# Patient Record
Sex: Male | Born: 1945
Health system: Southern US, Community
[De-identification: ages and names within clinical notes are randomized; demographics above are authoritative.]

## PROBLEM LIST (undated history)

## (undated) DIAGNOSIS — C7B8 Other secondary neuroendocrine tumors: Secondary | ICD-10-CM

## (undated) DIAGNOSIS — K0889 Other specified disorders of teeth and supporting structures: Secondary | ICD-10-CM

## (undated) DIAGNOSIS — I2510 Atherosclerotic heart disease of native coronary artery without angina pectoris: Secondary | ICD-10-CM

## (undated) DIAGNOSIS — R110 Nausea: Secondary | ICD-10-CM

## (undated) DIAGNOSIS — I509 Heart failure, unspecified: Secondary | ICD-10-CM

## (undated) DIAGNOSIS — E3400 Carcinoid syndrome, unspecified: Secondary | ICD-10-CM

## (undated) DIAGNOSIS — I10 Essential (primary) hypertension: Secondary | ICD-10-CM

## (undated) DIAGNOSIS — M109 Gout, unspecified: Secondary | ICD-10-CM

## (undated) DIAGNOSIS — I639 Cerebral infarction, unspecified: Secondary | ICD-10-CM

## (undated) DIAGNOSIS — J40 Bronchitis, not specified as acute or chronic: Secondary | ICD-10-CM

## (undated) DIAGNOSIS — C787 Secondary malignant neoplasm of liver and intrahepatic bile duct: Secondary | ICD-10-CM

## (undated) DIAGNOSIS — R109 Unspecified abdominal pain: Secondary | ICD-10-CM

## (undated) HISTORY — PX: INGUINAL HERNIA REPAIR: SUR1180

## (undated) HISTORY — DX: Other specified disorders of teeth and supporting structures: K08.89

## (undated) HISTORY — DX: Unspecified abdominal pain: R10.9

## (undated) HISTORY — DX: Nausea: R11.0

## (undated) HISTORY — DX: Essential (primary) hypertension: I10

## (undated) HISTORY — DX: Carcinoid syndrome, unspecified: E34.00

## (undated) HISTORY — PX: SINUS SURGERY WITH INSTATRAK: SHX5215

## (undated) HISTORY — DX: Bronchitis, not specified as acute or chronic: J40

## (undated) HISTORY — DX: Carcinoid syndrome: E34.0

## (undated) HISTORY — PX: CARDIAC DEFIBRILLATOR PLACEMENT: SHX171

## (undated) HISTORY — DX: Secondary malignant neoplasm of liver and intrahepatic bile duct: C78.7

## (undated) HISTORY — DX: Other secondary neuroendocrine tumors: C7B.8

## (undated) HISTORY — DX: Atherosclerotic heart disease of native coronary artery without angina pectoris: I25.10

---

## 1978-03-18 HISTORY — PX: COLONOSCOPY: SHX174

## 1998-03-18 DIAGNOSIS — I2510 Atherosclerotic heart disease of native coronary artery without angina pectoris: Secondary | ICD-10-CM

## 1998-03-18 HISTORY — DX: Atherosclerotic heart disease of native coronary artery without angina pectoris: I25.10

## 1999-09-19 ENCOUNTER — Inpatient Hospital Stay (HOSPITAL_COMMUNITY): Admission: EM | Admit: 1999-09-19 | Discharge: 1999-09-21 | Payer: Self-pay | Admitting: *Deleted

## 2009-03-18 DIAGNOSIS — I639 Cerebral infarction, unspecified: Secondary | ICD-10-CM

## 2009-03-18 HISTORY — DX: Cerebral infarction, unspecified: I63.9

## 2012-04-12 ENCOUNTER — Encounter (HOSPITAL_COMMUNITY): Payer: Self-pay | Admitting: Emergency Medicine

## 2012-04-12 ENCOUNTER — Ambulatory Visit: Payer: Self-pay | Admitting: Family Medicine

## 2012-04-12 ENCOUNTER — Inpatient Hospital Stay (HOSPITAL_COMMUNITY): Payer: Medicaid Other

## 2012-04-12 ENCOUNTER — Emergency Department (HOSPITAL_COMMUNITY): Payer: Medicaid Other

## 2012-04-12 ENCOUNTER — Inpatient Hospital Stay (HOSPITAL_COMMUNITY)
Admission: EM | Admit: 2012-04-12 | Discharge: 2012-04-16 | DRG: 194 | Disposition: A | Discharge status: Home / Self Care | Payer: Medicaid Other | Attending: Internal Medicine | Admitting: Internal Medicine

## 2012-04-12 VITALS — BP 78/62 | HR 74 | Temp 99.1°F | Resp 30 | Ht 69.25 in | Wt 188.0 lb

## 2012-04-12 DIAGNOSIS — K5900 Constipation, unspecified: Secondary | ICD-10-CM | POA: Diagnosis not present

## 2012-04-12 DIAGNOSIS — K7689 Other specified diseases of liver: Secondary | ICD-10-CM | POA: Diagnosis present

## 2012-04-12 DIAGNOSIS — I959 Hypotension, unspecified: Secondary | ICD-10-CM

## 2012-04-12 DIAGNOSIS — N179 Acute kidney failure, unspecified: Secondary | ICD-10-CM

## 2012-04-12 DIAGNOSIS — I255 Ischemic cardiomyopathy: Secondary | ICD-10-CM

## 2012-04-12 DIAGNOSIS — J189 Pneumonia, unspecified organism: Principal | ICD-10-CM

## 2012-04-12 DIAGNOSIS — R791 Abnormal coagulation profile: Secondary | ICD-10-CM

## 2012-04-12 DIAGNOSIS — R160 Hepatomegaly, not elsewhere classified: Secondary | ICD-10-CM

## 2012-04-12 DIAGNOSIS — R7989 Other specified abnormal findings of blood chemistry: Secondary | ICD-10-CM

## 2012-04-12 DIAGNOSIS — E869 Volume depletion, unspecified: Secondary | ICD-10-CM

## 2012-04-12 DIAGNOSIS — D72829 Elevated white blood cell count, unspecified: Secondary | ICD-10-CM

## 2012-04-12 DIAGNOSIS — C7B8 Other secondary neuroendocrine tumors: Secondary | ICD-10-CM

## 2012-04-12 DIAGNOSIS — Z7901 Long term (current) use of anticoagulants: Secondary | ICD-10-CM

## 2012-04-12 DIAGNOSIS — I2589 Other forms of chronic ischemic heart disease: Secondary | ICD-10-CM | POA: Diagnosis present

## 2012-04-12 DIAGNOSIS — R1031 Right lower quadrant pain: Secondary | ICD-10-CM

## 2012-04-12 DIAGNOSIS — R9431 Abnormal electrocardiogram [ECG] [EKG]: Secondary | ICD-10-CM

## 2012-04-12 DIAGNOSIS — Z95810 Presence of automatic (implantable) cardiac defibrillator: Secondary | ICD-10-CM

## 2012-04-12 DIAGNOSIS — Z8673 Personal history of transient ischemic attack (TIA), and cerebral infarction without residual deficits: Secondary | ICD-10-CM

## 2012-04-12 DIAGNOSIS — I509 Heart failure, unspecified: Secondary | ICD-10-CM | POA: Diagnosis present

## 2012-04-12 DIAGNOSIS — Z79899 Other long term (current) drug therapy: Secondary | ICD-10-CM

## 2012-04-12 DIAGNOSIS — R197 Diarrhea, unspecified: Secondary | ICD-10-CM

## 2012-04-12 DIAGNOSIS — R0602 Shortness of breath: Secondary | ICD-10-CM | POA: Diagnosis present

## 2012-04-12 HISTORY — DX: Heart failure, unspecified: I50.9

## 2012-04-12 HISTORY — DX: Cerebral infarction, unspecified: I63.9

## 2012-04-12 LAB — CBC WITH DIFFERENTIAL/PLATELET
Basophils Absolute: 0 10*3/uL (ref 0.0–0.1)
Basophils Relative: 0 % (ref 0–1)
Eosinophils Absolute: 0 10*3/uL (ref 0.0–0.7)
Eosinophils Relative: 0 % (ref 0–5)
HCT: 39 % (ref 39.0–52.0)
Hemoglobin: 14.2 g/dL (ref 13.0–17.0)
Lymphocytes Relative: 6 % — ABNORMAL LOW (ref 12–46)
Lymphs Abs: 1.1 10*3/uL (ref 0.7–4.0)
MCH: 32.6 pg (ref 26.0–34.0)
MCHC: 36.4 g/dL — ABNORMAL HIGH (ref 30.0–36.0)
MCV: 89.7 fL (ref 78.0–100.0)
Monocytes Absolute: 3.5 10*3/uL — ABNORMAL HIGH (ref 0.1–1.0)
Monocytes Relative: 19 % — ABNORMAL HIGH (ref 3–12)
Neutro Abs: 14 10*3/uL — ABNORMAL HIGH (ref 1.7–7.7)
Neutrophils Relative %: 75 % (ref 43–77)
Platelets: 165 10*3/uL (ref 150–400)
RBC: 4.35 MIL/uL (ref 4.22–5.81)
RDW: 14.7 % (ref 11.5–15.5)
WBC: 18.6 10*3/uL — ABNORMAL HIGH (ref 4.0–10.5)

## 2012-04-12 LAB — URINALYSIS, ROUTINE W REFLEX MICROSCOPIC
Bilirubin Urine: NEGATIVE
Ketones, ur: NEGATIVE mg/dL
Nitrite: NEGATIVE
Urobilinogen, UA: 1 mg/dL (ref 0.0–1.0)

## 2012-04-12 LAB — COMPREHENSIVE METABOLIC PANEL
ALT: 48 U/L (ref 0–53)
AST: 75 U/L — ABNORMAL HIGH (ref 0–37)
CO2: 22 mEq/L (ref 19–32)
Chloride: 98 mEq/L (ref 96–112)
Creatinine, Ser: 2.54 mg/dL — ABNORMAL HIGH (ref 0.50–1.35)
GFR calc Af Amer: 29 mL/min — ABNORMAL LOW (ref >90)
GFR calc non Af Amer: 25 mL/min — ABNORMAL LOW (ref >90)
Glucose, Bld: 134 mg/dL — ABNORMAL HIGH (ref 70–99)
Total Bilirubin: 1.4 mg/dL — ABNORMAL HIGH (ref 0.3–1.2)

## 2012-04-12 LAB — URINE MICROSCOPIC-ADD ON

## 2012-04-12 LAB — LIPASE, BLOOD: Lipase: 17 U/L (ref 11–59)

## 2012-04-12 LAB — RAPID URINE DRUG SCREEN, HOSP PERFORMED
Amphetamines: NOT DETECTED
Barbiturates: NOT DETECTED
Benzodiazepines: NOT DETECTED
Cocaine: NOT DETECTED
Opiates: NOT DETECTED
Tetrahydrocannabinol: NOT DETECTED

## 2012-04-12 LAB — POCT I-STAT TROPONIN I: Troponin i, poc: 0.02 ng/mL (ref 0.00–0.08)

## 2012-04-12 LAB — PHOSPHORUS: Phosphorus: 3 mg/dL (ref 2.3–4.6)

## 2012-04-12 LAB — PRO B NATRIURETIC PEPTIDE: Pro B Natriuretic peptide (BNP): 771.7 pg/mL — ABNORMAL HIGH (ref 0–125)

## 2012-04-12 LAB — SODIUM, URINE, RANDOM: Sodium, Ur: 34 mEq/L

## 2012-04-12 LAB — PROTIME-INR: Prothrombin Time: 34.4 seconds — ABNORMAL HIGH (ref 11.6–15.2)

## 2012-04-12 MED ORDER — LEVOFLOXACIN IN D5W 750 MG/150ML IV SOLN: 750.0000 mg | INTRAVENOUS | Status: DC

## 2012-04-12 MED ORDER — ACETAMINOPHEN 325 MG PO TABS
650.0000 mg | ORAL_TABLET | Freq: Four times a day (QID) | ORAL | Status: DC | PRN
Start: 2012-04-12 — End: 2012-04-16
  Administered 2012-04-13: 650 mg via ORAL
  Filled 2012-04-12: qty 2

## 2012-04-12 MED ORDER — OSELTAMIVIR PHOSPHATE 75 MG PO CAPS
75.0000 mg | ORAL_CAPSULE | Freq: Every day | ORAL | Status: DC
  Administered 2012-04-12 – 2012-04-13 (×2): 75 mg via ORAL
  Filled 2012-04-12 (×3): qty 1

## 2012-04-12 MED ORDER — SODIUM CHLORIDE 0.9 % IV SOLN
INTRAVENOUS | Status: DC
  Administered 2012-04-12 (×2): via INTRAVENOUS

## 2012-04-12 MED ORDER — SODIUM CHLORIDE 0.9 % IV BOLUS (SEPSIS)
1000.0000 mL | Freq: Once | INTRAVENOUS | Status: AC
  Administered 2012-04-12: 1000 mL via INTRAVENOUS

## 2012-04-12 MED ORDER — SODIUM CHLORIDE 0.9 % IJ SOLN
3.0000 mL | Freq: Two times a day (BID) | INTRAMUSCULAR | Status: DC
  Administered 2012-04-13 – 2012-04-16 (×5): 3 mL via INTRAVENOUS

## 2012-04-12 MED ORDER — LEVOFLOXACIN IN D5W 750 MG/150ML IV SOLN
750.0000 mg | INTRAVENOUS | Status: DC
  Administered 2012-04-14: 750 mg via INTRAVENOUS
  Filled 2012-04-12: qty 150

## 2012-04-12 MED ORDER — ACETAMINOPHEN 650 MG RE SUPP: 650.0000 mg | Freq: Four times a day (QID) | RECTAL | Status: DC | PRN

## 2012-04-12 MED ORDER — BIOTENE DRY MOUTH MT LIQD
15.0000 mL | Freq: Two times a day (BID) | OROMUCOSAL | Status: DC
  Administered 2012-04-12 – 2012-04-16 (×7): 15 mL via OROMUCOSAL

## 2012-04-12 MED ORDER — ASPIRIN EC 81 MG PO TBEC
81.0000 mg | DELAYED_RELEASE_TABLET | Freq: Every day | ORAL | Status: DC
  Administered 2012-04-12 – 2012-04-16 (×5): 81 mg via ORAL
  Filled 2012-04-12 (×5): qty 1

## 2012-04-12 MED ORDER — ONDANSETRON HCL 4 MG PO TABS: 4.0000 mg | ORAL_TABLET | Freq: Four times a day (QID) | ORAL | Status: DC | PRN

## 2012-04-12 MED ORDER — LEVOFLOXACIN IN D5W 750 MG/150ML IV SOLN
750.0000 mg | Freq: Once | INTRAVENOUS | Status: AC
  Administered 2012-04-12: 750 mg via INTRAVENOUS
  Filled 2012-04-12: qty 150

## 2012-04-12 MED ORDER — ONDANSETRON HCL 4 MG/2ML IJ SOLN
4.0000 mg | Freq: Four times a day (QID) | INTRAMUSCULAR | Status: DC | PRN
  Administered 2012-04-14: 4 mg via INTRAVENOUS
  Filled 2012-04-12: qty 2

## 2012-04-12 NOTE — H&P (Signed)
Hospital Admission Note Date: 04/12/2012  Patient name: Jonathan Reed Medical record number: [id number] Date of birth: Apr 25, 1945 Age: 67 y.o. Gender: male PCP: Karle Plumber, MD  Medical Service: Internal Medicine Teaching Service  Attending physician:  Dr. Kem Kays   1st Contact:  Dr. Garald Braver  Pager:[phone] 2nd Contact:  Dr. Dorise Hiss  Pager:[phone] After 5 pm or weekends: 1st Contact:      Pager: [phone] 2nd Contact:      Pager: [phone]  Chief Complaint: Jonathan Reed, decreased appetite  History of Present Illness: Jonathan Reed is a 67 year-old man with PMH of CHF ischemic CM, s/p ICD and pacemaker, and CVA who comes in to the [facility] ED for evaluation of 5 days of malaise with decreased appetite. He states that he was in his usual state of health until Wednesday when he started feeling weak. He has progressively worsened with subjective fever, nausea, dizziness, and diarrhea. In addition he reports having a "funny feeling" in the epigastric area and dyspnea on exertion for the past 3-5 days. The diarrhea lasted for a few days with 1-2 episodes per day but subsided yesterday but he continued to have poor intake per mouth. He states that he has not been walking around for days now as he feels so tired. He reports being in contact with someone that had the flu. He did not receive the flu vaccine during this flu season. He was seen earlier today at the [facility] and was found to have BP of 78/62 and was sent to the [facility] ED via EMS after fluid resuscitation was started. His BP on his arrival to the ED was 104/79. He received additional IVF.  Of note, he is on spironolactone, metoprolol ER, and lisinopril-HCTZ at home and states that he has been taking all these medications the past days.   He denies cough, chest pain, dysuria, urinary retention, hematochezia, or melena.   Meds: Current Outpatient Rx  Name  Route  Sig  Dispense  Refill  . LISINOPRIL-HYDROCHLOROTHIAZIDE 20-25 MG PO TABS   Oral  Take 1 tablet by mouth daily.         Marland Kitchen METOPROLOL SUCCINATE ER 25 MG PO TB24   Oral   Take 25 mg by mouth daily.         Marland Kitchen SPIRONOLACTONE 25 MG PO TABS   Oral   Take 25 mg by mouth daily.         . WARFARIN SODIUM 5 MG PO TABS   Oral   Take 5 mg by mouth daily.           Allergies: Allergies as of 04/12/2012  . (No Known Allergies)   Past Medical History  Diagnosis Date  . CHF (congestive heart failure)   . CVA (cerebral infarction) 2011    No residual Sx  . Stroke 2011   History reviewed. No pertinent past surgical history. History reviewed. No pertinent family history. History   Social History  . Marital Status: Married    Spouse Name: N/A    Number of Children: N/A  . Years of Education: N/A   Occupational History  . Not on file.   Social History Main Topics  . Smoking status: Not on file  . Smokeless tobacco: Not on file  . Alcohol Use:   . Drug Use:   . Sexually Active:    Other Topics Concern  . Not on file   Social History Narrative  . No narrative on file    Review  of Systems: Pertinent items are noted in HPI.  Physical Exam: Blood pressure 102/63, pulse 84, temperature 99.3 F (37.4 C), temperature source Oral, resp. rate 24, SpO2 99.00%. Vitals reviewed. General: resting in bed, in NAD, his wife at his bedside HEENT: Wearing bifocals, PERRL, no scleral icterus Cardiac: irregular rhythm with occasionally dropped beats, no rubs, murmurs or gallops Chest: ICD implant noted on left chest Pulm: clear to auscultation bilaterally, no wheezes, rales, or rhonchi Abd: obese, soft, nontender, mildly distended, no guarding or rebound tenderness, normal BS present Ext: warm and well perfused, no pedal edema Neuro: alert and oriented X3, cranial nerves II-XII grossly intact, strength and sensation to light touch equal in bilateral upper and lower extremities  Lab results: Basic Metabolic Panel:  Basename 04/12/12 1254  NA 134*  K 5.0    CL 98  CO2 22  GLUCOSE 134*  BUN 49*  CREATININE 2.54*  CALCIUM 9.4  MG --  PHOS --   Liver Function Tests:  Basename 04/12/12 1254  AST 75*  ALT 48  ALKPHOS 179*  BILITOT 1.4*  PROT 7.8  ALBUMIN 3.0*    Basename 04/12/12 1254  LIPASE 17  AMYLASE --   CBC:  Basename 04/12/12 1254  WBC 18.6*  NEUTROABS 14.0*  HGB 14.2  HCT 39.0  MCV 89.7  PLT 165   CImaging results:  Dg Chest 2 View  04/12/2012  *RADIOLOGY REPORT*  Clinical Data: Weakness, fever  CHEST - 2 VIEW  Comparison: 04/12/2012  Findings: Cardiomediastinal silhouette is unremarkable.  Elevation of the right hemidiaphragm.  Three leads cardiac pacemaker in place.  No pulmonary edema.  There is right basilar atelectasis or infiltrate.  IMPRESSION: Elevation of the right hemidiaphragm.  Right basilar atelectasis or infiltrate. Follow-up examination to assure resolution is recommended.   Original Report Authenticated By: Natasha Mead, M.D.     Other results: EKG: Tachycardia with 100b/min, paced rhythm with PVCs noted   Assessment & Plan by Problem:  Shortness of breath. Likely multifactorial. His CXR is remarkable for right basilar atelectasis or infiltrate but no edema, however, concerning for PNA. In addition, he has leukocytosis and subjective fever further supporting the diagnosis of PNA. Influenza infection also a possibility given his lack of vaccination, recent exposure to someone with the flu, and his current symptoms. Clinically he does not appear volume overloaded, making CHF exacerbation less likely, but he does have a distended abdomen. His CXR is also remarkable for and elevated right hemidiaphragm and his LFTs are abnormal, liver failure with possible abdominal ascites is a possible etiology for his shortness of breath. ACS is also of concern given his hx of CAD and pacemaker/ICD implant but he denies chest pain, his EKG does not show ST changes and his ISTAT troponin is 0.02. Finally, PE is also considered  but less likely given that he is on chronic warfarin therapy.  -Admit to telemetry -pro-BNP ordered -PNA treatment per below -Influenza nasal PCR -Tamiflu (renally dosed) -Droplet precautions -Zofran PRN -UDS  Pneumonia, community acquired pneumonia. He has had subjective fever for days with decreased intake per mouth, his CXR shows right basilar atelectasis or infiltrate. Although his physical exam is benign, his symptoms at his presentation are concerning for CAP and he will be treated empirically. He has received IV Levaquin while in the ED.  -Continue IV levaquin while patient has nausea (dosed per pharmacy) -f/u blood cultures x2 -f/u sputum culture  -urine Strep pneumoniae and Legionella antigen  Leukocytosis. WBC of 18.6 on  admission. Likely 2/2 to CAP per above. He is afebrile but with temp of 99.11F in the ED.  -UA -Urine culture -CBC daily  Elevated LFTs. Alk phos of 179, AST 75, and t bili at 1.4 on admission. He denies drinking alcohol or using illicit drugs. He has not history of traveling outside the [place]. He has had poor intake per mouth, SOB, distended abdomen, anorexia, diarrhea which are symptoms concerning for acute hepatitis.  -Hepatitis panel -CMP in AM -PT/INR -Abdominal US, complete -HIV testing  Acute Renal Failure. Baseline creatinine unknown but creatinine on admission at 2.54 with BUN 49, bicarb 22, and K of 5.0. Most likely prerenal azotemia in the setting of creased intake per mouth for 5 days. He denies urinary retention of dysuria.  -Strict I&O -Abdomen US complete to include renal study -CBM in AM -Mg/phos levels ordered -IVF hydration with NS@100ml /hr -Urine Na and creatinine for FeNa calculation -Hgb A1C  Hypotension. BP of 78/62 in Urgent Care, up to 106/83 with 1L NS bolus. He is on metoprolol ER, lisinopril-HCTZ, and spironolactone at home and assures compliance with these medications even while sick.  -Hold all antihypertensives at this  time -IVF with   CHF/ischemic cardiomyopathy/ICD pacemaker. He does not appear volume overloaded on exam with no edema on CXR. No 2D echo on file, he has not seen a Cardiologist in years. He has PVCs on EKG but no active chest pain. He reports that his ICD pacemaker has not fired in years.  -Telemetry -Attempt to obtain records from PCP -Will consider 2D echo  Anticoagulation therapy. He is on coumadin which is managed by his PCP. It is unclear why he is on this therapy, he thinks it is related to his stroke years ago. His INR is 3.68.  -Continue warfarin per pharmacy--hold in the setting of supratherapeutic INR -PT/INR in AM -Will contact his PCP for anti-coagulation therapy clarification  DVT prophylaxis. Currently on coumadin with INR of 3.68  FEN: NS 176ml/hr          Replete as needed          Regular diet  Dispo: Disposition is deferred at this time, awaiting improvement of current medical problems. Anticipated discharge in approximately 2-3 day(s).   The patient does have a current PCP Karle Plumber, MD), therefore will not be requiring OPC follow-up after discharge.   The patient does not have transportation limitations that hinder transportation to clinic appointments.  Signed: Ky Barban 04/12/2012, 3:04 PM

## 2012-04-12 NOTE — ED Notes (Signed)
BIB [facility]. Patient presented at Urgent care [place] this am "did not feel well". CO n/v/d x2 days and abdominal pain. No complaints of pain at this time. No CP, SOB Transport: VSS, pacer rhythm w/trigemeny.

## 2012-04-12 NOTE — Progress Notes (Signed)
Pt arrived to unit from ED. Alert and oriented. No complaints of pain, just weakness and general malaise. Pt oriented to unit. VSS. Bed in low position. Call bell within reach. Will continue to monitor.

## 2012-04-12 NOTE — Progress Notes (Signed)
  Subjective:    Patient ID: Jonathan Reed, male    DOB: 1946-02-19, 67 y.o.   MRN: [id number]  HPI Jonathan Reed is a 67 y.o. male brought back emergently due to hypotension Unable to obtain blood pressure at check in. Manual BP at 1107 78/62 R arm.  Here with wife. Has not felt well past 3-4 days - dyspnea, weakness, decreased appetite and intake of food and fluids. Has had some lower R abdominal pain and diarrhea yesterday once after taking epsom salt as laxative few days ago.  Dry heaving since yesterday.  Less fluids.  No chest pains.  Unknown if fever - but sweating past 3 days. Dizzy.  Has taken his blood pressure medicines.   Hx of CHF and ischemic cardiomyopathy. Has defibrillator - but no discharges in past few days.   Review of Systems As above.     Objective:   Physical Exam  Vitals reviewed. Constitutional: He is oriented to person, place, and time. No distress.       Appears fatigued, but nontoxic or distress.   HENT:  Head: Normocephalic and atraumatic.  Eyes:       ?slight yellowing of sclera bilaterally.   Cardiovascular: Normal rate and normal pulses.   Occasional extrasystoles are present.       Rate 60-70, with intermittent ectopy. Distal pulses intact.   Pulmonary/Chest: Effort normal and breath sounds normal.       No appreciable rales, but slight tachypnea. bs heard in all lung fields.   Abdominal: Soft. He exhibits no distension, no abdominal bruit and no pulsatile midline mass (no palpable mass appreciated. ). There is tenderness in the right lower quadrant. There is no rigidity, no rebound and no guarding.  Neurological: He is alert and oriented to person, place, and time.  Skin: Skin is warm and dry.  Psychiatric: He has a normal mood and affect.  intial BP 78/62, recheck after IV in place - 100/78.  EKG: sr with frequent ectopy - every 1/3rd beat, old anterior infarct.      Assessment & Plan:  Jonathan Reed is a 67 y.o. male 1. Hypotension  EKG  12-Lead  2. Volume depletion    3. Abnormal EKG    4. Diarrhea    5. RLQ abdominal pain     Possible hypotension with volume depletion, dry heaving, shortness of breath by hx with hx of ischemic cardiomyopathy, and defibrillator. Abd pain, but no palpable pulsatile mass.   18 gauge IV placed with NS wide open, recheck BP 100/78, O2 [place] at 2 liters placed. Monitor placed, EMS called for transport, transition of care to EMS at 1118. Advised charge nurse at [facility] of patient en route.

## 2012-04-12 NOTE — Progress Notes (Signed)
ANTIBIOTIC CONSULT NOTE - INITIAL  Pharmacy Consult for levofloxacin Indication: rule out pneumonia  No Known Allergies  Patient Measurements: Height: 5' 9.29" (176 cm) Weight: 188 lb 0.8 oz (85.3 kg) IBW/kg (Calculated) : 71.37  Adjusted Body Weight:   Vital Signs: Temp: 98.2 F (36.8 C) (01/26 1546) Temp src: Oral (01/26 1546) BP: 108/64 mmHg (01/26 1546) Pulse Rate: 83  (01/26 1515) Intake/Output from previous day:   Intake/Output from this shift:    Labs:  Basename 04/12/12 1254  WBC 18.6*  HGB 14.2  PLT 165  LABCREA --  CREATININE 2.54*   Estimated Creatinine Clearance: 29.7 ml/min (by C-G formula based on Cr of 2.54). No results found for this basename: VANCOTROUGH:2,VANCOPEAK:2,VANCORANDOM:2,GENTTROUGH:2,GENTPEAK:2,GENTRANDOM:2,TOBRATROUGH:2,TOBRAPEAK:2,TOBRARND:2,AMIKACINPEAK:2,AMIKACINTROU:2,AMIKACIN:2, in the last 72 hours   Microbiology: No results found for this or any previous visit (from the past 720 hour(s)).  Medical History: Past Medical History  Diagnosis Date  . CHF (congestive heart failure)   . CVA (cerebral infarction) 2011    No residual Sx  . Stroke 2011    Medications:  Scheduled:    . aspirin EC  81 mg Oral Daily  . [COMPLETED] levofloxacin (LEVAQUIN) IV  750 mg Intravenous Once  . levofloxacin (LEVAQUIN) IV  750 mg Intravenous Q48H  . oseltamivir  75 mg Oral Daily  . [COMPLETED] sodium chloride  1,000 mL Intravenous Once  . sodium chloride  3 mL Intravenous Q12H  . [DISCONTINUED] levofloxacin (LEVAQUIN) IV  750 mg Intravenous Q24H   Assessment: 67 yr old male being treated empirically for CAP. He got levofloxacin 750 mg IV in the ED. Pharmacy to dose further for renal function. CrCl is 29 ml   Plan:  Levaquin 750 mg IV q48 hrs. Follow renal function and adjust as needed.   Eugene Garnet 04/12/2012,6:00 PM

## 2012-04-12 NOTE — ED Provider Notes (Signed)
Medical screening examination/treatment/procedure(s) were conducted as a shared visit with non-physician practitioner(s) and myself.  I personally evaluated the patient during the encounter.  Pt examined.  He appears comfortable at rest but has significant dyspnea with minimal activity.  Not diminished RA O2 sat also.  Pt will be admitted for further evaluation.  Tobin Chad, MD 04/12/12 1539

## 2012-04-12 NOTE — ED Provider Notes (Signed)
History     CSN: [id number]  Arrival date & time 04/12/12  1205   First MD Initiated Contact with Patient 04/12/12 1208      Chief Complaint  Patient presents with  . Fatigue    (Consider location/radiation/quality/duration/timing/severity/associated sxs/prior treatment) HPI Comments: 67 year old male presents to the emergency department complaining of "not feeling well" for the past 2 days. He went to [facility] urgent care earlier this morning and was advised to come to the emergency department for evaluation. Admits to bodyaches and pains, abdominal pain, nausea, vomiting and diarrhea. States he feels as if he had fever or chills. Admits to shortness of breath on exertion, however denies chest pain. States people around him of the flu. He did not have his flu vaccine this year. He has not tried any alleviating factors for his symptoms.  The history is provided by the patient.    Past Medical History  Diagnosis Date  . CHF (congestive heart failure)   . CVA (cerebral infarction) 2011    No residual Sx  . Stroke 2011    History reviewed. No pertinent past surgical history.  History reviewed. No pertinent family history.  History  Substance Use Topics  . Smoking status: Not on file  . Smokeless tobacco: Not on file  . Alcohol Use:       Review of Systems  Constitutional: Positive for fever, chills, appetite change and fatigue.  HENT: Negative for sore throat, neck pain and neck stiffness.   Respiratory: Positive for cough and shortness of breath.   Cardiovascular: Negative for chest pain.  Gastrointestinal: Positive for nausea, vomiting and diarrhea. Negative for abdominal pain.  Musculoskeletal: Positive for myalgias and arthralgias.  Skin: Negative for rash.  Neurological: Positive for weakness. Negative for dizziness, syncope and light-headedness.  All other systems reviewed and are negative.    Allergies  Review of patient's allergies indicates no known  allergies.  Home Medications   Current Outpatient Rx  Name  Route  Sig  Dispense  Refill  . LISINOPRIL-HYDROCHLOROTHIAZIDE 20-25 MG PO TABS   Oral   Take 1 tablet by mouth daily.         Marland Kitchen METOPROLOL SUCCINATE ER 25 MG PO TB24   Oral   Take 25 mg by mouth daily.         Marland Kitchen SPIRONOLACTONE 25 MG PO TABS   Oral   Take 25 mg by mouth daily.         . WARFARIN SODIUM 5 MG PO TABS   Oral   Take 5 mg by mouth daily.           BP 107/68  Temp 99.8 F (37.7 C) (Oral)  Resp 14  SpO2 98%  Physical Exam  Nursing note and vitals reviewed. Constitutional: He is oriented to person, place, and time. He appears well-developed and well-nourished. No distress. Nasal cannula in place.       Appears fatigued.  HENT:  Head: Normocephalic and atraumatic.  Mouth/Throat: Mucous membranes are normal. Posterior oropharyngeal erythema present. No oropharyngeal exudate or posterior oropharyngeal edema.  Eyes: Conjunctivae normal and EOM are normal. Pupils are equal, round, and reactive to light.  Neck: Normal range of motion. Neck supple.  Cardiovascular: Normal rate, regular rhythm, normal heart sounds and intact distal pulses.   Pulmonary/Chest: Effort normal. No respiratory distress. He has no decreased breath sounds. He has no wheezes. He has rhonchi in the right middle field and the right lower field. He has no rales.  Abdominal: Soft. Normal appearance and bowel sounds are normal. There is tenderness in the epigastric area. There is guarding. There is no rigidity and no rebound.  Musculoskeletal: Normal range of motion. He exhibits no edema.  Lymphadenopathy:    He has no cervical adenopathy.  Neurological: He is alert and oriented to person, place, and time.  Skin: Skin is warm and dry.  Psychiatric: He has a normal mood and affect. His behavior is normal.    ED Course  Procedures (including critical care time)  Labs Reviewed  CBC WITH DIFFERENTIAL - Abnormal; Notable for the  following:    WBC 18.6 (*)     MCHC 36.4 (*)     Neutro Abs 14.0 (*)     Lymphocytes Relative 6 (*)     Monocytes Relative 19 (*)     Monocytes Absolute 3.5 (*)     All other components within normal limits  COMPREHENSIVE METABOLIC PANEL - Abnormal; Notable for the following:    Sodium 134 (*)     Glucose, Bld 134 (*)     BUN 49 (*)     Creatinine, Ser 2.54 (*)     Albumin 3.0 (*)     AST 75 (*)     Alkaline Phosphatase 179 (*)     Total Bilirubin 1.4 (*)     GFR calc non Af Amer 25 (*)     GFR calc Af Amer 29 (*)     All other components within normal limits  LIPASE, BLOOD   Dg Chest 2 View  04/12/2012  *RADIOLOGY REPORT*  Clinical Data: Weakness, fever  CHEST - 2 VIEW  Comparison: 04/12/2012  Findings: Cardiomediastinal silhouette is unremarkable.  Elevation of the right hemidiaphragm.  Three leads cardiac pacemaker in place.  No pulmonary edema.  There is right basilar atelectasis or infiltrate.  IMPRESSION: Elevation of the right hemidiaphragm.  Right basilar atelectasis or infiltrate. Follow-up examination to assure resolution is recommended.   Original Report Authenticated By: Natasha Mead, M.D.      1. CAP (community acquired pneumonia)       MDM  67 y/o male with evidence of infiltrate in R lung consistent with physical exam where ronchi were present. WBC 18.6. IV levaquin started. O2 sat 90% on RA while walking around ED. PORT score 84 points. Initially tachypnea with resp of 30, decreasing to 17 with nasal cannula. He will be admitted to internal medicine teaching service. Case discussed with Dr. Lorenso Courier who agrees with plan of care.       Trevor Mace, PA-C 04/12/12 1415

## 2012-04-12 NOTE — Progress Notes (Signed)
ANTICOAGULATION CONSULT NOTE - Initial Consult  Pharmacy Consult for coumadin Indication: stroke  No Known Allergies  Patient Measurements: Height: 5' 9.29" (176 cm) Weight: 188 lb 0.8 oz (85.3 kg) IBW/kg (Calculated) : 71.37  Heparin Dosing Weight:   Vital Signs: Temp: 98.2 F (36.8 C) (01/26 1546) Temp src: Oral (01/26 1546) BP: 108/64 mmHg (01/26 1546) Pulse Rate: 83  (01/26 1515)  Labs:  Basename 04/12/12 1505 04/12/12 1254  HGB -- 14.2  HCT -- 39.0  PLT -- 165  APTT -- --  LABPROT 34.4* --  INR 3.68* --  HEPARINUNFRC -- --  CREATININE -- 2.54*  CKTOTAL -- --  CKMB -- --  TROPONINI -- --    Estimated Creatinine Clearance: 29.7 ml/min (by C-G formula based on Cr of 2.54).   Medical History: Past Medical History  Diagnosis Date  . CHF (congestive heart failure)   . CVA (cerebral infarction) 2011    No residual Sx  . Stroke 2011    Medications:  Scheduled:    . aspirin EC  81 mg Oral Daily  . [COMPLETED] levofloxacin (LEVAQUIN) IV  750 mg Intravenous Once  . levofloxacin (LEVAQUIN) IV  750 mg Intravenous Q48H  . oseltamivir  75 mg Oral Daily  . [COMPLETED] sodium chloride  1,000 mL Intravenous Once  . sodium chloride  3 mL Intravenous Q12H  . [DISCONTINUED] levofloxacin (LEVAQUIN) IV  750 mg Intravenous Q24H    Assessment:  67 yr old male sent to the ED by EMS from Urgent Care where he was seen for hypotension and weakness. He takes coumadin chronically for stroke. His INR is 3.68 today. He takes 5 mg tablets. Goal of Therapy:  INR 2-3    Plan:  No coumadin today (INR 3.68) Daily PT/INR  Eugene Garnet 04/12/2012,5:54 PM

## 2012-04-13 ENCOUNTER — Inpatient Hospital Stay (HOSPITAL_COMMUNITY): Payer: Medicaid Other

## 2012-04-13 DIAGNOSIS — R1900 Intra-abdominal and pelvic swelling, mass and lump, unspecified site: Secondary | ICD-10-CM

## 2012-04-13 LAB — COMPREHENSIVE METABOLIC PANEL
ALT: 42 U/L (ref 0–53)
AST: 57 U/L — ABNORMAL HIGH (ref 0–37)
CO2: 21 mEq/L (ref 19–32)
Chloride: 101 mEq/L (ref 96–112)
Creatinine, Ser: 1.66 mg/dL — ABNORMAL HIGH (ref 0.50–1.35)
GFR calc Af Amer: 49 mL/min — ABNORMAL LOW (ref >90)
GFR calc non Af Amer: 42 mL/min — ABNORMAL LOW (ref >90)
Glucose, Bld: 97 mg/dL (ref 70–99)
Total Bilirubin: 1 mg/dL (ref 0.3–1.2)

## 2012-04-13 LAB — CBC
MCH: 32.4 pg (ref 26.0–34.0)
MCHC: 36.2 g/dL — ABNORMAL HIGH (ref 30.0–36.0)
Platelets: 170 10*3/uL (ref 150–400)
RBC: 3.92 MIL/uL — ABNORMAL LOW (ref 4.22–5.81)

## 2012-04-13 LAB — LEGIONELLA ANTIGEN, URINE

## 2012-04-13 LAB — HEMOGLOBIN A1C: Mean Plasma Glucose: 140 mg/dL — ABNORMAL HIGH (ref <117)

## 2012-04-13 LAB — PROTIME-INR
INR: 5.85 (ref 0.00–1.49)
Prothrombin Time: 48.3 seconds — ABNORMAL HIGH (ref 11.6–15.2)

## 2012-04-13 LAB — HIV ANTIBODY (ROUTINE TESTING W REFLEX): HIV: NONREACTIVE

## 2012-04-13 MED ORDER — VITAMIN K1 10 MG/ML IJ SOLN
10.0000 mg | Freq: Once | INTRAVENOUS | Status: DC
  Filled 2012-04-13: qty 1

## 2012-04-13 MED ORDER — VITAMIN K1 1 MG/0.5ML IJ SOLN
0.5000 mg | Freq: Once | INTRAMUSCULAR | Status: DC
  Filled 2012-04-13: qty 0.5

## 2012-04-13 MED ORDER — METRONIDAZOLE IN NACL 5-0.79 MG/ML-% IV SOLN
500.0000 mg | Freq: Three times a day (TID) | INTRAVENOUS | Status: DC
  Administered 2012-04-13 – 2012-04-16 (×10): 500 mg via INTRAVENOUS
  Filled 2012-04-13 (×11): qty 100

## 2012-04-13 MED ORDER — IOHEXOL 300 MG/ML  SOLN: 100.0000 mL | Freq: Once | INTRAMUSCULAR | Status: AC | PRN

## 2012-04-13 MED ORDER — VITAMIN K1 10 MG/ML IJ SOLN
5.0000 mg | Freq: Once | INTRAVENOUS | Status: AC
  Administered 2012-04-13: 5 mg via INTRAVENOUS
  Filled 2012-04-13: qty 0.5

## 2012-04-13 MED ORDER — IOHEXOL 300 MG/ML  SOLN
25.0000 mL | INTRAMUSCULAR | Status: AC
  Administered 2012-04-13 (×2): 25 mL via ORAL

## 2012-04-13 NOTE — Progress Notes (Signed)
CRITICAL VALUE ALERT  Critical value received:  INR=5.8  Date of notification:  04/13/2012  Time of notification:  0601  Critical value read back:yes  Nurse who received alert:  Laurena Slimmer, RN  MD notified (1st page):  Dr. Garald Braver  Time of first page:  0601  MD notified (2nd page):  Time of second page:  Responding MD:  Dr. Earlene Plater  Time MD responded:  [phone]

## 2012-04-13 NOTE — Progress Notes (Addendum)
Subjective: He states that he still feels weak with decreased appetite and nausea. He denies shortness of breath, V/D.   Objective: Vital signs in last 24 hours: Filed Vitals:   04/12/12 1805 04/12/12 2034 04/13/12 0501 04/13/12 0940  BP: 110/68 118/87 99/57 100/71  Pulse: 91 68 90 100  Temp:  98 F (36.7 C) 99.4 F (37.4 C) 99.3 F (37.4 C)  TempSrc:  Oral Oral Oral  Resp:  18 20   Height:      Weight:  193 lb 2 oz (87.6 kg)    SpO2: 99% 95% 98%    Weight change:   Intake/Output Summary (Last 24 hours) at 04/13/12 1156 Last data filed at 04/13/12 1055  Gross per 24 hour  Intake   1700 ml  Output   1525 ml  Net    175 ml   Vitals reviewed.  General: resting in bed, in NAD, his wife at his bedside  HEENT: Wearing bifocals, PERRL, no scleral icterus  Cardiac: irregular rhythm with occasionally dropped beats, no rubs, murmurs or gallops  Chest: ICD implant noted on left chest  Pulm: clear to auscultation bilaterally, no wheezes, rales, or rhonchi  Abd: obese, soft, nontender, mildly distended, no guarding or rebound tenderness, normal BS present  Ext: warm and well perfused, no pedal edema  Neuro: alert and oriented X3, cranial nerves II-XII grossly intact, strength and sensation to light touch equal in bilateral upper and lower extremities   Lab Results: Basic Metabolic Panel:  Lab 04/13/12 1610 04/12/12 1717 04/12/12 1254  NA 133* -- 134*  K 4.5 -- 5.0  CL 101 -- 98  CO2 21 -- 22  GLUCOSE 97 -- 134*  BUN 43* -- 49*  CREATININE 1.66* -- 2.54*  CALCIUM 9.0 -- 9.4  MG -- 2.6* --  PHOS -- 3.0 --   Liver Function Tests:  Lab 04/13/12 0415 04/12/12 1254  AST 57* 75*  ALT 42 48  ALKPHOS 166* 179*  BILITOT 1.0 1.4*  PROT 7.2 7.8  ALBUMIN 2.7* 3.0*    Lab 04/12/12 1254  LIPASE 17  AMYLASE --   CBC:  Lab 04/13/12 0415 04/12/12 1254  WBC 15.6* 18.6*  NEUTROABS -- 14.0*  HGB 12.7* 14.2  HCT 35.1* 39.0  MCV 89.5 89.7  PLT 170 165   BNP:  Lab 04/12/12  1505  PROBNP 771.7*   Hemoglobin A1C:  Lab 04/12/12 1717  HGBA1C 6.5*   Coagulation:  Lab 04/13/12 0415 04/12/12 1505  LABPROT 48.3* 34.4*  INR 5.85* 3.68*   Urine Drug Screen: Drugs of Abuse     Component Value Date/Time   LABOPIA NONE DETECTED 04/12/2012 2049   COCAINSCRNUR NONE DETECTED 04/12/2012 2049   LABBENZ NONE DETECTED 04/12/2012 2049   AMPHETMU NONE DETECTED 04/12/2012 2049   THCU NONE DETECTED 04/12/2012 2049   LABBARB NONE DETECTED 04/12/2012 2049    Alcohol Level:  Lab 04/12/12 1505  ETH <11   Urinalysis:  Lab 04/12/12 2049  COLORURINE AMBER*  LABSPEC 1.025  PHURINE 5.0  GLUCOSEU NEGATIVE  HGBUR SMALL*  BILIRUBINUR NEGATIVE  KETONESUR NEGATIVE  PROTEINUR 30*  UROBILINOGEN 1.0  NITRITE NEGATIVE  LEUKOCYTESUR TRACE*     Micro Results: No results found for this or any previous visit (from the past 240 hour(s)). Studies/Results: Dg Chest 2 View  04/12/2012  *RADIOLOGY REPORT*  Clinical Data: Weakness, fever  CHEST - 2 VIEW  Comparison: 04/12/2012  Findings: Cardiomediastinal silhouette is unremarkable.  Elevation of the right hemidiaphragm.  Three  leads cardiac pacemaker in place.  No pulmonary edema.  There is right basilar atelectasis or infiltrate.  IMPRESSION: Elevation of the right hemidiaphragm.  Right basilar atelectasis or infiltrate. Follow-up examination to assure resolution is recommended.   Original Report Authenticated By: Natasha Mead, M.D.    US Abdomen Complete  04/12/2012  *RADIOLOGY REPORT*  Clinical Data:  67 year old male with abdominal pain and distention, elevated LFTs and acute renal failure.  ABDOMINAL ULTRASOUND COMPLETE  Comparison:  None  Findings:  Gallbladder: Gallbladder sludge is noted.  There is no evidence of gallstones, gallbladder wall thickening, or pericholecystic fluid.  Common Bile Duct:  There is no evidence of intrahepatic or extrahepatic biliary dilation. The CBD measures 2.1 mm in greatest diameter.  Liver: A 15.4 x 9.8  x 14.1 cm heterogeneous solid mass is identified within the abdomen with mass effect or origin within the porta hepatis/right liver.  There are several hyperechoic masses within the liver also identified which are slightly irregular.  IVC:  Appears normal.  Pancreas: The pancreas is not well visualized.  Spleen:  Within normal limits in size and echotexture.  Right kidney:  The right kidney is normal in size measuring 11.2 cm.  Upper limits of normal renal echogenicity noted.  Mild cortical atrophy is present.  There is no evidence of solid mass, hydronephrosis or definite renal calculi.  Left kidney:  The left kidney is normal in size measuring 12 cm. Upper limits of normal renal echogenicity noted.  There is no evidence of solid mass, hydronephrosis or definite renal calculi. Mild cortical atrophy is noted.  Abdominal Aorta:  No abdominal aortic aneurysm identified the abdominal aorta is difficult to fully visualize.  There is no evidence of ascites.  IMPRESSION: 15.4 x 9.8 x 14.1 cm mass situated in the region of the porta hepatis which involves originates from the liver. Hyperechoic hepatic lesions are suspicious for metastatic disease. Abdominal/pelvic CT with contrast is recommended for further evaluation.  Upper limits of normal renal echogenicity which may be related to medical renal disease.  No evidence of hydronephrosis.  Gallbladder sludge without evidence of cholelithiasis or cholecystitis.   Original Report Authenticated By: Harmon Pier, M.D.    Medications: I have reviewed the patient's current medications. Scheduled Meds:   . antiseptic oral rinse  15 mL Mouth Rinse BID  . aspirin EC  81 mg Oral Daily  . levofloxacin (LEVAQUIN) IV  750 mg Intravenous Q48H  . metroNIDAZOLE  500 mg Intravenous Q8H  . oseltamivir  75 mg Oral Daily  . sodium chloride  3 mL Intravenous Q12H   Continuous Infusions:  PRN Meds:.acetaminophen, acetaminophen, ondansetron (ZOFRAN) IV,  ondansetron Assessment/Plan:  Shortness of breath. Likely multifactorial. His CXR is remarkable for right basilar atelectasis or infiltrate but no edema, however, concerning for PNA. His CXR is also remarkable for and elevated right hemidiaphragm and his LFTs are abnormal, liver failure with possible abdominal ascites is a possible etiology for his shortness of breath. US abdomen reveals a large solid abdominal mass concerning for liver abscess v malignancy. His pro-BNP is only mildly elevate to ~700 and he does not appear to have CHF exacerbation clinically and per CXR. Influenza PCR NS negative.  -Continue telemetry  -PNA treatment per below  -Influenza nasal PCR  -Continued Tamiflu (renally dosed)  -Zofran PRN  -UDS pending  Pneumonia, community acquired pneumonia. He has had subjective fever for days with decreased intake per mouth, his CXR shows right basilar atelectasis or infiltrate. Although his physical  exam is benign, his symptoms at his presentation are concerning for CAP and he will be treated empirically. He has received IV Levaquin while in the ED. Urine Strep pneumoniae and Legionella antigen negative. -Continue IV levaquin while patient has nausea (dosed per pharmacy)  -f/u blood cultures x2  -f/u sputum culture   Intraabdominal mass.  His abdominal US shows a 15.4 x 9.8 x 14.1 cm heterogeneous solid mass within the abdomen with mass effect or origin within the porta hepatis/right liver. There are several hyperechoic masses within the liver also identified which are slightly irregular.  This is concerning for intraabdominal/liver abscess v malignancy.  -CT Abdomen ordered (w/out contrast 2/2 AKI) -Started flagyl -Possible IR drainage/bopsy tomorrow   Leukocytosis. WBC of 18.6 on admission. Likely 2/2 to CAP or liver abscess per above. He is afebrile but with temp of 99.10F in the ED. WBC of 15.6 today, he remains afebrile. UA with trace leucocytes, many bacteria, rare squamous  cells, concerning for UTI, althought he denies urinary symptoms.  -Continue Levaquin per above -f/u Urine culture  -CBC daily   Elevated LFTs. Alk phos of 179, AST 75, and t bili at 1.4 on admission. He denies drinking alcohol or using illicit drugs. He has not history of traveling outside the [place]. He has had poor intake per mouth, SOB, distended abdomen, anorexia, diarrhea. His hepatitis panel is negative, HIV also negative. He has intraabdominal mass concerning for liver abscess v malignancy per above.  Her has increased PT/INR today despite holding coumadin but his LFTs have improved slightly.  -CT abdomen and possible IR biopsy per above.   Acute Renal Failure. Baseline creatinine unknown but creatinine on admission at 2.54 with BUN 49, bicarb 22, and K of 5.0. Most likely prerenal azotemia in the setting of creased intake per mouth for 5 days. He denies urinary retention of dysuria. FeNa <1%.  Cr trended down to 1.66 today. His Mg is elevated at 2.6 (he reports recent use of Epson salts for constipation), phos normal at 3.0.  -Strict I&O  -CBM in AM  -IVF hydration with NS@100ml /hr   Hypotension. Stable. BP of 100/71 today. 78/62 in Urgent Care, up to 106/83 with 1L NS bolus. He is on metoprolol ER, lisinopril-HCTZ, and spironolactone at home and assures compliance with these medications even while sick.  -Hold all antihypertensives at this time  -IVF per above  DM2. Hgb A1C of 6.5%. He denies ever being diagnosed with DM2.  -CBG checks  -SSI sensitive   CHF/ischemic cardiomyopathy/ICD pacemaker. He does not appear volume overloaded on exam with no edema on CXR. No 2D echo on file, he has not seen a Cardiologist in years. He has PVCs on EKG but no active chest pain. He reports that his ICD pacemaker has not fired in years.  -Telemetry  -Attempt to obtain records from PCP  -Will consider 2D echo  -Will reassess volume status in AM  Anticoagulation therapy. He is on coumadin which is  managed by his PCP. It is unclear why he is on this therapy, he thinks it is related to his stroke years ago. His INR is 3.68 on admission but up to 5.85 despite holding warfarin.  -hold in the setting of supratherapeutic INR  -PT/INR in AM  -Will contact his PCP for anti-coagulation therapy clarification  -Vitamin K 10mg  IV once, pt will need INR<2 for IR procedure.   DVT prophylaxis. Currently on coumadin with INR of 5.85 today.   FEN: NS 15ml/hr  Replete  as needed  Regular diet   Dispo: Disposition is deferred at this time, awaiting improvement of current medical problems. Anticipated discharge in approximately 2-3 day(s).   The patient does have a current PCP Karle Plumber, MD), therefore will not be requiring OPC follow-up after discharge.  The patient does not have transportation limitations that hinder transportation to clinic appointments. .Services Needed at time of discharge: Y = Yes, Blank = No PT:   OT:   RN:   Equipment:   Other:     LOS: 1 day   Sara Chu D 04/13/2012, 11:56 AM

## 2012-04-13 NOTE — H&P (Signed)
INTERNAL MEDICINE TEACHING SERVICE Attending Admission Note  Date: 04/13/2012  Patient name: Jonathan Reed  Medical record number: [id number]  Date of birth: [birth year]    I have seen and evaluated Haze Justin and discussed their care with the Residency Team.  67 year old male with a past medical history significant for chronic systolic heart failure, ischemic cardiomyopathy, status post biventricular pacemaker/ICD, presented do to decreased appetite and malaise. He states last week he has felt ill and has had nausea, diarrhea, fever. He also states he's had some shortness of breath on exertion over the previous few days with associated diarrhea. He has not been able to keep up with oral intake. He states he may have had a sick contact with flu, has not had his vaccine. He was recently seen in urgent care and was found to have a blood pressure 78/62 and sent to the ED. His blood pressure improved with IV fluid resuscitation. Currently he admits to some nausea and some minimal epigastric tenderness. Chest x-ray findings concerning for underlying pneumonia. He had a leukocytosis of 18,000, a creatinine of 2.54, which has decreased to 1.66 this morning. He has a slightly elevated AST, and an elevated alkaline phosphatase. Ultrasound of the abdomen shows a 15.4 x 9.8 x 14.1 cm mass in the liver. He had additional lesions that are suspicious for metastatic disease. Has some evidence of renal echogenicity likely representative of underlying chronic kidney disease.  Physical Exam: Blood pressure 100/71, pulse 100, temperature 99.3 F (37.4 C), temperature source Oral, resp. rate 20, height 5' 9.29" (1.76 m), weight 193 lb 2 oz (87.6 kg), SpO2 98.00%.  General: Vital signs reviewed and noted. Well-developed, well-nourished, in no acute distress; alert, appropriate and cooperative throughout examination.  Head: Normocephalic, atraumatic.  Eyes: PERRL, EOMI, No signs of anemia or jaundince.  Nose:  Mucous membranes moist, not inflammed, nonerythematous.  Throat: Oropharynx nonerythematous, no exudate appreciated.   Neck: No deformities, masses, or tenderness noted.Supple, No carotid Bruits, no JVD.  Lungs:  Normal respiratory effort. Clear to auscultation BL without crackles or wheezes.  Heart:  paced rhythm, no JVD, no gallops, no rubs   Abdomen:   soft, minimal right upper quadrant tenderness, diffusely enlarged liver with no distinct palpable mass.   Extremities: No pretibial edema.  Neurologic: A&O X3, CN II - XII are grossly intact. Motor strength is 5/5 in the all 4 extremities, Sensations intact to light touch, Cerebellar signs negative.  Skin: No visible rashes, scars.    Lab results: Results for orders placed during the hospital encounter of 04/12/12 (from the past 24 hour(s))  PROTIME-INR     Status: Abnormal   Collection Time   04/12/12  3:05 PM      Component Value Range   Prothrombin Time 34.4 (*) 11.6 - 15.2 seconds   INR 3.68 (*) 0.00 - 1.49  PRO B NATRIURETIC PEPTIDE     Status: Abnormal   Collection Time   04/12/12  3:05 PM      Component Value Range   Pro B Natriuretic peptide (BNP) 771.7 (*) 0 - 125 pg/mL  ETHANOL     Status: Normal   Collection Time   04/12/12  3:05 PM      Component Value Range   Alcohol, Ethyl (B) <11  0 - 11 mg/dL  POCT I-STAT TROPONIN I     Status: Normal   Collection Time   04/12/12  3:23 PM      Component Value Range  Troponin i, poc 0.02  0.00 - 0.08 ng/mL   Comment 3           MAGNESIUM     Status: Abnormal   Collection Time   04/12/12  5:17 PM      Component Value Range   Magnesium 2.6 (*) 1.5 - 2.5 mg/dL  PHOSPHORUS     Status: Normal   Collection Time   04/12/12  5:17 PM      Component Value Range   Phosphorus 3.0  2.3 - 4.6 mg/dL  HEPATITIS PANEL, ACUTE     Status: Normal   Collection Time   04/12/12  5:17 PM      Component Value Range   Hepatitis B Surface Ag NEGATIVE  NEGATIVE   HCV Ab NEGATIVE  NEGATIVE   Hep A IgM  NEGATIVE  NEGATIVE   Hep B C IgM NEGATIVE  NEGATIVE  HEMOGLOBIN A1C     Status: Abnormal   Collection Time   04/12/12  5:17 PM      Component Value Range   Hemoglobin A1C 6.5 (*) <5.7 %   Mean Plasma Glucose 140 (*) <117 mg/dL  HIV ANTIBODY (ROUTINE TESTING)     Status: Normal   Collection Time   04/12/12  5:17 PM      Component Value Range   HIV NON REACTIVE  NON REACTIVE  INFLUENZA PANEL BY PCR     Status: Normal   Collection Time   04/12/12  5:39 PM      Component Value Range   Influenza A By PCR NEGATIVE  NEGATIVE   Influenza B By PCR NEGATIVE  NEGATIVE   H1N1 flu by pcr NOT DETECTED  NOT DETECTED  URINE RAPID DRUG SCREEN (HOSP PERFORMED)     Status: Normal   Collection Time   04/12/12  8:49 PM      Component Value Range   Opiates NONE DETECTED  NONE DETECTED   Cocaine NONE DETECTED  NONE DETECTED   Benzodiazepines NONE DETECTED  NONE DETECTED   Amphetamines NONE DETECTED  NONE DETECTED   Tetrahydrocannabinol NONE DETECTED  NONE DETECTED   Barbiturates NONE DETECTED  NONE DETECTED  URINALYSIS, ROUTINE W REFLEX MICROSCOPIC     Status: Abnormal   Collection Time   04/12/12  8:49 PM      Component Value Range   Color, Urine AMBER (*) YELLOW   APPearance CLOUDY (*) CLEAR   Specific Gravity, Urine 1.025  1.005 - 1.030   pH 5.0  5.0 - 8.0   Glucose, UA NEGATIVE  NEGATIVE mg/dL   Hgb urine dipstick SMALL (*) NEGATIVE   Bilirubin Urine NEGATIVE  NEGATIVE   Ketones, ur NEGATIVE  NEGATIVE mg/dL   Protein, ur 30 (*) NEGATIVE mg/dL   Urobilinogen, UA 1.0  0.0 - 1.0 mg/dL   Nitrite NEGATIVE  NEGATIVE   Leukocytes, UA TRACE (*) NEGATIVE  SODIUM, URINE, RANDOM     Status: Normal   Collection Time   04/12/12  8:49 PM      Component Value Range   Sodium, Ur 34    CREATININE, URINE, RANDOM     Status: Normal   Collection Time   04/12/12  8:49 PM      Component Value Range   Creatinine, Urine 184.28    LEGIONELLA ANTIGEN, URINE     Status: Normal   Collection Time   04/12/12  8:49  PM      Component Value Range   Specimen Description URINE, RANDOM  Special Requests NONE     Legionella Antigen, Urine Negative for Legionella pneumophilia serogroup 1     Report Status 04/13/2012 FINAL    STREP PNEUMONIAE URINARY ANTIGEN     Status: Normal   Collection Time   04/12/12  8:49 PM      Component Value Range   Strep Pneumo Urinary Antigen NEGATIVE  NEGATIVE  URINE MICROSCOPIC-ADD ON     Status: Abnormal   Collection Time   04/12/12  8:49 PM      Component Value Range   Squamous Epithelial / LPF RARE  RARE   WBC, UA 0-2  <3 WBC/hpf   RBC / HPF 0-2  <3 RBC/hpf   Bacteria, UA MANY (*) RARE   Casts GRANULAR CAST (*) NEGATIVE  COMPREHENSIVE METABOLIC PANEL     Status: Abnormal   Collection Time   04/13/12  4:15 AM      Component Value Range   Sodium 133 (*) 135 - 145 mEq/L   Potassium 4.5  3.5 - 5.1 mEq/L   Chloride 101  96 - 112 mEq/L   CO2 21  19 - 32 mEq/L   Glucose, Bld 97  70 - 99 mg/dL   BUN 43 (*) 6 - 23 mg/dL   Creatinine, Ser 1.61 (*) 0.50 - 1.35 mg/dL   Calcium 9.0  8.4 - 09.6 mg/dL   Total Protein 7.2  6.0 - 8.3 g/dL   Albumin 2.7 (*) 3.5 - 5.2 g/dL   AST 57 (*) 0 - 37 U/L   ALT 42  0 - 53 U/L   Alkaline Phosphatase 166 (*) 39 - 117 U/L   Total Bilirubin 1.0  0.3 - 1.2 mg/dL   GFR calc non Af Amer 42 (*) >90 mL/min   GFR calc Af Amer 49 (*) >90 mL/min  CBC     Status: Abnormal   Collection Time   04/13/12  4:15 AM      Component Value Range   WBC 15.6 (*) 4.0 - 10.5 K/uL   RBC 3.92 (*) 4.22 - 5.81 MIL/uL   Hemoglobin 12.7 (*) 13.0 - 17.0 g/dL   HCT 04.5 (*) 40.9 - 81.1 %   MCV 89.5  78.0 - 100.0 fL   MCH 32.4  26.0 - 34.0 pg   MCHC 36.2 (*) 30.0 - 36.0 g/dL   RDW 91.4  78.2 - 95.6 %   Platelets 170  150 - 400 K/uL  PROTIME-INR     Status: Abnormal   Collection Time   04/13/12  4:15 AM      Component Value Range   Prothrombin Time 48.3 (*) 11.6 - 15.2 seconds   INR 5.85 (*) 0.00 - 1.49    Imaging results:  Dg Chest 2 View  04/12/2012   *RADIOLOGY REPORT*  Clinical Data: Weakness, fever  CHEST - 2 VIEW  Comparison: 04/12/2012  Findings: Cardiomediastinal silhouette is unremarkable.  Elevation of the right hemidiaphragm.  Three leads cardiac pacemaker in place.  No pulmonary edema.  There is right basilar atelectasis or infiltrate.  IMPRESSION: Elevation of the right hemidiaphragm.  Right basilar atelectasis or infiltrate. Follow-up examination to assure resolution is recommended.   Original Report Authenticated By: Natasha Mead, M.D.    US Abdomen Complete  04/12/2012  *RADIOLOGY REPORT*  Clinical Data:  67 year old male with abdominal pain and distention, elevated LFTs and acute renal failure.  ABDOMINAL ULTRASOUND COMPLETE  Comparison:  None  Findings:  Gallbladder: Gallbladder sludge is noted.  There is no  evidence of gallstones, gallbladder wall thickening, or pericholecystic fluid.  Common Bile Duct:  There is no evidence of intrahepatic or extrahepatic biliary dilation. The CBD measures 2.1 mm in greatest diameter.  Liver: A 15.4 x 9.8 x 14.1 cm heterogeneous solid mass is identified within the abdomen with mass effect or origin within the porta hepatis/right liver.  There are several hyperechoic masses within the liver also identified which are slightly irregular.  IVC:  Appears normal.  Pancreas: The pancreas is not well visualized.  Spleen:  Within normal limits in size and echotexture.  Right kidney:  The right kidney is normal in size measuring 11.2 cm.  Upper limits of normal renal echogenicity noted.  Mild cortical atrophy is present.  There is no evidence of solid mass, hydronephrosis or definite renal calculi.  Left kidney:  The left kidney is normal in size measuring 12 cm. Upper limits of normal renal echogenicity noted.  There is no evidence of solid mass, hydronephrosis or definite renal calculi. Mild cortical atrophy is noted.  Abdominal Aorta:  No abdominal aortic aneurysm identified the abdominal aorta is difficult to fully  visualize.  There is no evidence of ascites.  IMPRESSION: 15.4 x 9.8 x 14.1 cm mass situated in the region of the porta hepatis which involves originates from the liver. Hyperechoic hepatic lesions are suspicious for metastatic disease. Abdominal/pelvic CT with contrast is recommended for further evaluation.  Upper limits of normal renal echogenicity which may be related to medical renal disease.  No evidence of hydronephrosis.  Gallbladder sludge without evidence of cholelithiasis or cholecystitis.   Original Report Authenticated By: Harmon Pier, M.D.      Assessment and Plan: I agree with the formulated Assessment and Plan with the following changes: 67 year old male with a past medical history significant for chronic systolic heart failure, ischemic cardiomyopathy, status post biventricular pacemaker/ICD, presented do to decreased appetite and malaise, with possible community-acquired pneumonia and finding of liver mass. #1 dyspnea:  Agree with treatment with Levaquin for now. Not certain that this represents pneumonia but his right lower lobe is difficult to see on x-ray the due to hepatic mass. Monitor on telemetry. Troponins so far negative. I would obtain one more troponin this patient. #2 liver mass: Obtain a CT of the abdomen without contrast. This could represent tumor versus abscess. I would add Flagyl to his regimen. #3 acute renal failure: His baseline creatinine is unknown but he likely has underlying chronic kidney disease. He likely is prerenal as shown by his improvement with IV fluids. Out of your careful with fluid resuscitation in this patient due to cardiac history. Watch volume status closely. Monitor urine output. I would hold his diuretics at this time. Depending on findings of CT scan, we may have to biopsy the liver mass versus abscess.  [name], DO 1/27/201412:57 PM

## 2012-04-14 DIAGNOSIS — C7B8 Other secondary neuroendocrine tumors: Secondary | ICD-10-CM

## 2012-04-14 DIAGNOSIS — J449 Chronic obstructive pulmonary disease, unspecified: Secondary | ICD-10-CM

## 2012-04-14 DIAGNOSIS — R791 Abnormal coagulation profile: Secondary | ICD-10-CM

## 2012-04-14 LAB — PROTIME-INR
INR: >10 (ref 0.00–1.49)
INR: 1.71 — ABNORMAL HIGH (ref 0.00–1.49)
Prothrombin Time: >90 seconds — ABNORMAL HIGH (ref 11.6–15.2)

## 2012-04-14 LAB — COMPREHENSIVE METABOLIC PANEL
AST: 49 U/L — ABNORMAL HIGH (ref 0–37)
Albumin: 2.7 g/dL — ABNORMAL LOW (ref 3.5–5.2)
Alkaline Phosphatase: 190 U/L — ABNORMAL HIGH (ref 39–117)
Chloride: 100 mEq/L (ref 96–112)
Potassium: 3.9 mEq/L (ref 3.5–5.1)
Sodium: 135 mEq/L (ref 135–145)
Total Bilirubin: 2.1 mg/dL — ABNORMAL HIGH (ref 0.3–1.2)
Total Protein: 7.1 g/dL (ref 6.0–8.3)

## 2012-04-14 LAB — URINE CULTURE: Culture: NO GROWTH

## 2012-04-14 LAB — CBC
Platelets: 196 10*3/uL (ref 150–400)
RDW: 15 % (ref 11.5–15.5)
WBC: 14 10*3/uL — ABNORMAL HIGH (ref 4.0–10.5)

## 2012-04-14 MED ORDER — LEVOFLOXACIN IN D5W 750 MG/150ML IV SOLN
750.0000 mg | INTRAVENOUS | Status: DC
  Filled 2012-04-14: qty 150

## 2012-04-14 MED ORDER — VITAMIN K1 10 MG/ML IJ SOLN
10.0000 mg | Freq: Once | INTRAVENOUS | Status: AC
  Administered 2012-04-14: 10 mg via INTRAVENOUS
  Filled 2012-04-14: qty 1

## 2012-04-14 MED ORDER — OXYCODONE-ACETAMINOPHEN 5-325 MG PO TABS
1.0000 | ORAL_TABLET | ORAL | Status: DC | PRN
  Administered 2012-04-14 – 2012-04-15 (×4): 2 via ORAL
  Filled 2012-04-14 (×4): qty 2

## 2012-04-14 NOTE — Progress Notes (Signed)
Received a call from the lab. With critical value: PT>90,and INR>10.MD. Was notified.keep assessing pt. Closely.

## 2012-04-14 NOTE — Progress Notes (Signed)
Patient ID: Jonathan Reed, male   DOB: [birth year], 67 y.o.   MRN: [id number]                                       Request received for liver lesion biopsy on pt with recent imaging revealing multiple liver lesions and no known primary. Imaging studies have been reviewed by Dr. Archer Asa. Additional PMH as below. Exam: pt awake/alert; chest- CTA bilat; heart- RRR with occ ectopy (pt has pacemaker); abd- soft, mild- mod dist., +BS, tender epigastric region; ext-FROM.    Filed Vitals:   04/13/12 1859 04/13/12 1900 04/14/12 0520 04/14/12 0955  BP:  100/70 127/68 133/80  Pulse:  78 95 74  Temp: 98.2 F (36.8 C) 98.2 F (36.8 C) 99.7 F (37.6 C) 98.4 F (36.9 C)  TempSrc:  Oral Oral Oral  Resp:  20 20 20   Height:      Weight:  195 lb 12.3 oz (88.8 kg)    SpO2:  96% 100% 94%   Past Medical History  Diagnosis Date  . CHF (congestive heart failure)   . CVA (cerebral infarction) 2011    No residual Sx  . Stroke 2011   Past Surgical History  Procedure Date  . Permanent pacemaker insertion    Ct Abdomen Pelvis Wo Contrast  04/13/2012  *RADIOLOGY REPORT*  Clinical Data: As on recent ultrasound exam.  CT ABDOMEN AND PELVIS WITHOUT CONTRAST  Technique:  Multidetector CT imaging of the abdomen and pelvis was performed following the standard protocol without intravenous contrast.  Comparison: Ultrasound exam from yesterday.  Findings: Intravenous contrast was withheld, presumably secondary to the patient's renal insufficiency.  Images which include the lower chest show asymmetric elevation of the right hemidiaphragm with some atelectasis at the right base.  As seen at ultrasound, the patient has a dominant, heterogeneous lesion in the central liver.  This is somewhat difficult to assess given the lack of intravenous contrast material, but it appears to arise from the caudate lobe and measures 14 x 10 x 12 cm. Other smaller lesions are scattered about the liver, including a 4.0 cm lesion in the inferior  liver just anterior to the gallbladder and a lateral 1.9 cm lesion at the dome of the right liver.  Calcified granulomatous disease is seen in the spleen.  The stomach is decompressed.  The duodenum, pancreas, gallbladder, and adrenal glands are unremarkable.  Cortical scarring is noted in both kidneys without renal stone or hydronephrosis.  No definite renal mass is evident.  No abdominal aortic aneurysm.  No bulky retroperitoneal or intraperitoneal lymphadenopathy.  No evidence for bowel obstruction.  Imaging through the pelvis shows no free intraperitoneal fluid. There is no pelvic sidewall lymphadenopathy.  The prostate gland is enlarged.  No evidence for colonic mass.  The terminal ileum and the appendix are normal.  Bone windows reveal no worrisome lytic or sclerotic osseous lesions.  IMPRESSION: Dominant heterogeneous mass lesion in the central liver, apparently arising from the caudate lobe, measures up to 14 cm in diameter and generating mass effect in the porta hepatis and central abdomen. Other smaller hypoattenuating lesions are seen elsewhere in the liver parenchyma.  These changes may be related to primary liver neoplasm although no underlying features are evident on today's CT scan to suggest cirrhosis.  Metastatic disease would also be a consideration although no primary lesion is evident in the abdomen or  pelvis.  The lack of intravenous contrast on today's study hinders assessment.   Original Report Authenticated By: Kennith Center, M.D.    Dg Chest 2 View  04/12/2012  *RADIOLOGY REPORT*  Clinical Data: Weakness, fever  CHEST - 2 VIEW  Comparison: 04/12/2012  Findings: Cardiomediastinal silhouette is unremarkable.  Elevation of the right hemidiaphragm.  Three leads cardiac pacemaker in place.  No pulmonary edema.  There is right basilar atelectasis or infiltrate.  IMPRESSION: Elevation of the right hemidiaphragm.  Right basilar atelectasis or infiltrate. Follow-up examination to assure resolution  is recommended.   Original Report Authenticated By: Natasha Mead, M.D.    US Abdomen Complete  04/12/2012  *RADIOLOGY REPORT*  Clinical Data:  67 year old male with abdominal pain and distention, elevated LFTs and acute renal failure.  ABDOMINAL ULTRASOUND COMPLETE  Comparison:  None  Findings:  Gallbladder: Gallbladder sludge is noted.  There is no evidence of gallstones, gallbladder wall thickening, or pericholecystic fluid.  Common Bile Duct:  There is no evidence of intrahepatic or extrahepatic biliary dilation. The CBD measures 2.1 mm in greatest diameter.  Liver: A 15.4 x 9.8 x 14.1 cm heterogeneous solid mass is identified within the abdomen with mass effect or origin within the porta hepatis/right liver.  There are several hyperechoic masses within the liver also identified which are slightly irregular.  IVC:  Appears normal.  Pancreas: The pancreas is not well visualized.  Spleen:  Within normal limits in size and echotexture.  Right kidney:  The right kidney is normal in size measuring 11.2 cm.  Upper limits of normal renal echogenicity noted.  Mild cortical atrophy is present.  There is no evidence of solid mass, hydronephrosis or definite renal calculi.  Left kidney:  The left kidney is normal in size measuring 12 cm. Upper limits of normal renal echogenicity noted.  There is no evidence of solid mass, hydronephrosis or definite renal calculi. Mild cortical atrophy is noted.  Abdominal Aorta:  No abdominal aortic aneurysm identified the abdominal aorta is difficult to fully visualize.  There is no evidence of ascites.  IMPRESSION: 15.4 x 9.8 x 14.1 cm mass situated in the region of the porta hepatis which involves originates from the liver. Hyperechoic hepatic lesions are suspicious for metastatic disease. Abdominal/pelvic CT with contrast is recommended for further evaluation.  Upper limits of normal renal echogenicity which may be related to medical renal disease.  No evidence of hydronephrosis.   Gallbladder sludge without evidence of cholelithiasis or cholecystitis.   Original Report Authenticated By: Harmon Pier, M.D.   Results for orders placed during the hospital encounter of 04/12/12  CBC WITH DIFFERENTIAL      Component Value Range   WBC 18.6 (*) 4.0 - 10.5 K/uL   RBC 4.35  4.22 - 5.81 MIL/uL   Hemoglobin 14.2  13.0 - 17.0 g/dL   HCT 96.0  45.4 - 09.8 %   MCV 89.7  78.0 - 100.0 fL   MCH 32.6  26.0 - 34.0 pg   MCHC 36.4 (*) 30.0 - 36.0 g/dL   RDW 11.9  14.7 - 82.9 %   Platelets 165  150 - 400 K/uL   Neutrophils Relative 75  43 - 77 %   Neutro Abs 14.0 (*) 1.7 - 7.7 K/uL   Lymphocytes Relative 6 (*) 12 - 46 %   Lymphs Abs 1.1  0.7 - 4.0 K/uL   Monocytes Relative 19 (*) 3 - 12 %   Monocytes Absolute 3.5 (*) 0.1 - 1.0 K/uL  Eosinophils Relative 0  0 - 5 %   Eosinophils Absolute 0.0  0.0 - 0.7 K/uL   Basophils Relative 0  0 - 1 %   Basophils Absolute 0.0  0.0 - 0.1 K/uL  COMPREHENSIVE METABOLIC PANEL      Component Value Range   Sodium 134 (*) 135 - 145 mEq/L   Potassium 5.0  3.5 - 5.1 mEq/L   Chloride 98  96 - 112 mEq/L   CO2 22  19 - 32 mEq/L   Glucose, Bld 134 (*) 70 - 99 mg/dL   BUN 49 (*) 6 - 23 mg/dL   Creatinine, Ser 1.61 (*) 0.50 - 1.35 mg/dL   Calcium 9.4  8.4 - 09.6 mg/dL   Total Protein 7.8  6.0 - 8.3 g/dL   Albumin 3.0 (*) 3.5 - 5.2 g/dL   AST 75 (*) 0 - 37 U/L   ALT 48  0 - 53 U/L   Alkaline Phosphatase 179 (*) 39 - 117 U/L   Total Bilirubin 1.4 (*) 0.3 - 1.2 mg/dL   GFR calc non Af Amer 25 (*) >90 mL/min   GFR calc Af Amer 29 (*) >90 mL/min  LIPASE, BLOOD      Component Value Range   Lipase 17  11 - 59 U/L  PROTIME-INR      Component Value Range   Prothrombin Time 34.4 (*) 11.6 - 15.2 seconds   INR 3.68 (*) 0.00 - 1.49  PRO B NATRIURETIC PEPTIDE      Component Value Range   Pro B Natriuretic peptide (BNP) 771.7 (*) 0 - 125 pg/mL  URINE RAPID DRUG SCREEN (HOSP PERFORMED)      Component Value Range   Opiates NONE DETECTED  NONE DETECTED    Cocaine NONE DETECTED  NONE DETECTED   Benzodiazepines NONE DETECTED  NONE DETECTED   Amphetamines NONE DETECTED  NONE DETECTED   Tetrahydrocannabinol NONE DETECTED  NONE DETECTED   Barbiturates NONE DETECTED  NONE DETECTED  URINALYSIS, ROUTINE W REFLEX MICROSCOPIC      Component Value Range   Color, Urine AMBER (*) YELLOW   APPearance CLOUDY (*) CLEAR   Specific Gravity, Urine 1.025  1.005 - 1.030   pH 5.0  5.0 - 8.0   Glucose, UA NEGATIVE  NEGATIVE mg/dL   Hgb urine dipstick SMALL (*) NEGATIVE   Bilirubin Urine NEGATIVE  NEGATIVE   Ketones, ur NEGATIVE  NEGATIVE mg/dL   Protein, ur 30 (*) NEGATIVE mg/dL   Urobilinogen, UA 1.0  0.0 - 1.0 mg/dL   Nitrite NEGATIVE  NEGATIVE   Leukocytes, UA TRACE (*) NEGATIVE  ETHANOL      Component Value Range   Alcohol, Ethyl (B) <11  0 - 11 mg/dL  POCT I-STAT TROPONIN I      Component Value Range   Troponin i, poc 0.02  0.00 - 0.08 ng/mL   Comment 3           MAGNESIUM      Component Value Range   Magnesium 2.6 (*) 1.5 - 2.5 mg/dL  PHOSPHORUS      Component Value Range   Phosphorus 3.0  2.3 - 4.6 mg/dL  COMPREHENSIVE METABOLIC PANEL      Component Value Range   Sodium 133 (*) 135 - 145 mEq/L   Potassium 4.5  3.5 - 5.1 mEq/L   Chloride 101  96 - 112 mEq/L   CO2 21  19 - 32 mEq/L   Glucose, Bld 97  70 - 99  mg/dL   BUN 43 (*) 6 - 23 mg/dL   Creatinine, Ser 6.21 (*) 0.50 - 1.35 mg/dL   Calcium 9.0  8.4 - 30.8 mg/dL   Total Protein 7.2  6.0 - 8.3 g/dL   Albumin 2.7 (*) 3.5 - 5.2 g/dL   AST 57 (*) 0 - 37 U/L   ALT 42  0 - 53 U/L   Alkaline Phosphatase 166 (*) 39 - 117 U/L   Total Bilirubin 1.0  0.3 - 1.2 mg/dL   GFR calc non Af Amer 42 (*) >90 mL/min   GFR calc Af Amer 49 (*) >90 mL/min  CBC      Component Value Range   WBC 15.6 (*) 4.0 - 10.5 K/uL   RBC 3.92 (*) 4.22 - 5.81 MIL/uL   Hemoglobin 12.7 (*) 13.0 - 17.0 g/dL   HCT 65.7 (*) 84.6 - 96.2 %   MCV 89.5  78.0 - 100.0 fL   MCH 32.4  26.0 - 34.0 pg   MCHC 36.2 (*) 30.0 - 36.0  g/dL   RDW 95.2  84.1 - 32.4 %   Platelets 170  150 - 400 K/uL  PROTIME-INR      Component Value Range   Prothrombin Time 48.3 (*) 11.6 - 15.2 seconds   INR 5.85 (*) 0.00 - 1.49  HEPATITIS PANEL, ACUTE      Component Value Range   Hepatitis B Surface Ag NEGATIVE  NEGATIVE   HCV Ab NEGATIVE  NEGATIVE   Hep A IgM NEGATIVE  NEGATIVE   Hep B C IgM NEGATIVE  NEGATIVE  HEMOGLOBIN A1C      Component Value Range   Hemoglobin A1C 6.5 (*) <5.7 %   Mean Plasma Glucose 140 (*) <117 mg/dL  SODIUM, URINE, RANDOM      Component Value Range   Sodium, Ur 34    CREATININE, URINE, RANDOM      Component Value Range   Creatinine, Urine 184.28    CULTURE, BLOOD (ROUTINE X 2)      Component Value Range   Specimen Description BLOOD LEFT HAND     Special Requests BOTTLES DRAWN AEROBIC AND ANAEROBIC 10CC     Culture  Setup Time 04/13/2012 01:04     Culture       Value:        BLOOD CULTURE RECEIVED NO GROWTH TO DATE CULTURE WILL BE HELD FOR 5 DAYS BEFORE ISSUING A FINAL NEGATIVE REPORT   Report Status PENDING    CULTURE, BLOOD (ROUTINE X 2)      Component Value Range   Specimen Description BLOOD LEFT HAND     Special Requests BOTTLES DRAWN AEROBIC AND ANAEROBIC 10CC     Culture  Setup Time 04/13/2012 01:04     Culture       Value:        BLOOD CULTURE RECEIVED NO GROWTH TO DATE CULTURE WILL BE HELD FOR 5 DAYS BEFORE ISSUING A FINAL NEGATIVE REPORT   Report Status PENDING    HIV ANTIBODY (ROUTINE TESTING)      Component Value Range   HIV NON REACTIVE  NON REACTIVE  LEGIONELLA ANTIGEN, URINE      Component Value Range   Specimen Description URINE, RANDOM     Special Requests NONE     Legionella Antigen, Urine Negative for Legionella pneumophilia serogroup 1     Report Status 04/13/2012 FINAL    STREP PNEUMONIAE URINARY ANTIGEN      Component Value Range  Strep Pneumo Urinary Antigen NEGATIVE  NEGATIVE  INFLUENZA PANEL BY PCR      Component Value Range   Influenza A By PCR NEGATIVE  NEGATIVE    Influenza B By PCR NEGATIVE  NEGATIVE   H1N1 flu by pcr NOT DETECTED  NOT DETECTED  URINE MICROSCOPIC-ADD ON      Component Value Range   Squamous Epithelial / LPF RARE  RARE   WBC, UA 0-2  <3 WBC/hpf   RBC / HPF 0-2  <3 RBC/hpf   Bacteria, UA MANY (*) RARE   Casts GRANULAR CAST (*) NEGATIVE  PROTIME-INR      Component Value Range   Prothrombin Time >90.0 (*) 11.6 - 15.2 seconds   INR >10.00 (*) 0.00 - 1.49  CBC      Component Value Range   WBC 14.0 (*) 4.0 - 10.5 K/uL   RBC 3.89 (*) 4.22 - 5.81 MIL/uL   Hemoglobin 12.3 (*) 13.0 - 17.0 g/dL   HCT 16.1 (*) 09.6 - 04.5 %   MCV 91.0  78.0 - 100.0 fL   MCH 31.6  26.0 - 34.0 pg   MCHC 34.7  30.0 - 36.0 g/dL   RDW 40.9  81.1 - 91.4 %   Platelets 196  150 - 400 K/uL  COMPREHENSIVE METABOLIC PANEL      Component Value Range   Sodium 135  135 - 145 mEq/L   Potassium 3.9  3.5 - 5.1 mEq/L   Chloride 100  96 - 112 mEq/L   CO2 23  19 - 32 mEq/L   Glucose, Bld 96  70 - 99 mg/dL   BUN 29 (*) 6 - 23 mg/dL   Creatinine, Ser 7.82  0.50 - 1.35 mg/dL   Calcium 9.4  8.4 - 95.6 mg/dL   Total Protein 7.1  6.0 - 8.3 g/dL   Albumin 2.7 (*) 3.5 - 5.2 g/dL   AST 49 (*) 0 - 37 U/L   ALT 45  0 - 53 U/L   Alkaline Phosphatase 190 (*) 39 - 117 U/L   Total Bilirubin 2.1 (*) 0.3 - 1.2 mg/dL   GFR calc non Af Amer 56 (*) >90 mL/min   GFR calc Af Amer 65 (*) >90 mL/min  PROTIME-INR      Component Value Range   Prothrombin Time 19.5 (*) 11.6 - 15.2 seconds   INR 1.71 (*) 0.00 - 1.49   A/P: Pt with abdominal pain and multiple liver lesions, unknown primary. Plan is for US guided liver lesion biopsy on 1/29 if PT/INR within normal limits. Details/risks of procedure d/w pt/family with their understanding and consent.

## 2012-04-14 NOTE — Progress Notes (Signed)
Subjective: He continues to have abdominal discomfort and nausea but no vomiting, he tolerated breakfast and lunch. He denies hematuria, melena, hematochezia.    Objective: Vital signs in last 24 hours: Filed Vitals:   04/14/12 0520 04/14/12 0955 04/14/12 1350 04/14/12 1701  BP: 127/68 133/80 128/76 110/72  Pulse: 95 74 76 85  Temp: 99.7 F (37.6 C) 98.4 F (36.9 C) 98 F (36.7 C) 99.9 F (37.7 C)  TempSrc: Oral Oral Oral Oral  Resp: 20 20 20 20   Height:      Weight:      SpO2: 100% 94% 98% 98%   Weight change: 7 lb 11.5 oz (3.5 kg)  Intake/Output Summary (Last 24 hours) at 04/14/12 1838 Last data filed at 04/14/12 1700  Gross per 24 hour  Intake    483 ml  Output   1050 ml  Net   -567 ml   Vitals reviewed.  General: resting in bed, in NAD, his wife at his bedside  HEENT: Wearing bifocals, PERRL, no scleral icterus  Cardiac: irregular rhythm, no rubs, murmurs or gallops  Chest: ICD implant noted on left chest  Pulm: clear to auscultation bilaterally, no wheezes, rales, or rhonchi  Abd: obese, soft, nontender, mildly distended, no guarding or rebound tenderness, normal BS present  Ext: warm and well perfused, no pedal edema  Neuro: alert and oriented X3, cranial nerves II-XII grossly intact, strength and sensation to light touch equal in bilateral upper and lower extremities  Lab Results: Basic Metabolic Panel:  Lab 04/14/12 4540 04/13/12 0415 04/12/12 1717  NA 135 133* --  K 3.9 4.5 --  CL 100 101 --  CO2 23 21 --  GLUCOSE 96 97 --  BUN 29* 43* --  CREATININE 1.31 1.66* --  CALCIUM 9.4 9.0 --  MG -- -- 2.6*  PHOS -- -- 3.0   Liver Function Tests:  Lab 04/14/12 0823 04/13/12 0415  AST 49* 57*  ALT 45 42  ALKPHOS 190* 166*  BILITOT 2.1* 1.0  PROT 7.1 7.2  ALBUMIN 2.7* 2.7*    Lab 04/12/12 1254  LIPASE 17  AMYLASE --   CBC:  Lab 04/14/12 0823 04/13/12 0415 04/12/12 1254  WBC 14.0* 15.6* --  NEUTROABS -- -- 14.0*  HGB 12.3* 12.7* --  HCT 35.4*  35.1* --  MCV 91.0 89.5 --  PLT 196 170 --   BNP:  Lab 04/12/12 1505  PROBNP 771.7*   Hemoglobin A1C:  Lab 04/12/12 1717  HGBA1C 6.5*   Coagulation:  Lab 04/14/12 0828 04/14/12 0635 04/13/12 0415 04/12/12 1505  LABPROT 19.5* >90.0* 48.3* 34.4*  INR 1.71* >10.00* 5.85* 3.68*   Urine Drug Screen: Drugs of Abuse     Component Value Date/Time   LABOPIA NONE DETECTED 04/12/2012 2049   COCAINSCRNUR NONE DETECTED 04/12/2012 2049   LABBENZ NONE DETECTED 04/12/2012 2049   AMPHETMU NONE DETECTED 04/12/2012 2049   THCU NONE DETECTED 04/12/2012 2049   LABBARB NONE DETECTED 04/12/2012 2049    Alcohol Level:  Lab 04/12/12 1505  ETH <11   Urinalysis:  Lab 04/12/12 2049  COLORURINE AMBER*  LABSPEC 1.025  PHURINE 5.0  GLUCOSEU NEGATIVE  HGBUR SMALL*  BILIRUBINUR NEGATIVE  KETONESUR NEGATIVE  PROTEINUR 30*  UROBILINOGEN 1.0  NITRITE NEGATIVE  LEUKOCYTESUR TRACE*    Micro Results: Recent Results (from the past 240 hour(s))  CULTURE, BLOOD (ROUTINE X 2)     Status: Normal (Preliminary result)   Collection Time   04/12/12  5:25 PM  Component Value Range Status Comment   Specimen Description BLOOD LEFT HAND   Final    Special Requests BOTTLES DRAWN AEROBIC AND ANAEROBIC 10CC   Final    Culture  Setup Time 04/13/2012 01:04   Final    Culture     Final    Value:        BLOOD CULTURE RECEIVED NO GROWTH TO DATE CULTURE WILL BE HELD FOR 5 DAYS BEFORE ISSUING A FINAL NEGATIVE REPORT   Report Status PENDING   Incomplete   CULTURE, BLOOD (ROUTINE X 2)     Status: Normal (Preliminary result)   Collection Time   04/12/12  5:35 PM      Component Value Range Status Comment   Specimen Description BLOOD LEFT HAND   Final    Special Requests BOTTLES DRAWN AEROBIC AND ANAEROBIC 10CC   Final    Culture  Setup Time 04/13/2012 01:04   Final    Culture     Final    Value:        BLOOD CULTURE RECEIVED NO GROWTH TO DATE CULTURE WILL BE HELD FOR 5 DAYS BEFORE ISSUING A FINAL NEGATIVE REPORT     Report Status PENDING   Incomplete   URINE CULTURE     Status: Normal   Collection Time   04/13/12 12:30 PM      Component Value Range Status Comment   Specimen Description URINE, RANDOM   Final    Special Requests NONE   Final    Culture  Setup Time 04/13/2012 12:59   Final    Colony Count NO GROWTH   Final    Culture NO GROWTH   Final    Report Status 04/14/2012 FINAL   Final    Studies/Results: Ct Abdomen Pelvis Wo Contrast  04/13/2012  *RADIOLOGY REPORT*  Clinical Data: As on recent ultrasound exam.  CT ABDOMEN AND PELVIS WITHOUT CONTRAST  Technique:  Multidetector CT imaging of the abdomen and pelvis was performed following the standard protocol without intravenous contrast.  Comparison: Ultrasound exam from yesterday.  Findings: Intravenous contrast was withheld, presumably secondary to the patient's renal insufficiency.  Images which include the lower chest show asymmetric elevation of the right hemidiaphragm with some atelectasis at the right base.  As seen at ultrasound, the patient has a dominant, heterogeneous lesion in the central liver.  This is somewhat difficult to assess given the lack of intravenous contrast material, but it appears to arise from the caudate lobe and measures 14 x 10 x 12 cm. Other smaller lesions are scattered about the liver, including a 4.0 cm lesion in the inferior liver just anterior to the gallbladder and a lateral 1.9 cm lesion at the dome of the right liver.  Calcified granulomatous disease is seen in the spleen.  The stomach is decompressed.  The duodenum, pancreas, gallbladder, and adrenal glands are unremarkable.  Cortical scarring is noted in both kidneys without renal stone or hydronephrosis.  No definite renal mass is evident.  No abdominal aortic aneurysm.  No bulky retroperitoneal or intraperitoneal lymphadenopathy.  No evidence for bowel obstruction.  Imaging through the pelvis shows no free intraperitoneal fluid. There is no pelvic sidewall  lymphadenopathy.  The prostate gland is enlarged.  No evidence for colonic mass.  The terminal ileum and the appendix are normal.  Bone windows reveal no worrisome lytic or sclerotic osseous lesions.  IMPRESSION: Dominant heterogeneous mass lesion in the central liver, apparently arising from the caudate lobe, measures up to 14  cm in diameter and generating mass effect in the porta hepatis and central abdomen. Other smaller hypoattenuating lesions are seen elsewhere in the liver parenchyma.  These changes may be related to primary liver neoplasm although no underlying features are evident on today's CT scan to suggest cirrhosis.  Metastatic disease would also be a consideration although no primary lesion is evident in the abdomen or pelvis.  The lack of intravenous contrast on today's study hinders assessment.   Original Report Authenticated By: Kennith Center, M.D.    US Abdomen Complete  04/12/2012  *RADIOLOGY REPORT*  Clinical Data:  67 year old male with abdominal pain and distention, elevated LFTs and acute renal failure.  ABDOMINAL ULTRASOUND COMPLETE  Comparison:  None  Findings:  Gallbladder: Gallbladder sludge is noted.  There is no evidence of gallstones, gallbladder wall thickening, or pericholecystic fluid.  Common Bile Duct:  There is no evidence of intrahepatic or extrahepatic biliary dilation. The CBD measures 2.1 mm in greatest diameter.  Liver: A 15.4 x 9.8 x 14.1 cm heterogeneous solid mass is identified within the abdomen with mass effect or origin within the porta hepatis/right liver.  There are several hyperechoic masses within the liver also identified which are slightly irregular.  IVC:  Appears normal.  Pancreas: The pancreas is not well visualized.  Spleen:  Within normal limits in size and echotexture.  Right kidney:  The right kidney is normal in size measuring 11.2 cm.  Upper limits of normal renal echogenicity noted.  Mild cortical atrophy is present.  There is no evidence of solid  mass, hydronephrosis or definite renal calculi.  Left kidney:  The left kidney is normal in size measuring 12 cm. Upper limits of normal renal echogenicity noted.  There is no evidence of solid mass, hydronephrosis or definite renal calculi. Mild cortical atrophy is noted.  Abdominal Aorta:  No abdominal aortic aneurysm identified the abdominal aorta is difficult to fully visualize.  There is no evidence of ascites.  IMPRESSION: 15.4 x 9.8 x 14.1 cm mass situated in the region of the porta hepatis which involves originates from the liver. Hyperechoic hepatic lesions are suspicious for metastatic disease. Abdominal/pelvic CT with contrast is recommended for further evaluation.  Upper limits of normal renal echogenicity which may be related to medical renal disease.  No evidence of hydronephrosis.  Gallbladder sludge without evidence of cholelithiasis or cholecystitis.   Original Report Authenticated By: Harmon Pier, M.D.    Medications: I have reviewed the patient's current medications. Scheduled Meds:   . antiseptic oral rinse  15 mL Mouth Rinse BID  . aspirin EC  81 mg Oral Daily  . levofloxacin (LEVAQUIN) IV  750 mg Intravenous Q24H  . metroNIDAZOLE  500 mg Intravenous Q8H  . sodium chloride  3 mL Intravenous Q12H   Continuous Infusions:  PRN Meds:.acetaminophen, acetaminophen, ondansetron (ZOFRAN) IV, ondansetron, oxyCODONE-acetaminophen Assessment/Plan: Shortness of breath. Likely multifactorial due to PNA and mass effect from liver mass with elevated right hemidiaphragm.  His pro-BNP is only mildly elevate to ~700 with no sign of volume overload on physical exam or on CXR, making CHF exacerbation less likely. Influenza PCR NS negative. UDS negative.  -Continue telemetry  -PNA treatment per below  -Continued Tamiflu (renally dosed)  -Zofran PRN    Pneumonia, community acquired pneumonia. He had subjective fever for days prior to his presentation with decreased intake per mouth, his CXR shows  right basilar atelectasis or infiltrate. Although his physical exam is benign, his symptoms at his presentation are concerning  for CAP and he will be treated empirically. Urine Strep pneumoniae and Legionella antigen negative.  -Continue IV levaquin while patient has nausea (dosed per pharmacy)  -blood cultures x2 NGTD -f/u sputum culture   Intraabdominal mass. His abdominal CT shows heterogeneous mass in the central liver, arising from the caudate lobe, 14 cm in diameter and generating mass effect in the porta hepatis and central abdomen. Other smaller hypoattenuating lesions are seen elsewhere in the liver parenchyma. This is certainly concerning for liver malignancy (primary or secondary) but biopsy for pathology analysis indicated. IR consulted to US guided biopsy which is scheduled for tomorrow.  -Continued flagyl  -Possible IR drainage/biopsy tomorrow   Leukocytosis. WBC of 18.6 on admission. Likely 2/2 to CAP or liver abscess per above. He was febrile yesterday with Tmax of 100.36F but has afebrile since then. WBC down to 14.0 today. UA with trace leucocytes, many bacteria, rare squamous cells, concerning for UTI, althought he denies urinary symptoms. Urine culture with NGTD.  -Continue Levaquin per above  -f/u Urine culture  -CBC daily   Elevated LFTs. Alk phos of 179, AST 75, and t bili at 1.4 on admission. He denies drinking alcohol or using illicit drugs. He has not history of traveling outside the [place]. He has had poor intake per mouth, SOB, distended abdomen, anorexia, diarrhea. His hepatitis panel is negative, HIV also negative. He has intraabdominal mass concerning for liver abscess v malignancy per above. Her has increased PT/INR today despite holding coumadin but his LFTs have improved slightly.  -IR liver  biopsy per above.   Acute Renal Failure. Baseline creatinine unknown but creatinine on admission at 2.54 with BUN 49, bicarb 22, and K of 5.0. Most likely prerenal azotemia in the  setting of creased intake per mouth for 5 days. He denies urinary retention of dysuria. FeNa <1%. Cr trended further down to 1.3 today. His Mg is elevated at 2.6 (he reports recent use of Epson salts for constipation), phos normal at 3.0.  -Strict I&O  -CBM in AM  -D/cd IVF hydration with NS@100ml /hr   Hypotension. Stable. BP of 110/72 this afternoon. On presentation, 78/62 in Urgent Care, up to 106/83 with 1L NS bolus. He is on metoprolol ER, lisinopril-HCTZ, and spironolactone at home and assures compliance with these medications even while sick.  -Hold all antihypertensives at this time   DM2. Hgb A1C of 6.5%. He denies ever being diagnosed with DM2.  -CBG checks  -SSI sensitive   CHF/ischemic cardiomyopathy/ICD pacemaker. He does not appear volume overloaded on exam with no edema on CXR. No 2D echo on file, he has not seen a Cardiologist in years. He has PVCs on EKG but no active chest pain. He reports that his ICD pacemaker has not fired in years.  -Telemetry  -Attempt to obtain records from PCP  -Will consider 2D echo  -Will reassess volume status in AM   Anticoagulation therapy. He is on coumadin which is managed by his PCP. It is unclear why he is on this therapy, he thinks it is related to his stroke years ago. His INR is 3.68 on admission but up to 5.85 despite holding warfarin. INR of >10 this morning,despite vitamin K 5mg  IV yesterday. Pt denied bleeding. Vitamin K 10mg  IV given, repeat INR of 1.7.  -hold in the setting of supratherapeutic INR  -PT/INR in AM  -Will contact his PCP for anti-coagulation therapy clarification  - need INR<2 for IR procedure.   DVT prophylaxis. On coumadin  FEN:  NSL Replete as needed  Regular diet, NPO after midnight  Dispo: Disposition is deferred at this time, awaiting improvement of current medical problems. Anticipated discharge in approximately 2-3 day(s).   The patient does have a current PCP Karle Plumber, MD), therefore will not be  requiring OPC follow-up after discharge.  The patient does not have transportation limitations that hinder transportation to clinic appointments.  .Services Needed at time of discharge: Y = Yes, Blank = No  PT:    OT:    RN:    Equipment:    Other:        LOS: 2 days   Jonathan Reed D 04/14/2012, 6:38 PM

## 2012-04-14 NOTE — Progress Notes (Signed)
ANTIBIOTIC CONSULT NOTE - FOLLOW UP  Pharmacy Consult for Levaquin Indication: pneumonia coverage  No Known Allergies  Patient Measurements: Height: 5' 9.29" (176 cm) Weight: 195 lb 12.3 oz (88.8 kg) IBW/kg (Calculated) : 71.37   Vital Signs: Temp: 98 F (36.7 C) (01/28 1350) Temp src: Oral (01/28 1350) BP: 128/76 mmHg (01/28 1350) Pulse Rate: 76  (01/28 1350) Intake/Output from previous day: 01/27 0701 - 01/28 0700 In: -  Out: 1425 [Urine:1425] Intake/Output from this shift: Total I/O In: 483 [P.O.:480; I.V.:3] Out: -   Labs:  Basename 04/14/12 0823 04/13/12 0415 04/12/12 2049 04/12/12 1254  WBC 14.0* 15.6* -- 18.6*  HGB 12.3* 12.7* -- 14.2  PLT 196 170 -- 165  LABCREA -- -- 184.28 --  CREATININE 1.31 1.66* -- 2.54*   Estimated Creatinine Clearance: 63.2 ml/min (by C-G formula based on Cr of 1.31).  Assessment:  Day # 3 Levaquin 750 mg IV q48hrs  Renal function has improved.  Influenza PCR negative, Tamiflu dc'd.   Coumadin now on hold for liver lesion biopsy. Supratherapeutic INR on admit 1/26/  Vitamin K 5 mg IV given 1/27 pm and 10 mg IV this morning.  Goal of Therapy:   appropriate dosing for renal function.  Plan:   Will adjust Levaquin to 750 mg IV q24hrs.  Continue to follow renal function.   Will follow-up for resuming Coumadin when no further invasive procedures are needed.   Dennie Fetters, [place] Pager: [phone] 04/14/2012,4:30 PM

## 2012-04-14 NOTE — Progress Notes (Signed)
INTERNAL MEDICINE TEACHING SERVICE Attending Note  Date: 04/14/2012  Patient name: Jonathan Reed  Medical record number: [id number]  Date of birth: 1945/07/18    This patient has been seen and discussed with the house staff. Please see their note for complete details. I concur with their findings with the following additions/corrections: This morning admits to some nausea. Denies any fever. Denies any shortness of breath. At this time is being treated for community acquired pneumonia. Furthermore his were blood cell count is improving. Follow his blood cultures. His renal function is improving likely a component of prerenal azotemia. Repeat INR shows prior value was likely an error. At this time the plan is to biopsy large liver mass. Patient agrees with risks and benefits for this procedure for diagnosis.   [name], DO  04/14/2012, 4:34 PM

## 2012-04-15 ENCOUNTER — Encounter (HOSPITAL_COMMUNITY): Payer: Self-pay | Admitting: *Deleted

## 2012-04-15 ENCOUNTER — Inpatient Hospital Stay (HOSPITAL_COMMUNITY): Payer: Medicaid Other

## 2012-04-15 LAB — BASIC METABOLIC PANEL
BUN: 26 mg/dL — ABNORMAL HIGH (ref 6–23)
Chloride: 100 mEq/L (ref 96–112)
GFR calc Af Amer: 68 mL/min — ABNORMAL LOW (ref >90)
GFR calc non Af Amer: 59 mL/min — ABNORMAL LOW (ref >90)
Potassium: 4.3 mEq/L (ref 3.5–5.1)
Sodium: 137 mEq/L (ref 135–145)

## 2012-04-15 LAB — CBC
HCT: 33.5 % — ABNORMAL LOW (ref 39.0–52.0)
MCV: 90.5 fL (ref 78.0–100.0)
Platelets: 209 10*3/uL (ref 150–400)
RBC: 3.7 MIL/uL — ABNORMAL LOW (ref 4.22–5.81)
WBC: 13.4 10*3/uL — ABNORMAL HIGH (ref 4.0–10.5)

## 2012-04-15 LAB — PROTIME-INR: INR: 1.51 — ABNORMAL HIGH (ref 0.00–1.49)

## 2012-04-15 MED ORDER — WARFARIN SODIUM 5 MG PO TABS
5.0000 mg | ORAL_TABLET | Freq: Once | ORAL | Status: AC
  Administered 2012-04-15: 5 mg via ORAL
  Filled 2012-04-15: qty 1

## 2012-04-15 MED ORDER — WARFARIN - PHARMACIST DOSING INPATIENT: Freq: Every day | Status: DC

## 2012-04-15 MED ORDER — MIDAZOLAM HCL 2 MG/2ML IJ SOLN
INTRAMUSCULAR | Status: AC
  Filled 2012-04-15: qty 4

## 2012-04-15 MED ORDER — FENTANYL CITRATE 0.05 MG/ML IJ SOLN
INTRAMUSCULAR | Status: AC
  Filled 2012-04-15: qty 4

## 2012-04-15 MED ORDER — MIDAZOLAM HCL 2 MG/2ML IJ SOLN
INTRAMUSCULAR | Status: AC | PRN
  Administered 2012-04-15: 1 mg via INTRAVENOUS

## 2012-04-15 MED ORDER — BISACODYL 10 MG RE SUPP
10.0000 mg | Freq: Every day | RECTAL | Status: DC | PRN
  Administered 2012-04-16: 10 mg via RECTAL
  Filled 2012-04-15: qty 1

## 2012-04-15 MED ORDER — POLYETHYLENE GLYCOL 3350 17 G PO PACK
17.0000 g | PACK | Freq: Every day | ORAL | Status: DC
  Administered 2012-04-15 – 2012-04-16 (×2): 17 g via ORAL
  Filled 2012-04-15 (×2): qty 1

## 2012-04-15 MED ORDER — LEVOFLOXACIN 750 MG PO TABS
750.0000 mg | ORAL_TABLET | Freq: Every day | ORAL | Status: DC
  Administered 2012-04-15 – 2012-04-16 (×2): 750 mg via ORAL
  Filled 2012-04-15 (×2): qty 1

## 2012-04-15 MED ORDER — HYDROCODONE-ACETAMINOPHEN 5-325 MG PO TABS
1.0000 | ORAL_TABLET | ORAL | Status: DC | PRN
  Administered 2012-04-15: 1 via ORAL
  Administered 2012-04-16 (×2): 2 via ORAL
  Filled 2012-04-15: qty 2
  Filled 2012-04-15: qty 1
  Filled 2012-04-15: qty 2

## 2012-04-15 MED ORDER — DOCUSATE SODIUM 100 MG PO CAPS
100.0000 mg | ORAL_CAPSULE | Freq: Every day | ORAL | Status: DC
  Administered 2012-04-15 – 2012-04-16 (×2): 100 mg via ORAL
  Filled 2012-04-15 (×2): qty 1

## 2012-04-15 MED ORDER — FENTANYL CITRATE 0.05 MG/ML IJ SOLN
INTRAMUSCULAR | Status: AC | PRN
  Administered 2012-04-15: 50 ug via INTRAVENOUS

## 2012-04-15 NOTE — Evaluation (Signed)
Occupational Therapy Evaluation Patient Details Name: Jonathan Reed MRN: [id number] DOB: 1945-11-24 Today's Date: 04/15/2012 Time: 1191-4782 OT Time Calculation (min): 14 min  OT Assessment / Plan / Recommendation Clinical Impression  Pt admitted with abdominal pain, liver biopsy today.  Pt demonstrating balance deficits during eval. Will benefit from acute OT services to address below problem list in prep for return home.    OT Assessment  Patient needs continued OT Services    Follow Up Recommendations  Supervision/Assistance - 24 hour;No OT follow up    Barriers to Discharge None    Equipment Recommendations   (tbd)    Recommendations for Other Services    Frequency  Min 2X/week    Precautions / Restrictions Precautions Precautions: Fall Precaution Comments: pt unsteady with gait Restrictions Weight Bearing Restrictions: No   Pertinent Vitals/Pain See vitals    ADL  Grooming: Performed;Wash/dry hands;Min guard Where Assessed - Grooming: Unsupported standing Toilet Transfer: Performed;Min guard Statistician Method: Sit to Barista: Regular height toilet;Grab bars Toileting - Clothing Manipulation and Hygiene: Performed;Modified independent Where Assessed - Toileting Clothing Manipulation and Hygiene: Standing Equipment Used: Gait belt Transfers/Ambulation Related to ADLs: Pt ambulating with PT on OT arrival.  While returning to room with PT/OT, pt with LOB to left side and required min-mod assist to correct. Pt did not seem to be attempting to correct LOB. ADL Comments: balance deficits    OT Diagnosis: Generalized weakness  OT Problem List: Impaired balance (sitting and/or standing);Decreased strength;Pain OT Treatment Interventions: Self-care/ADL training;Therapeutic activities;Balance training;Patient/family education   OT Goals Acute Rehab OT Goals OT Goal Formulation: With patient Time For Goal Achievement: 04/29/12 Potential to  Achieve Goals: Good ADL Goals Pt Will Perform Grooming: Standing at sink;Independently ADL Goal: Grooming - Progress: Goal set today Pt Will Transfer to Toilet: with modified independence;Regular height toilet;Ambulation ADL Goal: Toilet Transfer - Progress: Goal set today Pt Will Perform Tub/Shower Transfer: Tub transfer;with modified independence;Ambulation ADL Goal: Tub/Shower Transfer - Progress: Goal set today Miscellaneous OT Goals Miscellaneous OT Goal #1: Pt will retrieve bathing/dressing items at mod I level with no LOB. 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 for tasks performed Memory Deficits: pt has a difficult time recalling when his strokes were and recalling recent history accurately, daughter corrects him and reports that he is not usually like this Safety/Judgement: Decreased safety judgement for tasks assessed Safety/Judgement - Other Comments: pt with balance issues with ambulation and seems to not  notice or ignore them Awareness of Errors: Assistance required to identify errors made Awareness of Errors - Other Comments: pt lost balance to left side into door and  needed vc's to attend to this Cognition - Other Comments: question if some of this is baseline?    Extremity/Trunk Assessment Right Upper Extremity Assessment RUE ROM/Strength/Tone: WFL for tasks assessed Left Upper Extremity Assessment LUE ROM/Strength/Tone: WFL for tasks assessed Right Lower Extremity Assessment RLE ROM/Strength/Tone: WFL for tasks assessed RLE Sensation: WFL - Light Touch;WFL - Proprioception RLE Coordination: WFL - gross motor Left Lower Extremity Assessment LLE ROM/Strength/Tone: WFL for tasks assessed LLE Sensation: WFL - Light Touch;WFL - Proprioception LLE Coordination: WFL - gross motor Trunk Assessment Trunk Assessment: Normal     Mobility Bed Mobility Bed Mobility: Not assessed Transfers Transfers: Sit to Stand;Stand to Sit Sit to Stand: 4: Min guard;From bed;With upper extremity assist Stand to Sit: 4: Min guard;To bed;To toilet;With upper extremity assist Details for Transfer Assistance: pt reaches for table to steady himself with initial standing, has sheet wrapped around his legs with initial standing and does not realize it, therapist cued him to wait to remove it     Shoulder Instructions     Exercise     Balance Balance Balance Assessed: Yes Dynamic Standing Balance Dynamic Standing - Balance Support: No upper extremity supported;During functional activity Dynamic Standing - Level of Assistance: 4: Min assist   End of Session OT - End of Session Activity Tolerance: Patient tolerated treatment well Patient left: in bed;with call bell/phone within reach;with family/visitor present Nurse Communication: Patient requests pain meds  GO    04/15/2012 Cipriano Mile OTR/L Pager [phone] Office [phone]  Cipriano Mile 04/15/2012, 5:01 PM

## 2012-04-15 NOTE — Progress Notes (Signed)
ANTICOAGULATION & ANTIBIOTIC CONSULT NOTE - Follow Up Consult  Pharmacy Consult for Coumadin/Levaquin Indication: hx stroke and CAP coverage  No Known Allergies  Patient Measurements: Height: 5' 9.29" (176 cm) Weight: 195 lb 12.3 oz (88.8 kg) IBW/kg (Calculated) : 71.37   Vital Signs: Temp: 98.2 F (36.8 C) (01/29 0901) Temp src: Oral (01/29 0901) BP: 112/83 mmHg (01/29 1031) Pulse Rate: 50  (01/29 1031)  Labs:  Basename 04/15/12 0700 04/14/12 0828 04/14/12 0823 04/14/12 0635 04/13/12 0415  HGB 11.4* -- 12.3* -- --  HCT 33.5* -- 35.4* -- 35.1*  PLT 209 -- 196 -- 170  APTT 42* -- -- -- --  LABPROT 17.8* 19.5* -- >90.0* --  INR 1.51* 1.71* -- >10.00* --  HEPARINUNFRC -- -- -- -- --  CREATININE 1.26 -- 1.31 -- 1.66*  CKTOTAL -- -- -- -- --  CKMB -- -- -- -- --  TROPONINI -- -- -- -- --    Estimated Creatinine Clearance: 65.7 ml/min (by C-G formula based on Cr of 1.26).  Assessment:   Admitted with supratherapeutic INR on home Coumadin regimen of 5 mg daily; INR continued to rise, and Vitamin K 5 mg IV was given 1/28.  Vitamin 10 mg IV also given 1/29 am to allow for biopsy of liver lesion. Now s/p liver biopsy and Coumadin to resume tonight.  May be more sensitive to Coumadin while on Flagyl.    Day # 4 of 7 Levaquin.  To change from IV to PO today. Renal function improved since admission, and increased from q48hrs to q24hrs 1/28.  Goal of Therapy:  INR 2-3 Monitor platelets by anticoagulation protocol: Yes Appropriate Levaquin dose for renal function   Plan:   Coumadin 5 mg tonight.  Continue daily PT/INR.  Watch for increased sensitivity while on Flagyl.  Monitor for any sx/sx of bleeding.    Change Levaquin to 750 mg PO q24hrs for total of 7 days of therapy.  Last dose scheduled for 2/1.  Will follow up renal function for any need to further adjust.  Dennie Fetters, RPh Page: [phone] 04/15/2012,12:47 PM

## 2012-04-15 NOTE — Progress Notes (Signed)
Agree with PA note.   INR down to 1.5, will proceed with a plan for gel foam embolization of the biopsy tract.  Signed,  Sterling Big, MD Vascular & Interventional Radiologist [facility] Radiology

## 2012-04-15 NOTE — Progress Notes (Signed)
Physical Therapy Evaluation Patient Details Name: Jonathan Reed MRN: [id number] DOB: May 12, 1945 Today's Date: 04/15/2012 Time: 4098-1191 PT Time Calculation (min): 26 min  PT Assessment / Plan / Recommendation Clinical Impression  Pt is 67 yo male with abdominal pain, liver biopsy today. Pt was independent PTA and ambulated 200' but showed imbalance second half of ambulation with one LOB needing min A to attend to and correct. Pt also with mild cognitive deficits that family reports is new for him. Recommend acute PT to address balance and recommend HHPT at d/c.    PT Assessment  Patient needs continued PT services    Follow Up Recommendations  Home health PT;Supervision for mobility/OOB    Does the patient have the potential to tolerate intense rehabilitation      Barriers to Discharge None      Equipment Recommendations  None recommended by PT    Recommendations for Other Services     Frequency Min 3X/week    Precautions / Restrictions Precautions Precautions: Fall Precaution Comments: pt unsteady with gait Restrictions Weight Bearing Restrictions: No   Pertinent Vitals/Pain 8/10 abdominal pain, RN notified      Mobility  Bed Mobility Bed Mobility: Supine to Sit;Sitting - Scoot to Edge of Bed Supine to Sit: 6: Modified independent (Device/Increase time) Sitting - Scoot to Edge of Bed: 6: Modified independent (Device/Increase time) Details for Bed Mobility Assistance: pt able to get to EOB without physical assist Transfers Transfers: Sit to Stand;Stand to Sit Sit to Stand: 4: Min guard;From bed;With upper extremity assist Stand to Sit: 4: Min guard;To bed;To toilet;With upper extremity assist Details for Transfer Assistance: pt reaches for table to steady himself with initial standing, has sheet wrapped around his legs with initial standing and does not realize it, therapist cued him to wait to remove it Ambulation/Gait Ambulation/Gait Assistance: 4: Min  assist Ambulation Distance (Feet): 200 Feet Assistive device: None Ambulation/Gait Assistance Details: pt ambulated first 100' with no LOB but reached for left rail whenever it was available. Second half of ambulation, pt began to fatigue, became less steady with walking and then lost balance to left with min A to correct as he re-entered his room Gait Pattern: Step-through pattern;Narrow base of support Gait velocity: decreased Stairs: No Wheelchair Mobility Wheelchair Mobility: No    Shoulder Instructions     Exercises     PT Diagnosis: Abnormality of gait;Acute pain  PT Problem List: Decreased balance;Decreased mobility;Decreased activity tolerance;Pain;Decreased safety awareness PT Treatment Interventions: Gait training;Stair training;Functional mobility training;Therapeutic activities;Therapeutic exercise;Balance training;Neuromuscular re-education;Cognitive remediation;Patient/family education   PT Goals Acute Rehab PT Goals PT Goal Formulation: With patient Time For Goal Achievement: 04/29/12 Potential to Achieve Goals: Good Pt will go Sit to Stand: with modified independence PT Goal: Sit to Stand - Progress: Goal set today Pt will go Stand to Sit: with modified independence PT Goal: Stand to Sit - Progress: Goal set today Pt will Ambulate: >150 feet;with modified independence PT Goal: Ambulate - Progress: Goal set today Pt will Go Up / Down Stairs: 3-5 stairs;with supervision PT Goal: Up/Down Stairs - Progress: Goal set today  Visit Information  Last PT Received On: 04/15/12 Assistance Needed: +1    Subjective Data  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 even get up out of bed Patient Stated Goal: return home   Prior Functioning  Home Living Lives With: Spouse Available Help at Discharge: Family;Available 24 hours/day Type of Home: House Home Access: Stairs to enter Entrance  Stairs-Number of Steps: 4 Entrance Stairs-Rails:  None Home Layout: One level Bathroom Shower/Tub: Network engineer: None Prior Function Level of Independence: Independent Able to Take Stairs?: Yes Driving: Yes Vocation: Part time 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 Communication Communication: Other (comment) (mild speech issues from past CVA's)    Cognition  Overall Cognitive Status: Impaired Area of Impairment: Memory;Safety/judgement;Awareness of errors Arousal/Alertness: Awake/alert Orientation Level: Appears intact for tasks assessed Behavior During Session: [facility] for tasks performed Memory Deficits: pt has a difficult time recalling when his strokes were and recalling recent history accurately, daughter corrects him and reports that he is not usually like this Safety/Judgement: Decreased safety judgement for tasks assessed Safety/Judgement - Other Comments: pt with balance issues with ambulation and seems to not notice or ignore them Awareness of Errors: Assistance required to identify errors made Awareness of Errors - Other Comments: pt lost balance to left side into door and needed vc's to attend to this Cognition - Other Comments: question if some of this is baseline?    Extremity/Trunk Assessment Right Upper Extremity Assessment RUE ROM/Strength/Tone: [facility] for tasks assessed Left Upper Extremity Assessment LUE ROM/Strength/Tone: WFL for tasks assessed Right Lower Extremity Assessment RLE ROM/Strength/Tone: WFL for tasks assessed RLE Sensation: WFL - Light Touch;WFL - Proprioception RLE Coordination: WFL - gross motor Left Lower Extremity Assessment LLE ROM/Strength/Tone: WFL for tasks assessed LLE Sensation: WFL - Light Touch;WFL - Proprioception LLE Coordination: WFL - gross motor Trunk Assessment Trunk Assessment: Normal   Balance Balance Balance Assessed: Yes Dynamic Standing Balance Dynamic Standing - Balance Support: No upper extremity  supported;During functional activity Dynamic Standing - Level of Assistance: 4: Min assist  End of Session PT - End of Session Equipment Utilized During Treatment: Gait belt Activity Tolerance: Patient tolerated treatment well Patient left: in bed;with call bell/phone within reach;with family/visitor present Nurse Communication: Patient requests pain meds  GP   Lyanne Co, PT  Acute Rehab Services  [phone]   Lyanne Co 04/15/2012, 4:55 PM

## 2012-04-15 NOTE — Progress Notes (Addendum)
Subjective: He tolerated his US guided liver biopsy by IR well. His abdominal discomfort is still present. He is hungry with minimum nausea.   He denies SOB, chest pain, back pain, dysuria, or diarrhea, hematuria, hematochezia, or melena.    Objective: Vital signs in last 24 hours: Filed Vitals:   04/15/12 0820 04/15/12 0825 04/15/12 0830 04/15/12 0901  BP: 125/83 117/97 125/101 130/77  Pulse: 92 90 99 77  Temp:    98.2 F (36.8 C)  TempSrc:    Oral  Resp: 22 21 14 16   Height:      Weight:      SpO2: 99% 99% 98% 94%   Weight change:   Intake/Output Summary (Last 24 hours) at 04/15/12 0958 Last data filed at 04/15/12 0700  Gross per 24 hour  Intake   1080 ml  Output    450 ml  Net    630 ml   Vitals reviewed.  General: resting in bed, in NAD, his wife at his bedside  HEENT: Wearing bifocals, PERRL, no scleral icterus  Cardiac: irregular rhythm with dropped beats, no rubs, murmurs or gallops  Chest: ICD implant noted on left chest  Pulm: clear to auscultation bilaterally, no wheezes, rales, or rhonchi  Abd: obese, soft, TTP in the epigastric area, mildly distended, no guarding or rebound tenderness, normal BS present. Band-aid at the RUQ/flank at site of biopsy that is d/c/i with no bleeding noted.  Ext: warm and well perfused, no pedal edema  Neuro: alert and oriented X3, cranial nerves II-XII grossly intact, strength and sensation to light touch equal in bilateral upper and lower extremities  Lab Results: Basic Metabolic Panel:  Lab 04/15/12 1610 04/14/12 0823 04/12/12 1717  NA 137 135 --  K 4.3 3.9 --  CL 100 100 --  CO2 25 23 --  GLUCOSE 101* 96 --  BUN 26* 29* --  CREATININE 1.26 1.31 --  CALCIUM 9.3 9.4 --  MG -- -- 2.6*  PHOS -- -- 3.0   Liver Function Tests:  Lab 04/14/12 0823 04/13/12 0415  AST 49* 57*  ALT 45 42  ALKPHOS 190* 166*  BILITOT 2.1* 1.0  PROT 7.1 7.2  ALBUMIN 2.7* 2.7*    Lab 04/12/12 1254  LIPASE 17  AMYLASE --   CBC:  Lab  04/15/12 0700 04/14/12 0823 04/12/12 1254  WBC 13.4* 14.0* --  NEUTROABS -- -- 14.0*  HGB 11.4* 12.3* --  HCT 33.5* 35.4* --  MCV 90.5 91.0 --  PLT 209 196 --   BNP:  Lab 04/12/12 1505  PROBNP 771.7*   Hemoglobin A1C:  Lab 04/12/12 1717  HGBA1C 6.5*   Coagulation:  Lab 04/15/12 0700 04/14/12 0828 04/14/12 0635 04/13/12 0415  LABPROT 17.8* 19.5* >90.0* 48.3*  INR 1.51* 1.71* >10.00* 5.85*   Urine Drug Screen: Drugs of Abuse     Component Value Date/Time   LABOPIA NONE DETECTED 04/12/2012 2049   COCAINSCRNUR NONE DETECTED 04/12/2012 2049   LABBENZ NONE DETECTED 04/12/2012 2049   AMPHETMU NONE DETECTED 04/12/2012 2049   THCU NONE DETECTED 04/12/2012 2049   LABBARB NONE DETECTED 04/12/2012 2049    Alcohol Level:  Lab 04/12/12 1505  ETH <11   Urinalysis:  Lab 04/12/12 2049  COLORURINE AMBER*  LABSPEC 1.025  PHURINE 5.0  GLUCOSEU NEGATIVE  HGBUR SMALL*  BILIRUBINUR NEGATIVE  KETONESUR NEGATIVE  PROTEINUR 30*  UROBILINOGEN 1.0  NITRITE NEGATIVE  LEUKOCYTESUR TRACE*    Micro Results: Recent Results (from the past 240 hour(s))  CULTURE, BLOOD (ROUTINE X 2)     Status: Normal (Preliminary result)   Collection Time   04/12/12  5:25 PM      Component Value Range Status Comment   Specimen Description BLOOD LEFT HAND   Final    Special Requests BOTTLES DRAWN AEROBIC AND ANAEROBIC 10CC   Final    Culture  Setup Time 04/13/2012 01:04   Final    Culture     Final    Value:        BLOOD CULTURE RECEIVED NO GROWTH TO DATE CULTURE WILL BE HELD FOR 5 DAYS BEFORE ISSUING A FINAL NEGATIVE REPORT   Report Status PENDING   Incomplete   CULTURE, BLOOD (ROUTINE X 2)     Status: Normal (Preliminary result)   Collection Time   04/12/12  5:35 PM      Component Value Range Status Comment   Specimen Description BLOOD LEFT HAND   Final    Special Requests BOTTLES DRAWN AEROBIC AND ANAEROBIC 10CC   Final    Culture  Setup Time 04/13/2012 01:04   Final    Culture     Final    Value:         BLOOD CULTURE RECEIVED NO GROWTH TO DATE CULTURE WILL BE HELD FOR 5 DAYS BEFORE ISSUING A FINAL NEGATIVE REPORT   Report Status PENDING   Incomplete   URINE CULTURE     Status: Normal   Collection Time   04/13/12 12:30 PM      Component Value Range Status Comment   Specimen Description URINE, RANDOM   Final    Special Requests NONE   Final    Culture  Setup Time 04/13/2012 12:59   Final    Colony Count NO GROWTH   Final    Culture NO GROWTH   Final    Report Status 04/14/2012 FINAL   Final    Studies/Results: Ct Abdomen Pelvis Wo Contrast  04/13/2012  *RADIOLOGY REPORT*  Clinical Data: As on recent ultrasound exam.  CT ABDOMEN AND PELVIS WITHOUT CONTRAST  Technique:  Multidetector CT imaging of the abdomen and pelvis was performed following the standard protocol without intravenous contrast.  Comparison: Ultrasound exam from yesterday.  Findings: Intravenous contrast was withheld, presumably secondary to the patient's renal insufficiency.  Images which include the lower chest show asymmetric elevation of the right hemidiaphragm with some atelectasis at the right base.  As seen at ultrasound, the patient has a dominant, heterogeneous lesion in the central liver.  This is somewhat difficult to assess given the lack of intravenous contrast material, but it appears to arise from the caudate lobe and measures 14 x 10 x 12 cm. Other smaller lesions are scattered about the liver, including a 4.0 cm lesion in the inferior liver just anterior to the gallbladder and a lateral 1.9 cm lesion at the dome of the right liver.  Calcified granulomatous disease is seen in the spleen.  The stomach is decompressed.  The duodenum, pancreas, gallbladder, and adrenal glands are unremarkable.  Cortical scarring is noted in both kidneys without renal stone or hydronephrosis.  No definite renal mass is evident.  No abdominal aortic aneurysm.  No bulky retroperitoneal or intraperitoneal lymphadenopathy.  No evidence for  bowel obstruction.  Imaging through the pelvis shows no free intraperitoneal fluid. There is no pelvic sidewall lymphadenopathy.  The prostate gland is enlarged.  No evidence for colonic mass.  The terminal ileum and the appendix are normal.  Bone windows reveal  no worrisome lytic or sclerotic osseous lesions.  IMPRESSION: Dominant heterogeneous mass lesion in the central liver, apparently arising from the caudate lobe, measures up to 14 cm in diameter and generating mass effect in the porta hepatis and central abdomen. Other smaller hypoattenuating lesions are seen elsewhere in the liver parenchyma.  These changes may be related to primary liver neoplasm although no underlying features are evident on today's CT scan to suggest cirrhosis.  Metastatic disease would also be a consideration although no primary lesion is evident in the abdomen or pelvis.  The lack of intravenous contrast on today's study hinders assessment.   Original Report Authenticated By: Kennith Center, M.D.    Medications: I have reviewed the patient's current medications. Scheduled Meds:   . antiseptic oral rinse  15 mL Mouth Rinse BID  . aspirin EC  81 mg Oral Daily  . fentaNYL      . levofloxacin (LEVAQUIN) IV  750 mg Intravenous Q24H  . metroNIDAZOLE  500 mg Intravenous Q8H  . midazolam      . sodium chloride  3 mL Intravenous Q12H   Continuous Infusions:  PRN Meds:.acetaminophen, acetaminophen, HYDROcodone-acetaminophen, ondansetron (ZOFRAN) IV, ondansetron, oxyCODONE-acetaminophen Assessment/Plan: Shortness of breath. Likely multifactorial due to PNA and mass effect from liver mass with elevated right hemidiaphragm. His pro-BNP is only mildly elevate to ~700 with no sign of volume overload on physical exam or on CXR, making CHF exacerbation less likely. Influenza PCR NS negative. UDS negative.  -Continue telemetry  -PNA treatment per below  -Discontinued Tamiflu (renally dosed)  -Zofran PRN   Pneumonia, community acquired  pneumonia. He had subjective fever for days prior to his presentation with decreased intake per mouth, his CXR shows right basilar atelectasis or infiltrate. Although his physical exam is benign, his symptoms at his presentation are concerning for CAP and he will be treated empirically. Urine Strep pneumoniae and Legionella antigen negative.  -Continue IV levaquin while patient has nausea (dosed per pharmacy)  -blood cultures x2 NGTD  -f/u sputum culture   Intraabdominal mass. His abdominal CT shows heterogeneous mass in the central liver, arising from the caudate lobe, 14 cm in diameter and generating mass effect in the porta hepatis and central abdomen. Other smaller hypoattenuating lesions are seen elsewhere in the liver parenchyma. This is certainly concerning for liver malignancy (primary or secondary) but biopsy for pathology analysis indicated. He is s/p [place] core biopsy of liver mass by IR.   -Per GI PA, formal consult will be made after pathology report from biopsy performed today -Continued flagyl  -Pathology pending, ordered fungal smear and culture, gram stain and culture, pathology/cytology  Leukocytosis. WBC of 18.6 on admission. Likely 2/2 to CAP or liver abscess per above. He was febrile yesterday with Tmax of 100.48F but has afebrile since then. WBC down to 14.0 today. UA with trace leucocytes, many bacteria, rare squamous cells, concerning for UTI, althought he denies urinary symptoms. Urine culture with NGTD.  -Continue Levaquin per above  -f/u Urine culture  -CBC daily   Elevated LFTs. Alk phos of 179, AST 75, and t bili at 1.4 on admission. He denies drinking alcohol or using illicit drugs. He has not history of traveling outside the [place]. He has had poor intake per mouth, SOB, distended abdomen, anorexia, diarrhea. His hepatitis panel is negative, HIV also negative. He has intraabdominal mass concerning for liver abscess v malignancy per above. Her had increased PT/INR today despite  holding coumadin but his LFTs have improved slightly.  -  IR liver biopsy per above.   Acute Renal Failure. Baseline creatinine unknown but creatinine on admission at 2.54 with BUN 49, bicarb 22, and K of 5.0. Most likely prerenal azotemia in the setting of creased intake per mouth for 5 days. He denies urinary retention of dysuria. FeNa <1%. Cr trended further down to 1.26 today. His Mg is elevated at 2.6 (he reports recent use of Epson salts for constipation), phos normal at 3.0.  -Strict I&O  -CBM in AM    Hypotension. Stable. BP of 112/83 this afternoon. On presentation, 78/62 in Urgent Care, up to 106/83 with 1L NS bolus. He is on metoprolol ER, lisinopril-HCTZ, and spironolactone at home and assures compliance with these medications even while sick.  -Hold all antihypertensives at this time   DM2. Hgb A1C of 6.5%. He denies ever being diagnosed with DM2.  -CBG checks  -SSI sensitive   CHF/ischemic cardiomyopathy/ICD pacemaker. He does not appear volume overloaded on exam with no edema on CXR. No 2D echo on file, he has not seen a Cardiologist in years. He has PVCs on EKG but no active chest pain. He reports that his ICD pacemaker has not fired in years.  -Telemetry  -Attempt to obtain records from PCP  -Will consider 2D echo  -Will reassess volume status in AM   Anticoagulation therapy. He is on coumadin which is managed by his PCP. It is unclear why he is on this therapy, he thinks it is related to his stroke years ago. His INR is 3.68 on admission but up to 5.85 despite holding warfarin. INR of >10 this morning,despite vitamin K 5mg  IV yesterday. Pt denies bleeding. Vitamin K 10mg  IV given, repeat INR of 1.7 down to 1.51 prior to biopsy.   -PT/INR in AM  -Will contact his PCP for anti-coagulation therapy clarification  -warfarin per pharmacy   DVT prophylaxis. On coumadin   FEN: NSL  Replete as needed  Regular diet,  Dispo: Disposition is deferred at this time, awaiting  improvement of current medical problems. Anticipated discharge in approximately 2-3 day(s).  The patient does have a current PCP Karle Plumber, MD), therefore will not be requiring OPC follow-up after discharge.  The patient does not have transportation limitations that hinder transportation to clinic appointments.  .Services Needed at time of discharge: Y = Yes, Blank = No  PT:    OT:    RN:    Equipment:    Other:        LOS: 3 days   Sara Chu D 04/15/2012, 9:58 AM

## 2012-04-15 NOTE — Procedures (Signed)
Interventional Radiology Procedure Note  Procedure: [place] core biopsy of right liver lesion Complications: None Recommendations: - bedrest x 4 hrs - watch for signs of bleeding given INR 1.5 - pathology pending  Signed,  Sterling Big, MD Vascular & Interventional Radiologist [facility] Radiology

## 2012-04-15 NOTE — Progress Notes (Signed)
INTERNAL MEDICINE TEACHING SERVICE Attending Note  Date: 04/15/2012  Patient name: Jonathan Reed  Medical record number: [id number]  Date of birth: March 13, 1946    This patient has been seen and discussed with the house staff. Please see their note for complete details. I concur with their findings with the following additions/corrections: This morning he is status post biopsy of liver mass. He states he has no shortness of breath. He is currently being treated for community-acquired pneumonia. He may treated with Levaquin and converted to oral therapy if he tolerates the tablet. I would appreciate GI input into his liver mass. Pathology will be pending. He complains of some constipation, he'll benefit from stool softener and laxatives. His renal function is slowly improving, he can be given IV fluids and urine output monitored. He to his history of heart failure, IV fluids should be given with caution. His leukocytosis is improving. I would restart his Coumadin 24 hours after the biopsy. [name], DO  04/15/2012, 3:29 PM

## 2012-04-16 ENCOUNTER — Other Ambulatory Visit: Payer: Self-pay | Admitting: Internal Medicine

## 2012-04-16 DIAGNOSIS — I2589 Other forms of chronic ischemic heart disease: Secondary | ICD-10-CM

## 2012-04-16 LAB — COMPREHENSIVE METABOLIC PANEL
ALT: 35 U/L (ref 0–53)
Alkaline Phosphatase: 203 U/L — ABNORMAL HIGH (ref 39–117)
BUN: 21 mg/dL (ref 6–23)
CO2: 25 mEq/L (ref 19–32)
Calcium: 9.3 mg/dL (ref 8.4–10.5)
GFR calc Af Amer: 81 mL/min — ABNORMAL LOW (ref >90)
GFR calc non Af Amer: 70 mL/min — ABNORMAL LOW (ref >90)
Glucose, Bld: 112 mg/dL — ABNORMAL HIGH (ref 70–99)
Potassium: 4.1 mEq/L (ref 3.5–5.1)
Sodium: 136 mEq/L (ref 135–145)

## 2012-04-16 LAB — CBC
MCHC: 35.1 g/dL (ref 30.0–36.0)
RDW: 14.9 % (ref 11.5–15.5)
WBC: 14.9 10*3/uL — ABNORMAL HIGH (ref 4.0–10.5)

## 2012-04-16 MED ORDER — LEVOFLOXACIN 750 MG PO TABS: 750.0000 mg | ORAL_TABLET | Freq: Every day | ORAL | Status: DC

## 2012-04-16 MED ORDER — ONDANSETRON HCL 4 MG PO TABS: 4.0000 mg | ORAL_TABLET | Freq: Four times a day (QID) | ORAL | Status: DC | PRN

## 2012-04-16 MED ORDER — BISACODYL 10 MG RE SUPP: 10.0000 mg | RECTAL | Status: DC | PRN

## 2012-04-16 MED ORDER — HYDROCODONE-ACETAMINOPHEN 5-325 MG PO TABS: 1.0000 | ORAL_TABLET | ORAL | Status: DC | PRN

## 2012-04-16 MED ORDER — WARFARIN SODIUM 5 MG PO TABS: ORAL_TABLET | ORAL | Status: DC

## 2012-04-16 MED ORDER — POLYETHYLENE GLYCOL 3350 17 G PO PACK: 17.0000 g | PACK | Freq: Every day | ORAL | Status: DC

## 2012-04-16 MED ORDER — WARFARIN SODIUM 5 MG PO TABS
5.0000 mg | ORAL_TABLET | Freq: Once | ORAL | Status: DC
  Filled 2012-04-16: qty 1

## 2012-04-16 MED ORDER — DSS 100 MG PO CAPS: 100.0000 mg | ORAL_CAPSULE | Freq: Every day | ORAL | Status: DC

## 2012-04-16 NOTE — Progress Notes (Signed)
ANTICOAGULATION & ANTIBIOTIC CONSULT NOTE - Follow Up Consult  Pharmacy Consult for Coumadin/Levaquin Indication: hx stroke and CAP coverage  No Known Allergies  Patient Measurements: Height: 5' 9.29" (176 cm) Weight: 194 lb 8 oz (88.225 kg) IBW/kg (Calculated) : 71.37   Vital Signs: Temp: 98.8 F (37.1 C) (01/30 0903) Temp src: Oral (01/30 0903) BP: 128/67 mmHg (01/30 0903) Pulse Rate: 83  (01/30 0903)  Labs:  Basename 04/16/12 1016 04/16/12 0500 04/15/12 0700 04/14/12 0828 04/14/12 0823  HGB 12.2* -- 11.4* -- --  HCT 34.8* -- 33.5* -- 35.4*  PLT 261 -- 209 -- 196  APTT -- -- 42* -- --  LABPROT -- 19.5* 17.8* 19.5* --  INR -- 1.71* 1.51* 1.71* --  HEPARINUNFRC -- -- -- -- --  CREATININE 1.09 -- 1.26 -- 1.31  CKTOTAL -- -- -- -- --  CKMB -- -- -- -- --  TROPONINI -- -- -- -- --    Estimated Creatinine Clearance: 75.6 ml/min (by C-G formula based on Cr of 1.09).  Assessment:   Admitted with supratherapeutic INR on home Coumadin regimen of 5 mg daily; INR continued to rise, and Vitamin K 5 mg IV was given 1/28.  Vitamin 10 mg IV also given 1/29 am to allow for biopsy of liver lesion. Now s/p liver biopsy and Coumadin resumed 1/29 per orders. INR trending up some. Flagyl now dc'd.  Discussed Coumadin use with patient, including precautions, potential drug and food interactions and monitoring. Very familiar, on it since ~2008.  PCP at [facility] follows. He would like brand name Coumadin at discharge, most recently on generic. (Brand name used here.)    Day # 5 of 7 Levaquin.  Changed from IV to PO 1/29. Tolerating.  Scr down to 1.09.    Goal of Therapy:  INR 2-3 Monitor platelets by anticoagulation protocol: Yes Appropriate Levaquin dose for renal function   Plan:   Coumadin 5 mg again tonight.  Continue daily PT/INR.  Monitor for any sx/sx of bleeding.  Relayed information to patient from Dr. Garald Braver; he will need further Coumadin Rx from PCP to change  back to brand name.   No new Rx expected here at discharge.    Continue Levaquin to 750 mg PO q24hrs for total of 7 days of therapy.  Last dose scheduled for 2/1.  Confirmed with Dr. Garald Braver.  Dennie Fetters, [place] Page: [phone] 04/16/2012,2:08 PM

## 2012-04-16 NOTE — Progress Notes (Signed)
INTERNAL MEDICINE TEACHING SERVICE Attending Note  Date: 04/16/2012  Patient name: Jonathan Reed  Medical record number: [id number]  Date of birth: 1946/03/10    This patient has been seen and discussed with the house staff. Please see their note for complete details. I concur with their findings with the following additions/corrections:  He complains of constipation and currently is receiving a Dulcolax suppository. He has minimal abdominal discomfort. He is able to tolerate his diet. He is status post biopsy of intra-abdominal mass, with results pending. He would need to have outpatient followup due to the concern for malignancy. I would treat him for a total of 10 days with oral Levaquin for community-acquired pneumonia in this patient with multiple morbidities. Clinically is improving. His elevated LFTs are likely secondary to intrahepatic mass. His renal failure is likely prerenal azotemia, and is slowly trending down.  He needs adequate follow up as an outpatient for this liver mass. Otherwise, if he has a successful bowel movement and family is okay with plan he can no home. His PCP Dr. Kathrynn Speed will need to be notified.  [name], DO  04/16/2012, 1:29 PM

## 2012-04-16 NOTE — Discharge Summary (Signed)
Internal Medicine Teaching [facility] Discharge Note  Name: ARIO MCDIARMID MRN: [id number] DOB: 1945/10/25 67 y.o.  Date of Admission: 04/12/2012 12:05 PM Date of Discharge: 04/16/2012 Attending Physician: No att. providers found  Discharge Diagnosis: Principal Problem:  *Shortness of breath Active Problems:  Ischemic cardiomyopathy  Hypotension  Leukocytosis  Acute renal failure  Supratherapeutic INR  Liver masses   Discharge Medications:   Medication List     As of 04/16/2012 11:02 PM    STOP taking these medications         lisinopril-hydrochlorothiazide 20-25 MG per tablet   Commonly known as: PRINZIDE,ZESTORETIC      spironolactone 25 MG tablet   Commonly known as: ALDACTONE      TAKE these medications         bisacodyl 10 MG suppository   Commonly known as: DULCOLAX   Place 1 suppository (10 mg total) rectally as needed.      DSS 100 MG Caps   Take 100 mg by mouth daily.      HYDROcodone-acetaminophen 5-325 MG per tablet   Commonly known as: NORCO/VICODIN   Take 1-2 tablets by mouth every 4 (four) hours as needed.      levofloxacin 750 MG tablet   Commonly known as: LEVAQUIN   Take 1 tablet (750 mg total) by mouth daily at 12 noon.      metoprolol succinate 25 MG 24 hr tablet   Commonly known as: TOPROL-XL   Take 25 mg by mouth daily.      ondansetron 4 MG tablet   Commonly known as: ZOFRAN   Take 1 tablet (4 mg total) by mouth every 6 (six) hours as needed for nausea.      polyethylene glycol packet   Commonly known as: MIRALAX / GLYCOLAX   Take 17 g by mouth daily.      warfarin 5 MG tablet   Commonly known as: COUMADIN   Take 2.5 mg (half a tablet) daily until 2/3.   Take 5mg  daily starting 2/4.        Disposition and follow-up:   Mr.Andrell T Nudd was discharged from [facility] in stable condition.  At the hospital follow up visit please address: -Reassess his BP and need for antihypertensives  -Recheck his INR  and adjust his warfarin as needed -Follow up on his pathology report -Repeat CMP for Cr trending as well LFTs trending  Follow-up Appointments:     Follow-up Information    Follow up with [place], [place]. (Tuesday, 2/4 at [facility]. Please bring all your medications with you. You need INR check during this visit. )    Contact information:   [facility]   [address]   [place] [address]  [phone]        Discharge Orders    Future Orders Please Complete By Expires   Diet - low sodium heart healthy      Increase activity slowly         Consultations:  Malachy Moan, MD - IR  Procedures Performed:  Ct Abdomen Pelvis Wo Contrast  04/13/2012  *RADIOLOGY REPORT*  Clinical Data: As on recent ultrasound exam.  CT ABDOMEN AND PELVIS WITHOUT CONTRAST  Technique:  Multidetector CT imaging of the abdomen and pelvis was performed following the standard protocol without intravenous contrast.  Comparison: Ultrasound exam from yesterday.  Findings: Intravenous contrast was withheld, presumably secondary to the patient's renal insufficiency.  Images which include the lower  chest show asymmetric elevation of the right hemidiaphragm with some atelectasis at the right base.  As seen at ultrasound, the patient has a dominant, heterogeneous lesion in the central liver.  This is somewhat difficult to assess given the lack of intravenous contrast material, but it appears to arise from the caudate lobe and measures 14 x 10 x 12 cm. Other smaller lesions are scattered about the liver, including a 4.0 cm lesion in the inferior liver just anterior to the gallbladder and a lateral 1.9 cm lesion at the dome of the right liver.  Calcified granulomatous disease is seen in the spleen.  The stomach is decompressed.  The duodenum, pancreas, gallbladder, and adrenal glands are unremarkable.  Cortical scarring is noted in both kidneys without renal stone or hydronephrosis.  No definite renal mass is evident.   No abdominal aortic aneurysm.  No bulky retroperitoneal or intraperitoneal lymphadenopathy.  No evidence for bowel obstruction.  Imaging through the pelvis shows no free intraperitoneal fluid. There is no pelvic sidewall lymphadenopathy.  The prostate gland is enlarged.  No evidence for colonic mass.  The terminal ileum and the appendix are normal.  Bone windows reveal no worrisome lytic or sclerotic osseous lesions.  IMPRESSION: Dominant heterogeneous mass lesion in the central liver, apparently arising from the caudate lobe, measures up to 14 cm in diameter and generating mass effect in the porta hepatis and central abdomen. Other smaller hypoattenuating lesions are seen elsewhere in the liver parenchyma.  These changes may be related to primary liver neoplasm although no underlying features are evident on today's CT scan to suggest cirrhosis.  Metastatic disease would also be a consideration although no primary lesion is evident in the abdomen or pelvis.  The lack of intravenous contrast on today's study hinders assessment.   Original Report Authenticated By: Kennith Center, M.D.    Dg Chest 2 View  04/12/2012  *RADIOLOGY REPORT*  Clinical Data: Weakness, fever  CHEST - 2 VIEW  Comparison: 04/12/2012  Findings: Cardiomediastinal silhouette is unremarkable.  Elevation of the right hemidiaphragm.  Three leads cardiac pacemaker in place.  No pulmonary edema.  There is right basilar atelectasis or infiltrate.  IMPRESSION: Elevation of the right hemidiaphragm.  Right basilar atelectasis or infiltrate. Follow-up examination to assure resolution is recommended.   Original Report Authenticated By: Natasha Mead, M.D.    US Abdomen Complete  04/12/2012  *RADIOLOGY REPORT*  Clinical Data:  67 year old male with abdominal pain and distention, elevated LFTs and acute renal failure.  ABDOMINAL ULTRASOUND COMPLETE  Comparison:  None  Findings:  Gallbladder: Gallbladder sludge is noted.  There is no evidence of gallstones,  gallbladder wall thickening, or pericholecystic fluid.  Common Bile Duct:  There is no evidence of intrahepatic or extrahepatic biliary dilation. The CBD measures 2.1 mm in greatest diameter.  Liver: A 15.4 x 9.8 x 14.1 cm heterogeneous solid mass is identified within the abdomen with mass effect or origin within the porta hepatis/right liver.  There are several hyperechoic masses within the liver also identified which are slightly irregular.  IVC:  Appears normal.  Pancreas: The pancreas is not well visualized.  Spleen:  Within normal limits in size and echotexture.  Right kidney:  The right kidney is normal in size measuring 11.2 cm.  Upper limits of normal renal echogenicity noted.  Mild cortical atrophy is present.  There is no evidence of solid mass, hydronephrosis or definite renal calculi.  Left kidney:  The left kidney is normal in size measuring 12  cm. Upper limits of normal renal echogenicity noted.  There is no evidence of solid mass, hydronephrosis or definite renal calculi. Mild cortical atrophy is noted.  Abdominal Aorta:  No abdominal aortic aneurysm identified the abdominal aorta is difficult to fully visualize.  There is no evidence of ascites.  IMPRESSION: 15.4 x 9.8 x 14.1 cm mass situated in the region of the porta hepatis which involves originates from the liver. Hyperechoic hepatic lesions are suspicious for metastatic disease. Abdominal/pelvic CT with contrast is recommended for further evaluation.  Upper limits of normal renal echogenicity which may be related to medical renal disease.  No evidence of hydronephrosis.  Gallbladder sludge without evidence of cholelithiasis or cholecystitis.   Original Report Authenticated By: Harmon Pier, M.D.    US Biopsy  04/15/2012  *RADIOLOGY REPORT*  ULTRASOUND CORE BIOPSY  Date: 04/15/2012  Clinical History: 67 year old male with newly discovered hepatic mass and satellite hepatic nodules concerning for primary liver neoplasm versus intrahepatic  metastases from an unknown primary malignancy.  Ultrasound-guided core biopsy is requested to facilitate tissue diagnosis.  Procedures Performed: 1. Ultrasound-guided core biopsy of liver lesion  Interventional Radiologist:  Sterling Big, MD  Sedation: Moderate (conscious) sedation was used.  One mg Versed, 50 mcg Fentanyl were administered intravenously.  The patient's vital signs were monitored continuously by radiology nursing throughout the procedure.  Sedation Time: 10 minutes  Fluoroscopy time: None  Contrast volume: None  PROCEDURE/FINDINGS:   Informed consent was obtained from the patient following explanation of the procedure, risks, benefits and alternatives. The patient understands, agrees and consents for the procedure. All questions were addressed. A time out was performed.  Maximal barrier sterile technique utilized including caps, mask, sterile gowns, sterile gloves, large sterile drape, hand hygiene, and betadine skin prep.  The right upper quadrant was interrogated with ultrasound. Multiple lesions identified throughout the liver.  A suitable lesion in the right hepatic lobe was selected and an appropriate skin entry site marked.  Local anesthesia was achieved by infiltration of 1% lidocaine.  A small dermatotomy was made in the skin.  Under direct ultrasound guidance, a 17 gauge trocar needle was advanced through a short segment normal hepatic parenchyma and into the margin of one of the hepatic lesion.  Multiple 18 gauge core biopsies were then obtained using the Biopince automated biopsy device.  The biopsy tract was then embolized with a Gelfoam slurry as the 17 gauge trocar needle was removed.  Post biopsy ultrasound imaging demonstrated no immediate complication or active hemorrhage.  The patient tolerated the procedure very well.  IMPRESSION:  Technically successful ultrasound guided core biopsy of hepatic lesion  Signed,  Sterling Big, MD Vascular & Interventional Radiologist  [facility]   Original Report Authenticated By: Malachy Moan, M.D.   Admission HPI:  Mr. Kiper is a 67 year-old man with PMH of CHF ischemic CM, s/p ICD and pacemaker, and CVA who comes in to the [facility] ED for evaluation of 5 days of malaise with decreased appetite. He states that he was in his usual state of health until Wednesday when he started feeling weak. He has progressively worsened with subjective fever, nausea, dizziness, and diarrhea. In addition he reports having a "funny feeling" in the epigastric area and dyspnea on exertion for the past 3-5 days. The diarrhea lasted for a few days with 1-2 episodes per day but subsided yesterday but he continued to have poor intake per mouth. He states that he has not been walking around for  days now as he feels so tired. He reports being in contact with someone that had the flu. He did not receive the flu vaccine during this flu season. He was seen earlier today at the [facility] and was found to have BP of 78/62 and was sent to the [facility] ED via EMS after fluid resuscitation was started. His BP on his arrival to the ED was 104/79. He received additional IVF.  Of note, he is on spironolactone, metoprolol ER, and lisinopril-HCTZ at home and states that he has been taking all these medications the past days.  He denies cough, chest pain, dysuria, urinary retention, hematochezia, or melena.    Hospital Course by problem list: Shortness of breath. Improved. Likely multifactorial due to PNA and mass effect from liver mass with elevated right hemidiaphragm. His pro-BNP was only mildly elevate to ~700 with no sign of volume overload on physical exam or on CXR, making CHF exacerbation less likely. He was started on treatment for community acquired pneumonia. He was given one dose of Tamiflu but his Influenza PCR NS was negative. UDS negative. His shortness of breath improved during his hospitalization and he maintained O2 saturation in the 96-98% on RA.  He denies shortness of breath prior to his discharge.   Pneumonia, community acquired pneumonia. He had subjective fever for days prior to his presentation with decreased intake per mouth, his CXR showed right basilar atelectasis or infiltrate. Although his physical exam is benign, his symptoms at his presentation were concerning for CAP and he was treated empirically. Urine Strep pneumoniae and Legionella antigen negative. His blood cultures x2 were negative with no sputum sample given. He will be discharged with Levaquin 750mg  PO with two more days left of his 7 day course.   Intraabdominal mass. His abdominal CT shows heterogeneous mass in the central liver, arising from the caudate lobe, 14 cm in diameter and generating mass effect in the porta hepatis and central abdomen. Other smaller hypoattenuating lesions are seen elsewhere in the liver parenchyma. This is certainly concerning for liver malignancy (primary or secondary). He is s/p [place] core biopsy of liver mass by IR. He was initially treated with Flagyl IV but this was discontinued after his biopsy as this mass was noted to be very solid and less likely to be a liver abscess. Per GI PA, formal consult could be made after final pathology report from biopsy performed on 1/29. Pathology report was pending at that time of his discharge. He will follow up with his PCP, appointment made for early next week for discussion of pathology report and possible referral to Oncology.   Leukocytosis. WBC of 18.6 on admission. Likely 2/2 to CAP or liver mass per above. He was febrile on 1/27 with Tmax of 100.36F but has been afebrile since then. WBC to 14.9 today. UA with trace leucocytes, many bacteria, rare squamous cells, concerning for UTI, althought he denies urinary symptoms. Urine culture with NGTD. He was treated with Levaquin for PNA which has good coverage for most UTI pathogens. He continued to deny dysuria prior to his discharge.   Elevated LFTs. Stable.  Alk phos of 179, AST 75, and t bili at 1.4 on admission. He denies drinking alcohol or using illicit drugs. He has not history of traveling outside the [place]. He has had poor intake per mouth, SOB, distended abdomen, anorexia, diarrhea. His hepatitis panel is negative, HIV also negative. He has intraabdominal mass concerning for liver abscess v malignancy per above. He  had increased PT/INR despite coumadin being held but his LFTs have improved slightly.    Acute Renal Failure. Resolving. Baseline creatinine unknown but creatinine on admission at 2.54 with BUN 49, bicarb 22, and K of 5.0. Most likely prerenal azotemia in the setting of creased intake per mouth for 5 days. He denies urinary retention or dysuria. FeNa <1%. Cr trended down with IVF and further down to 1.09 today. His initial Mg was elevated at 2.6 (he reported recent use of Epson salts for constipation), phos normal at 3.0. He will need repeat labs for Cr trending at this hospital follow up visit.   Hypotension. Stable. BP of 107/80 prior to his discharge. On presentation, BP of 78/62 in Urgent Care, up to 106/83 with 1L NS bolus. He is on metoprolol ER, lisinopril-HCTZ, and spironolactone at home and assured compliance with these medications even while sick. We held all his antihypertensives during this hospitalization but will resume metoprolol upon his discharge.   DM2. Hgb A1C of 6.5%. He denies ever being diagnosed with DM2. Pending his biopsy report and prognosis he may not need treatment for this.    CHF/ischemic cardiomyopathy/ICD pacemaker. He does not appear to be volume overloaded on exam with no edema on CXR. No 2D echo on file, he has not seen a Cardiologist in years. He has PVCs on EKG but no active chest pain. He reports that his ICD pacemaker has not fired in years. He had no events on telemetry and had no chest pain during this hospitalization.    Anticoagulation therapy. He is on coumadin for CVA and A.fib with INR goal of 2-3,  his coumadin is managed by his PCP. His INR was 3.68 on admission but up to 5.85 despite holding warfarin. INR of >10 on 1/28,despite vitamin K 5mg  IV. He denied bleeding. Vitamin K 10mg  IV given once and repeat INR of 1.7 down to 1.51 prior to his liver biopsy. Warfarin restarted on 1/29,  post-biopsy. His INR was 1.71 prior to his discharge. He will be discharged with half dose of his home coumadin (2.5mg ) until 2/3 secondary to concomitant therapy with Levaquin. He will need INR check early next week, he has an appointment with his PCP for Tuesday.    Discharge Vitals:  BP 107/80  Pulse 101  Temp 98.6 F (37 C) (Oral)  Resp 18  Ht 5' 9.29" (1.76 m)  Wt 194 lb 8 oz (88.225 kg)  BMI 28.48 kg/m2  SpO2 96%  Discharge Labs:  Results for orders placed during the hospital encounter of 04/12/12 (from the past 24 hour(s))  PROTIME-INR     Status: Abnormal   Collection Time   04/16/12  5:00 AM      Component Value Range   Prothrombin Time 19.5 (*) 11.6 - 15.2 seconds   INR 1.71 (*) 0.00 - 1.49  COMPREHENSIVE METABOLIC PANEL     Status: Abnormal   Collection Time   04/16/12 10:16 AM      Component Value Range   Sodium 136  135 - 145 mEq/L   Potassium 4.1  3.5 - 5.1 mEq/L   Chloride 101  96 - 112 mEq/L   CO2 25  19 - 32 mEq/L   Glucose, Bld 112 (*) 70 - 99 mg/dL   BUN 21  6 - 23 mg/dL   Creatinine, Ser 9.60  0.50 - 1.35 mg/dL   Calcium 9.3  8.4 - 45.4 mg/dL   Total Protein 7.1  6.0 - 8.3 g/dL  Albumin 2.7 (*) 3.5 - 5.2 g/dL   AST 40 (*) 0 - 37 U/L   ALT 35  0 - 53 U/L   Alkaline Phosphatase 203 (*) 39 - 117 U/L   Total Bilirubin 0.8  0.3 - 1.2 mg/dL   GFR calc non Af Amer 70 (*) >90 mL/min   GFR calc Af Amer 81 (*) >90 mL/min  CBC     Status: Abnormal   Collection Time   04/16/12 10:16 AM      Component Value Range   WBC 14.9 (*) 4.0 - 10.5 K/uL   RBC 3.87 (*) 4.22 - 5.81 MIL/uL   Hemoglobin 12.2 (*) 13.0 - 17.0 g/dL   HCT 16.1 (*) 09.6 - 04.5 %   MCV 89.9  78.0 - 100.0 fL    MCH 31.5  26.0 - 34.0 pg   MCHC 35.1  30.0 - 36.0 g/dL   RDW 40.9  81.1 - 91.4 %   Platelets 261  150 - 400 K/uL    Signed: Sara Chu D 04/16/2012, 11:02 PM   Time Spent on Discharge: 40 minutes Services Ordered on Discharge: HH PT Equipment Ordered on Discharge: Agricultural consultant

## 2012-04-16 NOTE — Care Management Note (Signed)
   CARE MANAGEMENT NOTE 04/16/2012  Patient:  Jonathan Reed, Jonathan Reed   Account Number:  [id number]  Date Initiated:  04/16/2012  Documentation initiated by:  Shekita Boyden  Subjective/Objective Assessment:   Order for HHPT and recommendation for rolling walker.     Action/Plan:   CM will follow for d/c needs and ask [facility] to eval pt in the home for safety.   Anticipated DC Date:  04/19/2012   Anticipated DC Plan:  HOME W HOME HEALTH SERVICES         Choice offered to / List presented to:  C-1 Patient   DME arranged  Levan Hurst      DME agency  [facility]     [facility] arranged  HH-1 RN      [place] agency  [facility]   Status of service:  Completed, signed off Medicare Important Message given?   (If response is "NO", the following Medicare IM given date fields will be blank) Date Medicare IM given:   Date Additional Medicare IM given:    Discharge Disposition:  HOME W HOME HEALTH SERVICES  Per UR Regulation:    If discussed at Long Length of Stay Meetings, dates discussed:    Comments:  04/16/2012 Noted order for HHPT, pt however will qualify for [facility] visit for home safety. This CM has asked [facility] to provide this service as this pt is without insurance at this time. Rolling walker ordered per recommendation by PT evaluation. Call placed to finance re medicaid application if pt is to be inpatient for a few more days an appointment may be scheduled. Will speak with finance and inform pt. Johny Shock RN MPH Case Manager [phone]

## 2012-04-16 NOTE — Progress Notes (Signed)
Physical Therapy Treatment Patient Details Name: Jonathan Reed MRN: [id number] DOB: 1945-06-21 Today's Date: 04/16/2012 Time: 4098-1191 PT Time Calculation (min): 23 min  PT Assessment / Plan / Recommendation Comments on Treatment Session  Patient tolerated ambulation with min c/o fatigue and needing rest break before participting in exercises.  Still not steady with cane.  Educated him and daughter will need RW for safety initially for ambulation at home.    Follow Up Recommendations  Home health PT;Supervision for mobility/OOB           Equipment Recommendations  Rolling walker with 5" wheels       Frequency Min 3X/week   Plan Discharge plan remains appropriate    Precautions / Restrictions Precautions Precautions: Fall   Pertinent Vitals/Pain 7/10 in abdomen    Mobility  Bed Mobility Supine to Sit: 6: Modified independent (Device/Increase time) Sitting - Scoot to Edge of Bed: 6: Modified independent (Device/Increase time) Transfers Sit to Stand: 5: Supervision;From bed Stand to Sit: 6: Modified independent (Device/Increase time);To bed Ambulation/Gait Ambulation/Gait Assistance: 4: Min assist Ambulation Distance (Feet): 220 Feet Assistive device: Straight cane (and occasional use of wall rail) Ambulation/Gait Assistance Details: demonstrated loss of balance when coming out of room with cane, min assist to recover; cues for safety with cane due to kicking cane x 1 Gait Pattern: Step-through pattern    Exercises General Exercises - Lower Extremity Hip ABduction/ADduction: AROM;Both;10 reps;Standing (min UE support) Hip Flexion/Marching: AROM;Both;10 reps;Standing (min UE support) Heel Raises: AROM;Both;10 reps;Standing (min UE support) Other Exercises Other Exercises: sit<>stand x 5 reps with UE support    PT Goals Acute Rehab PT Goals Pt will go Sit to Stand: with modified independence PT Goal: Sit to Stand - Progress: Progressing toward goal Pt will go Stand  to Sit: with modified independence PT Goal: Stand to Sit - Progress: Progressing toward goal Pt will Ambulate: >150 feet;with modified independence PT Goal: Ambulate - Progress: Progressing toward goal  Visit Information  Last PT Received On: 04/16/12    Subjective Data  Subjective: Can't eat when my stomach hurts.   Cognition  Overall Cognitive Status: Appears within functional limits for tasks assessed/performed Arousal/Alertness: Awake/alert Orientation Level: Appears intact for tasks assessed       End of Session PT - End of Session Equipment Utilized During Treatment: Gait belt Activity Tolerance: Patient tolerated treatment well Patient left: in bed;with family/visitor present;with call bell/phone within reach   GP     [facility] 04/16/2012, 2:58 PM [place], [phone] 04/16/2012

## 2012-04-16 NOTE — Progress Notes (Signed)
Subjective: He continues to have abdominal discomfort but is able to tolerate foods well. He denies N/V or diarrhea. He had Miralax yesterday but still with no BM he would like to try Dulcolax suppository today.   Objective: Vital signs in last 24 hours: Filed Vitals:   04/15/12 1755 04/15/12 2205 04/16/12 0600 04/16/12 0903  BP: 111/75 125/81 131/87 128/67  Pulse: 57 87 83 83  Temp: 98.7 F (37.1 C) 99.1 F (37.3 C) 99.1 F (37.3 C) 98.8 F (37.1 C)  TempSrc: Oral Oral Oral Oral  Resp: 18 18 18 18   Height:      Weight:  194 lb 8 oz (88.225 kg)    SpO2: 96% 99% 97% 98%   Weight change:   Intake/Output Summary (Last 24 hours) at 04/16/12 1138 Last data filed at 04/16/12 0649  Gross per 24 hour  Intake    680 ml  Output    250 ml  Net    430 ml  Vitals reviewed.  General: resting in bed, in NAD, his wife at his bedside  HEENT: Wearing bifocals, PERRL, no scleral icterus  Cardiac: irregular rhythm with dropped beats, no rubs, murmurs or gallops  Chest: ICD implant noted on left chest  Pulm: clear to auscultation bilaterally, no wheezes, rales, or rhonchi  Abd: obese, soft, TTP in the epigastric area, mildly distended, no guarding or rebound tenderness, normal BS present. Band-aid at the RUQ/flank at site of biopsy that is d/c/i with no bleeding noted.  Ext: warm and well perfused, no pedal edema  Neuro: alert and oriented X3, cranial nerves II-XII grossly intact, strength and sensation to light touch equal in bilateral upper and lower extremities   Lab Results: Basic Metabolic Panel:  Lab 04/16/12 4098 04/15/12 0700 04/12/12 1717  NA 136 137 --  K 4.1 4.3 --  CL 101 100 --  CO2 25 25 --  GLUCOSE 112* 101* --  BUN 21 26* --  CREATININE 1.09 1.26 --  CALCIUM 9.3 9.3 --  MG -- -- 2.6*  PHOS -- -- 3.0   Liver Function Tests:  Lab 04/16/12 1016 04/14/12 0823  AST 40* 49*  ALT 35 45  ALKPHOS 203* 190*  BILITOT 0.8 2.1*  PROT 7.1 7.1  ALBUMIN 2.7* 2.7*    Lab  04/12/12 1254  LIPASE 17  AMYLASE --   CBC:  Lab 04/16/12 1016 04/15/12 0700 04/12/12 1254  WBC 14.9* 13.4* --  NEUTROABS -- -- 14.0*  HGB 12.2* 11.4* --  HCT 34.8* 33.5* --  MCV 89.9 90.5 --  PLT 261 209 --   BNP:  Lab 04/12/12 1505  PROBNP 771.7*   Hemoglobin A1C:  Lab 04/12/12 1717  HGBA1C 6.5*   Coagulation:  Lab 04/16/12 0500 04/15/12 0700 04/14/12 0828 04/14/12 0635  LABPROT 19.5* 17.8* 19.5* >90.0*  INR 1.71* 1.51* 1.71* >10.00*   Urine Drug Screen: Drugs of Abuse     Component Value Date/Time   LABOPIA NONE DETECTED 04/12/2012 2049   COCAINSCRNUR NONE DETECTED 04/12/2012 2049   LABBENZ NONE DETECTED 04/12/2012 2049   AMPHETMU NONE DETECTED 04/12/2012 2049   THCU NONE DETECTED 04/12/2012 2049   LABBARB NONE DETECTED 04/12/2012 2049    Alcohol Level:  Lab 04/12/12 1505  ETH <11   Urinalysis:  Lab 04/12/12 2049  COLORURINE AMBER*  LABSPEC 1.025  PHURINE 5.0  GLUCOSEU NEGATIVE  HGBUR SMALL*  BILIRUBINUR NEGATIVE  KETONESUR NEGATIVE  PROTEINUR 30*  UROBILINOGEN 1.0  NITRITE NEGATIVE  LEUKOCYTESUR TRACE*  Micro Results: Recent Results (from the past 240 hour(s))  CULTURE, BLOOD (ROUTINE X 2)     Status: Normal (Preliminary result)   Collection Time   04/12/12  5:25 PM      Component Value Range Status Comment   Specimen Description BLOOD LEFT HAND   Final    Special Requests BOTTLES DRAWN AEROBIC AND ANAEROBIC 10CC   Final    Culture  Setup Time 04/13/2012 01:04   Final    Culture     Final    Value:        BLOOD CULTURE RECEIVED NO GROWTH TO DATE CULTURE WILL BE HELD FOR 5 DAYS BEFORE ISSUING A FINAL NEGATIVE REPORT   Report Status PENDING   Incomplete   CULTURE, BLOOD (ROUTINE X 2)     Status: Normal (Preliminary result)   Collection Time   04/12/12  5:35 PM      Component Value Range Status Comment   Specimen Description BLOOD LEFT HAND   Final    Special Requests BOTTLES DRAWN AEROBIC AND ANAEROBIC 10CC   Final    Culture  Setup Time  04/13/2012 01:04   Final    Culture     Final    Value:        BLOOD CULTURE RECEIVED NO GROWTH TO DATE CULTURE WILL BE HELD FOR 5 DAYS BEFORE ISSUING A FINAL NEGATIVE REPORT   Report Status PENDING   Incomplete   URINE CULTURE     Status: Normal   Collection Time   04/13/12 12:30 PM      Component Value Range Status Comment   Specimen Description URINE, RANDOM   Final    Special Requests NONE   Final    Culture  Setup Time 04/13/2012 12:59   Final    Colony Count NO GROWTH   Final    Culture NO GROWTH   Final    Report Status 04/14/2012 FINAL   Final    Studies/Results: US Biopsy  04/15/2012  *RADIOLOGY REPORT*  ULTRASOUND CORE BIOPSY  Date: 04/15/2012  Clinical History: 67 year old male with newly discovered hepatic mass and satellite hepatic nodules concerning for primary liver neoplasm versus intrahepatic metastases from an unknown primary malignancy.  Ultrasound-guided core biopsy is requested to facilitate tissue diagnosis.  Procedures Performed: 1. Ultrasound-guided core biopsy of liver lesion  Interventional Radiologist:  Sterling Big, MD  Sedation: Moderate (conscious) sedation was used.  One mg Versed, 50 mcg Fentanyl were administered intravenously.  The patient's vital signs were monitored continuously by radiology nursing throughout the procedure.  Sedation Time: 10 minutes  Fluoroscopy time: None  Contrast volume: None  PROCEDURE/FINDINGS:   Informed consent was obtained from the patient following explanation of the procedure, risks, benefits and alternatives. The patient understands, agrees and consents for the procedure. All questions were addressed. A time out was performed.  Maximal barrier sterile technique utilized including caps, mask, sterile gowns, sterile gloves, large sterile drape, hand hygiene, and betadine skin prep.  The right upper quadrant was interrogated with ultrasound. Multiple lesions identified throughout the liver.  A suitable lesion in the right hepatic  lobe was selected and an appropriate skin entry site marked.  Local anesthesia was achieved by infiltration of 1% lidocaine.  A small dermatotomy was made in the skin.  Under direct ultrasound guidance, a 17 gauge trocar needle was advanced through a short segment normal hepatic parenchyma and into the margin of one of the hepatic lesion.  Multiple 18 gauge core biopsies  were then obtained using the Biopince automated biopsy device.  The biopsy tract was then embolized with a Gelfoam slurry as the 17 gauge trocar needle was removed.  Post biopsy ultrasound imaging demonstrated no immediate complication or active hemorrhage.  The patient tolerated the procedure very well.  IMPRESSION:  Technically successful ultrasound guided core biopsy of hepatic lesion  Signed,  Sterling Big, MD Vascular & Interventional Radiologist [facility]   Original Report Authenticated By: Malachy Moan, M.D.    Medications: I have reviewed the patient's current medications. Scheduled Meds:   . antiseptic oral rinse  15 mL Mouth Rinse BID  . aspirin EC  81 mg Oral Daily  . docusate sodium  100 mg Oral Daily  . levofloxacin  750 mg Oral Q1200  . metroNIDAZOLE  500 mg Intravenous Q8H  . polyethylene glycol  17 g Oral Daily  . sodium chloride  3 mL Intravenous Q12H  . Warfarin - Pharmacist Dosing Inpatient   Does not apply q1800   Continuous Infusions:  PRN Meds:.acetaminophen, acetaminophen, bisacodyl, HYDROcodone-acetaminophen, ondansetron (ZOFRAN) IV, ondansetron, oxyCODONE-acetaminophen Assessment/Plan: Shortness of breath. Improved. Likely multifactorial due to PNA and mass effect from liver mass with elevated right hemidiaphragm. His pro-BNP is only mildly elevate to ~700 with no sign of volume overload on physical exam or on CXR, making CHF exacerbation less likely. Influenza PCR NS negative. UDS negative.  -Continue telemetry  -PNA treatment per below  -Discontinued Tamiflu (renally dosed)   -Zofran PRN   Pneumonia, community acquired pneumonia. He had subjective fever for days prior to his presentation with decreased intake per mouth, his CXR shows right basilar atelectasis or infiltrate. Although his physical exam is benign, his symptoms at his presentation were concerning for CAP and he will continued being treated empirically. Urine Strep pneumoniae and Legionella antigen negative.  -Transitioned Levaquin to PO, renally dose (day 5/10) -blood cultures x2 NGTD  -f/u sputum culture   Intraabdominal mass. His abdominal CT shows heterogeneous mass in the central liver, arising from the caudate lobe, 14 cm in diameter and generating mass effect in the porta hepatis and central abdomen. Other smaller hypoattenuating lesions are seen elsewhere in the liver parenchyma. This is certainly concerning for liver malignancy (primary or secondary) but biopsy for pathology analysis indicated. He is s/p [place] core biopsy of liver mass by IR.  -Per GI PA, formal consult will be made after pathology report from biopsy performed on 1/29. Path reports pending.   -Discontinued flagyl--mass is solid and unlikely to be an abscess -Pathology pending, ordered fungal smear and culture, gram stain and culture, pathology/cytology   Leukocytosis. WBC of 18.6 on admission. Likely 2/2 to CAP or liver abscess per above. He was febrile on 1/27 with Tmax of 100.50F but has afebrile since then. WBC to 14.9 today. UA with trace leucocytes, many bacteria, rare squamous cells, concerning for UTI, althought he denies urinary symptoms. Urine culture with NGTD.  -Continue Levaquin per above  -f/u Urine culture  -CBC daily   Elevated LFTs. Stable. Alk phos of 179, AST 75, and t bili at 1.4 on admission. He denies drinking alcohol or using illicit drugs. He has not history of traveling outside the [place]. He has had poor intake per mouth, SOB, distended abdomen, anorexia, diarrhea. His hepatitis panel is negative, HIV also  negative. He has intraabdominal mass concerning for liver abscess v malignancy per above. Her had increased PT/INR today despite holding coumadin but his LFTs have improved slightly.  -Liver biopsy path  pending  Acute Renal Failure. Resolving. Baseline creatinine unknown but creatinine on admission at 2.54 with BUN 49, bicarb 22, and K of 5.0. Most likely prerenal azotemia in the setting of creased intake per mouth for 5 days. He denies urinary retention of dysuria. FeNa <1%. Cr trended further down to 1.09 today. His in initial Mg was elevated at 2.6 (he reported recent use of Epson salts for constipation), phos normal at 3.0.  -Strict I&O  -CBM in AM   Hypotension. Stable. BP of 128/67 today. On presentation, 78/62 in Urgent Care, up to 106/83 with 1L NS bolus. He is on metoprolol ER, lisinopril-HCTZ, and spironolactone at home and assures compliance with these medications even while sick.  -Hold all antihypertensives at this time   DM2. Hgb A1C of 6.5%. He denies ever being diagnosed with DM2.  -CBG checks  -SSI sensitive   CHF/ischemic cardiomyopathy/ICD pacemaker. He does not appear volume overloaded on exam with no edema on CXR. No 2D echo on file, he has not seen a Cardiologist in years. He has PVCs on EKG but no active chest pain. He reports that his ICD pacemaker has not fired in years.  -Telemetry  -Attempt to obtain records from PCP  -Will consider 2D echo    Anticoagulation therapy. He is on coumadin for CVA and A.fib with INR goal of 2-3, his coumadin is managed by his PCP. His INR is 3.68 on admission but up to 5.85 despite holding warfarin. INR of >10 on 1/28,despite vitamin K 5mg  IV yesterday. Pt denies bleeding. Vitamin K 10mg  IV given on repeat INR of 1.7 down to 1.51 prior to biopsy. Warfarin restarted on 1/29 post-biopsy.  -PT/INR in AM  -warfarin per pharmacy   DVT prophylaxis. On coumadin   FEN: NSL  Replete as needed  Regular diet  Dispo: Disposition is deferred  at this time, awaiting improvement of current medical problems. Anticipated discharge in approximately 2-3 day(s).  The patient does have a current PCP Karle Plumber, MD), therefore will not be requiring OPC follow-up after discharge.  The patient does not have transportation limitations that hinder transportation to clinic appointments.  .Services Needed at time of discharge: Y = Yes, Blank = No  PT:    OT:    RN:    Equipment:    Other:        LOS: 4 days   Sara Chu D 04/16/2012, 11:38 AM

## 2012-04-19 LAB — CULTURE, BLOOD (ROUTINE X 2): Culture: NO GROWTH

## 2012-04-20 ENCOUNTER — Telehealth: Payer: Self-pay | Admitting: Internal Medicine

## 2012-04-20 NOTE — Discharge Summary (Signed)
INTERNAL MEDICINE TEACHING SERVICE Attending Note  Date: 04/20/2012  Patient name: Jonathan Reed  Medical record number: [id number]  Date of birth: 28-Oct-1945    This patient has been seen and discussed with the house staff. Please see their note for complete details. I concur with their findings and plan. Follow up needed in clinic as well as consultation in outpatient setting with oncology due to findings on pathology from liver mass.   [name], DO  04/20/2012, 8:51 AM

## 2012-04-20 NOTE — Telephone Encounter (Signed)
Called [name] to inform him that we will need to see him in clinic this week to discuss findings on liver biopsy. He is agreeable. I called Dr. Flossie Buffy (his PCP) office to discuss the case with him but he is not here this week per the office operator.   He will need to be sent to oncology after discussing the findings with him in person (metastatic carcinoid tumor on liver biopsy).  Jonathan Reed

## 2012-04-21 ENCOUNTER — Encounter: Payer: Self-pay | Admitting: Internal Medicine

## 2012-04-21 ENCOUNTER — Encounter: Payer: Self-pay | Admitting: Licensed Clinical Social Worker

## 2012-04-21 ENCOUNTER — Telehealth: Payer: Self-pay | Admitting: Oncology

## 2012-04-21 ENCOUNTER — Ambulatory Visit (INDEPENDENT_AMBULATORY_CARE_PROVIDER_SITE_OTHER): Payer: Self-pay | Admitting: Internal Medicine

## 2012-04-21 VITALS — BP 104/83 | HR 84 | Temp 98.0°F | Ht 71.0 in | Wt 194.3 lb

## 2012-04-21 DIAGNOSIS — I10 Essential (primary) hypertension: Secondary | ICD-10-CM

## 2012-04-21 DIAGNOSIS — R0602 Shortness of breath: Secondary | ICD-10-CM

## 2012-04-21 DIAGNOSIS — C759 Malignant neoplasm of endocrine gland, unspecified: Secondary | ICD-10-CM

## 2012-04-21 DIAGNOSIS — C787 Secondary malignant neoplasm of liver and intrahepatic bile duct: Secondary | ICD-10-CM

## 2012-04-21 DIAGNOSIS — D3A00 Benign carcinoid tumor of unspecified site: Secondary | ICD-10-CM

## 2012-04-21 MED ORDER — OXYCODONE HCL 5 MG PO TABS: 5.0000 mg | ORAL_TABLET | Freq: Four times a day (QID) | ORAL | Status: DC | PRN

## 2012-04-21 NOTE — Telephone Encounter (Signed)
S/W pt in re NP appt on 02/05 @ 9 w/Dr. Gaylyn Rong.  Referring Dr. Garald Braver Dx- Carcinoid Tumor Welcome packet at registration.

## 2012-04-21 NOTE — Patient Instructions (Addendum)
-  They will call you from the [facility] with your appointment -Take your pain medication only as needed.  -Continue taking your coumadin, follow up with [place] Snow, the physician's assistant for Dr. Kathrynn Speed today at 2PM--they will check your INR and liver function  -Your blood pressure is well-controlled today, you do not need to take your other blood pressure medications but continue taking your metoprolol.

## 2012-04-21 NOTE — Progress Notes (Signed)
  Subjective:    Patient ID: Jonathan Reed, male    DOB: 04-Jun-1945, 67 y.o.   MRN: [id number]  HPI Jonathan Reed is a pleasant 67 year old man with PMH of Ischemic cardiomyopathy s/p ICD pacemaker implant, history of CVA on coumadin, with recently hospitalization for SOB and discharge on 04/16/12 who comes in for a hospital follow up visit. During his hospitalization he underwent US guided biopsy of a 14 x 10 x 12 cm liver lesion with pathology report revealing carcinoid tumor. The patient, his wife, and two daughters were present during this visit and were informed about the pathology report. The patient has an appointment at this PCP office's this afternoon with Dr. Flossie Buffy PA, [place] Snow who has been notified via telephone of the pathology report as well.  The patient and his family will continue following with Dr. Kathrynn Speed at this time but will await Oncology follow up to decide on possible PCP change to the [facility].  Jonathan Reed states that he has had minimum SOB but denies flushing, diarrhea, constipation, or wheezing. He continues to tolerate intake per mouth well. He restarted his regular dose of coumadin today; he denies overt bleeding.   Review of Systems  Constitutional: Positive for fatigue. Negative for fever, chills, diaphoresis, activity change, appetite change and unexpected weight change.  Respiratory: Positive for shortness of breath. Negative for cough, chest tightness and wheezing.   Cardiovascular: Negative for palpitations.  Gastrointestinal: Positive for abdominal pain and abdominal distention. Negative for nausea, vomiting, diarrhea, constipation and blood in stool.  Genitourinary: Negative for dysuria.  Musculoskeletal: Negative for back pain.  Neurological: Negative for dizziness and headaches.  Psychiatric/Behavioral: Negative for behavioral problems and agitation.       Objective:   Physical Exam  Nursing note and vitals reviewed. Constitutional: He is oriented to person, place,  and time. He appears well-developed and well-nourished. No distress.  HENT:  Head: Normocephalic.  Eyes: Conjunctivae normal are normal.  Neck: Neck supple.  Cardiovascular: Normal rate.   No murmur heard.      Irregular rhythm with occasional PVCs  Pulmonary/Chest: Breath sounds normal. No respiratory distress. He has no wheezes. He exhibits no tenderness.  Abdominal: Soft. Bowel sounds are normal. He exhibits no distension and no mass. There is tenderness. There is no rebound and no guarding.       RUQ tenderness  Musculoskeletal: He exhibits no edema and no tenderness.  Neurological: He is alert and oriented to person, place, and time.  Skin: Skin is warm and dry. He is not diaphoretic.  Psychiatric: He has a normal mood and affect. His behavior is normal.          Assessment & Plan:

## 2012-04-21 NOTE — Assessment & Plan Note (Signed)
He complains of occasional shortness of breath since leaving the hospital. He was treated for pneumonia with CXR showing right basilar atelectasis or infiltrate. This could be malignancy as well. Of note he denies wheezing, flushing, or diarrhea.  -Oncology referral

## 2012-04-21 NOTE — Assessment & Plan Note (Addendum)
The patient, his wife, and two daughters were informed today of the pathology findings. They have been seen by Dr. Kathrynn Speed in the past but will await Oncology appointment before deciding if they need to change PCP to the [facility] (Dr. Flossie Buffy office is in [facility] which is a little too distant for them). The family met with Lynnae January, CSW, for initial information in regards to applying for disability--the pt was working in [employer]scape [employer] prior to his admission and is uninsured at this time. He was informed that his type of cancer is rare but we do not know the primary source of his tumor as of yet and he may need further studies including endoscopy and colonoscopy for appropriate staging prior to a concise treatment plan. The patient and his family understood, they were given the [facility] number in case he has acute issues prior to his Oncology appointment.   -Oncology referral, the pt will be contacted with appointment--appointment with Dr. Gaylyn Rong on 2/5 at [facility] -Encourage the pt to follow up with with [place] Snow today for [facility] and repeat INR check -Referral to the [facility] CSW -Oxycodone 5mg  q8hr PRN as needed for pain #30--he is encouraged to stop taking Norco to avoid acetominophen intake and to follow up with his Oncologist for ongoing pain management

## 2012-04-21 NOTE — Telephone Encounter (Signed)
C/D 04/21/12 for appt. 04/22/12

## 2012-04-21 NOTE — Assessment & Plan Note (Signed)
His BP today is normal low at 104/83. He was admitted to the hospital for hypotension. His anti-hypertensive are discontinued. He will remain taking metoprolol.

## 2012-04-21 NOTE — Progress Notes (Signed)
Jonathan Reed presents to the [facility] as hospital f/u to obtain biopsy results.  Pt has PCP outside of [facility], but daughter's state pt may transfer to [facility].  Pt is attended by spouse and two daughters.  Pt informed of dx: metastatic carcinoid tumor on liver biopsy.  Pt will be referred to [facility].  Currently, Jonathan Reed is uninsured.  Pt/family aware of need to initiate [employer] orange card application.  CSW informed pt and daughter's to initiate the disability application, provided pt and family with instructions.  Daughter's inquire about FMLA paperwork as they have been assisting pt during hospitalization and upon d/c.  Jonathan Reed signed ROI for each daughter's employer.  CSW instructed daughters to have employers fax FMLA forms to CSW attention.  CSW informed daughters to have ROI signed at [facility] office as well.  CSW can complete FMLA forms from pt's stay at [facility].

## 2012-04-22 ENCOUNTER — Telehealth: Payer: Self-pay | Admitting: Oncology

## 2012-04-22 ENCOUNTER — Ambulatory Visit (HOSPITAL_BASED_OUTPATIENT_CLINIC_OR_DEPARTMENT_OTHER): Payer: Self-pay | Admitting: Oncology

## 2012-04-22 ENCOUNTER — Ambulatory Visit: Payer: Self-pay

## 2012-04-22 ENCOUNTER — Other Ambulatory Visit: Payer: Self-pay | Admitting: Lab

## 2012-04-22 ENCOUNTER — Encounter: Payer: Self-pay | Admitting: Oncology

## 2012-04-22 ENCOUNTER — Encounter: Payer: Self-pay | Admitting: *Deleted

## 2012-04-22 VITALS — BP 128/89 | HR 63 | Temp 97.2°F | Resp 20 | Ht 71.0 in | Wt 195.2 lb

## 2012-04-22 DIAGNOSIS — Z950 Presence of cardiac pacemaker: Secondary | ICD-10-CM

## 2012-04-22 DIAGNOSIS — I2510 Atherosclerotic heart disease of native coronary artery without angina pectoris: Secondary | ICD-10-CM | POA: Insufficient documentation

## 2012-04-22 DIAGNOSIS — I10 Essential (primary) hypertension: Secondary | ICD-10-CM | POA: Insufficient documentation

## 2012-04-22 DIAGNOSIS — Z8673 Personal history of transient ischemic attack (TIA), and cerebral infarction without residual deficits: Secondary | ICD-10-CM

## 2012-04-22 DIAGNOSIS — C7B8 Other secondary neuroendocrine tumors: Secondary | ICD-10-CM

## 2012-04-22 DIAGNOSIS — I509 Heart failure, unspecified: Secondary | ICD-10-CM | POA: Insufficient documentation

## 2012-04-22 DIAGNOSIS — I639 Cerebral infarction, unspecified: Secondary | ICD-10-CM | POA: Insufficient documentation

## 2012-04-22 DIAGNOSIS — C7A00 Malignant carcinoid tumor of unspecified site: Secondary | ICD-10-CM

## 2012-04-22 DIAGNOSIS — C787 Secondary malignant neoplasm of liver and intrahepatic bile duct: Secondary | ICD-10-CM

## 2012-04-22 MED ORDER — PROCHLORPERAZINE MALEATE 10 MG PO TABS: 10.0000 mg | ORAL_TABLET | Freq: Four times a day (QID) | ORAL | Status: DC | PRN

## 2012-04-22 MED ORDER — CAPECITABINE 500 MG PO TABS: 2000.0000 mg | ORAL_TABLET | Freq: Two times a day (BID) | ORAL | Status: DC

## 2012-04-22 MED ORDER — SULFAMETHOXAZOLE-TRIMETHOPRIM 800-160 MG PO TABS: 1.0000 | ORAL_TABLET | ORAL | Status: DC

## 2012-04-22 MED ORDER — TEMOZOLOMIDE 100 MG PO CAPS: 200.0000 mg/m2/d | ORAL_CAPSULE | Freq: Every day | ORAL | Status: DC

## 2012-04-22 NOTE — Progress Notes (Signed)
Rx for Xeloda and Temodar given to Jonathan Reed in [facility] Dept.  Instructed pt/family to wait in Scheduling to speak w/ Ebony about paperwork for financial assistance.

## 2012-04-22 NOTE — Patient Instructions (Addendum)
1.  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  2.  Primary source:  Normally GI track. 3.  Work up:  *  Octreotide-scan to see if any are light up to suggest primary source  *  GI referral for potential endoscopy and/or colonoscopy depending on octreotide scan.  *  CT chest to rule out lung mets.   4.  Treatment:  *  Local therapy (such as surgery or radiation/or cryoablation) is appropriate at this time, in my opinion due to large size of tumor.  I will present your case at tumor board for formal consensus.   *  Chemotherapy: combination chemo pills:  Xeloda + Temodar.  Chance of response about 40-50%.  Potential side effects include but not limited to hair loss, fatigue, mouth sore, low blood count, infection, bleeding, diarrhea, chest pain.   If there is a good response with chemotherapy and the cancer shrinks, then surgery maybe an option (if there is no widespread disease).

## 2012-04-22 NOTE — Progress Notes (Signed)
midepigastric pain x 1 week PTA.  Mild/mod 5/10; radaite to right (under ribs). Intermittent.  No relation with foods/BM.  Needs Percocet BID.  Nausea/no vomiting (now resolved).   Decreased appetite; now better with pain med. Weight stable.  Strength OK.    NO Flush, no SOB, no diarrhea.  Was jauncie in arm and dark urine.  Resolved.      [facility]  Telephone:(336) [phone] Fax:(336) [phone]   MEDICAL ONCOLOGY - INITIAL CONSULATION    Referral MD:  Dr. Idelia Salm. Arvind. M.D.  Reason for Referral: newly diagnosed well-differentiated neuroendocrine tumor.    HPI:  Mr. Jonathan Reed is a 67 year-old man with HTN, CAD, CHF, CVA with now euvolumic and no residual stroke symptoms.  He presented to local ED with midepigastric pain x 1 week PTA.  Mild/mod 5/10; radaite to right (under ribs). Intermittent.  No relation with foods/BM.  Needs OxyCodone BID.  Associated with nausea/no vomiting (now resolved).  He had decreased appetite; now better with pain med. Weight stable. His strength was OK.    He reported no Flush, no SOB, no diarrhea.  Was jauncie in arm and dark urine which has resolved. CT abdomen showed liver mass with biopsy on 04/15/2012 with pathology case #[id number] consistent with well-differentiated neuroendocrine tumor.  He was kindly referred to the [facility] for evaluation.  Mr. [name] presented to the clinic for the first time today with his wife and 2 daughters.  He has mild mid epigastric pain and early satiety.  His weight is still stable.  His pain is mild to moderate now and is well controlled with OxyCodone about twice daily.  He denies fever, anorexia, weight loss, fatigue, headache, visual changes, confusion, drenching night sweats, palpable lymph node swelling, mucositis, odynophagia, dysphagia, nausea vomiting, jaundice, chest pain, palpitation, shortness of breath, dyspnea on exertion, productive cough, gum bleeding, epistaxis, hematemesis,  hemoptysis, abdominal swelling, melena, hematochezia, hematuria, skin rash, spontaneous bleeding, joint swelling, joint pain, heat or cold intolerance, bowel bladder incontinence, back pain, focal motor weakness, paresthesia, depression.      Past Medical History  Diagnosis Date  . CHF (congestive heart failure)     with ICD (f/u with [facility])  . CVA (cerebral infarction) 2011    No residual Sx  . CAD (coronary artery disease) 2000  . HTN (hypertension)   :  Past Surgical History  Procedure Date  . Permanent pacemaker insertion   . Hernia repair   . Sinus surgery with instatrak   . Colonoscopy 1980    colon polyps, no f/u since.   :  Current Outpatient Prescriptions  Medication Sig Dispense Refill  . metoprolol succinate (TOPROL-XL) 25 MG 24 hr tablet Take 25 mg by mouth daily.      Marland Kitchen oxyCODONE (ROXICODONE) 5 MG immediate release tablet Take 1 tablet (5 mg total) by mouth every 6 (six) hours as needed for pain.  30 tablet  0  . warfarin (COUMADIN) 5 MG tablet Take 2.5 mg (half a tablet) daily until 2/3.  Take 5mg  daily starting 2/4.  30 tablet  0  . capecitabine (XELODA) 500 MG tablet Take 4 tablets (2,000 mg total) by mouth 2 (two) times daily after a meal.  112 tablet  0  . levofloxacin (LEVAQUIN) 750 MG tablet Take 1 tablet (750 mg total) by mouth daily at 12 noon.  2 tablet  0  . ondansetron (ZOFRAN) 4 MG tablet Take 1 tablet (4 mg total) by mouth every  6 (six) hours as needed for nausea.  20 tablet  0  . prochlorperazine (COMPAZINE) 10 MG tablet Take 1 tablet (10 mg total) by mouth every 6 (six) hours as needed.  30 tablet  3  . sulfamethoxazole-trimethoprim (BACTRIM DS,SEPTRA DS) 800-160 MG per tablet Take 1 tablet by mouth every Monday, Wednesday, and Friday.  30 tablet  3  . temozolomide (TEMODAR) 100 MG capsule Take 4 capsules (400 mg total) by mouth daily. May take on an empty stomach or at bedtime to decrease nausea & vomiting.  20 capsule  3     No Known  Allergies:  Family History  Problem Relation Age of Onset  . Stroke Mother   . Stroke Father   . Diabetes Father   . Diabetes Sister   . Cancer Sister     instestinal cancer?   . Cancer Brother     prostate  . Cancer Maternal Aunt     breast  :  History   Social History  . Marital Status: Married    Spouse Name: N/A    Number of Children: 2  . Years of Education: N/A   Occupational History  .      refurnishing furniture   Social History Main Topics  . Smoking status: Never Smoker   . Smokeless tobacco: Never Used  . Alcohol Use: No  . Drug Use: No  . Sexually Active: Not Currently   Other Topics Concern  . Not on file   Social History Narrative  . No narrative on file  :  Pertinent items are noted in HPI.  Exam: ECOG 0.   General:  well-nourished man, in no acute distress.  Eyes:  no scleral icterus.  ENT:  There were no oropharyngeal lesions.  Neck was without thyromegaly.  Lymphatics:  Negative cervical, supraclavicular or axillary adenopathy.  Respiratory: lungs were clear bilaterally without wheezing or crackles.  Cardiovascular:  Regular rate and rhythm, S1/S2, without murmur, rub or gallop.  There was no pedal edema.  GI:  abdomen was soft, flat, nondistended.  I could feel enlarged liver edge which was tender to palpation of mid epigastric area.  Muscoloskeletal:  no spinal tenderness of palpation of vertebral spine.  Skin exam was without echymosis, petichae.  Neuro exam was nonfocal.  Patient was able to get on and off exam table without assistance.  Gait was normal.  Patient was alerted and oriented.  Attention was good.   Language was appropriate.  Mood was normal without depression.  Speech was not pressured.  Thought content was not tangential.     Lab Results  Component Value Date   WBC 14.9* 04/16/2012   HGB 12.2* 04/16/2012   HCT 34.8* 04/16/2012   PLT 261 04/16/2012   GLUCOSE 112* 04/16/2012   ALT 35 04/16/2012   AST 40* 04/16/2012   NA 136  04/16/2012   K 4.1 04/16/2012   CL 101 04/16/2012   CREATININE 1.09 04/16/2012   BUN 21 04/16/2012   CO2 25 04/16/2012   IMAGING: I personally reviewed the following CT and showed patient and his relatives the images.   Ct Abdomen Pelvis Wo Contrast  04/13/2012  *RADIOLOGY REPORT*  Clinical Data: As on recent ultrasound exam.  CT ABDOMEN AND PELVIS WITHOUT CONTRAST  Technique:  Multidetector CT imaging of the abdomen and pelvis was performed following the standard protocol without intravenous contrast.  Comparison: Ultrasound exam from yesterday.  Findings: Intravenous contrast was withheld, presumably secondary to the patient's renal  insufficiency.  Images which include the lower chest show asymmetric elevation of the right hemidiaphragm with some atelectasis at the right base.  As seen at ultrasound, the patient has a dominant, heterogeneous lesion in the central liver.  This is somewhat difficult to assess given the lack of intravenous contrast material, but it appears to arise from the caudate lobe and measures 14 x 10 x 12 cm. Other smaller lesions are scattered about the liver, including a 4.0 cm lesion in the inferior liver just anterior to the gallbladder and a lateral 1.9 cm lesion at the dome of the right liver.  Calcified granulomatous disease is seen in the spleen.  The stomach is decompressed.  The duodenum, pancreas, gallbladder, and adrenal glands are unremarkable.  Cortical scarring is noted in both kidneys without renal stone or hydronephrosis.  No definite renal mass is evident.  No abdominal aortic aneurysm.  No bulky retroperitoneal or intraperitoneal lymphadenopathy.  No evidence for bowel obstruction.  Imaging through the pelvis shows no free intraperitoneal fluid. There is no pelvic sidewall lymphadenopathy.  The prostate gland is enlarged.  No evidence for colonic mass.  The terminal ileum and the appendix are normal.  Bone windows reveal no worrisome lytic or sclerotic osseous lesions.   IMPRESSION: Dominant heterogeneous mass lesion in the central liver, apparently arising from the caudate lobe, measures up to 14 cm in diameter and generating mass effect in the porta hepatis and central abdomen. Other smaller hypoattenuating lesions are seen elsewhere in the liver parenchyma.  These changes may be related to primary liver neoplasm although no underlying features are evident on today's CT scan to suggest cirrhosis.  Metastatic disease would also be a consideration although no primary lesion is evident in the abdomen or pelvis.  The lack of intravenous contrast on today's study hinders assessment.   Original Report Authenticated By: Kennith Center, M.D.     Assessment and Plan:   1.  Hypertension:  He is on metoprolol.   2.  CHF:  euvolumic on exam today.  He is on Metoprolol.    3.  History of Vtach:  He has defibrillator/pacemaker; and is on Coumadin.  4.  History of CVA:  He is on Coumadin and blood pressure control.  5.  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  -Primary source:  Normally GI track. - Work up:  *  Octreotide-scan to see if any are light up to suggest primary source  *  GI referral for potential endoscopy and/or colonoscopy depending on octreotide scan.  *  CT chest to rule out lung mets.   -  Treatment:  *  Local therapy (such as surgery or radiation/or cryoablation) is appropriate at this time, in my opinion due to large size of tumor.  I will present your case at tumor board for formal consensus.   *  Chemotherapy: combination chemo pills:  Xeloda (1000mg /m2 BID on d1-14 every 28 days)+ Temodar (200mg /m2 PO daily on d10-14 every 28 day).  Chance of response about 40-50%.  Potential side effects include but not limited to hair loss, fatigue, mouth sore, low blood count, infection, bleeding, diarrhea, chest pain.   If there is a good response with chemotherapy and the cancer shrinks, then surgery maybe an  option (if there is no widespread disease).  I recommended Bactrim to decrease risk of infection.   Mr. Bosserman and his family expressed informed understanding and wished to proceed as recommended.  I will see patient  again in about 7-10 days to go over last minute question before starting therapy.    The length of time of the face-to-face encounter was 60 minutes. More than 50% of time was spent counseling and coordination of care.     Thank you for this referral.

## 2012-04-22 NOTE — Telephone Encounter (Signed)
Pt has order to refer to GI Dr. Elnoria Howard , pt does not have insurance, GI does not take pt without insurance

## 2012-04-22 NOTE — Telephone Encounter (Signed)
Gave pt appt for MD visit on 05/01/12 pt will have CT tomorrow and Octreotide end of February , MD aware. Pt does not have insurance and will be billed, informed pt that if they decide not to do the Octreotide to cancel 24 hrs prior to scan

## 2012-04-22 NOTE — Progress Notes (Signed)
Checked in new pt.  He has applied for Medicaid but it's pending.  I explained the financial assistance program to him and requested to make me aware of the outcome with Medicaid.

## 2012-04-23 ENCOUNTER — Ambulatory Visit (HOSPITAL_COMMUNITY)
Admission: RE | Admit: 2012-04-23 | Discharge: 2012-04-23 | Disposition: A | Discharge status: Home / Self Care | Payer: Medicaid Other | Source: Ambulatory Visit | Attending: Oncology | Admitting: Oncology

## 2012-04-23 DIAGNOSIS — Z95810 Presence of automatic (implantable) cardiac defibrillator: Secondary | ICD-10-CM | POA: Insufficient documentation

## 2012-04-23 DIAGNOSIS — C787 Secondary malignant neoplasm of liver and intrahepatic bile duct: Secondary | ICD-10-CM | POA: Insufficient documentation

## 2012-04-23 DIAGNOSIS — C7B8 Other secondary neuroendocrine tumors: Secondary | ICD-10-CM

## 2012-04-23 DIAGNOSIS — C7A00 Malignant carcinoid tumor of unspecified site: Secondary | ICD-10-CM | POA: Insufficient documentation

## 2012-04-23 DIAGNOSIS — K7689 Other specified diseases of liver: Secondary | ICD-10-CM | POA: Insufficient documentation

## 2012-04-23 DIAGNOSIS — J9819 Other pulmonary collapse: Secondary | ICD-10-CM | POA: Insufficient documentation

## 2012-04-23 DIAGNOSIS — R911 Solitary pulmonary nodule: Secondary | ICD-10-CM | POA: Insufficient documentation

## 2012-04-23 DIAGNOSIS — J8410 Pulmonary fibrosis, unspecified: Secondary | ICD-10-CM | POA: Insufficient documentation

## 2012-04-24 ENCOUNTER — Telehealth: Payer: Self-pay | Admitting: *Deleted

## 2012-04-24 DIAGNOSIS — C7B8 Other secondary neuroendocrine tumors: Secondary | ICD-10-CM

## 2012-04-24 NOTE — Telephone Encounter (Signed)
Daughter says Dr. Elnoria Howard will not accept pt w/o insurance.  Per Dr. Gaylyn Rong, ok to change referral for GI to [place].  Daughter verbalized understanding. Referral order placed, POF sent.

## 2012-04-27 ENCOUNTER — Encounter: Payer: Self-pay | Admitting: Gastroenterology

## 2012-04-27 ENCOUNTER — Telehealth: Payer: Self-pay | Admitting: Oncology

## 2012-04-27 ENCOUNTER — Ambulatory Visit (INDEPENDENT_AMBULATORY_CARE_PROVIDER_SITE_OTHER): Payer: Self-pay | Admitting: Pharmacist

## 2012-04-27 DIAGNOSIS — Z7901 Long term (current) use of anticoagulants: Secondary | ICD-10-CM

## 2012-04-27 DIAGNOSIS — I6350 Cerebral infarction due to unspecified occlusion or stenosis of unspecified cerebral artery: Secondary | ICD-10-CM

## 2012-04-27 DIAGNOSIS — I639 Cerebral infarction, unspecified: Secondary | ICD-10-CM

## 2012-04-27 LAB — CHROMOGRANIN A: Chromogranin A: 16 ng/mL — ABNORMAL HIGH (ref 1.9–15.0)

## 2012-04-27 LAB — 5 HIAA, QUANTITATIVE, URINE, 24 HOUR: 5-HIAA, 24 Hr Urine: 4.3 mg/24 h (ref <=6.0)

## 2012-04-27 NOTE — Progress Notes (Signed)
Agree 

## 2012-04-27 NOTE — Patient Instructions (Addendum)
Patient instructed to take medications as defined in the Anti-coagulation Track section of this encounter.  Patient instructed to take today's dose.  Patient verbalized understanding of these instructions.    

## 2012-04-27 NOTE — Telephone Encounter (Signed)
Pt will see Dr. Anselm Jungling GI @ [place] on 05/13/12 0915 called pt and left message also left message regarding $184 upfront go GI appt

## 2012-04-27 NOTE — Progress Notes (Signed)
Anti-Coagulation Progress Note  Jonathan Reed is a 67 y.o. male who is currently on an anti-coagulation regimen.    RECENT RESULTS: Recent results are below, the most recent result is correlated with a dose of 35 mg. per week:  Patient has a Rx for SMX-TMP on file that was to have commenced April 22, 2012. Patient/daughter states that they have NOT started this antibiotic and will do so tomorrow or Wednesday of this week. With INR = 2.8 on 35mg /wk, I must pre-emptively DECREASE (*and empirically) his warfarin in anticipation of a marked hypoprothrombenemic response to warfarin + SMX-TMP. Patient is to see his oncologist on Friday 14-FEB-14 (this coming Friday) at which time, PLEASE PERFORM FINGERSTICK INR. You may page me [phone] with results to discuss subsequent dosing--not because your group can not do this--but so both campuses will know of any dosing adjustment that may be necessary/performed/and to be documented. Thank you! Lab Results  Component Value Date   INR 2.80 04/27/2012   INR 1.71* 04/16/2012   INR 1.51* 04/15/2012    ANTI-COAG DOSE: Anticoagulation Dose Instructions as of 04/27/2012     [name] Tue Wed Thu Fri Sat   New Dose 5 mg 5 mg 2.5 mg 5 mg 2.5 mg 5 mg 5 mg       ANTICOAG SUMMARY: Anticoagulation Episode Summary   Current INR goal 2.0-3.0  Next INR check 05/01/2012  INR from last check 2.80 (04/27/2012)  Weekly max dose   Target end date Indefinite  [facility]  Preferred lab   Send INR reminders to    Indications  CVA (cerebral infarction) [434.91] Long term (current) use of anticoagulants [V58.61]        Comments         ANTICOAG TODAY: Anticoagulation Summary as of 04/27/2012   INR goal 2.0-3.0  Selected INR 2.80 (04/27/2012)  Next INR check 05/01/2012  Target end date Indefinite   Indications  CVA (cerebral infarction) [434.91] Long term (current) use of anticoagulants [V58.61]      Anticoagulation Episode Summary   [facility]   Preferred lab    Send INR reminders to    Comments       PATIENT INSTRUCTIONS: Patient Instructions  Patient instructed to take medications as defined in the Anti-coagulation Track section of this encounter.  Patient instructed to take today's dose.  Patient verbalized understanding of these instructions.       FOLLOW-UP Return in 4 days (on 05/01/2012) for Follow up INR while seeing Dr. Gaylyn Rong at [facility].  [name], III Pharm.D., CACP

## 2012-04-30 ENCOUNTER — Ambulatory Visit: Payer: Self-pay | Admitting: Cardiovascular Disease

## 2012-04-30 ENCOUNTER — Ambulatory Visit: Payer: Self-pay

## 2012-05-01 ENCOUNTER — Telehealth: Payer: Self-pay | Admitting: Pharmacist

## 2012-05-01 ENCOUNTER — Ambulatory Visit (HOSPITAL_BASED_OUTPATIENT_CLINIC_OR_DEPARTMENT_OTHER): Payer: Self-pay | Admitting: Oncology

## 2012-05-01 ENCOUNTER — Telehealth: Payer: Self-pay | Admitting: Oncology

## 2012-05-01 ENCOUNTER — Ambulatory Visit (HOSPITAL_BASED_OUTPATIENT_CLINIC_OR_DEPARTMENT_OTHER): Payer: Self-pay | Admitting: Lab

## 2012-05-01 VITALS — BP 149/95 | HR 60 | Temp 98.4°F | Resp 20 | Ht 71.0 in | Wt 193.1 lb

## 2012-05-01 DIAGNOSIS — C7A00 Malignant carcinoid tumor of unspecified site: Secondary | ICD-10-CM

## 2012-05-01 DIAGNOSIS — C787 Secondary malignant neoplasm of liver and intrahepatic bile duct: Secondary | ICD-10-CM

## 2012-05-01 DIAGNOSIS — D3A00 Benign carcinoid tumor of unspecified site: Secondary | ICD-10-CM

## 2012-05-01 DIAGNOSIS — I509 Heart failure, unspecified: Secondary | ICD-10-CM

## 2012-05-01 DIAGNOSIS — C7B8 Other secondary neuroendocrine tumors: Secondary | ICD-10-CM

## 2012-05-01 DIAGNOSIS — I639 Cerebral infarction, unspecified: Secondary | ICD-10-CM

## 2012-05-01 DIAGNOSIS — Z7901 Long term (current) use of anticoagulants: Secondary | ICD-10-CM

## 2012-05-01 LAB — BASIC METABOLIC PANEL (CC13)
BUN: 15.8 mg/dL (ref 7.0–26.0)
CO2: 24 mEq/L (ref 22–29)
Calcium: 9.7 mg/dL (ref 8.4–10.4)
Creatinine: 1.2 mg/dL (ref 0.7–1.3)
Glucose: 87 mg/dl (ref 70–99)

## 2012-05-01 LAB — CBC WITH DIFFERENTIAL/PLATELET
BASO%: 0.4 % (ref 0.0–2.0)
EOS%: 1.3 % (ref 0.0–7.0)
HCT: 33.3 % — ABNORMAL LOW (ref 38.4–49.9)
LYMPH%: 27 % (ref 14.0–49.0)
MCH: 31.4 pg (ref 27.2–33.4)
MCHC: 34.2 g/dL (ref 32.0–36.0)
MCV: 92 fL (ref 79.3–98.0)
MONO#: 0.6 10*3/uL (ref 0.1–0.9)
MONO%: 10.3 % (ref 0.0–14.0)
NEUT%: 61 % (ref 39.0–75.0)
Platelets: 344 10*3/uL (ref 140–400)
RBC: 3.63 10*6/uL — ABNORMAL LOW (ref 4.20–5.82)
WBC: 6.1 10*3/uL (ref 4.0–10.3)

## 2012-05-01 LAB — PROTIME-INR: Protime: 27.6 Seconds — ABNORMAL HIGH (ref 10.6–13.4)

## 2012-05-01 MED ORDER — CAPECITABINE 500 MG PO TABS: 2000.0000 mg | ORAL_TABLET | Freq: Two times a day (BID) | ORAL | Status: DC

## 2012-05-01 MED ORDER — OXYCODONE HCL 5 MG PO TABS: 5.0000 mg | ORAL_TABLET | Freq: Four times a day (QID) | ORAL | Status: DC | PRN

## 2012-05-01 NOTE — Patient Instructions (Addendum)
1.  Diagnosis:  Metastatic neuroendocrine tumor. 2.  Treatment:  I recommend chemotherapy first since resection or local therapy (with cryoablation or radioactive beads) at this time are difficult due to the size of the tumor in the liver .  We will repeat scan after a few months of therapy. Once the tumor is significantly smaller, surgery may consider resection at that time. 3.  Chemo:  Xeloda and Temodar.   *  Xeloda 2,000 mg by mouth twice daily (each time, take four of the 500mg  tablet) from day 1 through day 14 of every 28-day cycle.  *  Temodar 400mg  by mouth once daily (each time, take four of the 100mg  capsule) from day 10 through day 14 of every 28-day cycle.   4.  Potential side effects of this chemo regimen:  Fatigue, hair loss, mouth sore, nausea/vomiting, chest pain, diarrhea, skin rash, infection, bleeding, requiring transfusion.   5.  To minimize infection from this chemo, please take Bactrim DS, one tablet once on Mondays, Wednesdays, and Fridays.   6.  Follow up:  In about 2 weeks to ensure chemo are going well.

## 2012-05-01 NOTE — Telephone Encounter (Signed)
Daughter called with results of INR collected today at [facility] during a visit there. INR = 2.3 We checked this under the guise that he may have commenced upon SMX-TMP since last visit on 10-FEB-14 along with his oral chemotherapy drugs. Only ONE of the two chemotherapy drugs has been commenced she states (the other is due to arrive any day--it is being mailed and has been delayed due to the weather). As such, he has NOT commenced upon SMX-TMP. His INR has drifted down from 2.8 on Monday to 2.3 today. I am slightly increasing his warfarin to 1/2 tablet x 5mg  (2.5mg ) on TWO days of week (Tues/Thurs) and 1x5mg  warfarin all other days. He has a scheduled appointment at [facility] on Friday 28-FEB-14. I advised daughter that we will PLAN for an INR that day/date---UNTIL the SMX-TMP arrives---THEN, she is to CALL ME upon commencing SMX-TMP and we will re-evaluate when he will come back for FS POC INR (i.e. SOONER, given the anticipated marked hypoprothrombinemic response [warfarin + SMX-TMP]). She understands and indicates she will call me to reschedule/reduce warfarin dose.

## 2012-05-01 NOTE — Progress Notes (Signed)
[facility]  Telephone:(336) [phone] Fax:(336) [phone]   OFFICE PROGRESS NOTE   Cc:  Karle Plumber, MD  DIAGNOSIS:  Metastatic well-differentiated neuroendocrine tumor with met to liver.   PAST THERAPY: biopsy only.   CURRENT THERAPY: due to start Xeloda 1000mg /m2 BID d1-14; Temodar 200mg /m2 daily d10-14; every 28 day-cycle.   INTERVAL HISTORY: Jonathan Reed 67 y.o. male returns for final discussion before starting chemo.  He reported right toe pain.  He has ran out of Allopurinol for his gout a few months ago.  His big toe hurts slightly when he walks.  He denied fever, redness, open wound, purulent discharge.  He denied abdominal pain.  He takes Oxycodone about 1-2 x/day.   Patient denies fever, anorexia, weight loss, fatigue, headache, visual changes, confusion, drenching night sweats, palpable lymph node swelling, mucositis, odynophagia, dysphagia, nausea vomiting, jaundice, chest pain, palpitation, shortness of breath, dyspnea on exertion, productive cough, gum bleeding, epistaxis, hematemesis, hemoptysis, early satiety, melena, hematochezia, hematuria, skin rash, spontaneous bleeding, joint swelling, joint pain, heat or cold intolerance, bowel bladder incontinence, back pain, focal motor weakness, paresthesia, depression.    Past Medical History  Diagnosis Date  . CHF (congestive heart failure)     with ICD (f/u with [facility])  . CVA (cerebral infarction) 2011    No residual Sx  . CAD (coronary artery disease) 2000  . HTN (hypertension)     Past Surgical History  Procedure Laterality Date  . Permanent pacemaker insertion    . Hernia repair    . Sinus surgery with instatrak    . Colonoscopy  1980    colon polyps, no f/u since.     Current Outpatient Prescriptions  Medication Sig Dispense Refill  . levofloxacin (LEVAQUIN) 750 MG tablet Take 1 tablet (750 mg total) by mouth daily at 12 noon.  2 tablet  0  . metoprolol succinate (TOPROL-XL) 25 MG 24 hr  tablet Take 25 mg by mouth daily.      . ondansetron (ZOFRAN) 4 MG tablet Take 1 tablet (4 mg total) by mouth every 6 (six) hours as needed for nausea.  20 tablet  0  . oxyCODONE (ROXICODONE) 5 MG immediate release tablet Take 1 tablet (5 mg total) by mouth every 6 (six) hours as needed for pain.  30 tablet  0  . prochlorperazine (COMPAZINE) 10 MG tablet Take 1 tablet (10 mg total) by mouth every 6 (six) hours as needed.  30 tablet  3  . sulfamethoxazole-trimethoprim (BACTRIM DS,SEPTRA DS) 800-160 MG per tablet Take 1 tablet by mouth every Monday, Wednesday, and Friday.  30 tablet  3  . warfarin (COUMADIN) 5 MG tablet Take 2.5 mg (half a tablet) daily until 2/3.  Take 5mg  daily starting 2/4.  30 tablet  0  . capecitabine (XELODA) 500 MG tablet Take 4 tablets (2,000 mg total) by mouth 2 (two) times daily after a meal.  112 tablet  0  . temozolomide (TEMODAR) 100 MG capsule Take 4 capsules (400 mg total) by mouth daily. May take on an empty stomach or at bedtime to decrease nausea & vomiting.  20 capsule  3   No current facility-administered medications for this visit.    ALLERGIES:  has No Known Allergies.  REVIEW OF SYSTEMS:  The rest of the 14-point review of system was negative.   Filed Vitals:   05/01/12 0856  BP: 149/95  Pulse: 60  Temp: 98.4 F (36.9 C)  Resp: 20   Wt Readings  from Last 3 Encounters:  05/01/12 193 lb 1.6 oz (87.59 kg)  04/22/12 195 lb 3.2 oz (88.542 kg)  04/21/12 194 lb 4.8 oz (88.134 kg)   ECOG Performance status: 1  PHYSICAL EXAMINATION:   General:  well-nourished man in no acute distress.  Eyes:  no scleral icterus.  ENT:  There were no oropharyngeal lesions.  Neck was without thyromegaly.  Lymphatics:  Negative cervical, supraclavicular or axillary adenopathy.  Respiratory: lungs were clear bilaterally without wheezing or crackles.  Cardiovascular:  Regular rate and rhythm, S1/S2, without murmur, rub or gallop.  There was no pedal edema.  GI:  abdomen was  soft, flat, nondistended, without organomegaly.  Positive for organomegaly with positive tenderness to the hepatic edge upon deep inspiration. Musculoskeletal:  no spinal tenderness of palpation of vertebral spine.  Skin exam was without echymosis, petichae.  Neuro exam was nonfocal.  Patient was able to get on and off exam table without assistance.  Gait was normal.  Patient was alerted and oriented.  Attention was good.   Language was appropriate.  Mood was normal without depression.  Speech was not pressured.  Thought content was not tangential.      LABORATORY/RADIOLOGY DATA:  Lab Results  Component Value Date   WBC 6.1 05/01/2012   HGB 11.4* 05/01/2012   HCT 33.3* 05/01/2012   PLT 344 05/01/2012   GLUCOSE 87 05/01/2012   ALKPHOS 203* 04/16/2012   ALT 35 04/16/2012   AST 40* 04/16/2012   NA 137 05/01/2012   K 4.0 05/01/2012   CL 104 05/01/2012   CREATININE 1.2 05/01/2012   BUN 15.8 05/01/2012   CO2 24 05/01/2012   INR 2.30 05/01/2012   HGBA1C 6.5* 04/12/2012     ASSESSMENT AND PLAN:   1. Hypertension: He is on metoprolol.  2. CHF: euvolumic on exam today. He is on Metoprolol.  3. History of Vtach: He has defibrillator/pacemaker; and is on Coumadin.  4. History of CVA: He is on Coumadin and blood pressure control.  I will cc his PCP his INR while he is on Xeloda due to potential increased INR.    5.  Diagnosis:  Metastatic neuroendocrine tumor. -  Treatment:  I recommend chemotherapy first since resection or local therapy (with cryoablation or radioactive beads) at this time are difficult due to the size of the tumor in the liver .  We will repeat scan after a few months of therapy. Once the tumor is significantly smaller, surgery may consider resection at that time. -  Chemo:  Xeloda and Temodar.   *  Xeloda 2,000 mg by mouth twice daily (each time, take four of the 500mg  tablet) from day 1 through day 14 of every 28-day cycle.  *  Temodar 400mg  by mouth once daily (each time, take four of  the 100mg  capsule) from day 10 through day 14 of every 28-day cycle.   -  Potential side effects of this chemo regimen:  Fatigue, hair loss, mouth sore, nausea/vomiting, chest pain, diarrhea, skin rash, infection, bleeding, requiring transfusion.   -To minimize infection from this chemo, please take Bactrim DS, one tablet once on Mondays, Wednesdays, and Fridays.   Mr. Gift and his family expressed informed understanding and wished to proceed with treatment as recommended.  He has octreotide scan and GI eval next week.   6.  Follow up:  In about 1 week to check INR and in 2 weeks to ensure chemo are going well.     The length  of time of the face-to-face encounter was 40 minutes. More than 50% of time was spent counseling and coordination of care.

## 2012-05-06 ENCOUNTER — Ambulatory Visit (INDEPENDENT_AMBULATORY_CARE_PROVIDER_SITE_OTHER): Payer: Self-pay | Admitting: Pharmacist

## 2012-05-06 DIAGNOSIS — Z7901 Long term (current) use of anticoagulants: Secondary | ICD-10-CM

## 2012-05-06 LAB — POCT INR: INR: 2.2

## 2012-05-06 NOTE — Patient Instructions (Signed)
Patient instructed to take medications as defined in the Anti-coagulation Track section of this encounter.  Patient instructed to take today's dose.  Patient verbalized understanding of these instructions.    

## 2012-05-06 NOTE — Progress Notes (Signed)
Anti-Coagulation Progress Note  KENSHIN SPLAWN is a 67 y.o. male who is currently on an anti-coagulation regimen.    RECENT RESULTS: Recent results are below, the most recent result is correlated with a dose of 30 mg. per week: Lab Results  Component Value Date   INR 2.20 05/06/2012   INR 2.30 05/01/2012   INR 2.80 04/27/2012   PROTIME 27.6* 05/01/2012    ANTI-COAG DOSE: Anticoagulation Dose Instructions as of 05/06/2012     [name] Tue Wed Thu Fri Sat   New Dose 5 mg 5 mg 2.5 mg 5 mg 2.5 mg 5 mg 5 mg       ANTICOAG SUMMARY: Anticoagulation Episode Summary   Current INR goal 2.0-3.0  Next INR check 05/11/2012  INR from last check 2.20 (05/06/2012)  Weekly max dose   Target end date Indefinite  [facility]  Preferred lab   Send INR reminders to    Indications  CVA (cerebral infarction) [434.91] Long term (current) use of anticoagulants [V58.61]        Comments         ANTICOAG TODAY: Anticoagulation Summary as of 05/06/2012   INR goal 2.0-3.0  Selected INR 2.20 (05/06/2012)  Next INR check 05/11/2012  Target end date Indefinite   Indications  CVA (cerebral infarction) [434.91] Long term (current) use of anticoagulants [V58.61]      Anticoagulation Episode Summary   [facility]   Preferred lab    Send INR reminders to    Comments       PATIENT INSTRUCTIONS: Patient Instructions  Patient instructed to take medications as defined in the Anti-coagulation Track section of this encounter.  Patient instructed to take today's dose.  Patient verbalized understanding of these instructions.       FOLLOW-UP Return in 5 days (on 05/11/2012) for Follow up INR at 2PM.  [name], III Pharm.D., CACP

## 2012-05-08 ENCOUNTER — Other Ambulatory Visit (HOSPITAL_BASED_OUTPATIENT_CLINIC_OR_DEPARTMENT_OTHER): Payer: Self-pay | Admitting: Lab

## 2012-05-08 DIAGNOSIS — C7A00 Malignant carcinoid tumor of unspecified site: Secondary | ICD-10-CM

## 2012-05-08 DIAGNOSIS — I509 Heart failure, unspecified: Secondary | ICD-10-CM

## 2012-05-08 DIAGNOSIS — C787 Secondary malignant neoplasm of liver and intrahepatic bile duct: Secondary | ICD-10-CM

## 2012-05-08 DIAGNOSIS — I639 Cerebral infarction, unspecified: Secondary | ICD-10-CM

## 2012-05-08 LAB — PROTIME-INR

## 2012-05-11 ENCOUNTER — Ambulatory Visit (INDEPENDENT_AMBULATORY_CARE_PROVIDER_SITE_OTHER): Payer: Self-pay | Admitting: Pharmacist

## 2012-05-11 ENCOUNTER — Encounter: Payer: Self-pay | Admitting: Oncology

## 2012-05-11 LAB — POCT INR: INR: 2.1

## 2012-05-11 NOTE — Patient Instructions (Signed)
Patient instructed to take medications as defined in the Anti-coagulation Track section of this encounter.  Patient instructed to take today's dose.  Patient verbalized understanding of these instructions.    

## 2012-05-11 NOTE — Progress Notes (Signed)
Pt is approved for 95% discount financial assistance effective 05/11/12 - 11/08/12.  I will mail approval letter and green card today.

## 2012-05-11 NOTE — Progress Notes (Signed)
Anti-Coagulation Progress Note  Jonathan Reed is a 67 y.o. male who is currently on an anti-coagulation regimen.    RECENT RESULTS: Recent results are below, the most recent result is correlated with a dose of 30 mg. per week: Lab Results  Component Value Date   INR 2.10 05/11/2012   INR 2.20 05/08/2012   INR 2.20 05/06/2012   PROTIME 26.4* 05/08/2012    ANTI-COAG DOSE: Anticoagulation Dose Instructions as of 05/11/2012     [name] Tue Wed Thu Fri Sat   New Dose 5 mg 5 mg 5 mg 2.5 mg 5 mg 5 mg 5 mg       ANTICOAG SUMMARY: Anticoagulation Episode Summary   Current INR goal 2.0-3.0  Next INR check 05/18/2012  INR from last check 2.10 (05/11/2012)  Weekly max dose   Target end date Indefinite  [facility]  Preferred lab   Send INR reminders to    Indications  CVA (cerebral infarction) [434.91] Long term (current) use of anticoagulants [V58.61]        Comments         ANTICOAG TODAY: Anticoagulation Summary as of 05/11/2012   INR goal 2.0-3.0  Selected INR 2.10 (05/11/2012)  Next INR check 05/18/2012  Target end date Indefinite   Indications  CVA (cerebral infarction) [434.91] Long term (current) use of anticoagulants [V58.61]      Anticoagulation Episode Summary   [facility]   Preferred lab    Send INR reminders to    Comments       PATIENT INSTRUCTIONS: Patient Instructions  Patient instructed to take medications as defined in the Anti-coagulation Track section of this encounter.  Patient instructed to take today's dose.  Patient verbalized understanding of these instructions.       FOLLOW-UP Return in 7 days (on 05/18/2012) for Follow up INR at 2PM.  [name], III Pharm.D., CACP

## 2012-05-12 ENCOUNTER — Encounter (HOSPITAL_COMMUNITY)
Admission: RE | Admit: 2012-05-12 | Discharge: 2012-05-12 | Disposition: A | Discharge status: Home / Self Care | Payer: Medicaid Other | Source: Ambulatory Visit | Attending: Oncology | Admitting: Oncology

## 2012-05-12 DIAGNOSIS — C7A00 Malignant carcinoid tumor of unspecified site: Secondary | ICD-10-CM | POA: Insufficient documentation

## 2012-05-12 DIAGNOSIS — C787 Secondary malignant neoplasm of liver and intrahepatic bile duct: Secondary | ICD-10-CM | POA: Insufficient documentation

## 2012-05-12 DIAGNOSIS — C7B8 Other secondary neuroendocrine tumors: Secondary | ICD-10-CM

## 2012-05-12 DIAGNOSIS — K639 Disease of intestine, unspecified: Secondary | ICD-10-CM | POA: Insufficient documentation

## 2012-05-13 ENCOUNTER — Encounter: Payer: Self-pay | Admitting: Gastroenterology

## 2012-05-13 ENCOUNTER — Encounter (HOSPITAL_COMMUNITY)
Admission: RE | Admit: 2012-05-13 | Discharge: 2012-05-13 | Disposition: A | Discharge status: Home / Self Care | Payer: Medicaid Other | Source: Ambulatory Visit | Attending: Oncology | Admitting: Oncology

## 2012-05-13 ENCOUNTER — Ambulatory Visit (INDEPENDENT_AMBULATORY_CARE_PROVIDER_SITE_OTHER): Payer: Self-pay | Admitting: Gastroenterology

## 2012-05-13 VITALS — BP 100/72 | HR 76 | Ht 69.25 in | Wt 186.2 lb

## 2012-05-13 DIAGNOSIS — Z7901 Long term (current) use of anticoagulants: Secondary | ICD-10-CM

## 2012-05-13 DIAGNOSIS — D3A00 Benign carcinoid tumor of unspecified site: Secondary | ICD-10-CM

## 2012-05-13 MED ORDER — INDIUM IN-111 PENTETREOTIDE IV KIT
6.4000 | PACK | Freq: Once | INTRAVENOUS | Status: AC | PRN
  Administered 2012-05-13: 6.4 via INTRAVENOUS

## 2012-05-13 NOTE — Progress Notes (Signed)
History of Present Illness: This is a 67 year old male with metastatic neuroendocrine tumor accompanied by 2 family members. He has a large liver lesion and potentially other smaller liver lesions. An octreotide scan is pending. He is sent for consideration of upper endoscopy and colonoscopy to potentially evaluate for primary source. He has been started on chemotherapy for metastatic neuroendocrine tumor. He has no gastrointestinal complaints. No prior colonoscopy. He suffered a CVA when anticoagulation was previously held. Recent abdominal/pelvic CT scan impression as below. Denies weight loss, abdominal pain, constipation, diarrhea, change in stool caliber, melena, hematochezia, nausea, vomiting, dysphagia, reflux symptoms, chest pain.  CT: Dominant heterogeneous mass lesion in the central liver, apparently  arising from the caudate lobe, measures up to 14 cm in diameter and  generating mass effect in the porta hepatis and central abdomen.  Other smaller hypoattenuating lesions are seen elsewhere in the  liver parenchyma. These changes may be related to primary liver  neoplasm although no underlying features are evident on today's CT  scan to suggest cirrhosis. Metastatic disease would also be a  consideration although no primary lesion is evident in the abdomen  or pelvis. The lack of intravenous contrast on today's study  hinders assessment.   Review of Systems: Pertinent positive and negative review of systems were noted in the above HPI section. All other review of systems were otherwise negative.  Current Medications, Allergies, Past Medical History, Past Surgical History, Family History and Social History were reviewed in [employer] record.  Physical Exam: General: Well developed , well nourished, no acute distress Head: Normocephalic and atraumatic Eyes:  sclerae anicteric, EOMI Ears: Normal auditory acuity Mouth: No deformity or lesions Neck: Supple, no  masses or thyromegaly Lungs: Clear throughout to auscultation Heart: Regular rate and rhythm; no murmurs, rubs or bruits Abdomen: Soft, non tender and non distended. No masses, hepatosplenomegaly or hernias noted. Normal Bowel sounds Musculoskeletal: Symmetrical with no gross deformities  Skin: No lesions on visible extremities Pulses:  Normal pulses noted Extremities: No clubbing, cyanosis, edema or deformities noted Neurological: Alert oriented x 4, grossly nonfocal Cervical Nodes:  No significant cervical adenopathy Inguinal Nodes: No significant inguinal adenopathy Psychological:  Alert and cooperative. Normal mood and affect  Assessment and Recommendations:  1. CHF   2. Vtach. Defibrillator/pacemaker and on Coumadin.   3. Prior CVA: He is maintained on Coumadin. His CVA occurred when Coumadin was held for a procedure.   4. Metastatic neuroendocrine tumor. Although the primary site is not clear it is generally not indicated to pursue colonoscopy and upper endoscopy to localize primary source unless management would be significantly different. In this patient's situation he is at much higher risk of complications from procedures due to his cardiac comorbidities and history of a CVA occurring when Coumadin was held. I do not recommend proceeding with colonoscopy or endoscopy or holding Coumadin given the low benefits and high-risk. The patient is fully in agreement with this plan. I attempted to answer all his questions. Return to Dr. Gaylyn Rong ongoing care.

## 2012-05-13 NOTE — Patient Instructions (Addendum)
Thank you for choosing me and [facility].  Malcolm T. Stark, Jr., MD., FACG  

## 2012-05-14 ENCOUNTER — Encounter (HOSPITAL_COMMUNITY): Admission: RE | Admit: 2012-05-14 | Payer: Medicaid Other | Source: Ambulatory Visit

## 2012-05-15 ENCOUNTER — Other Ambulatory Visit: Payer: Self-pay | Admitting: Internal Medicine

## 2012-05-15 ENCOUNTER — Telehealth: Payer: Self-pay | Admitting: Oncology

## 2012-05-15 ENCOUNTER — Ambulatory Visit (HOSPITAL_BASED_OUTPATIENT_CLINIC_OR_DEPARTMENT_OTHER): Payer: Self-pay | Admitting: Oncology

## 2012-05-15 ENCOUNTER — Other Ambulatory Visit (HOSPITAL_BASED_OUTPATIENT_CLINIC_OR_DEPARTMENT_OTHER): Payer: Self-pay | Admitting: Lab

## 2012-05-15 ENCOUNTER — Encounter: Payer: Self-pay | Admitting: Oncology

## 2012-05-15 VITALS — BP 128/84 | HR 60 | Temp 97.7°F | Resp 20 | Ht 69.25 in | Wt 189.9 lb

## 2012-05-15 DIAGNOSIS — C7B8 Other secondary neuroendocrine tumors: Secondary | ICD-10-CM

## 2012-05-15 DIAGNOSIS — Z7901 Long term (current) use of anticoagulants: Secondary | ICD-10-CM

## 2012-05-15 DIAGNOSIS — I509 Heart failure, unspecified: Secondary | ICD-10-CM

## 2012-05-15 DIAGNOSIS — Z8673 Personal history of transient ischemic attack (TIA), and cerebral infarction without residual deficits: Secondary | ICD-10-CM

## 2012-05-15 DIAGNOSIS — I639 Cerebral infarction, unspecified: Secondary | ICD-10-CM

## 2012-05-15 DIAGNOSIS — C7A00 Malignant carcinoid tumor of unspecified site: Secondary | ICD-10-CM

## 2012-05-15 DIAGNOSIS — I4891 Unspecified atrial fibrillation: Secondary | ICD-10-CM

## 2012-05-15 LAB — PROTIME-INR: Protime: 30 Seconds — ABNORMAL HIGH (ref 10.6–13.4)

## 2012-05-15 LAB — COMPREHENSIVE METABOLIC PANEL (CC13)
ALT: 19 U/L (ref 0–55)
Alkaline Phosphatase: 154 U/L — ABNORMAL HIGH (ref 40–150)
Sodium: 137 mEq/L (ref 136–145)
Total Bilirubin: 0.41 mg/dL (ref 0.20–1.20)
Total Protein: 8 g/dL (ref 6.4–8.3)

## 2012-05-15 LAB — CBC WITH DIFFERENTIAL/PLATELET
BASO%: 0.7 % (ref 0.0–2.0)
MCHC: 35.1 g/dL (ref 32.0–36.0)
MONO#: 0.4 10*3/uL (ref 0.1–0.9)
RBC: 3.93 10*6/uL — ABNORMAL LOW (ref 4.20–5.82)
RDW: 15 % — ABNORMAL HIGH (ref 11.0–14.6)
WBC: 6 10*3/uL (ref 4.0–10.3)
lymph#: 1.9 10*3/uL (ref 0.9–3.3)

## 2012-05-15 MED ORDER — ONDANSETRON HCL 8 MG PO TABS: 8.0000 mg | ORAL_TABLET | Freq: Two times a day (BID) | ORAL | Status: DC | PRN

## 2012-05-15 NOTE — Progress Notes (Signed)
[facility]  Telephone:(336) [phone] Fax:(336) [phone]   OFFICE PROGRESS NOTE   Cc:  Ky Barban, MD  DIAGNOSIS:  Metastatic well-differentiated neuroendocrine tumor with met to liver.   PAST THERAPY: biopsy only.   CURRENT THERAPY: Started Xeloda 1000mg /m2 BID d1-14; Temodar 200mg /m2 daily d10-14; every 28 day-cycle on 05/04/12.  INTERVAL HISTORY: Jonathan Reed 67 y.o. male returns for routine follow up with his wife and daughter . Started his chemotherapy almost 2 weeks ago.Had one day of nausea and vomiting when her first started, but none since. Using Zofran with relief. He denied abdominal pain. No diarrhea or rashes. No bleeding.     Patient denies fever, anorexia, weight loss, fatigue, headache, visual changes, confusion, drenching night sweats, palpable lymph node swelling, mucositis, odynophagia, dysphagia, nausea vomiting, jaundice, chest pain, palpitation, shortness of breath, dyspnea on exertion, productive cough, gum bleeding, epistaxis, hematemesis, hemoptysis, early satiety, melena, hematochezia, hematuria, skin rash, spontaneous bleeding, joint swelling, joint pain, heat or cold intolerance, bowel bladder incontinence, back pain, focal motor weakness, paresthesia, depression.    Past Medical History  Diagnosis Date  . CHF (congestive heart failure)     with ICD (f/u with [facility])  . CVA (cerebral infarction) 2011    No residual Sx  . CAD (coronary artery disease) 2000  . HTN (hypertension)   . Carcinoid syndrome   . Metastatic malignant neuroendocrine tumor to liver     Past Surgical History  Procedure Laterality Date  . Cardiac defibrillator placement    . Inguinal hernia repair Bilateral   . Sinus surgery with instatrak    . Colonoscopy  1980    colon polyps, no f/u since.     Current Outpatient Prescriptions  Medication Sig Dispense Refill  . capecitabine (XELODA) 500 MG tablet Take 4 tablets (2,000 mg total) by mouth 2 (two) times  daily after a meal.  112 tablet  0  . metoprolol succinate (TOPROL-XL) 25 MG 24 hr tablet Take 25 mg by mouth daily.      . ondansetron (ZOFRAN) 8 MG tablet Take 1 tablet (8 mg total) by mouth every 12 (twelve) hours as needed for nausea.  20 tablet  3  . oxyCODONE (ROXICODONE) 5 MG immediate release tablet Take 1 tablet (5 mg total) by mouth every 6 (six) hours as needed for pain.  30 tablet  0  . prochlorperazine (COMPAZINE) 10 MG tablet Take 1 tablet (10 mg total) by mouth every 6 (six) hours as needed.  30 tablet  3  . sulfamethoxazole-trimethoprim (BACTRIM DS,SEPTRA DS) 800-160 MG per tablet Take 1 tablet by mouth every Monday, Wednesday, and Friday.  30 tablet  3  . temozolomide (TEMODAR) 100 MG capsule Take 4 capsules (400 mg total) by mouth daily. May take on an empty stomach or at bedtime to decrease nausea & vomiting.  20 capsule  3  . warfarin (COUMADIN) 5 MG tablet Take as directed.  30 tablet  0   No current facility-administered medications for this visit.    ALLERGIES:  has No Known Allergies.  REVIEW OF SYSTEMS:  The rest of the 14-point review of system was negative.   Filed Vitals:   05/15/12 1334  BP: 128/84  Pulse: 60  Temp: 97.7 F (36.5 C)  Resp: 20   Wt Readings from Last 3 Encounters:  05/15/12 189 lb 14.4 oz (86.138 kg)  05/13/12 186 lb 4 oz (84.482 kg)  05/01/12 193 lb 1.6 oz (87.59 kg)   ECOG  Performance status: 1  PHYSICAL EXAMINATION:   General:  well-nourished man in no acute distress.  Eyes:  no scleral icterus.  ENT:  There were no oropharyngeal lesions.  Neck was without thyromegaly.  Lymphatics:  Negative cervical, supraclavicular or axillary adenopathy.  Respiratory: lungs were clear bilaterally without wheezing or crackles.  Cardiovascular:  Regular rate and rhythm, S1/S2, without murmur, rub or gallop.  There was no pedal edema.  GI:  abdomen was soft, flat, nondistended.  Positive for organomegaly with positive tenderness to the hepatic edge upon  deep inspiration. Musculoskeletal:  no spinal tenderness of palpation of vertebral spine.  Skin exam was without echymosis, petichae.  Neuro exam was nonfocal.  Patient was able to get on and off exam table without assistance.  Gait was normal.  Patient was alerted and oriented.  Attention was good.   Language was appropriate.  Mood was normal without depression.  Speech was not pressured.  Thought content was not tangential.      LABORATORY/RADIOLOGY DATA:  Lab Results  Component Value Date   WBC 6.0 05/15/2012   HGB 12.7* 05/15/2012   HCT 36.3* 05/15/2012   PLT 241 05/15/2012   GLUCOSE 103* 05/15/2012   ALKPHOS 154* 05/15/2012   ALT 19 05/15/2012   AST 18 05/15/2012   NA 137 05/15/2012   K 4.1 05/15/2012   CL 101 05/15/2012   CREATININE 1.4* 05/15/2012   BUN 18.6 05/15/2012   CO2 26 05/15/2012   INR 2.50 05/15/2012   HGBA1C 6.5* 04/12/2012   IMAGING:   *RADIOLOGY REPORT*  Clinical Data: Metastatic carcinoid tumor into the liver. Unknown  primary.  NUCLEAR MEDICINE OCTREOTIDE LOCALIZATION  Comparison: CT scan 04/13/2012.  Findings: There is diffuse heterogeneous uptake in the liver  consistent with known metastatic liver disease. Asymmetric focus  of uptake is noted in the right abdomen inferior to the right  kidney. This is best seen on the SPECT images. Correlating this  finding with the CT scan there is a small soft tissue mass  involving the distal ileum near the terminal ileum.  Activity in the left groin area is most likely urinary  contamination. I do not see any lymph nodes in this area on the  recent CT scan.  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  Original Report Authenticated By: Rudie Meyer, M.D.   ASSESSMENT AND PLAN:   1. Hypertension: He is on metoprolol.  2. CHF: euvolumic on exam today. He is on Metoprolol.  3. History of Vtach: He has defibrillator/pacemaker; and is on Coumadin.  4. History  of CVA: He is on Coumadin and blood pressure control.  I will cc his PCP his INR while he is on Xeloda due to potential increased INR.    5.  Diagnosis:  Metastatic neuroendocrine tumor. -  Treatment:  I recommend chemotherapy first since resection or local therapy (with cryoablation or radioactive beads) at this time are difficult due to the size of the tumor in the liver .  We will repeat scan after a few months of therapy. Once the tumor is significantly smaller, surgery may consider resection at that time. -  Chemo:  Xeloda and Temodar.   *  Xeloda 2,000 mg by mouth twice daily (each time, take four of the 500mg  tablet) from day 1 through day 14 of every 28-day cycle.  *  Temodar 400mg  by mouth once daily (each time, take four of the 100mg  capsule) from  day 10 through day 14 of every 28-day cycle.   -  Tolerating chemotherapy well at this time. Recommend that he continue chemo without dose modification.   -To minimize infection from this chemo, he will continue Bactrim DS, one tablet once on Mondays, Wednesdays, and Fridays.   6.  Follow up:  In about 2 weeks prior to beginning next cycle of chemo.     The length of time of the face-to-face encounter was 30 minutes. More than 50% of time was spent counseling and coordination of care.

## 2012-05-15 NOTE — Telephone Encounter (Signed)
Gave pt appt for lab and MD for March 2014 °

## 2012-05-18 ENCOUNTER — Ambulatory Visit: Payer: Self-pay

## 2012-05-19 ENCOUNTER — Ambulatory Visit (INDEPENDENT_AMBULATORY_CARE_PROVIDER_SITE_OTHER): Payer: Self-pay | Admitting: Pharmacist

## 2012-05-19 DIAGNOSIS — I639 Cerebral infarction, unspecified: Secondary | ICD-10-CM

## 2012-05-19 NOTE — Patient Instructions (Signed)
Patient instructed to take medications as defined in the Anti-coagulation Track section of this encounter.  Patient instructed to take today's dose.  Patient verbalized understanding of these instructions.    

## 2012-05-19 NOTE — Progress Notes (Signed)
Anti-Coagulation Progress Note  Jonathan Reed is a 67 y.o. male who is currently on an anti-coagulation regimen.    RECENT RESULTS: Recent results are below, the most recent result is correlated with a dose of 32.5 mg. per week: Lab Results  Component Value Date   INR 3.0 05/19/2012   INR 2.50 05/15/2012   INR 2.10 05/11/2012   PROTIME 30.0* 05/15/2012    ANTI-COAG DOSE: Anticoagulation Dose Instructions as of 05/19/2012     [name] Tue Wed Thu Fri Sat   New Dose 5 mg 5 mg 2.5 mg 5 mg 5 mg 2.5 mg 5 mg       ANTICOAG SUMMARY: Anticoagulation Episode Summary   Current INR goal 2.0-3.0  Next INR check 06/01/2012  INR from last check 3.0 (05/19/2012)  Weekly max dose   Target end date Indefinite  [facility]  Preferred lab   Send INR reminders to    Indications  CVA (cerebral infarction) [434.91] Long term (current) use of anticoagulants [V58.61]        Comments         ANTICOAG TODAY: Anticoagulation Summary as of 05/19/2012   INR goal 2.0-3.0  Selected INR 3.0 (05/19/2012)  Next INR check 06/01/2012  Target end date Indefinite   Indications  CVA (cerebral infarction) [434.91] Long term (current) use of anticoagulants [V58.61]      Anticoagulation Episode Summary   [facility]   Preferred lab    Send INR reminders to    Comments       PATIENT INSTRUCTIONS: Patient Instructions  Patient instructed to take medications as defined in the Anti-coagulation Track section of this encounter.  Patient instructed to take today's dose.  Patient verbalized understanding of these instructions.       FOLLOW-UP Return in 2 weeks (on 06/01/2012) for Follow up INR at 2:15PM.  [name], III Pharm.D., CACP

## 2012-05-26 ENCOUNTER — Ambulatory Visit: Payer: Self-pay | Admitting: Cardiovascular Disease

## 2012-05-27 ENCOUNTER — Telehealth: Payer: Self-pay | Admitting: Internal Medicine

## 2012-05-27 ENCOUNTER — Telehealth: Payer: Self-pay | Admitting: *Deleted

## 2012-05-27 NOTE — Telephone Encounter (Signed)
Notifed by daughter that pt. Did not begin oral chemo today.  Would like to know what she should do.  Assessment/plan: 67 yo with metastatic neuroendocrine tumor whose daughter calls for instructions regarding admin of chemo since her father did not begin cycle today. -Instructed pt. To start Xeloda tomorrow and continue as previously instructed for 14 days with Temodar on days 10-14 q 28 days

## 2012-05-27 NOTE — Telephone Encounter (Signed)
Patient pharmacy sending request for dose reduction in Zofran 8mg , patient thinks this is too strong. Attempted to call patient several times, Clenton Pare, NP ok for patient to have 4mg  tabs, however, patient just had filled on 05/15/12.  Called and left message for patient to take 1/2-1 tab as needed. As long as pill is scored, patient can use pill cutter to reduce dose to prevent another whole new prescription with new co-pay.

## 2012-06-01 ENCOUNTER — Ambulatory Visit (HOSPITAL_BASED_OUTPATIENT_CLINIC_OR_DEPARTMENT_OTHER): Payer: Medicaid Other | Admitting: Oncology

## 2012-06-01 ENCOUNTER — Encounter: Payer: Self-pay | Admitting: *Deleted

## 2012-06-01 ENCOUNTER — Telehealth: Payer: Self-pay | Admitting: Oncology

## 2012-06-01 ENCOUNTER — Ambulatory Visit (INDEPENDENT_AMBULATORY_CARE_PROVIDER_SITE_OTHER): Payer: Self-pay | Admitting: Pharmacist

## 2012-06-01 ENCOUNTER — Encounter: Payer: Self-pay | Admitting: Oncology

## 2012-06-01 ENCOUNTER — Other Ambulatory Visit (HOSPITAL_BASED_OUTPATIENT_CLINIC_OR_DEPARTMENT_OTHER): Payer: Medicaid Other | Admitting: Lab

## 2012-06-01 VITALS — BP 151/101 | HR 58 | Temp 97.9°F | Resp 20 | Ht 70.0 in | Wt 196.6 lb

## 2012-06-01 DIAGNOSIS — I509 Heart failure, unspecified: Secondary | ICD-10-CM

## 2012-06-01 DIAGNOSIS — I639 Cerebral infarction, unspecified: Secondary | ICD-10-CM

## 2012-06-01 DIAGNOSIS — C7B8 Other secondary neuroendocrine tumors: Secondary | ICD-10-CM

## 2012-06-01 DIAGNOSIS — I6350 Cerebral infarction due to unspecified occlusion or stenosis of unspecified cerebral artery: Secondary | ICD-10-CM

## 2012-06-01 DIAGNOSIS — C7A00 Malignant carcinoid tumor of unspecified site: Secondary | ICD-10-CM

## 2012-06-01 DIAGNOSIS — C787 Secondary malignant neoplasm of liver and intrahepatic bile duct: Secondary | ICD-10-CM

## 2012-06-01 DIAGNOSIS — Z7901 Long term (current) use of anticoagulants: Secondary | ICD-10-CM

## 2012-06-01 LAB — CBC WITH DIFFERENTIAL/PLATELET
Basophils Absolute: 0 10*3/uL (ref 0.0–0.1)
EOS%: 3.3 % (ref 0.0–7.0)
HGB: 13 g/dL (ref 13.0–17.1)
LYMPH%: 38.1 % (ref 14.0–49.0)
MCH: 32 pg (ref 27.2–33.4)
MCV: 93.9 fL (ref 79.3–98.0)
MONO%: 12.3 % (ref 0.0–14.0)
Platelets: 165 10*3/uL (ref 140–400)
RBC: 4.08 10*6/uL — ABNORMAL LOW (ref 4.20–5.82)
RDW: 17 % — ABNORMAL HIGH (ref 11.0–14.6)

## 2012-06-01 LAB — COMPREHENSIVE METABOLIC PANEL (CC13)
AST: 17 U/L (ref 5–34)
Albumin: 3.5 g/dL (ref 3.5–5.0)
Alkaline Phosphatase: 104 U/L (ref 40–150)
BUN: 13.2 mg/dL (ref 7.0–26.0)
Potassium: 3.7 mEq/L (ref 3.5–5.1)
Total Bilirubin: 0.45 mg/dL (ref 0.20–1.20)

## 2012-06-01 MED ORDER — ONDANSETRON HCL 4 MG PO TABS: 4.0000 mg | ORAL_TABLET | Freq: Two times a day (BID) | ORAL | Status: DC | PRN

## 2012-06-01 NOTE — Progress Notes (Signed)
Put daughter's fmla form in registration desk. °

## 2012-06-01 NOTE — Progress Notes (Signed)
[facility]  Telephone:(336) [phone] Fax:(336) [phone]   OFFICE PROGRESS NOTE   Cc:  Ky Barban, MD  DIAGNOSIS:  Metastatic well-differentiated neuroendocrine tumor with met to liver.   PAST THERAPY: biopsy only.   CURRENT THERAPY: started on Xeloda 1000mg /m2 BID d1-14; Temodar 200mg /m2 daily d10-14; every 28 day-cycle on 05/04/2012.   INTERVAL HISTORY: Jonathan Reed 67 y.o. male returns for final discussion before starting 2nd cycle of chemo.  He tolerated the first cycle relatively well.  He had some constipation.  He denied persistent abdominal pain.  He only needs pain meds sparingly throughout the week.  He had dry skin on his feet without skin rash or desquamation.  He denies fever, anorexia, weight loss, fatigue, headache, visual changes, confusion, drenching night sweats, palpable lymph node swelling, mucositis, odynophagia, dysphagia, nausea vomiting, jaundice, chest pain, palpitation, shortness of breath, dyspnea on exertion, productive cough, gum bleeding, epistaxis, hematemesis, hemoptysis, abdominal swelling, early satiety, melena, hematochezia, hematuria, skin rash, spontaneous bleeding, joint swelling, joint pain, heat or cold intolerance, bowel bladder incontinence, back pain, focal motor weakness, paresthesia, depression.     Past Medical History  Diagnosis Date  . CHF (congestive heart failure)     with ICD (f/u with [facility])  . CVA (cerebral infarction) 2011    No residual Sx  . CAD (coronary artery disease) 2000  . HTN (hypertension)   . Carcinoid syndrome   . Metastatic malignant neuroendocrine tumor to liver     Past Surgical History  Procedure Laterality Date  . Cardiac defibrillator placement    . Inguinal hernia repair Bilateral   . Sinus surgery with instatrak    . Colonoscopy  1980    colon polyps, no f/u since.     Current Outpatient Prescriptions  Medication Sig Dispense Refill  . capecitabine (XELODA) 500 MG tablet  Take 4 tablets (2,000 mg total) by mouth 2 (two) times daily after a meal.  112 tablet  0  . metoprolol succinate (TOPROL-XL) 25 MG 24 hr tablet Take 25 mg by mouth daily.      Marland Kitchen oxyCODONE (ROXICODONE) 5 MG immediate release tablet Take 1 tablet (5 mg total) by mouth every 6 (six) hours as needed for pain.  30 tablet  0  . prochlorperazine (COMPAZINE) 10 MG tablet Take 1 tablet (10 mg total) by mouth every 6 (six) hours as needed.  30 tablet  3  . sulfamethoxazole-trimethoprim (BACTRIM DS,SEPTRA DS) 800-160 MG per tablet Take 1 tablet by mouth every Monday, Wednesday, and Friday.  30 tablet  3  . temozolomide (TEMODAR) 100 MG capsule Take 4 capsules (400 mg total) by mouth daily. May take on an empty stomach or at bedtime to decrease nausea & vomiting.  20 capsule  3  . warfarin (COUMADIN) 5 MG tablet Take as directed.  30 tablet  0  . ondansetron (ZOFRAN) 4 MG tablet Take 1 tablet (4 mg total) by mouth every 12 (twelve) hours as needed for nausea.  30 tablet  3   No current facility-administered medications for this visit.    ALLERGIES:  has No Known Allergies.  REVIEW OF SYSTEMS:  The rest of the 14-point review of system was negative.   Filed Vitals:   06/01/12 0909  BP: 151/101  Pulse: 58  Temp: 97.9 F (36.6 C)  Resp: 20   Wt Readings from Last 3 Encounters:  06/01/12 196 lb 9.6 oz (89.177 kg)  05/15/12 189 lb 14.4 oz (86.138 kg)  05/13/12 186 lb 4 oz (84.482 kg)   ECOG Performance status: 0-1  PHYSICAL EXAMINATION:   General:  well-nourished man in no acute distress.  Eyes:  no scleral icterus.  ENT:  There were no oropharyngeal lesions.  Neck was without thyromegaly.  Lymphatics:  Negative cervical, supraclavicular or axillary adenopathy.  Respiratory: lungs were clear bilaterally without wheezing or crackles.  Cardiovascular:  Regular rate and rhythm, S1/S2, without murmur, rub or gallop.  There was no pedal edema.  GI:  abdomen was soft, flat, nondistended, without clear  organomegaly.  I could not appreciate the liver edge easily today compared to last month.  There was no tender to abdominal palpation.  Musculoskeletal:  no spinal tenderness of palpation of vertebral spine.  Skin exam was without echymosis, petichae.  Neuro exam was nonfocal.  Patient was able to get on and off exam table without assistance.  Gait was normal.  Patient was alerted and oriented.  Attention was good.   Language was appropriate.  Mood was normal without depression.  Speech was not pressured.  Thought content was not tangential.      LABORATORY/RADIOLOGY DATA:  Lab Results  Component Value Date   WBC 4.4 06/01/2012   HGB 13.0 06/01/2012   HCT 38.3* 06/01/2012   PLT 165 06/01/2012   GLUCOSE 103* 06/01/2012   ALKPHOS 104 06/01/2012   ALT 18 06/01/2012   AST 17 06/01/2012   NA 141 06/01/2012   K 3.7 06/01/2012   CL 108* 06/01/2012   CREATININE 1.2 06/01/2012   BUN 13.2 06/01/2012   CO2 23 06/01/2012   INR 3.0 05/19/2012   HGBA1C 6.5* 04/12/2012     ASSESSMENT AND PLAN:   1. Hypertension: He was off of metoprolol for unknown reason.  I advised him to resume this again.  2. CHF: euvolumic on exam today. I advised him to resume Metoprolol.  3. History of Vtach: He has defibrillator/pacemaker; and is on Coumadin.  4. History of CVA: He is on Coumadin and blood pressure control.  [facility].  His INR needs to be checked frequently at least every 2 weeks while on chemo Xeloda since INR can increase with Xeloda chemo.    5.  Metastatic neuroendocrine tumor. -  Treatment:  S/p one cycle of chemo Xeloda/Temodar with no major side effects.  He seems to have clinical sign of response.  -   I recommended him to proceed with the 2nd cycle which he already started yesterday.     *  XELODA:  Take 4 of the 500mg  tablet (for total of 2,000mg ) by mouth TWICE a day.  From 05/30/12 through 06/13/12.    *  TEMODAR:  Take 4 of the 100mg  capsule (for total of 400mg ) by mouth  ONCE a day from 06/10/12 through 06/13/12.  -To decrease the risk of infection from this chemo, I advised him to continue Bactrim DS, one tablet once on Mondays, Wednesdays, and Fridays.   6.  Follow up around 06/25/12 before starting the 3rd cycle on 06/28/12.   Plan is to restage with CT scan after the 3rd cycle of chemo to ensure response.     The length of time of the face-to-face encounter was 25 minutes. More than 50% of time was spent counseling and coordination of care.

## 2012-06-01 NOTE — Progress Notes (Signed)
Anti-Coagulation Progress Note  IBAN UTZ is a 67 y.o. male who is currently on an anti-coagulation regimen.    RECENT RESULTS: Recent results are below, the most recent result is correlated with a dose of 30 mg. per week: He continues upon SMX-TMP 1DS tablet on M/W/F ONLY for prophylaxis against infectious disease(s) while on oral chemo. The hypoprothrombinemic effect of SMX-TMP seems to have declared itself just now on this 3 out of 7 days per week dosing. Will adjust dose of warfarin DOWN accordingly, OMIT todays dose and repeat INR in one week.  Lab Results  Component Value Date   INR 5.00 06/01/2012   INR 3.0 05/19/2012   INR 2.50 05/15/2012   PROTIME 30.0* 05/15/2012    ANTI-COAG DOSE: Anticoagulation Dose Instructions as of 06/01/2012     [name] Tue Wed Thu Fri Sat   New Dose 5 mg 5 mg 0 mg 2.5 mg 2.5 mg 2.5 mg 5 mg       ANTICOAG SUMMARY: Anticoagulation Episode Summary   Current INR goal 2.0-3.0  Next INR check 06/08/2012  INR from last check 5.00! (06/01/2012)  Weekly max dose   Target end date Indefinite  [facility]  Preferred lab   Send INR reminders to    Indications  CVA (cerebral infarction) [434.91] Long term (current) use of anticoagulants [V58.61]        Comments         ANTICOAG TODAY: Anticoagulation Summary as of 06/01/2012   INR goal 2.0-3.0  Selected INR 5.00! (06/01/2012)  Next INR check 06/08/2012  Target end date Indefinite   Indications  CVA (cerebral infarction) [434.91] Long term (current) use of anticoagulants [V58.61]      Anticoagulation Episode Summary   [facility]   Preferred lab    Send INR reminders to    Comments       PATIENT INSTRUCTIONS: Patient Instructions  Patient instructed to take medications as defined in the Anti-coagulation Track section of this encounter.  Patient instructed to OMIT/HOLD today's dose.  Patient verbalized understanding of these instructions.        FOLLOW-UP Return in 7 days (on 06/08/2012) for Follow up INR at 0900h.  [name], III Pharm.D., CACP

## 2012-06-01 NOTE — Patient Instructions (Addendum)
1.  Diagnosis:  Metastatic neuroendocrine tumor. 2.  Treatment:  Xeloda 1000mg /m2 BID d1-14; Temodar 200mg /m2 daily d10-14; every 28 day-cycle.   3.  2nd cycle starts today.  *  XELODA:  Take 4 of the 500mg  tablet (for total of 2,000mg ) by mouth TWICE a day.  From 05/30/12 through 06/13/12.    *  TEMODAR:  Take 4 of the 100mg  capsule (for total of 400mg ) by mouth ONCE a day from 06/10/12 through 06/13/12.   4.  Follow up around 06/25/12 before starting the 3rd cycle on 06/28/12.

## 2012-06-01 NOTE — Progress Notes (Signed)
FMLA paperwork for pt's daughter, Bonnita Nasuti,  Given to Axel Filler in [facility] Dept.

## 2012-06-01 NOTE — Patient Instructions (Signed)
Patient instructed to take medications as defined in the Anti-coagulation Track section of this encounter.  Patient instructed to OMIT/HOLD today's dose.  Patient verbalized understanding of these instructions.    

## 2012-06-01 NOTE — Telephone Encounter (Signed)
Gave pt appt for lab and ML on April 2014 °

## 2012-06-08 ENCOUNTER — Ambulatory Visit (INDEPENDENT_AMBULATORY_CARE_PROVIDER_SITE_OTHER): Payer: Self-pay | Admitting: Pharmacist

## 2012-06-08 ENCOUNTER — Ambulatory Visit (INDEPENDENT_AMBULATORY_CARE_PROVIDER_SITE_OTHER): Payer: Self-pay | Admitting: Internal Medicine

## 2012-06-08 ENCOUNTER — Encounter: Payer: Self-pay | Admitting: Internal Medicine

## 2012-06-08 VITALS — BP 125/83 | HR 60 | Temp 97.0°F | Wt 196.0 lb

## 2012-06-08 DIAGNOSIS — D72829 Elevated white blood cell count, unspecified: Secondary | ICD-10-CM

## 2012-06-08 DIAGNOSIS — Z7901 Long term (current) use of anticoagulants: Secondary | ICD-10-CM

## 2012-06-08 DIAGNOSIS — C787 Secondary malignant neoplasm of liver and intrahepatic bile duct: Secondary | ICD-10-CM

## 2012-06-08 DIAGNOSIS — I6350 Cerebral infarction due to unspecified occlusion or stenosis of unspecified cerebral artery: Secondary | ICD-10-CM

## 2012-06-08 DIAGNOSIS — I2589 Other forms of chronic ischemic heart disease: Secondary | ICD-10-CM

## 2012-06-08 DIAGNOSIS — I509 Heart failure, unspecified: Secondary | ICD-10-CM

## 2012-06-08 DIAGNOSIS — I4891 Unspecified atrial fibrillation: Secondary | ICD-10-CM

## 2012-06-08 DIAGNOSIS — C7B8 Other secondary neuroendocrine tumors: Secondary | ICD-10-CM

## 2012-06-08 DIAGNOSIS — I639 Cerebral infarction, unspecified: Secondary | ICD-10-CM

## 2012-06-08 DIAGNOSIS — R0602 Shortness of breath: Secondary | ICD-10-CM

## 2012-06-08 DIAGNOSIS — I2510 Atherosclerotic heart disease of native coronary artery without angina pectoris: Secondary | ICD-10-CM

## 2012-06-08 DIAGNOSIS — I255 Ischemic cardiomyopathy: Secondary | ICD-10-CM

## 2012-06-08 DIAGNOSIS — I10 Essential (primary) hypertension: Secondary | ICD-10-CM

## 2012-06-08 LAB — LIPID PANEL
Cholesterol: 219 mg/dL — ABNORMAL HIGH (ref 0–200)
HDL: 34 mg/dL — ABNORMAL LOW (ref >39)
LDL Cholesterol: 131 mg/dL — ABNORMAL HIGH (ref 0–99)
Total CHOL/HDL Ratio: 6.4 Ratio
Triglycerides: 272 mg/dL — ABNORMAL HIGH (ref <150)
VLDL: 54 mg/dL — ABNORMAL HIGH (ref 0–40)

## 2012-06-08 MED ORDER — METOPROLOL SUCCINATE ER 25 MG PO TB24: 25.0000 mg | ORAL_TABLET | Freq: Every day | ORAL | Status: DC

## 2012-06-08 MED ORDER — LISINOPRIL-HYDROCHLOROTHIAZIDE 20-25 MG PO TABS: 0.5000 | ORAL_TABLET | Freq: Every day | ORAL | Status: DC

## 2012-06-08 MED ORDER — WARFARIN SODIUM 5 MG PO TABS: ORAL_TABLET | ORAL | Status: DC

## 2012-06-08 NOTE — Assessment & Plan Note (Signed)
Followed at [facility]. Checking lipid panel today.

## 2012-06-08 NOTE — Assessment & Plan Note (Signed)
BP well controlled today with Lisinopril-HCTZ, and Toprol XL. Med list updated, refill for toprol sent to [facility].

## 2012-06-08 NOTE — Assessment & Plan Note (Addendum)
Followed at [facility].  -Will request records, no echo on file here.  -Per pt, ICD revision due in April.  -Continue Lisinopril-HCTZ half tablet of 20-25mg  -Continue BB, Toprol 25mg  XL

## 2012-06-08 NOTE — Assessment & Plan Note (Addendum)
He is followed by Dr. Gaylyn Rong with second cycle of chemotherapy started on 3/16 with Xeloda x14 days and Temodar on days 10-14 q 28 days.  Per last Oncology note: chemotherapy first since resection or local therapy (with cryoablation or radioactive beads) at this time are difficult due to the size of the tumor in the liver . We will repeat scan after a few months of therapy. Once the tumor is significantly smaller, surgery may consider resection at that time. * Xeloda 2,000 mg by mouth twice daily (each time, take four of the 500mg  tablet) from day 1 through day 14 of every 28-day cycle. * Temodar 400mg  by mouth once daily (each time, take four of the 100mg  capsule) from day 10 through day 14 of every 28-day cycle.  -To minimize infection from this chemo, he will continue Bactrim DS, one tablet once on Mondays, Wednesdays, and Fridays.  6. Follow up: In about 2 weeks prior to beginning next cycle of chemo.

## 2012-06-08 NOTE — Assessment & Plan Note (Signed)
Resolved with WBC of 4.4 of 06/01/12.

## 2012-06-08 NOTE — Assessment & Plan Note (Signed)
He is followed by Dr. Alexandria Lodge who will be seeing him today. He needs q2week INR check while on Xeloda.

## 2012-06-08 NOTE — Patient Instructions (Addendum)
General Instructions: -You look great today! -Follow up with your Cardiologist at [facility] as needed.  -Continue taking your medications as prescribed.  -Follow up with [place] in two months or sooner if you become sick. -Please bring all your medications with you during your next visit.  -It was great seeing you today!    Treatment Goals:  BP <140/90  Progress Toward Treatment Goals:  Treatment Goal 06/08/2012  Blood pressure at goal    Self Care Goals & Plans:  Self Care Goal 06/08/2012  Manage my medications take my medicines as prescribed; refill my medications on time  Monitor my health keep track of my blood pressure  Eat healthy foods eat foods that are low in salt; eat baked foods instead of fried foods  Be physically active find an activity I enjoy       Care Management & Community Referrals:  Referral 06/08/2012  Referrals made for care management support none needed  Referrals made to community resources -

## 2012-06-08 NOTE — Progress Notes (Signed)
  Subjective:    Patient ID: Jonathan Reed, male    DOB: Oct 05, 1945, 67 y.o.   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 He is currently undergoing his second cycle of treatment with Xeloda and Temodar. He continues to take Bactrim as well per his recall--he did not bring his medications with him today. He was seen recently by his Cardiologist at [facility] and was told that his ICD may need revision soon, in April. He was told during that visit to resume taking Lisinopril-HCTZ but half a tablet which he has been doing.  He needs refill for his warfarin and Toprol.    He had no complaints today.     Review of Systems  Constitutional: Negative for fever, chills, diaphoresis, activity change, appetite change, fatigue and unexpected weight change.  Respiratory: Negative for cough, chest tightness, shortness of breath and wheezing.   Cardiovascular: Negative for chest pain, palpitations and leg swelling.  Gastrointestinal: Negative for nausea, vomiting, abdominal pain, diarrhea, constipation and blood in stool.  Musculoskeletal: Negative for back pain.  Neurological: Negative for dizziness, syncope, weakness and headaches.  Psychiatric/Behavioral: Negative for behavioral problems and agitation.       Objective:   Physical Exam  Nursing note and vitals reviewed. Constitutional: He is oriented to person, place, and time. He appears well-developed and well-nourished. No distress.  Good mood today, smiling  Eyes: Conjunctivae are normal. Right eye exhibits no discharge. Left eye exhibits no discharge. No scleral icterus.  Cardiovascular: Normal rate and regular rhythm.   Pulmonary/Chest: Effort normal and breath sounds normal. No respiratory distress. He has no wheezes. He has no rales. He exhibits no tenderness.  Abdominal:  Soft. Bowel sounds are normal. He exhibits no distension. There is no tenderness. There is no guarding.  Musculoskeletal: He exhibits no edema and no tenderness.  Neurological: He is alert and oriented to person, place, and time.  Skin: Skin is warm and dry. No rash noted. He is not diaphoretic. No erythema. No pallor.  Psychiatric: He has a normal mood and affect. His behavior is normal. Thought content normal.          Assessment & Plan:

## 2012-06-08 NOTE — Assessment & Plan Note (Signed)
Resolved. He denies shortness of breath. 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

## 2012-06-08 NOTE — Assessment & Plan Note (Signed)
He is euvolemic on Physical exam with no SOB

## 2012-06-08 NOTE — Patient Instructions (Signed)
Patient instructed to take medications as defined in the Anti-coagulation Track section of this encounter.  Patient instructed to take today's dose.  Patient verbalized understanding of these instructions.    

## 2012-06-08 NOTE — Progress Notes (Signed)
Anti-Coagulation Progress Note  Jonathan Reed is a 67 y.o. male who is currently on an anti-coagulation regimen.    RECENT RESULTS: Recent results are below, the most recent result is correlated with a dose of 25 mg. per week: Lab Results  Component Value Date   INR 1.90 06/08/2012   INR 5.00 06/01/2012   INR 3.0 05/19/2012   PROTIME 30.0* 05/15/2012    ANTI-COAG DOSE: Anticoagulation Dose Instructions as of 06/08/2012     [name] Tue Wed Thu Fri Sat   New Dose 2.5 mg 5 mg 5 mg 5 mg 2.5 mg 5 mg 5 mg       ANTICOAG SUMMARY: Anticoagulation Episode Summary   Current INR goal 2.0-3.0  Next INR check 06/22/2012  INR from last check 1.90! (06/08/2012)  Weekly max dose   Target end date Indefinite  [facility]  Preferred lab   Send INR reminders to    Indications  CVA (cerebral infarction) [434.91] Long term (current) use of anticoagulants [V58.61]        Comments         ANTICOAG TODAY: Anticoagulation Summary as of 06/08/2012   INR goal 2.0-3.0  Selected INR 1.90! (06/08/2012)  Next INR check 06/22/2012  Target end date Indefinite   Indications  CVA (cerebral infarction) [434.91] Long term (current) use of anticoagulants [V58.61]      Anticoagulation Episode Summary   [facility]   Preferred lab    Send INR reminders to    Comments       PATIENT INSTRUCTIONS: Patient Instructions  Patient instructed to take medications as defined in the Anti-coagulation Track section of this encounter.  Patient instructed to take today's dose.  Patient verbalized understanding of these instructions.       FOLLOW-UP Return in 2 weeks (on 06/22/2012) for Follow up INR at 9AM.  [name], III Pharm.D., CACP

## 2012-06-22 ENCOUNTER — Ambulatory Visit (INDEPENDENT_AMBULATORY_CARE_PROVIDER_SITE_OTHER): Payer: Medicaid Other | Admitting: Pharmacist

## 2012-06-22 DIAGNOSIS — I6350 Cerebral infarction due to unspecified occlusion or stenosis of unspecified cerebral artery: Secondary | ICD-10-CM

## 2012-06-22 DIAGNOSIS — I639 Cerebral infarction, unspecified: Secondary | ICD-10-CM

## 2012-06-22 DIAGNOSIS — Z7901 Long term (current) use of anticoagulants: Secondary | ICD-10-CM

## 2012-06-22 NOTE — Progress Notes (Signed)
Anti-Coagulation Progress Note  Jonathan Reed is a 67 y.o. male who is currently on an anti-coagulation regimen.    RECENT RESULTS: Recent results are below, the most recent result is correlated with a dose of 30 mg. per week: Lab Results  Component Value Date   INR 3.6 06/22/2012   INR 1.90 06/08/2012   INR 5.00 06/01/2012   PROTIME 30.0* 05/15/2012    ANTI-COAG DOSE: Anticoagulation Dose Instructions as of 06/22/2012     [name] Tue Wed Thu Fri Sat   New Dose 5 mg 2.5 mg 5 mg 2.5 mg 5 mg 2.5 mg 5 mg       ANTICOAG SUMMARY: Anticoagulation Episode Summary   Current INR goal 2.0-3.0  Next INR check 07/06/2012  INR from last check 3.6! (06/22/2012)  Weekly max dose   Target end date Indefinite  [facility]  Preferred lab   Send INR reminders to    Indications  CVA (cerebral infarction) [434.91] Long term (current) use of anticoagulants [V58.61]        Comments         ANTICOAG TODAY: Anticoagulation Summary as of 06/22/2012   INR goal 2.0-3.0  Selected INR 3.6! (06/22/2012)  Next INR check 07/06/2012  Target end date Indefinite   Indications  CVA (cerebral infarction) [434.91] Long term (current) use of anticoagulants [V58.61]      Anticoagulation Episode Summary   [facility]   Preferred lab    Send INR reminders to    Comments       PATIENT INSTRUCTIONS: Patient Instructions  Patient instructed to take medications as defined in the Anti-coagulation Track section of this encounter.  Patient instructed to take today's dose.  Patient verbalized understanding of these instructions.       FOLLOW-UP Return in 2 weeks (on 07/06/2012) for Follow up INR at 2:15PM.  [name], III Pharm.D., CACP

## 2012-06-22 NOTE — Patient Instructions (Signed)
Patient instructed to take medications as defined in the Anti-coagulation Track section of this encounter.  Patient instructed to take today's dose.  Patient verbalized understanding of these instructions.    

## 2012-06-25 ENCOUNTER — Other Ambulatory Visit (HOSPITAL_BASED_OUTPATIENT_CLINIC_OR_DEPARTMENT_OTHER): Payer: Medicaid Other | Admitting: Lab

## 2012-06-25 ENCOUNTER — Telehealth: Payer: Self-pay | Admitting: Oncology

## 2012-06-25 ENCOUNTER — Encounter: Payer: Self-pay | Admitting: Oncology

## 2012-06-25 ENCOUNTER — Ambulatory Visit (HOSPITAL_BASED_OUTPATIENT_CLINIC_OR_DEPARTMENT_OTHER): Payer: Medicaid Other | Admitting: Oncology

## 2012-06-25 VITALS — BP 136/87 | HR 58 | Temp 96.8°F | Resp 18 | Ht 70.0 in | Wt 195.5 lb

## 2012-06-25 DIAGNOSIS — C7B8 Other secondary neuroendocrine tumors: Secondary | ICD-10-CM

## 2012-06-25 DIAGNOSIS — D3A00 Benign carcinoid tumor of unspecified site: Secondary | ICD-10-CM

## 2012-06-25 DIAGNOSIS — C787 Secondary malignant neoplasm of liver and intrahepatic bile duct: Secondary | ICD-10-CM

## 2012-06-25 DIAGNOSIS — C7A00 Malignant carcinoid tumor of unspecified site: Secondary | ICD-10-CM

## 2012-06-25 LAB — CBC WITH DIFFERENTIAL/PLATELET
Basophils Absolute: 0 10*3/uL (ref 0.0–0.1)
Eosinophils Absolute: 0.1 10*3/uL (ref 0.0–0.5)
HCT: 39.7 % (ref 38.4–49.9)
LYMPH%: 37.4 % (ref 14.0–49.0)
MCV: 95.2 fL (ref 79.3–98.0)
MONO%: 14.1 % — ABNORMAL HIGH (ref 0.0–14.0)
NEUT#: 2 10*3/uL (ref 1.5–6.5)
NEUT%: 45.5 % (ref 39.0–75.0)
Platelets: 187 10*3/uL (ref 140–400)
RBC: 4.17 10*6/uL — ABNORMAL LOW (ref 4.20–5.82)

## 2012-06-25 LAB — COMPREHENSIVE METABOLIC PANEL (CC13)
Alkaline Phosphatase: 73 U/L (ref 40–150)
BUN: 20.9 mg/dL (ref 7.0–26.0)
CO2: 25 mEq/L (ref 22–29)
Creatinine: 1.3 mg/dL (ref 0.7–1.3)
Glucose: 108 mg/dl — ABNORMAL HIGH (ref 70–99)
Sodium: 138 mEq/L (ref 136–145)
Total Bilirubin: 0.55 mg/dL (ref 0.20–1.20)

## 2012-06-25 MED ORDER — OXYCODONE HCL 5 MG PO TABS: 5.0000 mg | ORAL_TABLET | Freq: Four times a day (QID) | ORAL | Status: DC | PRN

## 2012-06-25 NOTE — Progress Notes (Signed)
[facility]  [phone] [phone]   OFFICE PROGRESS NOTE   Cc:  Ky Barban, MD  DIAGNOSIS:  Metastatic well-differentiated neuroendocrine tumor with met to liver.   PAST THERAPY: biopsy only.   CURRENT THERAPY: started on Xeloda 1000mg /m2 BID d1-14; Temodar 200mg /m2 daily d10-14; every 28 day-cycle on 05/04/2012.   INTERVAL HISTORY: Jonathan Reed 67 y.o. male returns for final discussion before starting 3rd cycle of chemo.  He continues to tolerate his chemo well.  He denied persistent abdominal pain.  He only needs pain meds sparingly throughout the week.  He had dry skin on his feet without skin rash or desquamation.  He denies fever, anorexia, weight loss, fatigue, headache, visual changes, confusion, drenching night sweats, palpable lymph node swelling, mucositis, odynophagia, dysphagia, nausea vomiting, jaundice, chest pain, palpitation, shortness of breath, dyspnea on exertion, productive cough, gum bleeding, epistaxis, hematemesis, hemoptysis, abdominal swelling, early satiety, melena, hematochezia, hematuria, skin rash, spontaneous bleeding, joint swelling, joint pain, heat or cold intolerance, bowel bladder incontinence, back pain, focal motor weakness, paresthesia, depression.     Past Medical History  Diagnosis Date  . CHF (congestive heart failure)     with ICD (f/u with [facility])  . CVA (cerebral infarction) 2011    No residual Sx  . CAD (coronary artery disease) 2000  . HTN (hypertension)   . Carcinoid syndrome   . Metastatic malignant neuroendocrine tumor to liver     Past Surgical History  Procedure Laterality Date  . Cardiac defibrillator placement    . Inguinal hernia repair Bilateral   . Sinus surgery with instatrak    . Colonoscopy  1980    colon polyps, no f/u since.     Current Outpatient Prescriptions  Medication Sig Dispense Refill  . capecitabine (XELODA) 500 MG tablet Take 4 tablets (2,000 mg total)  by mouth 2 (two) times daily after a meal.  112 tablet  0  . lisinopril-hydrochlorothiazide (PRINZIDE,ZESTORETIC) 20-25 MG per tablet Take 0.5 tablets by mouth daily.  90 tablet  2  . metoprolol succinate (TOPROL-XL) 25 MG 24 hr tablet Take 1 tablet (25 mg total) by mouth daily.  90 tablet  3  . ondansetron (ZOFRAN) 4 MG tablet Take 1 tablet (4 mg total) by mouth every 12 (twelve) hours as needed for nausea.  30 tablet  3  . oxyCODONE (ROXICODONE) 5 MG immediate release tablet Take 1 tablet (5 mg total) by mouth every 6 (six) hours as needed for pain.  30 tablet  0  . prochlorperazine (COMPAZINE) 10 MG tablet Take 1 tablet (10 mg total) by mouth every 6 (six) hours as needed.  30 tablet  3  . sulfamethoxazole-trimethoprim (BACTRIM DS,SEPTRA DS) 800-160 MG per tablet Take 1 tablet by mouth every Monday, Wednesday, and Friday.  30 tablet  3  . temozolomide (TEMODAR) 100 MG capsule Take 4 capsules (400 mg total) by mouth daily. May take on an empty stomach or at bedtime to decrease nausea & vomiting.  20 capsule  3  . warfarin (COUMADIN) 5 MG tablet Take as directed.  30 tablet  0   No current facility-administered medications for this visit.    ALLERGIES:  has No Known Allergies.  REVIEW OF SYSTEMS:  The rest of the 14-point review of system was negative.   Filed Vitals:   06/25/12 0906  BP: 136/87  Pulse: 58  Temp: 96.8 F (36 C)  Resp: 18   Wt Readings from Last  3 Encounters:  06/25/12 195 lb 8 oz (88.678 kg)  06/08/12 196 lb (88.905 kg)  06/01/12 196 lb 9.6 oz (89.177 kg)   ECOG Performance status: 0-1  PHYSICAL EXAMINATION:   General:  well-nourished man in no acute distress.  Eyes:  no scleral icterus.  ENT:  There were no oropharyngeal lesions.  Neck was without thyromegaly.  Lymphatics:  Negative cervical, supraclavicular or axillary adenopathy.  Respiratory: lungs were clear bilaterally without wheezing or crackles.  Cardiovascular:  Regular rate and rhythm, S1/S2, without  murmur, rub or gallop.  There was no pedal edema.  GI:  abdomen was soft, flat, nondistended, without clear organomegaly.  I could not appreciate the liver edge easily today compared to last month.  There was no tender to abdominal palpation.  Musculoskeletal:  no spinal tenderness of palpation of vertebral spine.  Skin exam was without echymosis, petichae.  Neuro exam was nonfocal.  Patient was able to get on and off exam table without assistance.  Gait was normal.  Patient was alerted and oriented.  Attention was good.   Language was appropriate.  Mood was normal without depression.  Speech was not pressured.  Thought content was not tangential.      LABORATORY/RADIOLOGY DATA:  Lab Results  Component Value Date   WBC 4.3 06/25/2012   HGB 13.2 06/25/2012   HCT 39.7 06/25/2012   PLT 187 06/25/2012   GLUCOSE 108* 06/25/2012   CHOL 219* 06/08/2012   TRIG 272* 06/08/2012   HDL 34* 06/08/2012   LDLCALC 131* 06/08/2012   ALKPHOS 73 06/25/2012   ALT 17 06/25/2012   AST 18 06/25/2012   NA 138 06/25/2012   K 4.0 06/25/2012   CL 105 06/25/2012   CREATININE 1.3 06/25/2012   BUN 20.9 06/25/2012   CO2 25 06/25/2012   INR 3.6 06/22/2012   HGBA1C 6.5* 04/12/2012     ASSESSMENT AND PLAN:   1. Hypertension: He has resumed his Metoprolol and BP is better.  2. CHF: euvolumic on exam today. 3. History of Vtach: He has defibrillator/pacemaker; and is on Coumadin.  4. History of CVA: He is on Coumadin and blood pressure control.  [facility].  His INR needs to be checked frequently at least every 2 weeks while on chemo Xeloda since INR can increase with Xeloda chemo.    5.  Metastatic neuroendocrine tumor. -  Treatment:  S/p two cycles of chemo Xeloda/Temodar with no major side effects.  He seems to have clinical sign of response.  -   I recommended him to proceed with the 3rd cycle on 06/28/12.     *  XELODA:  Take 4 of the 500mg  tablet (for total of 2,000mg ) by mouth TWICE a day.   From 05/30/12 through 06/13/12.    *  TEMODAR:  Take 4 of the 100mg  capsule (for total of 400mg ) by mouth ONCE a day from 06/10/12 through 06/13/12.  -To decrease the risk of infection from this chemo, I advised him to continue Bactrim DS, one tablet once on Mondays, Wednesdays, and Fridays.   6.  Follow up around first week of May before starting the 4th cycle on 07/26/12.  He will have a restaging CT scan prior to cycle 4.    The length of time of the face-to-face encounter was 15 minutes. More than 50% of time was spent counseling and coordination of care.

## 2012-06-25 NOTE — Telephone Encounter (Signed)
gv and printed appt schedule for pt for may....gv pt barium...pt aware cs will contact with d/t of ct

## 2012-07-02 DIAGNOSIS — I639 Cerebral infarction, unspecified: Secondary | ICD-10-CM | POA: Insufficient documentation

## 2012-07-02 DIAGNOSIS — C229 Malignant neoplasm of liver, not specified as primary or secondary: Secondary | ICD-10-CM | POA: Insufficient documentation

## 2012-07-06 ENCOUNTER — Ambulatory Visit (INDEPENDENT_AMBULATORY_CARE_PROVIDER_SITE_OTHER): Payer: Medicaid Other | Admitting: Pharmacist

## 2012-07-06 DIAGNOSIS — I639 Cerebral infarction, unspecified: Secondary | ICD-10-CM

## 2012-07-06 DIAGNOSIS — I6350 Cerebral infarction due to unspecified occlusion or stenosis of unspecified cerebral artery: Secondary | ICD-10-CM

## 2012-07-06 DIAGNOSIS — Z7901 Long term (current) use of anticoagulants: Secondary | ICD-10-CM

## 2012-07-06 NOTE — Progress Notes (Signed)
Anti-Coagulation Progress Note  Jonathan Reed is a 67 y.o. male who is currently on an anti-coagulation regimen.    RECENT RESULTS: Recent results are below, the most recent result is correlated with a dose of 27.5 mg. per week: Lab Results  Component Value Date   INR 3.00 07/06/2012   INR 3.6 06/22/2012   INR 1.90 06/08/2012   PROTIME 30.0* 05/15/2012    ANTI-COAG DOSE: Anticoagulation Dose Instructions as of 07/06/2012     [name] Tue Wed Thu Fri Sat   New Dose 2.5 mg 5 mg 2.5 mg 5 mg 2.5 mg 5 mg 2.5 mg       ANTICOAG SUMMARY: Anticoagulation Episode Summary   Current INR goal 2.0-3.0  Next INR check 07/27/2012  INR from last check 3.00 (07/06/2012)  Weekly max dose   Target end date Indefinite  [facility]  Preferred lab   Send INR reminders to    Indications  CVA (cerebral infarction) [434.91] Long term (current) use of anticoagulants [V58.61]        Comments         ANTICOAG TODAY: Anticoagulation Summary as of 07/06/2012   INR goal 2.0-3.0  Selected INR 3.00 (07/06/2012)  Next INR check 07/27/2012  Target end date Indefinite   Indications  CVA (cerebral infarction) [434.91] Long term (current) use of anticoagulants [V58.61]      Anticoagulation Episode Summary   [facility]   Preferred lab    Send INR reminders to    Comments       PATIENT INSTRUCTIONS: Patient Instructions  Patient instructed to take medications as defined in the Anti-coagulation Track section of this encounter.  Patient instructed to take today's dose.  Patient verbalized understanding of these instructions.       FOLLOW-UP Return in 3 weeks (on 07/27/2012) for Follow up INR at 2:15PM.  [name], III Pharm.D., CACP

## 2012-07-06 NOTE — Patient Instructions (Signed)
Patient instructed to take medications as defined in the Anti-coagulation Track section of this encounter.  Patient instructed to take today's dose.  Patient verbalized understanding of these instructions.    

## 2012-07-22 ENCOUNTER — Other Ambulatory Visit (HOSPITAL_BASED_OUTPATIENT_CLINIC_OR_DEPARTMENT_OTHER): Payer: Medicaid Other | Admitting: Lab

## 2012-07-22 ENCOUNTER — Ambulatory Visit (HOSPITAL_COMMUNITY)
Admission: RE | Admit: 2012-07-22 | Discharge: 2012-07-22 | Disposition: A | Discharge status: Home / Self Care | Payer: Medicaid Other | Source: Ambulatory Visit | Attending: Oncology | Admitting: Oncology

## 2012-07-22 DIAGNOSIS — R112 Nausea with vomiting, unspecified: Secondary | ICD-10-CM | POA: Insufficient documentation

## 2012-07-22 DIAGNOSIS — I77819 Aortic ectasia, unspecified site: Secondary | ICD-10-CM | POA: Insufficient documentation

## 2012-07-22 DIAGNOSIS — C787 Secondary malignant neoplasm of liver and intrahepatic bile duct: Secondary | ICD-10-CM | POA: Insufficient documentation

## 2012-07-22 DIAGNOSIS — Z79899 Other long term (current) drug therapy: Secondary | ICD-10-CM | POA: Insufficient documentation

## 2012-07-22 DIAGNOSIS — Z950 Presence of cardiac pacemaker: Secondary | ICD-10-CM | POA: Insufficient documentation

## 2012-07-22 DIAGNOSIS — C7B8 Other secondary neuroendocrine tumors: Secondary | ICD-10-CM

## 2012-07-22 DIAGNOSIS — D7389 Other diseases of spleen: Secondary | ICD-10-CM | POA: Insufficient documentation

## 2012-07-22 DIAGNOSIS — D1779 Benign lipomatous neoplasm of other sites: Secondary | ICD-10-CM | POA: Insufficient documentation

## 2012-07-22 DIAGNOSIS — I889 Nonspecific lymphadenitis, unspecified: Secondary | ICD-10-CM | POA: Insufficient documentation

## 2012-07-22 DIAGNOSIS — C7A00 Malignant carcinoid tumor of unspecified site: Secondary | ICD-10-CM | POA: Insufficient documentation

## 2012-07-22 DIAGNOSIS — I2510 Atherosclerotic heart disease of native coronary artery without angina pectoris: Secondary | ICD-10-CM | POA: Insufficient documentation

## 2012-07-22 DIAGNOSIS — K449 Diaphragmatic hernia without obstruction or gangrene: Secondary | ICD-10-CM | POA: Insufficient documentation

## 2012-07-22 DIAGNOSIS — I700 Atherosclerosis of aorta: Secondary | ICD-10-CM | POA: Insufficient documentation

## 2012-07-22 LAB — CBC WITH DIFFERENTIAL/PLATELET
EOS%: 2 % (ref 0.0–7.0)
Eosinophils Absolute: 0.1 10*3/uL (ref 0.0–0.5)
LYMPH%: 34 % (ref 14.0–49.0)
MCH: 33.6 pg — ABNORMAL HIGH (ref 27.2–33.4)
MCV: 96.9 fL (ref 79.3–98.0)
MONO%: 15.1 % — ABNORMAL HIGH (ref 0.0–14.0)
NEUT#: 2 10*3/uL (ref 1.5–6.5)
Platelets: 172 10*3/uL (ref 140–400)
RBC: 4.07 10*6/uL — ABNORMAL LOW (ref 4.20–5.82)

## 2012-07-22 LAB — COMPREHENSIVE METABOLIC PANEL (CC13)
AST: 19 U/L (ref 5–34)
Alkaline Phosphatase: 71 U/L (ref 40–150)
BUN: 19.6 mg/dL (ref 7.0–26.0)
Glucose: 91 mg/dl (ref 70–99)
Sodium: 139 mEq/L (ref 136–145)
Total Bilirubin: 0.56 mg/dL (ref 0.20–1.20)
Total Protein: 7.4 g/dL (ref 6.4–8.3)

## 2012-07-22 MED ORDER — IOHEXOL 300 MG/ML  SOLN
100.0000 mL | Freq: Once | INTRAMUSCULAR | Status: AC | PRN
  Administered 2012-07-22: 100 mL via INTRAVENOUS

## 2012-07-23 ENCOUNTER — Ambulatory Visit (HOSPITAL_BASED_OUTPATIENT_CLINIC_OR_DEPARTMENT_OTHER): Payer: Medicaid Other | Admitting: Oncology

## 2012-07-23 ENCOUNTER — Telehealth: Payer: Self-pay | Admitting: Oncology

## 2012-07-23 ENCOUNTER — Encounter: Payer: Self-pay | Admitting: Oncology

## 2012-07-23 VITALS — BP 127/85 | HR 59 | Temp 97.9°F | Resp 18 | Ht 70.0 in | Wt 195.1 lb

## 2012-07-23 DIAGNOSIS — C7B8 Other secondary neuroendocrine tumors: Secondary | ICD-10-CM

## 2012-07-23 DIAGNOSIS — C7A00 Malignant carcinoid tumor of unspecified site: Secondary | ICD-10-CM

## 2012-07-23 DIAGNOSIS — Z8673 Personal history of transient ischemic attack (TIA), and cerebral infarction without residual deficits: Secondary | ICD-10-CM

## 2012-07-23 DIAGNOSIS — I509 Heart failure, unspecified: Secondary | ICD-10-CM

## 2012-07-23 DIAGNOSIS — I639 Cerebral infarction, unspecified: Secondary | ICD-10-CM

## 2012-07-23 DIAGNOSIS — C787 Secondary malignant neoplasm of liver and intrahepatic bile duct: Secondary | ICD-10-CM

## 2012-07-23 DIAGNOSIS — Z7901 Long term (current) use of anticoagulants: Secondary | ICD-10-CM

## 2012-07-23 MED ORDER — TEMOZOLOMIDE 100 MG PO CAPS: 200.0000 mg/m2/d | ORAL_CAPSULE | Freq: Every day | ORAL | Status: DC

## 2012-07-23 MED ORDER — CAPECITABINE 500 MG PO TABS: 2000.0000 mg | ORAL_TABLET | Freq: Two times a day (BID) | ORAL | Status: DC

## 2012-07-23 NOTE — Telephone Encounter (Signed)
Gave pt appt for labs, ML and MD for June and July 2014 took referral to medical records, consult for Dr . Loyce Dys @ [facility]

## 2012-07-23 NOTE — Progress Notes (Signed)
Faxed xeloda and temodar prescription to [facility].

## 2012-07-23 NOTE — Patient Instructions (Addendum)
1. Diagnosis: Metastatic neuroendocrine tumor 2. Treatment:Xeloda 1000mg /m2 BID d1-14; Temodar 200mg /m2 daily d10-14 3. Status: Very good response according to restaging CT scan after 3 cycles 4. Recommendation: Proceed with the fourth cycle. Referral to [facility] surgical oncology program with Dr. Loyce Dys for evaluation to see if surgery is possible.

## 2012-07-23 NOTE — Progress Notes (Signed)
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   PAST THERAPY: biopsy only.   CURRENT THERAPY: started on Xeloda 1000mg /m2 BID d1-14; Temodar 200mg /m2 daily d10-14; every 28 day-cycle on 05/04/2012.   INTERVAL HISTORY: Jonathan Reed 67 y.o. male returns for regular follow up with his wife and 2 daughters.  He reports doing very well.  He has skin darkening in the hands and feet but no redness, pain, desquamation. He feels better on chemo now compared to prior to chemo 6 months ago. His abdominal pain is much better.  He only takes pain med sparingly.  He denied fever, anorexia, weight loss, fatigue, headache, visual changes, confusion, drenching night sweats, palpable lymph node swelling, mucositis, odynophagia, dysphagia, nausea vomiting, jaundice, chest pain, palpitation, shortness of breath, dyspnea on exertion, productive cough, gum bleeding, epistaxis, hematemesis, hemoptysis, abdominal swelling, early satiety, melena, hematochezia, hematuria, spontaneous bleeding, joint swelling, joint pain, heat or cold intolerance, bowel bladder incontinence, back pain, focal motor weakness, paresthesia, depression.     Past Medical History  Diagnosis Date  . CHF (congestive heart failure)     with ICD (f/u with [facility])  . CVA (cerebral infarction) 2011    No residual Sx  . CAD (coronary artery disease) 2000  . HTN (hypertension)   . Carcinoid syndrome   . Metastatic malignant neuroendocrine tumor to liver     Past Surgical History  Procedure Laterality Date  . Cardiac defibrillator placement    . Inguinal hernia repair Bilateral   . Sinus surgery with instatrak    . Colonoscopy  1980    colon polyps, no f/u since.     Current Outpatient Prescriptions  Medication Sig Dispense Refill  . capecitabine (XELODA) 500 MG  tablet Take 4 tablets (2,000 mg total) by mouth 2 (two) times daily after a meal.  112 tablet  0  . lisinopril-hydrochlorothiazide (PRINZIDE,ZESTORETIC) 20-25 MG per tablet Take 0.5 tablets by mouth daily.  90 tablet  2  . metoprolol succinate (TOPROL-XL) 25 MG 24 hr tablet Take 1 tablet (25 mg total) by mouth daily.  90 tablet  3  . ondansetron (ZOFRAN) 4 MG tablet Take 1 tablet (4 mg total) by mouth every 12 (twelve) hours as needed for nausea.  30 tablet  3  . oxyCODONE (ROXICODONE) 5 MG immediate release tablet Take 1 tablet (5 mg total) by mouth every 6 (six) hours as needed for pain.  30 tablet  0  . prochlorperazine (COMPAZINE) 10 MG tablet Take 1 tablet (10 mg total) by mouth every 6 (six) hours as needed.  30 tablet  3  . sulfamethoxazole-trimethoprim (BACTRIM DS,SEPTRA DS) 800-160 MG per tablet Take 1 tablet by mouth every Monday, Wednesday, and Friday.  30 tablet  3  . temozolomide (TEMODAR) 100 MG capsule Take 4 capsules (400 mg total) by mouth daily. May take on an empty stomach or at bedtime to decrease nausea & vomiting.  20 capsule  3  . warfarin (COUMADIN) 5 MG tablet Take as directed.  30 tablet  0   No current facility-administered medications for this visit.    ALLERGIES:  has No Known Allergies.  REVIEW OF SYSTEMS:  The rest of the 14-point review of system was negative.   Filed Vitals:   07/23/12 0838  BP: 127/85  Pulse: 59  Temp: 97.9 F (36.6  C)  Resp: 18   Wt Readings from Last 3 Encounters:  07/23/12 195 lb 1.6 oz (88.497 kg)  06/25/12 195 lb 8 oz (88.678 kg)  06/08/12 196 lb (88.905 kg)   ECOG Performance status: 0-1  PHYSICAL EXAMINATION:   General:  well-nourished man in no acute distress.  Eyes:  no scleral icterus.  ENT:  There were no oropharyngeal lesions.  Neck was without thyromegaly.  Lymphatics:  Negative cervical, supraclavicular or axillary adenopathy.  Respiratory: lungs were clear bilaterally without wheezing or crackles.  Cardiovascular:   Regular rate and rhythm, S1/S2, without murmur, rub or gallop.  There was no pedal edema.  GI:  abdomen was soft, flat, nondistended, without clear organomegaly.  I could not appreciate the liver edge easily today compared to last month.  There was no tender to abdominal palpation.  Musculoskeletal:  no spinal tenderness of palpation of vertebral spine.  Skin exam was without echymosis, petichae. There was darkening of the hands but no pain, desquamation.  Neuro exam was nonfocal.  Patient was able to get on and off exam table without assistance.  Gait was normal.  Patient was alert and oriented.  Attention was good.   Language was appropriate.  Mood was normal without depression.  Speech was not pressured.  Thought content was not tangential.     LABORATORY/RADIOLOGY DATA:  Lab Results  Component Value Date   WBC 4.1 07/22/2012   HGB 13.7 07/22/2012   HCT 39.5 07/22/2012   PLT 172 07/22/2012   GLUCOSE 91 07/22/2012   CHOL 219* 06/08/2012   TRIG 272* 06/08/2012   HDL 34* 06/08/2012   LDLCALC 131* 06/08/2012   ALKPHOS 71 07/22/2012   ALT 17 07/22/2012   AST 19 07/22/2012   NA 139 07/22/2012   K 3.9 07/22/2012   CL 104 07/22/2012   CREATININE 1.3 07/22/2012   BUN 19.6 07/22/2012   CO2 26 07/22/2012   INR 3.00 07/06/2012   HGBA1C 6.5* 04/12/2012    RADIOLOGY:  I personally reviewed the following CT and showed the images to the patient and his relatives.   CT CHEST  Findings: The chest wall is unremarkable and stable. A permanent  left-sided pacemaker is noted. No supraclavicular or axillary mass  or adenopathy. A simple appearing lipoma is noted along the  anterior margin of the latissimus dorsi muscle. The bony thorax is  intact. No destructive bone lesions or spinal canal compromise.  The heart is normal in size. No pericardial effusion. The pacer  wires are stable. Coronary artery calcifications are noted. There  is a small hiatal hernia. The esophagus is otherwise normal. The  aorta demonstrates stable mild  tortuosity, ectasia and vascular  calcifications. There are calcified mediastinal and hilar lymph  nodes. No adenopathy.  Examination of the lung parenchyma demonstrates no metastatic  pulmonary disease. No acute pulmonary findings. No pleural  effusion.  IMPRESSION:  Unremarkable chest CT. No findings for metastatic disease.  CT ABDOMEN AND PELVIS  Findings: The large dominant central hepatic mass occupying the  caudate lobe has decreased in size since the prior CT scan. It  measured 14.6 x 9.0 cm and now measures 7.3 x 6.5 cm. There are  several other smaller hepatic lesions which are better seen on this  contrast-enhanced study than on the prior noncontrast enhanced  study. The right hepatic lobe lesion at the dome measures a  maximum of 3 cm and is unchanged. The lesion in the right hepatic  lobe inferiorly near  the gallbladder previously measured a maximum  of 3.8 cm and now measures approximately 3 cm.  The spleen demonstrates stable calcified granulomas. No focal  lesion. The pancreas is normal. The adrenal glands and kidneys  are stable. There are scarring changes and probable fetal  lobulations involving both kidneys. No hydronephrosis or renal  mass.  The stomach, duodenum, small bowel and colon are unremarkable. No  inflammatory changes or mass lesions. The aorta demonstrates  stable atherosclerotic changes. There are small scattered  mesenteric and retroperitoneal lymph nodes but no mesenteric mass  or calcifications.  The bladder, prostate gland and seminal vesicles are unremarkable.  No pelvic mass or adenopathy. Stable atherosclerotic changes  involving the iliac vessels. The appendix is normal. No inguinal  mass or hernia. No adenopathy.  The bony structures are unremarkable.  IMPRESSION:  1. Significant interval decrease in size of the large central  hepatic mass.  2. Multiple smaller hepatic lesions appear stable.  3. No mesenteric or retroperitoneal mass or  adenopathy.  ASSESSMENT AND PLAN:   1. Hypertension: He is on Metoprolol.  2. CHF: euvolumic on exam today. 3. History of Vtach: He has defibrillator/pacemaker; and is on Coumadin.  4. History of CVA: He is on Coumadin and blood pressure control.  Continue close monitoring of INR while on Coumadin and Xeloda due to potential interaction to result in supratherapeutic INR.   5.  Metastatic neuroendocrine tumor. - Treatment:Xeloda 1000mg /m2 BID d1-14; Temodar 200mg /m2 daily d10-14 - Status: Very good response according to restaging CT scan after 3 cycles -  Recommendation: Proceed with the fourth cycle. Referral to [facility] surgical oncology program with Dr. Loyce Dys for evaluation to see if surgery is possible to render him cancer free and safely.  I will continue with chemo until [facility] is ready to proceed with resection if he is a candidate for such procedure.   6.  Follow up:  In about 1 month.    I informed the patient and his relatives that I am leaving the practice mid July 2014.  [facility] will [facility] for him to follow up with a new provider upon my departure.    The length of time of the face-to-face encounter was 25 minutes. More than 50% of time was spent counseling and coordination of care.

## 2012-07-23 NOTE — Telephone Encounter (Signed)
Tiffany aware of referral to [facility] for Dr. Fabienne Bruns

## 2012-07-27 ENCOUNTER — Ambulatory Visit: Payer: Medicaid Other

## 2012-07-28 ENCOUNTER — Telehealth: Payer: Self-pay | Admitting: Oncology

## 2012-07-28 NOTE — Telephone Encounter (Signed)
Pt has a appt. With Dr. Loyce Dys @ [facility] on 08/04/12@12 :30. Faxed medical records.  Slides and scans will be fedex'ed. Dr.  Susann Givens office will call pt with appt.

## 2012-08-07 ENCOUNTER — Telehealth: Payer: Self-pay | Admitting: *Deleted

## 2012-08-07 ENCOUNTER — Ambulatory Visit (INDEPENDENT_AMBULATORY_CARE_PROVIDER_SITE_OTHER): Payer: Medicaid Other | Admitting: Pharmacist

## 2012-08-07 DIAGNOSIS — I6350 Cerebral infarction due to unspecified occlusion or stenosis of unspecified cerebral artery: Secondary | ICD-10-CM

## 2012-08-07 DIAGNOSIS — I639 Cerebral infarction, unspecified: Secondary | ICD-10-CM

## 2012-08-07 DIAGNOSIS — Z7901 Long term (current) use of anticoagulants: Secondary | ICD-10-CM

## 2012-08-07 LAB — POCT INR: INR: 1.5

## 2012-08-07 NOTE — Progress Notes (Signed)
Anti-Coagulation Progress Note  GARDNER SERVANTES is a 67 y.o. male who is currently on an anti-coagulation regimen.    RECENT RESULTS: Recent results are below, the most recent result is correlated with a dose of 25 mg. per week--but with interruption/hold for proceduring. Only back on warfarin x 6 days. Lab Results  Component Value Date   INR 1.5 08/07/2012   INR 3.00 07/06/2012   INR 3.6 06/22/2012   PROTIME 30.0* 05/15/2012    ANTI-COAG DOSE: Anticoagulation Dose Instructions as of 08/07/2012     [name] Tue Wed Thu Fri Sat   New Dose 2.5 mg 5 mg 2.5 mg 5 mg 2.5 mg 5 mg 5 mg       ANTICOAG SUMMARY: Anticoagulation Episode Summary   Current INR goal 2.0-3.0  Next INR check 08/17/2012  INR from last check 1.5! (08/07/2012)  Weekly max dose   Target end date Indefinite  [facility]  Preferred lab   Send INR reminders to    Indications  CVA (cerebral infarction) [434.91] Long term (current) use of anticoagulants [V58.61]        Comments         ANTICOAG TODAY: Anticoagulation Summary as of 08/07/2012   INR goal 2.0-3.0  Selected INR 1.5! (08/07/2012)  Next INR check 08/17/2012  Target end date Indefinite   Indications  CVA (cerebral infarction) [434.91] Long term (current) use of anticoagulants [V58.61]      Anticoagulation Episode Summary   [facility]   Preferred lab    Send INR reminders to    Comments       PATIENT INSTRUCTIONS: Patient Instructions  Patient instructed to take medications as defined in the Anti-coagulation Track section of this encounter.  Patient instructed to take today's dose.  Patient verbalized understanding of these instructions.       FOLLOW-UP Return in 10 days (on 08/17/2012) for Follow up INR at 1115h.  [name], III Pharm.D., CACP

## 2012-08-07 NOTE — Patient Instructions (Signed)
Patient instructed to take medications as defined in the Anti-coagulation Track section of this encounter.  Patient instructed to take today's dose.  Patient verbalized understanding of these instructions.    

## 2012-08-07 NOTE — Telephone Encounter (Signed)
VM from Dr. Susann Givens office at [facility]. Pt did not show for scheduled appt on 08/04/12.  They will be happy to r/s if needed.

## 2012-08-07 NOTE — Telephone Encounter (Signed)
Called pt's home and pt self answered.  He states he did not make it to appt at [facility] because he ended up in hospital at [facility] to have his battery in his pacemaker replaced.  I gave him the number to Dr. Susann Givens office and he said he will call to r/s the appt himself.  He verbalized understanding of importance of making this appt.

## 2012-08-07 NOTE — Telephone Encounter (Signed)
Please call pt and talk with him and his wife.  It is very important to keep this appointments.  They can call Dr. Susann Givens office themselves to make appointment.  That way, they will show up.  Thanks.

## 2012-08-17 ENCOUNTER — Ambulatory Visit (INDEPENDENT_AMBULATORY_CARE_PROVIDER_SITE_OTHER): Payer: Medicaid Other | Admitting: Pharmacist

## 2012-08-17 DIAGNOSIS — Z7901 Long term (current) use of anticoagulants: Secondary | ICD-10-CM

## 2012-08-17 DIAGNOSIS — I6350 Cerebral infarction due to unspecified occlusion or stenosis of unspecified cerebral artery: Secondary | ICD-10-CM

## 2012-08-17 DIAGNOSIS — I639 Cerebral infarction, unspecified: Secondary | ICD-10-CM

## 2012-08-17 LAB — POCT INR: INR: 3.1

## 2012-08-17 NOTE — Patient Instructions (Signed)
Patient instructed to take medications as defined in the Anti-coagulation Track section of this encounter.  Patient instructed to take today's dose.  Patient verbalized understanding of these instructions.    

## 2012-08-17 NOTE — Progress Notes (Signed)
Anti-Coagulation Progress Note  Jonathan Reed is a 67 y.o. male who is currently on an anti-coagulation regimen.    RECENT RESULTS: Recent results are below, the most recent result is correlated with a dose of 27.5 mg. per week: Lab Results  Component Value Date   INR 3.10 08/17/2012   INR 1.5 08/07/2012   INR 3.00 07/06/2012   PROTIME 30.0* 05/15/2012    ANTI-COAG DOSE: Anticoagulation Dose Instructions as of 08/17/2012     [name] Tue Wed Thu Fri Sat   New Dose 2.5 mg 2.5 mg 2.5 mg 5 mg 2.5 mg 5 mg 2.5 mg       ANTICOAG SUMMARY: Anticoagulation Episode Summary   Current INR goal 2.0-3.0  Next INR check 09/07/2012  INR from last check 3.10! (08/17/2012)  Weekly max dose   Target end date Indefinite  [facility]  Preferred lab   Send INR reminders to    Indications  CVA (cerebral infarction) [434.91] Long term (current) use of anticoagulants [V58.61]        Comments         ANTICOAG TODAY: Anticoagulation Summary as of 08/17/2012   INR goal 2.0-3.0  Selected INR 3.10! (08/17/2012)  Next INR check 09/07/2012  Target end date Indefinite   Indications  CVA (cerebral infarction) [434.91] Long term (current) use of anticoagulants [V58.61]      Anticoagulation Episode Summary   [facility]   Preferred lab    Send INR reminders to    Comments       PATIENT INSTRUCTIONS: Patient Instructions  Patient instructed to take medications as defined in the Anti-coagulation Track section of this encounter.  Patient instructed to take today's dose.  Patient verbalized understanding of these instructions.       FOLLOW-UP Return in 3 weeks (on 09/07/2012) for Follow up INR at 2:15PM.  [name], III Pharm.D., CACP

## 2012-08-18 ENCOUNTER — Telehealth: Payer: Self-pay | Admitting: *Deleted

## 2012-08-18 ENCOUNTER — Other Ambulatory Visit: Payer: Self-pay | Admitting: Oncology

## 2012-08-18 NOTE — Telephone Encounter (Signed)
He should go ahead with the next cycle on 6/9

## 2012-08-18 NOTE — Telephone Encounter (Signed)
Daughter called to report pt has appt at [facility] on 6/10 and is also due to start next cycle of Xeloda on 6/09.  Should he restart next cycle on 6/09 or wait until after his appt at [facility]?

## 2012-08-18 NOTE — Telephone Encounter (Signed)
Instructed wife that pt should start next cycle of Xeloda when due on 6/09 per Dr. Gaylyn Rong.  Also confirmed his appt here on 6/09.  She verbalized understanding.

## 2012-08-19 ENCOUNTER — Other Ambulatory Visit: Payer: Self-pay | Admitting: Internal Medicine

## 2012-08-19 DIAGNOSIS — I639 Cerebral infarction, unspecified: Secondary | ICD-10-CM

## 2012-08-24 ENCOUNTER — Ambulatory Visit (HOSPITAL_BASED_OUTPATIENT_CLINIC_OR_DEPARTMENT_OTHER): Payer: Medicaid Other | Admitting: Oncology

## 2012-08-24 ENCOUNTER — Encounter: Payer: Self-pay | Admitting: Oncology

## 2012-08-24 ENCOUNTER — Other Ambulatory Visit (HOSPITAL_BASED_OUTPATIENT_CLINIC_OR_DEPARTMENT_OTHER): Payer: Medicaid Other | Admitting: Lab

## 2012-08-24 VITALS — BP 134/87 | HR 54 | Temp 98.0°F | Resp 18 | Ht 70.0 in | Wt 194.9 lb

## 2012-08-24 DIAGNOSIS — C787 Secondary malignant neoplasm of liver and intrahepatic bile duct: Secondary | ICD-10-CM

## 2012-08-24 DIAGNOSIS — C7B8 Other secondary neuroendocrine tumors: Secondary | ICD-10-CM

## 2012-08-24 DIAGNOSIS — C7A00 Malignant carcinoid tumor of unspecified site: Secondary | ICD-10-CM

## 2012-08-24 LAB — CBC WITH DIFFERENTIAL/PLATELET
BASO%: 0 % (ref 0.0–2.0)
Basophils Absolute: 0 10*3/uL (ref 0.0–0.1)
EOS%: 1.8 % (ref 0.0–7.0)
HGB: 13.8 g/dL (ref 13.0–17.1)
MCH: 34 pg — ABNORMAL HIGH (ref 27.2–33.4)
RBC: 4.06 10*6/uL — ABNORMAL LOW (ref 4.20–5.82)
RDW: 17.8 % — ABNORMAL HIGH (ref 11.0–14.6)
lymph#: 1.6 10*3/uL (ref 0.9–3.3)
nRBC: 0 % (ref 0–0)

## 2012-08-24 LAB — COMPREHENSIVE METABOLIC PANEL (CC13)
ALT: 16 U/L (ref 0–55)
AST: 17 U/L (ref 5–34)
Albumin: 3.6 g/dL (ref 3.5–5.0)
Calcium: 9.7 mg/dL (ref 8.4–10.4)
Chloride: 105 mEq/L (ref 98–107)
Potassium: 3.8 mEq/L (ref 3.5–5.1)
Total Protein: 7.3 g/dL (ref 6.4–8.3)

## 2012-08-24 NOTE — Progress Notes (Signed)
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   PAST THERAPY: biopsy only.   CURRENT THERAPY: started on Xeloda 1000mg /m2 BID d1-14; Temodar 200mg /m2 daily d10-14; every 28 day-cycle on 05/04/2012.   INTERVAL HISTORY: Jonathan Reed 67 y.o. male returns for regular follow up with his wife and daughter.  He reports doing very well.  He has skin darkening in the hands and feet but no redness, pain, desquamation. He feels better on chemo now compared to prior to chemo 6 months ago. His abdominal pain is much better.  He only takes pain med sparingly.  He denied fever, anorexia, weight loss, fatigue, headache, visual changes, confusion, drenching night sweats, palpable lymph node swelling, mucositis, odynophagia, dysphagia, nausea vomiting, jaundice, chest pain, palpitation, shortness of breath, dyspnea on exertion, productive cough, gum bleeding, epistaxis, hematemesis, hemoptysis, abdominal swelling, early satiety, melena, hematochezia, hematuria, spontaneous bleeding, joint swelling, joint pain, heat or cold intolerance, bowel bladder incontinence, back pain, focal motor weakness, paresthesia, depression.     Past Medical History  Diagnosis Date  . CHF (congestive heart failure)     with ICD (f/u with [facility])  . CVA (cerebral infarction) 2011    No residual Sx  . CAD (coronary artery disease) 2000  . HTN (hypertension)   . Carcinoid syndrome   . Metastatic malignant neuroendocrine tumor to liver     Past Surgical History  Procedure Laterality Date  . Cardiac defibrillator placement    . Inguinal hernia repair Bilateral   . Sinus surgery with instatrak    . Colonoscopy  1980    colon polyps, no f/u since.     Current Outpatient Prescriptions  Medication Sig Dispense Refill  . lisinopril-hydrochlorothiazide  (PRINZIDE,ZESTORETIC) 20-25 MG per tablet Take 0.5 tablets by mouth daily.  90 tablet  2  . metoprolol succinate (TOPROL-XL) 25 MG 24 hr tablet Take 1 tablet (25 mg total) by mouth daily.  90 tablet  3  . ondansetron (ZOFRAN) 4 MG tablet Take 1 tablet (4 mg total) by mouth every 12 (twelve) hours as needed for nausea.  30 tablet  3  . oxyCODONE (ROXICODONE) 5 MG immediate release tablet Take 1 tablet (5 mg total) by mouth every 6 (six) hours as needed for pain.  30 tablet  0  . prochlorperazine (COMPAZINE) 10 MG tablet Take 1 tablet (10 mg total) by mouth every 6 (six) hours as needed.  30 tablet  3  . sulfamethoxazole-trimethoprim (BACTRIM DS,SEPTRA DS) 800-160 MG per tablet Take 1 tablet by mouth every Monday, Wednesday, and Friday.  30 tablet  3  . temozolomide (TEMODAR) 100 MG capsule Take 4 capsules (400 mg total) by mouth daily. May take on an empty stomach or at bedtime to decrease nausea & vomiting.  20 capsule  3  . warfarin (COUMADIN) 5 MG tablet TAKE 1 TABLET BY MOUTH AS DIRECTED  30 tablet  12  . XELODA 500 MG tablet TAKE 4 TABLETS BY MOUTH TWO TIMES DAILY AFTER A MEAL (TAKE ON DAYS 1-14 OF EACH 28 DAY CHEMO CYCLE)  112 tablet  0   No current facility-administered medications for this visit.    ALLERGIES:  has No Known Allergies.  REVIEW OF SYSTEMS:  The rest of the 14-point review of system was negative.   Filed Vitals:   08/24/12 0911  BP: 134/87  Pulse: 54  Temp: 98 F (36.7 C)  Resp: 18   Wt Readings from Last 3 Encounters:  08/24/12 194 lb 14.4 oz (88.406 kg)  07/23/12 195 lb 1.6 oz (88.497 kg)  06/25/12 195 lb 8 oz (88.678 kg)   ECOG Performance status: 0-1  PHYSICAL EXAMINATION:   General:  well-nourished man in no acute distress.  Eyes:  no scleral icterus.  ENT:  There were no oropharyngeal lesions.  Neck was without thyromegaly.  Lymphatics:  Negative cervical, supraclavicular or axillary adenopathy.  Respiratory: lungs were clear bilaterally without wheezing  or crackles.  Cardiovascular:  Regular rate and rhythm, S1/S2, without murmur, rub or gallop.  There was no pedal edema.  GI:  abdomen was soft, flat, nondistended, without clear organomegaly.  I could not appreciate the liver edge easily today compared to last month.  There was no tender to abdominal palpation.  Musculoskeletal:  no spinal tenderness of palpation of vertebral spine.  Skin exam was without echymosis, petichae. There was darkening of the hands but no pain, desquamation.  Neuro exam was nonfocal.  Patient was able to get on and off exam table without assistance.  Gait was normal.  Patient was alert and oriented.  Attention was good.   Language was appropriate.  Mood was normal without depression.  Speech was not pressured.  Thought content was not tangential.     LABORATORY/RADIOLOGY DATA:  Lab Results  Component Value Date   WBC 3.9* 08/24/2012   HGB 13.8 08/24/2012   HCT 39.9 08/24/2012   PLT 132 confirmed* 08/24/2012   GLUCOSE 100* 08/24/2012   CHOL 219* 06/08/2012   TRIG 272* 06/08/2012   HDL 34* 06/08/2012   LDLCALC 131* 06/08/2012   ALKPHOS 69 08/24/2012   ALT 16 08/24/2012   AST 17 08/24/2012   NA 139 08/24/2012   K 3.8 08/24/2012   CL 105 08/24/2012   CREATININE 1.4* 08/24/2012   BUN 17.7 08/24/2012   CO2 24 08/24/2012   INR 3.10 08/17/2012   HGBA1C 6.5* 04/12/2012    ASSESSMENT AND PLAN:   1. Hypertension: He is on Metoprolol.  2. CHF: euvolumic on exam today. 3. History of Vtach: He has defibrillator/pacemaker; and is on Coumadin.  4. History of CVA: He is on Coumadin and blood pressure control.  Continue close monitoring of INR while on Coumadin and Xeloda due to potential interaction to result in supratherapeutic INR.   5.  Metastatic neuroendocrine tumor. - Treatment:Xeloda 1000mg /m2 BID d1-14; Temodar 200mg /m2 daily d10-14 - Status: Very good response according to restaging CT scan after 3 cycles -  Recommendation: Proceed with the fifth cycle. He has a referral to [facility] surgical  oncology program with Dr. Loyce Dys for evaluation to see if surgery is possible to render him cancer free and safely.  He will see them on 08/25/12. I will continue with chemo until [facility] is ready to proceed with resection if he is a candidate for such procedure.   6.  Follow up:  In about 1 month.    The length of time of the face-to-face encounter was 15 minutes. More than 50% of time was spent counseling and coordination of care.

## 2012-08-27 DIAGNOSIS — D3A8 Other benign neuroendocrine tumors: Secondary | ICD-10-CM | POA: Insufficient documentation

## 2012-08-31 ENCOUNTER — Other Ambulatory Visit: Payer: Self-pay | Admitting: Oncology

## 2012-09-04 ENCOUNTER — Telehealth: Payer: Self-pay

## 2012-09-04 NOTE — Telephone Encounter (Signed)
Message copied by Kallie Locks on Fri Sep 04, 2012 12:33 PM ------      Message from: Jethro Bolus T      Created: Fri Sep 04, 2012 10:15 AM      Regarding: RE: "Generic brand"      Contact: [phone]       OK to start Temodar late.              ----- Message -----         From: Marcell Barlow, RN         Sent: 09/04/2012  10:04 AM           To: Exie Parody, MD      Subject: "Generic brand"                                          Patient received generic brand of Temodar and is experiencing increase abdominal pains.  Patient called on-call physician last night  to report and was told not to take last night dosage.  Took med back to [facility] pharmacy this morning to exchange un -opened meds and was told that med would not be available until Monday.  Cycle was to end on this Sunday, is it okay for patient to restart cycle on Monday after skipping 3 days?       ------

## 2012-09-07 ENCOUNTER — Ambulatory Visit (INDEPENDENT_AMBULATORY_CARE_PROVIDER_SITE_OTHER): Payer: Medicaid Other | Admitting: Pharmacist

## 2012-09-07 DIAGNOSIS — I639 Cerebral infarction, unspecified: Secondary | ICD-10-CM

## 2012-09-07 DIAGNOSIS — I6350 Cerebral infarction due to unspecified occlusion or stenosis of unspecified cerebral artery: Secondary | ICD-10-CM

## 2012-09-07 DIAGNOSIS — Z7901 Long term (current) use of anticoagulants: Secondary | ICD-10-CM

## 2012-09-07 NOTE — Patient Instructions (Signed)
Patient instructed to take medications as defined in the Anti-coagulation Track section of this encounter.  Patient instructed to take today's dose.  Patient verbalized understanding of these instructions.    

## 2012-09-07 NOTE — Progress Notes (Signed)
Anti-Coagulation Progress Note  Jonathan Reed is a 67 y.o. male who is currently on an anti-coagulation regimen.    RECENT RESULTS: Recent results are below, the most recent result is correlated with a dose of 22.5 mg. per week: Lab Results  Component Value Date   INR 2.80 09/07/2012   INR 3.10 08/17/2012   INR 1.5 08/07/2012   PROTIME 30.0* 05/15/2012    ANTI-COAG DOSE: Anticoagulation Dose Instructions as of 09/07/2012     [name] Tue Wed Thu Fri Sat   New Dose 2.5 mg 2.5 mg 2.5 mg 5 mg 2.5 mg 5 mg 2.5 mg       ANTICOAG SUMMARY: Anticoagulation Episode Summary   Current INR goal 2.0-3.0  Next INR check 09/28/2012  INR from last check 2.80 (09/07/2012)  Weekly max dose   Target end date Indefinite  [facility]  Preferred lab   Send INR reminders to    Indications  CVA (cerebral infarction) [434.91] Long term (current) use of anticoagulants [V58.61]        Comments         ANTICOAG TODAY: Anticoagulation Summary as of 09/07/2012   INR goal 2.0-3.0  Selected INR 2.80 (09/07/2012)  Next INR check 09/28/2012  Target end date Indefinite   Indications  CVA (cerebral infarction) [434.91] Long term (current) use of anticoagulants [V58.61]      Anticoagulation Episode Summary   [facility]   Preferred lab    Send INR reminders to    Comments       PATIENT INSTRUCTIONS: Patient Instructions  Patient instructed to take medications as defined in the Anti-coagulation Track section of this encounter.  Patient instructed to take today's dose.  Patient verbalized understanding of these instructions.       FOLLOW-UP Return in 3 weeks (on 09/28/2012) for Follow up INR at 2:15PM.  [name], III Pharm.D., CACP

## 2012-09-08 NOTE — Progress Notes (Signed)
I have reviewed Dr. Groce's note and agree with his plan.  

## 2012-09-17 LAB — PROTIME-INR

## 2012-09-23 ENCOUNTER — Other Ambulatory Visit (HOSPITAL_BASED_OUTPATIENT_CLINIC_OR_DEPARTMENT_OTHER): Payer: Medicaid Other | Admitting: Lab

## 2012-09-23 ENCOUNTER — Telehealth: Payer: Self-pay | Admitting: Oncology

## 2012-09-23 ENCOUNTER — Ambulatory Visit (HOSPITAL_BASED_OUTPATIENT_CLINIC_OR_DEPARTMENT_OTHER): Payer: Medicaid Other | Admitting: Oncology

## 2012-09-23 VITALS — BP 130/88 | HR 56 | Temp 97.5°F | Resp 18 | Ht 70.0 in | Wt 201.1 lb

## 2012-09-23 DIAGNOSIS — C7B8 Other secondary neuroendocrine tumors: Secondary | ICD-10-CM

## 2012-09-23 DIAGNOSIS — C7A00 Malignant carcinoid tumor of unspecified site: Secondary | ICD-10-CM

## 2012-09-23 DIAGNOSIS — C787 Secondary malignant neoplasm of liver and intrahepatic bile duct: Secondary | ICD-10-CM

## 2012-09-23 LAB — CBC WITH DIFFERENTIAL/PLATELET
Basophils Absolute: 0 10*3/uL (ref 0.0–0.1)
Eosinophils Absolute: 0.1 10*3/uL (ref 0.0–0.5)
HGB: 13.9 g/dL (ref 13.0–17.1)
LYMPH%: 34 % (ref 14.0–49.0)
MCV: 101.5 fL — ABNORMAL HIGH (ref 79.3–98.0)
MONO%: 14.2 % — ABNORMAL HIGH (ref 0.0–14.0)
NEUT#: 2.1 10*3/uL (ref 1.5–6.5)
Platelets: 138 10*3/uL — ABNORMAL LOW (ref 140–400)
RDW: 18.4 % — ABNORMAL HIGH (ref 11.0–14.6)

## 2012-09-23 LAB — COMPREHENSIVE METABOLIC PANEL (CC13)
Albumin: 3.6 g/dL (ref 3.5–5.0)
Alkaline Phosphatase: 70 U/L (ref 40–150)
BUN: 17.4 mg/dL (ref 7.0–26.0)
Glucose: 104 mg/dl (ref 70–140)
Potassium: 3.7 mEq/L (ref 3.5–5.1)

## 2012-09-23 MED ORDER — CAPECITABINE 500 MG PO TABS: ORAL_TABLET | ORAL | Status: DC

## 2012-09-23 MED ORDER — TEMOZOLOMIDE 100 MG PO CAPS: 200.0000 mg/m2/d | ORAL_CAPSULE | Freq: Every day | ORAL | Status: DC

## 2012-09-23 NOTE — Progress Notes (Signed)
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   PAST THERAPY: biopsy only.   CURRENT THERAPY: started on Xeloda 1000mg /m2 BID d1-14; Temodar 200mg /m2 daily d10-14; every 28 day-cycle on 05/04/2012.   INTERVAL HISTORY: Jonathan Reed 67 y.o. male returns for regular follow up with his wife and 2 daughters.  He was seen at [facility] by Dr. Loyce Dys and was deemed not to be a candidate for resection of hepatic met.  Dr. Reginia Naas from [facility] agreed with continuing chemo as long as patient is able to tolerate.  He has been on chemo with darkening of his hands and genitalia.  He had generic Temodar last month and had nausea/vomiting.  He also developed sore inside the foreskin. The sore improved rapidly with lubrication and has not had problem again today. He denied wearing tight pants or underwear.  He denied penile discharge, bleeding, or testicular pain.  He otherwise tolerates chemo very well.  He is still working full time at a [employer] ([employer]). His weight is increasing.  He denied any abdominal pain which he had when he was first diagnosed.    Patient denies fever, anorexia, weight loss, fatigue, headache, visual changes, confusion, drenching night sweats, palpable lymph node swelling, mucositis, odynophagia, dysphagia, nausea vomiting, jaundice, chest pain, palpitation, shortness of breath, dyspnea on exertion, productive cough, gum bleeding, epistaxis, hematemesis, hemoptysis, abdominal pain, abdominal swelling, early satiety, melena, hematochezia, spontaneous bleeding, joint swelling, joint pain, heat or cold intolerance, bowel bladder incontinence, back pain, focal motor weakness, paresthesia, depression.     Past Medical History  Diagnosis Date  . CHF (congestive heart failure)     with ICD (f/u with  [facility])  . CVA (cerebral infarction) 2011    No residual Sx  . CAD (coronary artery disease) 2000  . HTN (hypertension)   . Carcinoid syndrome   . Metastatic malignant neuroendocrine tumor to liver     Past Surgical History  Procedure Laterality Date  . Cardiac defibrillator placement    . Inguinal hernia repair Bilateral   . Sinus surgery with instatrak    . Colonoscopy  1980    colon polyps, no f/u since.     Current Outpatient Prescriptions  Medication Sig Dispense Refill  . capecitabine (XELODA) 500 MG tablet TAKE 4 TABLETS BY MOUTH TWO TIMES DAILY AFTER A MEAL (TAKE ON DAYS 1-14 OF EACH 28 DAY CHEMO CYCLE)  112 tablet  0  . lisinopril-hydrochlorothiazide (PRINZIDE,ZESTORETIC) 20-25 MG per tablet Take 0.5 tablets by mouth daily.  90 tablet  2  . metoprolol succinate (TOPROL-XL) 25 MG 24 hr tablet Take 1 tablet (25 mg total) by mouth daily.  90 tablet  3  . ondansetron (ZOFRAN) 4 MG tablet Take 1 tablet (4 mg total) by mouth every 12 (twelve) hours as needed for nausea.  30 tablet  3  . oxyCODONE (ROXICODONE) 5 MG immediate release tablet Take 1 tablet (5 mg total) by mouth every 6 (six) hours as needed for pain.  30 tablet  0  . prochlorperazine (COMPAZINE) 10 MG tablet Take 1 tablet (10 mg total) by mouth every 6 (six) hours as needed.  30 tablet  3  . spironolactone (ALDACTONE) 25 MG tablet Take 25 mg by mouth daily.      Marland Kitchen sulfamethoxazole-trimethoprim (BACTRIM DS,SEPTRA DS) 800-160 MG per  tablet Take 1 tablet by mouth every Monday, Wednesday, and Friday.  30 tablet  3  . temozolomide (TEMODAR) 100 MG capsule Take 4 capsules (400 mg total) by mouth daily. May take on an empty stomach or at bedtime to decrease nausea & vomiting.  20 capsule  3  . warfarin (COUMADIN) 5 MG tablet TAKE 1 TABLET BY MOUTH AS DIRECTED  30 tablet  12   No current facility-administered medications for this visit.    ALLERGIES:  has No Known Allergies.  REVIEW OF SYSTEMS:  The rest of the 14-point  review of system was negative.   Filed Vitals:   09/23/12 0851  BP: 130/88  Pulse: 56  Temp: 97.5 F (36.4 C)  Resp: 18   Wt Readings from Last 3 Encounters:  09/23/12 201 lb 1.6 oz (91.218 kg)  08/24/12 194 lb 14.4 oz (88.406 kg)  07/23/12 195 lb 1.6 oz (88.497 kg)   ECOG Performance status: 0  PHYSICAL EXAMINATION:   General:  well-nourished man in no acute distress.  Eyes:  no scleral icterus.  ENT:  There were no oropharyngeal lesions.  Neck was without thyromegaly.  Lymphatics:  Negative cervical, supraclavicular or axillary adenopathy.  Respiratory: lungs were clear bilaterally without wheezing or crackles.  Cardiovascular:  Regular rate and rhythm, S1/S2, without murmur, rub or gallop.  There was no pedal edema.  GI:  abdomen was soft, flat, nondistended.  There was no organomegaly. There was no tender to abdominal palpation. GU exam:  No penile discharge, bleeding, ulceration even after retraction of foreskin.  There was no testicular pain.  Musculoskeletal:  no spinal tenderness of palpation of vertebral spine.  Skin exam was without echymosis, petichae. There was darkening of the hands but no pain, desquamation.  Neuro exam was nonfocal.  Patient was able to get on and off exam table without assistance.  Gait was normal.  Patient was alert and oriented.  Attention was good.   Language was appropriate.  Mood was normal without depression.  Speech was not pressured.  Thought content was not tangential.     LABORATORY/RADIOLOGY DATA:  Lab Results  Component Value Date   WBC 4.2 09/23/2012   HGB 13.9 09/23/2012   HCT 39.6 09/23/2012   PLT 138* 09/23/2012   GLUCOSE 100* 08/24/2012   CHOL 219* 06/08/2012   TRIG 272* 06/08/2012   HDL 34* 06/08/2012   LDLCALC 131* 06/08/2012   ALKPHOS 69 08/24/2012   ALT 16 08/24/2012   AST 17 08/24/2012   NA 139 08/24/2012   K 3.8 08/24/2012   CL 105 08/24/2012   CREATININE 1.4* 08/24/2012   BUN 17.7 08/24/2012   CO2 24 08/24/2012   INR 2.80 09/07/2012   HGBA1C 6.5*  04/12/2012    ASSESSMENT AND PLAN:   1. Hypertension: He is on Metoprolol.  2. CHF: euvolumic on exam today. 3. History of Vtach: He has defibrillator/pacemaker; and is on Coumadin. He just had it exchanged recently at [facility].  4. History of CVA: He is on Coumadin and blood pressure control.   5.  Metastatic neuroendocrine tumor.  - Treatment:Xeloda 1000mg /m2 BID d1-14; Temodar 200mg /m2 daily d10-14 - He has had very good partial resonse after 3 cycles.  He was seen by Surg Onc (Dr. Loyce Dys) and Med Onc (Dr. Reginia Naas) at [facility] who deemed that he was not a resectable candidate.  Mr. Lattie Corns agreed with continuing the current regimen for a few more months as long as patient is having response and  no dose limiting toxicity.  When he stops chemo, option will be Sandostatin and/or Y-90 treatment.  - My recommendation:  Proceed with chemo this month without dose modification.  I advised him to continue with lubrication with his penile gland if there is irritation.  However, on my exam today, there was no sign of infection or ulceration to dose reduce.  Plan for restaging in the next few months to ascertain the response in the liver mass and treatment options at the time.  He does not want to be on chemo too long even though he has achieved very good partial response and he has no dose limiting toxicity.   6.  Follow up:  In about 1 month.    I informed the patient and his relatives that I am leaving the practice next week.  [facility] for [facility] to follow up with a new provider.        The length of time of the face-to-face encounter was 25 minutes. More than 50% of time was spent counseling and coordination of care.

## 2012-09-23 NOTE — Telephone Encounter (Signed)
gv and printed appt sched and avs for pt  °

## 2012-09-28 ENCOUNTER — Telehealth: Payer: Self-pay | Admitting: Pharmacist

## 2012-09-28 ENCOUNTER — Ambulatory Visit (INDEPENDENT_AMBULATORY_CARE_PROVIDER_SITE_OTHER): Payer: Medicaid Other | Admitting: Pharmacist

## 2012-09-28 DIAGNOSIS — I639 Cerebral infarction, unspecified: Secondary | ICD-10-CM

## 2012-09-28 DIAGNOSIS — I6350 Cerebral infarction due to unspecified occlusion or stenosis of unspecified cerebral artery: Secondary | ICD-10-CM

## 2012-09-28 DIAGNOSIS — Z7901 Long term (current) use of anticoagulants: Secondary | ICD-10-CM

## 2012-09-28 LAB — POCT INR: INR: 3

## 2012-09-28 MED ORDER — WARFARIN SODIUM 5 MG PO TABS: 5.0000 mg | ORAL_TABLET | Freq: Every day | ORAL | Status: DC

## 2012-09-28 NOTE — Progress Notes (Addendum)
Anti-Coagulation Progress Note  Jonathan Reed is a 67 y.o. male who is currently on an anti-coagulation regimen.    RECENT RESULTS: Recent results are below, the most recent result is correlated with a dose of 22.5 mg. per week:  His pharmacy was called and the ePrescription sent by me for authorization was amended to reflect #30 tablets 5mg  to be dispensed with only ONE refill.  Lab Results  Component Value Date   INR 3.00 09/28/2012   INR 2.80 09/07/2012   INR 3.10 08/17/2012   PROTIME 30.0* 05/15/2012    ANTI-COAG DOSE: Anticoagulation Dose Instructions as of 09/28/2012     [name] Tue Wed Thu Fri Sat   New Dose 2.5 mg 2.5 mg 2.5 mg 5 mg 2.5 mg 2.5 mg 2.5 mg       ANTICOAG SUMMARY: Anticoagulation Episode Summary   Current INR goal 2.0-3.0  Next INR check 10/19/2012  INR from last check 3.00 (09/28/2012)  Weekly max dose   Target end date Indefinite  [facility]  Preferred lab   Send INR reminders to    Indications  CVA (cerebral infarction) [434.91] Long term (current) use of anticoagulants [V58.61]        Comments         ANTICOAG TODAY: Anticoagulation Summary as of 09/28/2012   INR goal 2.0-3.0  Selected INR 3.00 (09/28/2012)  Next INR check 10/19/2012  Target end date Indefinite   Indications  CVA (cerebral infarction) [434.91] Long term (current) use of anticoagulants [V58.61]      Anticoagulation Episode Summary   [facility]   Preferred lab    Send INR reminders to    Comments       PATIENT INSTRUCTIONS: Patient Instructions  Patient instructed to take medications as defined in the Anti-coagulation Track section of this encounter.  Patient instructed to take today's dose.  Patient verbalized understanding of these instructions.       FOLLOW-UP Return in 3 weeks (on 10/19/2012) for Follow up INR at 2:30PM.  [name], III Pharm.D., CACP

## 2012-09-28 NOTE — Addendum Note (Signed)
Addended by: [name] B on: 09/28/2012 04:34 PM   Modules accepted: Orders

## 2012-09-28 NOTE — Patient Instructions (Signed)
Patient instructed to take medications as defined in the Anti-coagulation Track section of this encounter.  Patient instructed to take today's dose.  Patient verbalized understanding of these instructions.    

## 2012-09-28 NOTE — Telephone Encounter (Signed)
I have asked Dr. Alexandria Lodge to call pharmacy and change the number of refills from 12 refills to 1 refill.

## 2012-09-29 ENCOUNTER — Other Ambulatory Visit: Payer: Self-pay | Admitting: Internal Medicine

## 2012-09-29 ENCOUNTER — Telehealth: Payer: Self-pay | Admitting: Internal Medicine

## 2012-09-29 NOTE — Telephone Encounter (Signed)
I called pharmacy and corrected the warfarin sig from one 5 mg tablet daily to "take as directed"; patient's current weekly dose is 20 mg as per the anticoagulation clinic note, so pharmacy will dispense #16 (a one month supply) with one refill available.  This will need to be revised if the dose changes.

## 2012-09-29 NOTE — Telephone Encounter (Signed)
I discussed with Dr. Alexandria Lodge, who is comfortable with a 3 month supply.

## 2012-10-19 ENCOUNTER — Ambulatory Visit (INDEPENDENT_AMBULATORY_CARE_PROVIDER_SITE_OTHER): Payer: Medicaid Other | Admitting: Pharmacist

## 2012-10-19 DIAGNOSIS — I639 Cerebral infarction, unspecified: Secondary | ICD-10-CM

## 2012-10-19 DIAGNOSIS — Z7901 Long term (current) use of anticoagulants: Secondary | ICD-10-CM

## 2012-10-19 DIAGNOSIS — I6350 Cerebral infarction due to unspecified occlusion or stenosis of unspecified cerebral artery: Secondary | ICD-10-CM

## 2012-10-19 LAB — POCT INR: INR: 2.2

## 2012-10-19 NOTE — Progress Notes (Signed)
Anti-Coagulation Progress Note  Jonathan Reed is a 67 y.o. male who is currently on an anti-coagulation regimen.    RECENT RESULTS: Recent results are below, the most recent result is correlated with a dose of 20 mg. per week: Lab Results  Component Value Date   INR 2.20 10/19/2012   INR 3.00 09/28/2012   INR 2.80 09/07/2012   PROTIME 30.0* 05/15/2012    ANTI-COAG DOSE: Anticoagulation Dose Instructions as of 10/19/2012     [name] Tue Wed Thu Fri Sat   New Dose 2.5 mg 2.5 mg 5 mg 2.5 mg 2.5 mg 5 mg 2.5 mg       ANTICOAG SUMMARY: Anticoagulation Episode Summary   Current INR goal 2.0-3.0  Next INR check 11/09/2012  INR from last check 2.20 (10/19/2012)  Weekly max dose   Target end date Indefinite  [facility]  Preferred lab   Send INR reminders to    Indications  CVA (cerebral infarction) [434.91] Long term (current) use of anticoagulants [V58.61]        Comments         ANTICOAG TODAY: Anticoagulation Summary as of 10/19/2012   INR goal 2.0-3.0  Selected INR 2.20 (10/19/2012)  Next INR check 11/09/2012  Target end date Indefinite   Indications  CVA (cerebral infarction) [434.91] Long term (current) use of anticoagulants [V58.61]      Anticoagulation Episode Summary   [facility]   Preferred lab    Send INR reminders to    Comments       PATIENT INSTRUCTIONS: Patient Instructions  Patient instructed to take medications as defined in the Anti-coagulation Track section of this encounter.  Patient instructed to take today's dose.  Patient verbalized understanding of these instructions.       FOLLOW-UP Return in 3 weeks (on 11/09/2012) for Follow up INR at 2:15PM.  [name], III Pharm.D., CACP

## 2012-10-19 NOTE — Patient Instructions (Signed)
Patient instructed to take medications as defined in the Anti-coagulation Track section of this encounter.  Patient instructed to take today's dose.  Patient verbalized understanding of these instructions.    

## 2012-10-21 ENCOUNTER — Other Ambulatory Visit (HOSPITAL_BASED_OUTPATIENT_CLINIC_OR_DEPARTMENT_OTHER): Payer: Medicaid Other | Admitting: Lab

## 2012-10-21 ENCOUNTER — Ambulatory Visit (HOSPITAL_BASED_OUTPATIENT_CLINIC_OR_DEPARTMENT_OTHER): Payer: Medicaid Other | Admitting: Oncology

## 2012-10-21 ENCOUNTER — Encounter: Payer: Self-pay | Admitting: Oncology

## 2012-10-21 VITALS — BP 127/78 | HR 57 | Temp 98.2°F | Resp 20 | Ht 70.0 in | Wt 200.3 lb

## 2012-10-21 DIAGNOSIS — C7A00 Malignant carcinoid tumor of unspecified site: Secondary | ICD-10-CM

## 2012-10-21 DIAGNOSIS — C7B8 Other secondary neuroendocrine tumors: Secondary | ICD-10-CM

## 2012-10-21 DIAGNOSIS — C787 Secondary malignant neoplasm of liver and intrahepatic bile duct: Secondary | ICD-10-CM

## 2012-10-21 DIAGNOSIS — D3A00 Benign carcinoid tumor of unspecified site: Secondary | ICD-10-CM

## 2012-10-21 LAB — COMPREHENSIVE METABOLIC PANEL (CC13)
Albumin: 3.9 g/dL (ref 3.5–5.0)
Alkaline Phosphatase: 63 U/L (ref 40–150)
Chloride: 106 mEq/L (ref 98–109)
Glucose: 116 mg/dl (ref 70–140)
Potassium: 3.8 mEq/L (ref 3.5–5.1)
Sodium: 140 mEq/L (ref 136–145)
Total Protein: 7.6 g/dL (ref 6.4–8.3)

## 2012-10-21 LAB — CBC WITH DIFFERENTIAL/PLATELET
Eosinophils Absolute: 0.1 10*3/uL (ref 0.0–0.5)
MONO#: 0.5 10*3/uL (ref 0.1–0.9)
MONO%: 12.2 % (ref 0.0–14.0)
NEUT#: 2 10*3/uL (ref 1.5–6.5)
RBC: 3.96 10*6/uL — ABNORMAL LOW (ref 4.20–5.82)
RDW: 17.1 % — ABNORMAL HIGH (ref 11.0–14.6)
WBC: 4 10*3/uL (ref 4.0–10.3)
lymph#: 1.4 10*3/uL (ref 0.9–3.3)

## 2012-10-21 MED ORDER — TEMOZOLOMIDE 100 MG PO CAPS: 200.0000 mg/m2/d | ORAL_CAPSULE | Freq: Every day | ORAL | Status: DC

## 2012-10-21 MED ORDER — ONDANSETRON HCL 4 MG PO TABS: 4.0000 mg | ORAL_TABLET | Freq: Two times a day (BID) | ORAL | Status: DC | PRN

## 2012-10-21 MED ORDER — OXYCODONE HCL 5 MG PO TABS: 5.0000 mg | ORAL_TABLET | Freq: Four times a day (QID) | ORAL | Status: DC | PRN

## 2012-10-21 MED ORDER — CAPECITABINE 500 MG PO TABS: ORAL_TABLET | ORAL | Status: DC

## 2012-10-21 NOTE — Progress Notes (Signed)
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   PAST THERAPY: biopsy only.   CURRENT THERAPY: started on Xeloda 1000mg /m2 BID d1-14; Temodar 200mg /m2 daily d10-14; every 28 day-cycle on 05/04/2012.   INTERVAL HISTORY: Jonathan Reed 67 y.o. male returns for regular follow up with his 2 daughters. He has been on chemo with darkening of his hands and feet.  He otherwise tolerates chemo very well.  He is still working full time at a [employer] (life [employer]). His weight is increasing.  He denied any abdominal pain which he had when he was first diagnosed.    Patient denies fever, anorexia, weight loss, fatigue, headache, visual changes, confusion, drenching night sweats, palpable lymph node swelling, mucositis, odynophagia, dysphagia, nausea vomiting, jaundice, chest pain, palpitation, shortness of breath, dyspnea on exertion, productive cough, gum bleeding, epistaxis, hematemesis, hemoptysis, abdominal pain, abdominal swelling, early satiety, melena, hematochezia, spontaneous bleeding, joint swelling, joint pain, heat or cold intolerance, bowel bladder incontinence, back pain, focal motor weakness, paresthesia, depression.     Past Medical History  Diagnosis Date  . CHF (congestive heart failure)     with ICD (f/u with [facility])  . CVA (cerebral infarction) 2011    No residual Sx  . CAD (coronary artery disease) 2000  . HTN (hypertension)   . Carcinoid syndrome   . Metastatic malignant neuroendocrine tumor to liver     Past Surgical History  Procedure Laterality Date  . Cardiac defibrillator placement    . Inguinal hernia repair Bilateral   . Sinus surgery with instatrak    . Colonoscopy  1980    colon polyps, no f/u since.     Current Outpatient Prescriptions  Medication Sig Dispense Refill  . capecitabine  (XELODA) 500 MG tablet TAKE 4 TABLETS BY MOUTH TWO TIMES DAILY AFTER A MEAL (TAKE ON DAYS 1-14 OF EACH 28 DAY CHEMO CYCLE)  112 tablet  0  . lisinopril-hydrochlorothiazide (PRINZIDE,ZESTORETIC) 20-25 MG per tablet Take 0.5 tablets by mouth daily.  90 tablet  2  . metoprolol succinate (TOPROL-XL) 25 MG 24 hr tablet Take 1 tablet (25 mg total) by mouth daily.  90 tablet  3  . ondansetron (ZOFRAN) 4 MG tablet Take 1 tablet (4 mg total) by mouth every 12 (twelve) hours as needed for nausea.  30 tablet  3  . oxyCODONE (ROXICODONE) 5 MG immediate release tablet Take 1 tablet (5 mg total) by mouth every 6 (six) hours as needed for pain.  30 tablet  0  . prochlorperazine (COMPAZINE) 10 MG tablet Take 1 tablet (10 mg total) by mouth every 6 (six) hours as needed.  30 tablet  3  . spironolactone (ALDACTONE) 25 MG tablet Take 25 mg by mouth daily.      Marland Kitchen sulfamethoxazole-trimethoprim (BACTRIM DS,SEPTRA DS) 800-160 MG per tablet Take 1 tablet by mouth every Monday, Wednesday, and Friday.  30 tablet  3  . temozolomide (TEMODAR) 100 MG capsule Take 4 capsules (400 mg total) by mouth daily. May take on an empty stomach or at bedtime to decrease nausea & vomiting.  20 capsule  3  . warfarin (COUMADIN) 5 MG tablet TAKE AS DIRECTED  51 tablet  0   No current facility-administered medications for this visit.    ALLERGIES:  has No Known Allergies.  REVIEW OF SYSTEMS:  The rest of the  14-point review of system was negative.   Filed Vitals:   10/21/12 0911  BP: 127/78  Pulse: 57  Temp: 98.2 F (36.8 C)  Resp: 20   Wt Readings from Last 3 Encounters:  10/21/12 200 lb 4.8 oz (90.855 kg)  09/23/12 201 lb 1.6 oz (91.218 kg)  08/24/12 194 lb 14.4 oz (88.406 kg)   ECOG Performance status: 0  PHYSICAL EXAMINATION:   General:  well-nourished man in no acute distress.  Eyes:  no scleral icterus.  ENT:  There were no oropharyngeal lesions.  Neck was without thyromegaly.  Lymphatics:  Negative cervical,  supraclavicular or axillary adenopathy.  Respiratory: lungs were clear bilaterally without wheezing or crackles.  Cardiovascular:  Regular rate and rhythm, S1/S2, without murmur, rub or gallop.  There was no pedal edema.  GI:  abdomen was soft, flat, nondistended.  There was no organomegaly. There was no tender to abdominal palpation. Musculoskeletal:  no spinal tenderness of palpation of vertebral spine.  Skin exam was without echymosis, petichae. There was darkening of the hands but no pain, desquamation.  Neuro exam was nonfocal.  Patient was able to get on and off exam table without assistance.  Gait was normal.  Patient was alert and oriented.  Attention was good.   Language was appropriate.  Mood was normal without depression.  Speech was not pressured.  Thought content was not tangential.     LABORATORY/RADIOLOGY DATA:  Lab Results  Component Value Date   WBC 4.0 10/21/2012   HGB 14.3 10/21/2012   HCT 41.3 10/21/2012   PLT 147 10/21/2012   GLUCOSE 116 10/21/2012   CHOL 219* 06/08/2012   TRIG 272* 06/08/2012   HDL 34* 06/08/2012   LDLCALC 131* 06/08/2012   ALKPHOS 63 10/21/2012   ALT 19 10/21/2012   AST 19 10/21/2012   NA 140 10/21/2012   K 3.8 10/21/2012   CL 105 08/24/2012   CREATININE 1.5* 10/21/2012   BUN 19.4 10/21/2012   CO2 23 10/21/2012   INR 2.20 10/19/2012   HGBA1C 6.5* 04/12/2012    ASSESSMENT AND PLAN:   1. Hypertension: He is on Metoprolol.  2. CHF: euvolumic on exam today. 3. History of Vtach: He has defibrillator/pacemaker; and is on Coumadin. He just had it exchanged recently at [facility].  4. History of CVA: He is on Coumadin and blood pressure control.   5.  Metastatic neuroendocrine tumor.  - Treatment:Xeloda 1000mg /m2 BID d1-14; Temodar 200mg /m2 daily d10-14 - He has had very good partial resonse after 3 cycles.  He was seen by Surg Onc (Dr. Loyce Dys) and Med Onc (Dr. Reginia Naas) at [facility] who deemed that he was not a resectable candidate.  Dr. Lattie Corns agreed with continuing the current  regimen for a few more months as long as patient is having response and no dose limiting toxicity.  When he stops chemo, options will be Sandostatin and/or Y-90 treatment.  - My recommendation:  Proceed with chemo this month without dose modification.  Plan for restaging in the next few months to ascertain the response in the liver mass and treatment options at the time.  He does not want to be on chemo too long even though he has achieved very good partial response and he has no dose limiting toxicity.   6.  Follow up:  In about 1 month.       The length of time of the face-to-face encounter was 25 minutes. More than 50% of time was spent counseling and coordination  of care.

## 2012-10-26 ENCOUNTER — Other Ambulatory Visit: Payer: Self-pay | Admitting: *Deleted

## 2012-10-26 MED ORDER — TEMOZOLOMIDE 100 MG PO CAPS: 200.0000 mg/m2/d | ORAL_CAPSULE | Freq: Every day | ORAL | Status: DC

## 2012-10-26 NOTE — Telephone Encounter (Signed)
Script for [employer], signed by [employer], stating brand name medially necessary, faxed to [email or web address] [phone].

## 2012-10-31 ENCOUNTER — Other Ambulatory Visit: Payer: Self-pay | Admitting: Internal Medicine

## 2012-11-09 ENCOUNTER — Ambulatory Visit (INDEPENDENT_AMBULATORY_CARE_PROVIDER_SITE_OTHER): Payer: Medicaid Other | Admitting: Pharmacist

## 2012-11-09 DIAGNOSIS — Z7901 Long term (current) use of anticoagulants: Secondary | ICD-10-CM

## 2012-11-09 DIAGNOSIS — I6350 Cerebral infarction due to unspecified occlusion or stenosis of unspecified cerebral artery: Secondary | ICD-10-CM

## 2012-11-09 DIAGNOSIS — I639 Cerebral infarction, unspecified: Secondary | ICD-10-CM

## 2012-11-09 NOTE — Patient Instructions (Signed)
Patient instructed to take medications as defined in the Anti-coagulation Track section of this encounter.  Patient instructed to take today's dose.  Patient verbalized understanding of these instructions.    

## 2012-11-09 NOTE — Progress Notes (Signed)
Anti-Coagulation Progress Note  Jonathan Reed is a 67 y.o. male who is currently on an anti-coagulation regimen.    RECENT RESULTS: Recent results are below, the most recent result is correlated with a dose of 22.5 mg. per week: Lab Results  Component Value Date   INR 2.0 11/09/2012   INR 2.20 10/19/2012   INR 3.00 09/28/2012   PROTIME 30.0* 05/15/2012    ANTI-COAG DOSE: Anticoagulation Dose Instructions as of 11/09/2012     [name] Tue Wed Thu Fri Sat   New Dose 2.5 mg 5 mg 2.5 mg 5 mg 2.5 mg 5 mg 2.5 mg       ANTICOAG SUMMARY: Anticoagulation Episode Summary   Current INR goal 2.0-3.0  Next INR check 11/23/2012  INR from last check 2.0 (11/09/2012)  Weekly max dose   Target end date Indefinite  [facility]  Preferred lab   Send INR reminders to    Indications  CVA (cerebral infarction) [434.91] Long term (current) use of anticoagulants [V58.61]        Comments         ANTICOAG TODAY: Anticoagulation Summary as of 11/09/2012   INR goal 2.0-3.0  Selected INR 2.0 (11/09/2012)  Next INR check 11/23/2012  Target end date Indefinite   Indications  CVA (cerebral infarction) [434.91] Long term (current) use of anticoagulants [V58.61]      Anticoagulation Episode Summary   [facility]   Preferred lab    Send INR reminders to    Comments       PATIENT INSTRUCTIONS: Patient Instructions  Patient instructed to take medications as defined in the Anti-coagulation Track section of this encounter.  Patient instructed to take today's dose.  Patient verbalized understanding of these instructions.       FOLLOW-UP Return in 2 weeks (on 11/23/2012) for Follow up INR at 3:15PM.  [name], III Pharm.D., CACP

## 2012-11-18 ENCOUNTER — Telehealth: Payer: Self-pay | Admitting: Hematology and Oncology

## 2012-11-18 ENCOUNTER — Ambulatory Visit (HOSPITAL_BASED_OUTPATIENT_CLINIC_OR_DEPARTMENT_OTHER): Payer: Medicaid Other | Admitting: Hematology and Oncology

## 2012-11-18 ENCOUNTER — Other Ambulatory Visit (HOSPITAL_BASED_OUTPATIENT_CLINIC_OR_DEPARTMENT_OTHER): Payer: Medicaid Other | Admitting: Lab

## 2012-11-18 VITALS — BP 128/86 | HR 54 | Temp 97.7°F | Resp 18 | Ht 70.0 in | Wt 205.1 lb

## 2012-11-18 DIAGNOSIS — D3A00 Benign carcinoid tumor of unspecified site: Secondary | ICD-10-CM

## 2012-11-18 DIAGNOSIS — C7B8 Other secondary neuroendocrine tumors: Secondary | ICD-10-CM

## 2012-11-18 DIAGNOSIS — C7A00 Malignant carcinoid tumor of unspecified site: Secondary | ICD-10-CM

## 2012-11-18 DIAGNOSIS — C787 Secondary malignant neoplasm of liver and intrahepatic bile duct: Secondary | ICD-10-CM

## 2012-11-18 LAB — COMPREHENSIVE METABOLIC PANEL (CC13)
Albumin: 3.7 g/dL (ref 3.5–5.0)
Alkaline Phosphatase: 62 U/L (ref 40–150)
BUN: 18.7 mg/dL (ref 7.0–26.0)
CO2: 17 mEq/L — ABNORMAL LOW (ref 22–29)
Glucose: 133 mg/dl (ref 70–140)
Potassium: 3.7 mEq/L (ref 3.5–5.1)

## 2012-11-18 LAB — CBC WITH DIFFERENTIAL/PLATELET
Basophils Absolute: 0 10*3/uL (ref 0.0–0.1)
Eosinophils Absolute: 0.1 10*3/uL (ref 0.0–0.5)
HGB: 13.6 g/dL (ref 13.0–17.1)
LYMPH%: 30.1 % (ref 14.0–49.0)
MCV: 104.8 fL — ABNORMAL HIGH (ref 79.3–98.0)
MONO%: 12.2 % (ref 0.0–14.0)
NEUT#: 2.3 10*3/uL (ref 1.5–6.5)
Platelets: 189 10*3/uL (ref 140–400)
RDW: 15.9 % — ABNORMAL HIGH (ref 11.0–14.6)

## 2012-11-18 MED ORDER — OXYCODONE HCL 5 MG PO TABS: 5.0000 mg | ORAL_TABLET | Freq: Four times a day (QID) | ORAL | Status: DC | PRN

## 2012-11-18 NOTE — Telephone Encounter (Signed)
gv andprinted appt sched and avs...per [facility] all appt until after ct....gv pt barium

## 2012-11-19 NOTE — Progress Notes (Signed)
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   PAST THERAPY: biopsy only.   CURRENT THERAPY: Xeloda 1000mg /m2 BID d1-14; Temodar 200mg /m2 daily d10-14; every 28 day-cycle from 05/04/2012.   INTERVAL HISTORY:  Jonathan Reed 67 y.o. male who returns for regular follow up visit with his wife and daughter. He has been on chemo with darkening of his hands and feet.His appetite is good and he gains weight. Sometimes patient has pain on right side of his chest. He continue to have superficial ulceration on inner side of foreskin of pennis for which he uses lubricant. Occasionally he constipated.  He otherwise tolerates chemo very well. The patient denied fever, chills, night sweats. He denied headaches, double vision, blurry vision, nasal congestion, nasal discharge, hearing problems, odynophagia or dysphagia. No  palpitations, dyspnea, cough, abdominal pain, nausea, vomiting, diarrhea, hematochezia. The patient denied dysuria, nocturia, polyuria, hematuria, myalgia, numbness, tingling, psychiatric problems.  Review of Systems  Constitutional: Negative for fever, chills, weight loss, malaise/fatigue and diaphoresis.  HENT: Negative for hearing loss, ear pain, nosebleeds, congestion, sore throat, neck pain and tinnitus.   Eyes: Negative for blurred vision, double vision, photophobia and pain.  Respiratory: Negative for cough, hemoptysis, sputum production, shortness of breath and wheezing.   Cardiovascular: Positive for chest pain. Negative for palpitations, orthopnea, claudication, leg swelling and PND.  Gastrointestinal: Positive for constipation. Negative for nausea, vomiting, abdominal pain, diarrhea, blood in stool and melena.  Genitourinary: Negative for dysuria,  urgency, frequency, hematuria and flank pain.  Musculoskeletal: Negative for myalgias, back pain and joint pain.  Skin: Negative for itching and rash.  Neurological: Negative for dizziness, tingling, tremors, sensory change, speech change, focal weakness, seizures, loss of consciousness, weakness and headaches.  Endo/Heme/Allergies: Does not bruise/bleed easily.  Psychiatric/Behavioral: Negative.    PAST MEDICAL HISTORY: Past Medical History  Diagnosis Date  . CHF (congestive heart failure)     with ICD (f/u with [facility])  . CVA (cerebral infarction) 2011    No residual Sx  . CAD (coronary artery disease) 2000  . HTN (hypertension)   . Carcinoid syndrome   . Metastatic malignant neuroendocrine tumor to liver     PAST SURGICAL HISTORY: Past Surgical History  Procedure Laterality Date  . Cardiac defibrillator placement    . Inguinal hernia repair Bilateral   . Sinus surgery with instatrak    . Colonoscopy  1980    colon polyps, no f/u since.     FAMILY HISTORY Family History  Problem Relation Age of Onset  . Stroke Mother   . Diabetes Father     diabetic coma  . Diabetes Sister   . Cancer Sister     intestinal ca?  . Prostate cancer Brother     prostate  . Breast cancer Maternal Aunt   . Clotting disorder Brother   . Heart disease Brother   . Kidney disease Brother   . Stroke Sister   . Hypertension Daughter    HEALTH MAINTENANCE: History  Substance Use Topics  . Smoking status: Never Smoker   . Smokeless tobacco: Never Used  . Alcohol Use: No   No Known Allergies  Current Outpatient Prescriptions  Medication Sig Dispense Refill  . capecitabine (XELODA) 500 MG tablet TAKE 4 TABLETS BY MOUTH TWO TIMES DAILY AFTER A MEAL (TAKE  ON DAYS 1-14 OF EACH 28 DAY CHEMO CYCLE)  112 tablet  0  . DOCQLACE 100 MG capsule TAKE ONE CAPSULE BY MOUTH EVERY DAY  30 capsule  0  . lisinopril-hydrochlorothiazide (PRINZIDE,ZESTORETIC) 20-25 MG per tablet Take 0.5 tablets by mouth  daily.  90 tablet  2  . metoprolol succinate (TOPROL-XL) 25 MG 24 hr tablet Take 1 tablet (25 mg total) by mouth daily.  90 tablet  3  . ondansetron (ZOFRAN) 4 MG tablet Take 1 tablet (4 mg total) by mouth every 12 (twelve) hours as needed for nausea.  30 tablet  3  . oxyCODONE (ROXICODONE) 5 MG immediate release tablet Take 1 tablet (5 mg total) by mouth every 6 (six) hours as needed for pain.  30 tablet  0  . prochlorperazine (COMPAZINE) 10 MG tablet Take 1 tablet (10 mg total) by mouth every 6 (six) hours as needed.  30 tablet  3  . spironolactone (ALDACTONE) 25 MG tablet Take 25 mg by mouth daily.      Marland Kitchen temozolomide (TEMODAR) 100 MG capsule Take 4 capsules (400 mg total) by mouth daily. May take on an empty stomach or at bedtime to decrease nausea & vomiting.  20 capsule  3  . warfarin (COUMADIN) 5 MG tablet TAKE AS DIRECTED  51 tablet  0   No current facility-administered medications for this visit.    OBJECTIVE: Filed Vitals:   11/18/12 0851  BP: 128/86  Pulse: 54  Temp: 97.7 F (36.5 C)  Resp: 18     Body mass index is 29.43 kg/(m^2).    ECOG FS: 0  PHYSICAL EXAMINATION:  HEENT: Sclerae anicteric.  Conjunctivae were pink. Pupils round and reactive bilaterally. Oral mucosa is moist without ulceration or thrush. No occipital, submandibular, cervical, supraclavicular or axillar adenopathy. Lungs: clear to auscultation without wheezes. No rales or rhonchi. Heart: regular rate and rhythm. No murmur, gallop or rubs. Abdomen: soft, non tender. No guarding or rebound tenderness. Bowel sounds are present. No palpable hepatosplenomegaly. MSK: no focal spinal tenderness. Extremities: No clubbing or cyanosis.No calf tenderness to palpitation, no peripheral edema. The patient had grossly intact strength in upper and lower extremities. Skin exam was without ecchymosis, petechiae.Superficial ulceration on inner side of foreskin of pennis  Neuro: non-focal, alert and oriented to time, person  and place, appropriate affect  LAB RESULTS:  CMP     Component Value Date/Time   NA 138 11/18/2012 0826   NA 136 04/16/2012 1016   K 3.7 11/18/2012 0826   K 4.1 04/16/2012 1016   CL 105 08/24/2012 0849   CL 101 04/16/2012 1016   CO2 17* 11/18/2012 0826   CO2 25 04/16/2012 1016   GLUCOSE 133 11/18/2012 0826   GLUCOSE 100* 08/24/2012 0849   GLUCOSE 112* 04/16/2012 1016   BUN 18.7 11/18/2012 0826   BUN 21 04/16/2012 1016   CREATININE 1.3 11/18/2012 0826   CREATININE 1.09 04/16/2012 1016   CALCIUM 9.5 11/18/2012 0826   CALCIUM 9.3 04/16/2012 1016   PROT 7.7 11/18/2012 0826   PROT 7.1 04/16/2012 1016   ALBUMIN 3.7 11/18/2012 0826   ALBUMIN 2.7* 04/16/2012 1016   AST 19 11/18/2012 0826   AST 40* 04/16/2012 1016   ALT 18 11/18/2012 0826   ALT 35 04/16/2012 1016   ALKPHOS 62 11/18/2012 0826   ALKPHOS 203* 04/16/2012 1016   BILITOT 0.31 11/18/2012 0826   BILITOT 0.8 04/16/2012 1016   GFRNONAA 70* 04/16/2012 1016   GFRAA 81* 04/16/2012 1016  Lab Results  Component Value Date   WBC 4.1 11/18/2012   NEUTROABS 2.3 11/18/2012   HGB 13.6 11/18/2012   HCT 38.9 11/18/2012   MCV 104.8* 11/18/2012   PLT 189 11/18/2012      Chemistry      Component Value Date/Time   NA 138 11/18/2012 0826   NA 136 04/16/2012 1016   K 3.7 11/18/2012 0826   K 4.1 04/16/2012 1016   CL 105 08/24/2012 0849   CL 101 04/16/2012 1016   CO2 17* 11/18/2012 0826   CO2 25 04/16/2012 1016   BUN 18.7 11/18/2012 0826   BUN 21 04/16/2012 1016   CREATININE 1.3 11/18/2012 0826   CREATININE 1.09 04/16/2012 1016      Component Value Date/Time   CALCIUM 9.5 11/18/2012 0826   CALCIUM 9.3 04/16/2012 1016   ALKPHOS 62 11/18/2012 0826   ALKPHOS 203* 04/16/2012 1016   AST 19 11/18/2012 0826   AST 40* 04/16/2012 1016   ALT 18 11/18/2012 0826   ALT 35 04/16/2012 1016   BILITOT 0.31 11/18/2012 0826   BILITOT 0.8 04/16/2012 1016      STUDIES: No results found.  ASSESSMENT AND PLAN: 1. Metastatic neuroendocrine tumor.  - Treatment:Xeloda 1000mg /m2 BID d1-14; Temodar 200mg /m2 daily  d10-14 S/p 7 cycles finished on 11/14/12 - He has had very good partial response after 3 cycles on CT scan from 07/22/12. In past he was seen by Surg Onc (Dr. Loyce Dys) and Med Onc (Dr. Reginia Naas) at [facility] who deemed that he was not a resectable candidate. Dr. Lattie Corns recommended to continue the current regimen for a few more months as long as patient is having response and no dose limiting toxicity. When he stops chemo, options will be Sandostatin and/or Y-90 treatment. Patient developed chest pain on right side which is new his last CT scan was in May 2014. I will order CT scan of chest abdomen and pelvis. 2 . I advised him to continue with lubrication with his penile gland if there is irritation. He can also try to use Neosporin 3- 4 times a day.  3 Bactrim will be stop. No recommendation for Bactrim use for Xeloda or Temodar. 4. Oxycodone was refilled. 5. Follow up: In about 1-2 weeks after CT scan.    Myra Rude, MD   11/19/2012 5:12 AM

## 2012-11-23 ENCOUNTER — Ambulatory Visit (INDEPENDENT_AMBULATORY_CARE_PROVIDER_SITE_OTHER): Payer: Medicaid Other | Admitting: Pharmacist

## 2012-11-23 DIAGNOSIS — I6350 Cerebral infarction due to unspecified occlusion or stenosis of unspecified cerebral artery: Secondary | ICD-10-CM

## 2012-11-23 DIAGNOSIS — I639 Cerebral infarction, unspecified: Secondary | ICD-10-CM

## 2012-11-23 DIAGNOSIS — Z7901 Long term (current) use of anticoagulants: Secondary | ICD-10-CM

## 2012-11-24 LAB — POCT INR: INR: 2.9

## 2012-11-24 NOTE — Progress Notes (Signed)
Anti-Coagulation Progress Note  Jonathan Reed is a 67 y.o. male who is currently on an anti-coagulation regimen.    RECENT RESULTS: Recent results are below, the most recent result is correlated with a dose of 25 mg. per week: Lab Results  Component Value Date   INR 2.90 11/23/2012   INR 2.0 11/09/2012   INR 2.20 10/19/2012   PROTIME 30.0* 05/15/2012    ANTI-COAG DOSE: Anticoagulation Dose Instructions as of 11/23/2012     [name] Tue Wed Thu Fri Sat   New Dose 2.5 mg 5 mg 2.5 mg 5 mg 2.5 mg 5 mg 2.5 mg       ANTICOAG SUMMARY: Anticoagulation Episode Summary   Current INR goal 2.0-3.0  Next INR check 12/07/2012  INR from last check 2.90 (11/24/2012)  Most recent INR 2.90 (11/24/2012)  Weekly max dose   Target end date Indefinite  [facility] check location [facility]  Preferred lab   Send INR reminders to    Indications  CVA (cerebral infarction) [434.91] Long term (current) use of anticoagulants [V58.61]        Comments         ANTICOAG TODAY: Anticoagulation Summary as of 11/23/2012   INR goal 2.0-3.0  Selected INR 2.90 (11/24/2012)  Next INR check 12/07/2012  Target end date Indefinite   Indications  CVA (cerebral infarction) [434.91] Long term (current) use of anticoagulants [V58.61]      Anticoagulation Episode Summary   [facility]R check [facility]   Preferred lab    Send INR reminders to    Comments       PATIENT INSTRUCTIONS: Patient Instructions  Patient instructed to take medications as defined in the Anti-coagulation Track section of this encounter.  Patient instructed to take today's dose.  Patient verbalized understanding of these instructions.       FOLLOW-UP Return in 2 weeks (on 12/07/2012) for Follow up INR at 2:30PM.  [name], III Pharm.D., CACP

## 2012-11-24 NOTE — Patient Instructions (Signed)
Patient instructed to take medications as defined in the Anti-coagulation Track section of this encounter.  Patient instructed to take today's dose.  Patient verbalized understanding of these instructions.    

## 2012-11-25 NOTE — Progress Notes (Signed)
Indication for anticoagulation is CVA, high chads score.  I reviewed Dr. Saralyn Pilar note.

## 2012-12-01 ENCOUNTER — Encounter (HOSPITAL_COMMUNITY): Payer: Self-pay

## 2012-12-01 ENCOUNTER — Ambulatory Visit (HOSPITAL_COMMUNITY)
Admission: RE | Admit: 2012-12-01 | Discharge: 2012-12-01 | Disposition: A | Discharge status: Home / Self Care | Payer: Medicaid Other | Source: Ambulatory Visit | Attending: Hematology and Oncology | Admitting: Hematology and Oncology

## 2012-12-01 DIAGNOSIS — Z79899 Other long term (current) drug therapy: Secondary | ICD-10-CM | POA: Insufficient documentation

## 2012-12-01 DIAGNOSIS — I708 Atherosclerosis of other arteries: Secondary | ICD-10-CM | POA: Insufficient documentation

## 2012-12-01 DIAGNOSIS — I700 Atherosclerosis of aorta: Secondary | ICD-10-CM | POA: Insufficient documentation

## 2012-12-01 DIAGNOSIS — C7B8 Other secondary neuroendocrine tumors: Secondary | ICD-10-CM

## 2012-12-01 DIAGNOSIS — C787 Secondary malignant neoplasm of liver and intrahepatic bile duct: Secondary | ICD-10-CM | POA: Insufficient documentation

## 2012-12-01 DIAGNOSIS — M47817 Spondylosis without myelopathy or radiculopathy, lumbosacral region: Secondary | ICD-10-CM | POA: Insufficient documentation

## 2012-12-01 DIAGNOSIS — I2510 Atherosclerotic heart disease of native coronary artery without angina pectoris: Secondary | ICD-10-CM | POA: Insufficient documentation

## 2012-12-01 DIAGNOSIS — C7A00 Malignant carcinoid tumor of unspecified site: Secondary | ICD-10-CM | POA: Insufficient documentation

## 2012-12-01 MED ORDER — IOHEXOL 300 MG/ML  SOLN
100.0000 mL | Freq: Once | INTRAMUSCULAR | Status: AC | PRN
  Administered 2012-12-01: 100 mL via INTRAVENOUS

## 2012-12-03 ENCOUNTER — Other Ambulatory Visit: Payer: Self-pay | Admitting: *Deleted

## 2012-12-03 ENCOUNTER — Telehealth: Payer: Self-pay | Admitting: Hematology and Oncology

## 2012-12-03 ENCOUNTER — Ambulatory Visit (HOSPITAL_BASED_OUTPATIENT_CLINIC_OR_DEPARTMENT_OTHER): Payer: Medicaid Other | Admitting: Hematology and Oncology

## 2012-12-03 ENCOUNTER — Encounter: Payer: Self-pay | Admitting: Hematology and Oncology

## 2012-12-03 ENCOUNTER — Other Ambulatory Visit: Payer: Self-pay | Admitting: Hematology and Oncology

## 2012-12-03 VITALS — BP 131/72 | HR 50 | Temp 97.8°F | Resp 20 | Ht 70.0 in | Wt 206.1 lb

## 2012-12-03 DIAGNOSIS — I428 Other cardiomyopathies: Secondary | ICD-10-CM

## 2012-12-03 DIAGNOSIS — C7A00 Malignant carcinoid tumor of unspecified site: Secondary | ICD-10-CM

## 2012-12-03 DIAGNOSIS — C787 Secondary malignant neoplasm of liver and intrahepatic bile duct: Secondary | ICD-10-CM

## 2012-12-03 DIAGNOSIS — C7B8 Other secondary neuroendocrine tumors: Secondary | ICD-10-CM

## 2012-12-03 MED ORDER — CAPECITABINE 500 MG PO TABS: 1000.0000 mg/m2 | ORAL_TABLET | Freq: Two times a day (BID) | ORAL | Status: DC

## 2012-12-03 MED ORDER — TEMOZOLOMIDE 100 MG PO CAPS: 200.0000 mg/m2/d | ORAL_CAPSULE | Freq: Every day | ORAL | Status: DC

## 2012-12-03 MED ORDER — TEMOZOLOMIDE 100 MG PO CAPS: 200.0000 mg/m2/d | ORAL_CAPSULE | Freq: Every day | ORAL | Status: AC

## 2012-12-03 MED ORDER — CAPECITABINE 500 MG PO TABS: 1000.0000 mg/m2 | ORAL_TABLET | Freq: Two times a day (BID) | ORAL | Status: AC

## 2012-12-03 NOTE — Telephone Encounter (Signed)
Gave pt appt for lab and MD on October 2014 °

## 2012-12-03 NOTE — Progress Notes (Signed)
[facility] OFFICE PROGRESS NOTE  Ky Barban, MD [address] [place] [address] No chief complaint on file.  DIAGNOSIS: Metastatic well differentiated neuroendocrine tumor to the liver, ongoing chemotherapy with Xeloda and Temodar.  SUMMARY OF ONCOLOGIC HISTORY: I have reviewed his talk extensively and collaborated the history with the patient. In January of 2014 the patient was hospitalized for shortness of breath. He presented also with some epigastric pain and imaging studies review abnormal liver mass. On 04/15/2012 he underwent biopsy which showed well differentiated neuroendocrine tumor. Since February 2014, he was placed on palliative chemotherapy with Xeloda 1000 mg per meter squared twice a day for 2 weeks, off 2 weeks and Temodar 200 mg per meter squared daily on days 10-14, each cycle is a 28 day cycle. May 2014 CT scan show partial response. September 2014 repeat imaging study show continued response to treatment.  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 He has been off treatment since he saw oncologist 3 weeks ago. He is doing very well with chemotherapy with minimal side effects. His only complaint is occasional superficial ulceration at the foreskin of his penis for few days following chemotherapy. He is using over-the-counter antibiotic cream and lubricant and that seemed to help. Denies any mouth sores, nausea, diarrhea, or hand foot syndrome. He has no bleeding complications from anticoagulation therapy.  I have reviewed the past medical history, past surgical history, social history and family history with the patient and they are unchanged from previous note.  ALLERGIES:  has No Known Allergies.  MEDICATIONS: has a current medication list which includes the following prescription(s): docqlace, lisinopril-hydrochlorothiazide, metoprolol succinate, ondansetron, oxycodone, prochlorperazine, spironolactone, and  warfarin.  REVIEW OF SYSTEMS:   Constitutional: Denies fevers, chills or abnormal weight loss Eyes: Denies blurriness of vision Ears, nose, mouth, throat, and face: Denies mucositis or sore throat Respiratory: Denies cough, dyspnea or wheezes Cardiovascular: Denies palpitation, chest discomfort or lower extremity swelling Gastrointestinal:  Denies nausea, heartburn or change in bowel habits Skin: Denies abnormal skin rashes Lymphatics: Denies new lymphadenopathy or easy bruising Neurological:Denies numbness, tingling or new weaknesses Behavioral/Psych: Mood is stable, no new changes  All other systems were reviewed with the patient and are negative.  PHYSICAL EXAMINATION: ECOG PERFORMANCE STATUS: 0 - Asymptomatic  Filed Vitals:   12/03/12 1105  BP: 131/72  Pulse: 50  Temp: 97.8 F (36.6 C)  Resp: 20    GENERAL:alert, no distress and comfortable SKIN: skin color, texture, turgor are normal, no rashes or significant lesions EYES: normal, Conjunctiva are pink and non-injected, sclera clear OROPHARYNX:no exudate, no erythema and lips, buccal mucosa, and tongue normal  NECK: supple, thyroid normal size, non-tender, without nodularity LYMPH:  no palpable lymphadenopathy in the cervical, axillary or inguinal LUNGS: clear to auscultation and percussion with normal breathing effort HEART: regular rate & rhythm and no murmurs and no lower extremity edema ABDOMEN:abdomen soft, non-tender and normal bowel sounds Musculoskeletal:no cyanosis of digits and no clubbing  NEURO: alert & oriented x 3 with fluent speech, no focal motor/sensory deficits  LABORATORY DATA:  I have reviewed the data as listed    Component Value Date/Time   NA 138 11/18/2012 0826   NA 136 04/16/2012 1016   K 3.7 11/18/2012 0826   K 4.1 04/16/2012 1016   CL 105 08/24/2012 0849   CL 101 04/16/2012 1016   CO2 17* 11/18/2012 0826   CO2 25 04/16/2012 1016   GLUCOSE 133 11/18/2012  0826   GLUCOSE 100* 08/24/2012 0849   GLUCOSE  112* 04/16/2012 1016   BUN 18.7 11/18/2012 0826   BUN 21 04/16/2012 1016   CREATININE 1.3 11/18/2012 0826   CREATININE 1.09 04/16/2012 1016   CALCIUM 9.5 11/18/2012 0826   CALCIUM 9.3 04/16/2012 1016   PROT 7.7 11/18/2012 0826   PROT 7.1 04/16/2012 1016   ALBUMIN 3.7 11/18/2012 0826   ALBUMIN 2.7* 04/16/2012 1016   AST 19 11/18/2012 0826   AST 40* 04/16/2012 1016   ALT 18 11/18/2012 0826   ALT 35 04/16/2012 1016   ALKPHOS 62 11/18/2012 0826   ALKPHOS 203* 04/16/2012 1016   BILITOT 0.31 11/18/2012 0826   BILITOT 0.8 04/16/2012 1016   GFRNONAA 70* 04/16/2012 1016   GFRAA 81* 04/16/2012 1016    I No results found for this basename: SPEP, UPEP,  kappa and lambda light chains    Lab Results  Component Value Date   WBC 4.1 11/18/2012   NEUTROABS 2.3 11/18/2012   HGB 13.6 11/18/2012   HCT 38.9 11/18/2012   MCV 104.8* 11/18/2012   PLT 189 11/18/2012      Chemistry      Component Value Date/Time   NA 138 11/18/2012 0826   NA 136 04/16/2012 1016   K 3.7 11/18/2012 0826   K 4.1 04/16/2012 1016   CL 105 08/24/2012 0849   CL 101 04/16/2012 1016   CO2 17* 11/18/2012 0826   CO2 25 04/16/2012 1016   BUN 18.7 11/18/2012 0826   BUN 21 04/16/2012 1016   CREATININE 1.3 11/18/2012 0826   CREATININE 1.09 04/16/2012 1016      Component Value Date/Time   CALCIUM 9.5 11/18/2012 0826   CALCIUM 9.3 04/16/2012 1016   ALKPHOS 62 11/18/2012 0826   ALKPHOS 203* 04/16/2012 1016   AST 19 11/18/2012 0826   AST 40* 04/16/2012 1016   ALT 18 11/18/2012 0826   ALT 35 04/16/2012 1016   BILITOT 0.31 11/18/2012 0826   BILITOT 0.8 04/16/2012 1016       No results found for this basename: LABCA2    No components found with this basename: LABCA125    No results found for this basename: INR,  in the last 168 hours  Urinalysis    Component Value Date/Time   COLORURINE AMBER* 04/12/2012 2049   APPEARANCEUR CLOUDY* 04/12/2012 2049   LABSPEC 1.025 04/12/2012 2049   PHURINE 5.0 04/12/2012 2049   GLUCOSEU NEGATIVE 04/12/2012 2049   HGBUR SMALL* 04/12/2012 2049    BILIRUBINUR NEGATIVE 04/12/2012 2049   KETONESUR NEGATIVE 04/12/2012 2049   PROTEINUR 30* 04/12/2012 2049   UROBILINOGEN 1.0 04/12/2012 2049   NITRITE NEGATIVE 04/12/2012 2049   LEUKOCYTESUR TRACE* 04/12/2012 2049     RADIOGRAPHIC STUDIES: I have personally reviewed the radiological images as listed and agreed with the findings in the report. Ct Chest W Contrast  12/01/2012   CLINICAL DATA:  Metastatic neuroendocrine tumor in the liver diagnosed 8 months ago. Chemotherapy in progress.  EXAM: CT CHEST, ABDOMEN, AND PELVIS WITH CONTRAST  TECHNIQUE: Multidetector CT imaging of the chest, abdomen and pelvis was performed following the standard protocol during bolus administration of intravenous contrast.  CONTRAST:  OMNIPAQUE IOHEXOL 300 MG/ML  SOLN  COMPARISON:  Prior examinations 04/23/2012 and 07/22/2012.  FINDINGS: CT CHEST FINDINGS  There are no enlarged mediastinal or hilar lymph nodes. Calcified right paratracheal lymph nodes are stable. Left subclavian pacemaker and atherosclerosis of the aorta and coronary arteries is again noted.  There  is no pleural or pericardial effusion. Calcified right upper lobe granuloma on image 16 is stable. The lungs are otherwise clear. There is no endobronchial lesion or worrisome osseous finding.  CT ABDOMEN AND PELVIS FINDINGS  The abdominal portion of the study includes arterial, portal and delayed phase images through the liver. The remaining liver disease is best seen on the portal and delayed phase images. There is no significant arterial enhancement of the liver lesions. The dominant lesion involving the caudate lobe now measures 6.7 x 4.0 cm (image 88 of series 4)- previously 7.3 x 6.5 cm. The lesion adjacent to the gallbladder measures 2.1 cm on image 100 (previously 3.0 cm). The lesion in the dome of the right hepatic lobe is not significantly changed, measuring 3.1 cm on image 72. No enlarging lesions are identified.  Small mesenteric lymph nodes are stable.  There is no dominant mesenteric mass or calcification.  Splenic granulomas are stable. The gallbladder, pancreas and adrenal glands appear normal. The kidneys appear stable with fetal lobularity and scarring bilaterally. There is no hydronephrosis.  There is stable mild aortoiliac atherosclerosis without aneurysm. The stomach, small bowel, appendix and colon demonstrate no significant findings. The urinary bladder is incompletely distended, likely accounting for mild wall thickening. There is mild enlargement of the prostate gland.  Lower lumbar spondylosis appears stable. There are no worrisome osseous findings.  IMPRESSION: CT CHEST IMPRESSION  Stable chest CT. No evidence of metastatic disease.  CT ABDOMEN AND PELVIS IMPRESSION  Further improvement in hepatic metastatic disease. No progressive or acute abdominal pelvic findings demonstrated.   Electronically Signed   By: Roxy Horseman   On: 12/01/2012 15:11   Ct Abdomen Pelvis W Contrast  12/01/2012   CLINICAL DATA:  Metastatic neuroendocrine tumor in the liver diagnosed 8 months ago. Chemotherapy in progress.  EXAM: CT CHEST, ABDOMEN, AND PELVIS WITH CONTRAST  TECHNIQUE: Multidetector CT imaging of the chest, abdomen and pelvis was performed following the standard protocol during bolus administration of intravenous contrast.  CONTRAST:  OMNIPAQUE IOHEXOL 300 MG/ML  SOLN  COMPARISON:  Prior examinations 04/23/2012 and 07/22/2012.  FINDINGS: CT CHEST FINDINGS  There are no enlarged mediastinal or hilar lymph nodes. Calcified right paratracheal lymph nodes are stable. Left subclavian pacemaker and atherosclerosis of the aorta and coronary arteries is again noted.  There is no pleural or pericardial effusion. Calcified right upper lobe granuloma on image 16 is stable. The lungs are otherwise clear. There is no endobronchial lesion or worrisome osseous finding.  CT ABDOMEN AND PELVIS FINDINGS  The abdominal portion of the study includes arterial, portal and  delayed phase images through the liver. The remaining liver disease is best seen on the portal and delayed phase images. There is no significant arterial enhancement of the liver lesions. The dominant lesion involving the caudate lobe now measures 6.7 x 4.0 cm (image 88 of series 4)- previously 7.3 x 6.5 cm. The lesion adjacent to the gallbladder measures 2.1 cm on image 100 (previously 3.0 cm). The lesion in the dome of the right hepatic lobe is not significantly changed, measuring 3.1 cm on image 72. No enlarging lesions are identified.  Small mesenteric lymph nodes are stable. There is no dominant mesenteric mass or calcification.  Splenic granulomas are stable. The gallbladder, pancreas and adrenal glands appear normal. The kidneys appear stable with fetal lobularity and scarring bilaterally. There is no hydronephrosis.  There is stable mild aortoiliac atherosclerosis without aneurysm. The stomach, small bowel, appendix and colon demonstrate  no significant findings. The urinary bladder is incompletely distended, likely accounting for mild wall thickening. There is mild enlargement of the prostate gland.  Lower lumbar spondylosis appears stable. There are no worrisome osseous findings.  IMPRESSION: CT CHEST IMPRESSION  Stable chest CT. No evidence of metastatic disease.  CT ABDOMEN AND PELVIS IMPRESSION  Further improvement in hepatic metastatic disease. No progressive or acute abdominal pelvic findings demonstrated.   Electronically Signed   By: Roxy Horseman   On: 12/01/2012 15:11   I reviewed the imaging study with the patient and his family.  ASSESSMENT: Metastatic well differentiated neuroendocrine tumor to the liver with good response to treatment   PLAN:  #1 well differentiated neuroendocrine tumor He tolerated treatment very well. He has been off treatment for almost 4 weeks. I recommend he restart his next cycle of treatment on 12/07/2012. We will continue repeating imaging study every 4-6 months  and continued chemotherapy indefinitely unless he developed uncontrolled side effects. #2 history of stroke and cardiomyopathy We will continue warfarin and his PCP will monitor his PT INR closely due to potential interaction with Xeloda #3 intermittent penile ulceration He will continue over-the-counter lubricant as needed  All questions were answered. The patient knows to call the clinic with any problems, questions or concerns. We can certainly see the patient much sooner if necessary. No barriers to learning was detected.  The patient and plan discussed with [facility], Shevelle Smither  and he is in agreement with the aforementioned.  I spent 40 minutes counseling the patient face to face. The total time spent in the appointment was 60 minutes and more than 50% was on counseling.     Rodolphe Edmonston, MD 12/03/2012 11:49 AM

## 2012-12-07 ENCOUNTER — Ambulatory Visit (INDEPENDENT_AMBULATORY_CARE_PROVIDER_SITE_OTHER): Payer: Medicaid Other | Admitting: Pharmacist

## 2012-12-07 DIAGNOSIS — I639 Cerebral infarction, unspecified: Secondary | ICD-10-CM

## 2012-12-07 DIAGNOSIS — Z7901 Long term (current) use of anticoagulants: Secondary | ICD-10-CM

## 2012-12-07 DIAGNOSIS — I6350 Cerebral infarction due to unspecified occlusion or stenosis of unspecified cerebral artery: Secondary | ICD-10-CM

## 2012-12-07 NOTE — Patient Instructions (Signed)
Patient instructed to take medications as defined in the Anti-coagulation Track section of this encounter.  Patient instructed to take today's dose.  Patient verbalized understanding of these instructions.    

## 2012-12-07 NOTE — Progress Notes (Signed)
Anti-Coagulation Progress Note  Jonathan Reed is a 67 y.o. male who is currently on an anti-coagulation regimen.    RECENT RESULTS: Recent results are below, the most recent result is correlated with a dose of 45 mg. per week: Lab Results  Component Value Date   INR 2.10 12/07/2012   INR 2.90 11/23/2012   INR 2.0 11/09/2012   PROTIME 30.0* 05/15/2012    ANTI-COAG DOSE: Anticoagulation Dose Instructions as of 12/07/2012     [name] Tue Wed Thu Fri Sat   New Dose 2.5 mg 5 mg 5 mg 5 mg 2.5 mg 5 mg 2.5 mg       ANTICOAG SUMMARY: Anticoagulation Episode Summary   Current INR goal 2.0-3.0  Next INR check 12/21/2012  INR from last check 2.10 (12/07/2012)  Weekly max dose   Target end date Indefinite  [facility] check [facility]  Preferred lab   Send INR reminders to    Indications  CVA (cerebral infarction) [434.91] Long term (current) use of anticoagulants [V58.61]        Comments         ANTICOAG TODAY: Anticoagulation Summary as of 12/07/2012   INR goal 2.0-3.0  Selected INR 2.10 (12/07/2012)  Next INR check 12/21/2012  Target end date Indefinite   Indications  CVA (cerebral infarction) [434.91] Long term (current) use of anticoagulants [V58.61]      Anticoagulation Episode Summary   [facility] check [facility]   Preferred lab    Send INR reminders to    Comments       PATIENT INSTRUCTIONS: Patient Instructions  Patient instructed to take medications as defined in the Anti-coagulation Track section of this encounter.  Patient instructed to take today's dose.  Patient verbalized understanding of these instructions.       FOLLOW-UP Return in 2 weeks (on 12/21/2012) for Follow up INR at 3:15PM.  [name], III Pharm.D., CACP

## 2012-12-16 ENCOUNTER — Ambulatory Visit: Payer: Medicaid Other | Admitting: Oncology

## 2012-12-16 ENCOUNTER — Other Ambulatory Visit: Payer: Medicaid Other | Admitting: Lab

## 2012-12-21 ENCOUNTER — Ambulatory Visit (INDEPENDENT_AMBULATORY_CARE_PROVIDER_SITE_OTHER): Payer: Medicaid Other | Admitting: Pharmacist

## 2012-12-21 DIAGNOSIS — Z7901 Long term (current) use of anticoagulants: Secondary | ICD-10-CM

## 2012-12-21 DIAGNOSIS — I639 Cerebral infarction, unspecified: Secondary | ICD-10-CM

## 2012-12-21 DIAGNOSIS — I6350 Cerebral infarction due to unspecified occlusion or stenosis of unspecified cerebral artery: Secondary | ICD-10-CM

## 2012-12-21 NOTE — Patient Instructions (Addendum)
Patient instructed to take medications as defined in the Anti-coagulation Track section of this encounter.  Patient instructed to take today's dose.  Patient verbalized understanding of these instructions.    

## 2012-12-21 NOTE — Progress Notes (Signed)
Patient instructed to take medications as defined in the Anti-coagulation Track section of this encounter.  Patient instructed to take today's dose.  Patient verbalized understanding of these instructions.    

## 2012-12-21 NOTE — Progress Notes (Signed)
Anti-Coagulation Progress Note  Jonathan Reed is a 67 y.o. male who is currently on an anti-coagulation regimen.    RECENT RESULTS: Recent results are below, the most recent result is correlated with a dose of 25 mg. per week: Lab Results  Component Value Date   INR 2.10 12/07/2012   INR 2.90 11/23/2012   INR 2.0 11/09/2012   PROTIME 30.0* 05/15/2012    ANTI-COAG DOSE:    ANTICOAG SUMMARY: Anticoagulation Episode Summary   Current INR goal 2.0-3.0  Next INR check 12/21/2012  INR from last check 2.10 (12/07/2012)  Weekly max dose   Target end date Indefinite  [facility]  Preferred lab   Send INR reminders to    Indications  CVA (cerebral infarction) [434.91] Long term (current) use of anticoagulants [V58.61]        Comments         ANTICOAG TODAY: Anticoagulation Summary as of 12/21/2012   INR goal 2.0-3.0  Selected INR   Next INR check   Target end date Indefinite   Indications  CVA (cerebral infarction) [434.91] Long term (current) use of anticoagulants [V58.61]      Anticoagulation Episode Summary   [facility] check [facility]   Preferred lab    Send INR reminders to    Comments       PATIENT INSTRUCTIONS: Patient Instructions  Patient instructed to take medications as defined in the Anti-coagulation Track section of this encounter.  Patient instructed to take today's dose.  Patient verbalized understanding of these instructions.       FOLLOW-UP Return in 2 weeks (on 01/04/2013) for Follow up INR at 2:45PM.  [name], III Pharm.D., CACP

## 2012-12-22 NOTE — Progress Notes (Signed)
Indication: Clinician concern about high risk for recurrent CVA. Duration: Lifelong per clinician decision. INR: At target. Agree with Dr. Groce's assessment and plan. 

## 2013-01-04 ENCOUNTER — Ambulatory Visit (INDEPENDENT_AMBULATORY_CARE_PROVIDER_SITE_OTHER): Payer: Medicaid Other | Admitting: Pharmacist

## 2013-01-04 DIAGNOSIS — Z7901 Long term (current) use of anticoagulants: Secondary | ICD-10-CM

## 2013-01-04 DIAGNOSIS — I639 Cerebral infarction, unspecified: Secondary | ICD-10-CM

## 2013-01-04 DIAGNOSIS — I6350 Cerebral infarction due to unspecified occlusion or stenosis of unspecified cerebral artery: Secondary | ICD-10-CM

## 2013-01-04 NOTE — Progress Notes (Signed)
Anti-Coagulation Progress Note  Jonathan Reed is a 67 y.o. male who is currently on an anti-coagulation regimen.    RECENT RESULTS: Recent results are below, the most recent result is correlated with a dose of 20 mg. per week: Lab Results  Component Value Date   INR 3.50 01/04/2013   INR 2.10 12/07/2012   INR 2.90 11/23/2012   PROTIME 30.0* 05/15/2012    ANTI-COAG DOSE: Anticoagulation Dose Instructions as of 01/04/2013     [name] Tue Wed Thu Fri Sat   New Dose 2.5 mg 2.5 mg 2.5 mg 5 mg 2.5 mg 2.5 mg 2.5 mg       ANTICOAG SUMMARY: Anticoagulation Episode Summary   Current INR goal 2.0-3.0  Next INR check 01/25/2013  INR from last check 3.50! (01/04/2013)  Weekly max dose   Target end date Indefinite  [facility]  Preferred lab   Send INR reminders to    Indications  CVA (cerebral infarction) [434.91] Long term (current) use of anticoagulants [V58.61]        Comments         ANTICOAG TODAY: Anticoagulation Summary as of 01/04/2013   INR goal 2.0-3.0  Selected INR 3.50! (01/04/2013)  Next INR check 01/25/2013  Target end date Indefinite   Indications  CVA (cerebral infarction) [434.91] Long term (current) use of anticoagulants [V58.61]      Anticoagulation Episode Summary   [facility]   Preferred lab    Send INR reminders to    Comments       PATIENT INSTRUCTIONS: Patient Instructions  Patient instructed to take medications as defined in the Anti-coagulation Track section of this encounter.  Patient instructed to take today's dose.  Patient verbalized understanding of these instructions.       FOLLOW-UP Return in about 3 weeks (around 01/25/2013) for Follow up INR at 3:15PM.  [name], III Pharm.D., CACP

## 2013-01-04 NOTE — Patient Instructions (Signed)
Patient instructed to take medications as defined in the Anti-coagulation Track section of this encounter.  Patient instructed to take today's dose.  Patient verbalized understanding of these instructions.    

## 2013-01-05 NOTE — Progress Notes (Signed)
Indication: Clinician concern about high risk for recurrent CVA. Duration: Lifelong per clinician decision. INR: Above target. Agree with Dr. Saralyn Pilar assessment and plan.

## 2013-01-06 ENCOUNTER — Other Ambulatory Visit (HOSPITAL_BASED_OUTPATIENT_CLINIC_OR_DEPARTMENT_OTHER): Payer: Medicaid Other | Admitting: Lab

## 2013-01-06 ENCOUNTER — Telehealth: Payer: Self-pay | Admitting: Oncology

## 2013-01-06 ENCOUNTER — Ambulatory Visit (HOSPITAL_BASED_OUTPATIENT_CLINIC_OR_DEPARTMENT_OTHER): Payer: Medicaid Other | Admitting: Hematology and Oncology

## 2013-01-06 ENCOUNTER — Encounter: Payer: Self-pay | Admitting: Hematology and Oncology

## 2013-01-06 VITALS — BP 122/69 | HR 53 | Temp 97.6°F | Resp 20 | Ht 70.0 in | Wt 209.8 lb

## 2013-01-06 DIAGNOSIS — C7A00 Malignant carcinoid tumor of unspecified site: Secondary | ICD-10-CM

## 2013-01-06 DIAGNOSIS — D3A00 Benign carcinoid tumor of unspecified site: Secondary | ICD-10-CM

## 2013-01-06 DIAGNOSIS — C7B8 Other secondary neuroendocrine tumors: Secondary | ICD-10-CM

## 2013-01-06 DIAGNOSIS — R110 Nausea: Secondary | ICD-10-CM

## 2013-01-06 DIAGNOSIS — C787 Secondary malignant neoplasm of liver and intrahepatic bile duct: Secondary | ICD-10-CM

## 2013-01-06 DIAGNOSIS — R109 Unspecified abdominal pain: Secondary | ICD-10-CM

## 2013-01-06 DIAGNOSIS — D72819 Decreased white blood cell count, unspecified: Secondary | ICD-10-CM

## 2013-01-06 HISTORY — DX: Nausea: R11.0

## 2013-01-06 HISTORY — DX: Unspecified abdominal pain: R10.9

## 2013-01-06 LAB — CBC WITH DIFFERENTIAL/PLATELET
Basophils Absolute: 0 10*3/uL (ref 0.0–0.1)
Eosinophils Absolute: 0.1 10*3/uL (ref 0.0–0.5)
HGB: 13.1 g/dL (ref 13.0–17.1)
LYMPH%: 28.8 % (ref 14.0–49.0)
MCV: 102.6 fL — ABNORMAL HIGH (ref 79.3–98.0)
MONO#: 0.6 10*3/uL (ref 0.1–0.9)
NEUT#: 2 10*3/uL (ref 1.5–6.5)
Platelets: 148 10*3/uL (ref 140–400)
RBC: 3.72 10*6/uL — ABNORMAL LOW (ref 4.20–5.82)
WBC: 3.7 10*3/uL — ABNORMAL LOW (ref 4.0–10.3)

## 2013-01-06 LAB — COMPREHENSIVE METABOLIC PANEL (CC13)
Albumin: 3.5 g/dL (ref 3.5–5.0)
Anion Gap: 9 mEq/L (ref 3–11)
BUN: 14 mg/dL (ref 7.0–26.0)
CO2: 24 mEq/L (ref 22–29)
Glucose: 97 mg/dl (ref 70–140)
Potassium: 3.8 mEq/L (ref 3.5–5.1)
Sodium: 141 mEq/L (ref 136–145)
Total Bilirubin: 0.49 mg/dL (ref 0.20–1.20)
Total Protein: 7.2 g/dL (ref 6.4–8.3)

## 2013-01-06 MED ORDER — ONDANSETRON HCL 4 MG PO TABS: 4.0000 mg | ORAL_TABLET | Freq: Two times a day (BID) | ORAL | Status: DC | PRN

## 2013-01-06 MED ORDER — OXYCODONE HCL 5 MG PO TABS: 5.0000 mg | ORAL_TABLET | Freq: Four times a day (QID) | ORAL | Status: DC | PRN

## 2013-01-06 NOTE — Progress Notes (Signed)
[facility] OFFICE PROGRESS NOTE  Patient Care Team: Ky Barban, MD as PCP - General (Internal Medicine) Loyce Dys as Referring Physician (Surgery)  DIAGNOSIS: Metastatic well differentiated neuroendocrine tumor to the liver, ongoing chemotherapy with Xeloda and Temodar.  SUMMARY OF ONCOLOGIC HISTORY: In January of 2014 the patient was hospitalized for shortness of breath. He presented also with some epigastric pain and imaging studies review abnormal liver mass. On 04/15/2012 he underwent biopsy which showed well differentiated neuroendocrine tumor. Since February 2014, he was placed on palliative chemotherapy with Xeloda 1000 mg per meter squared twice a day for 2 weeks, off 2 weeks and Temodar 200 mg per meter squared daily on days 10-14, each cycle is a 28 day cycle. May 2014 CT scan show partial response. September 2014 repeat imaging study show continued response to treatment.  INTERVAL HISTORY: Jonathan Reed 67 y.o. male returns for further followup. He denies any recent fever, chills, night sweats or abnormal weight loss The patient denies any mouth sores, nausea, vomiting or change in bowel habits The patient had occasional abdomen no pain and he takes oxycodone as needed. He also takes Zofran regularly before chemotherapy to prevent nausea. He has no new vomiting.  I have reviewed the past medical history, past surgical history, social history and family history with the patient and they are unchanged from previous note.  ALLERGIES:  has No Known Allergies.  MEDICATIONS:  Current Outpatient Prescriptions  Medication Sig Dispense Refill  . capecitabine (XELODA) 500 MG tablet Take 2,000 mg by mouth 2 (two) times daily after a meal. Take twice daily for 14 days on and then 14 days off each cycle.      . DOCQLACE 100 MG capsule TAKE ONE CAPSULE BY MOUTH EVERY DAY  30 capsule  0  . lisinopril-hydrochlorothiazide (PRINZIDE,ZESTORETIC) 20-25 MG per tablet Take  0.5 tablets by mouth daily.  90 tablet  2  . metoprolol succinate (TOPROL-XL) 25 MG 24 hr tablet Take 1 tablet (25 mg total) by mouth daily.  90 tablet  3  . ondansetron (ZOFRAN) 4 MG tablet Take 1 tablet (4 mg total) by mouth every 12 (twelve) hours as needed for nausea.  60 tablet  3  . oxyCODONE (ROXICODONE) 5 MG immediate release tablet Take 1 tablet (5 mg total) by mouth every 6 (six) hours as needed for pain.  30 tablet  0  . prochlorperazine (COMPAZINE) 10 MG tablet Take 1 tablet (10 mg total) by mouth every 6 (six) hours as needed.  30 tablet  3  . spironolactone (ALDACTONE) 25 MG tablet Take 25 mg by mouth daily.      Marland Kitchen temozolomide (TEMODAR) 100 MG capsule Take 400 mg by mouth daily. May take on an empty stomach or at bedtime to decrease nausea & vomiting.  Take on days 10 thru 14 of Xeloda.      . warfarin (COUMADIN) 5 MG tablet TAKE AS DIRECTED  51 tablet  0   No current facility-administered medications for this visit.    REVIEW OF SYSTEMS:   Constitutional: Denies fevers, chills or abnormal weight loss Eyes: Denies blurriness of vision Ears, nose, mouth, throat, and face: Denies mucositis or sore throat Respiratory: Denies cough, dyspnea or wheezes Cardiovascular: Denies palpitation, chest discomfort or lower extremity swelling Gastrointestinal:  Denies nausea, heartburn or change in bowel habits Skin: Denies abnormal skin rashes Lymphatics: Denies new lymphadenopathy or easy bruising Neurological:Denies numbness, tingling or new weaknesses Behavioral/Psych: Mood is stable, no new  changes  All other systems were reviewed with the patient and are negative.  PHYSICAL EXAMINATION: ECOG PERFORMANCE STATUS: 0 - Asymptomatic  Filed Vitals:   01/06/13 1416  BP: 122/69  Pulse: 53  Temp: 97.6 F (36.4 C)  Resp: 20   Filed Weights   01/06/13 1416  Weight: 209 lb 12.8 oz (95.165 kg)    GENERAL:alert, no distress and comfortable SKIN: skin color, texture, turgor are  normal, no rashes or significant lesions EYES: normal, Conjunctiva are pink and non-injected, sclera clear OROPHARYNX:no exudate, no erythema and lips, buccal mucosa, and tongue normal  NECK: supple, thyroid normal size, non-tender, without nodularity LYMPH:  no palpable lymphadenopathy in the cervical, axillary or inguinal LUNGS: clear to auscultation and percussion with normal breathing effort HEART: regular rate & rhythm and no murmurs and no lower extremity edema ABDOMEN:abdomen soft, non-tender and normal bowel sounds Musculoskeletal:no cyanosis of digits and no clubbing  NEURO: alert & oriented x 3 with fluent speech, no focal motor/sensory deficits  LABORATORY DATA:  I have reviewed the data as listed    Component Value Date/Time   NA 141 01/06/2013 1402   NA 136 04/16/2012 1016   K 3.8 01/06/2013 1402   K 4.1 04/16/2012 1016   CL 105 08/24/2012 0849   CL 101 04/16/2012 1016   CO2 24 01/06/2013 1402   CO2 25 04/16/2012 1016   GLUCOSE 97 01/06/2013 1402   GLUCOSE 100* 08/24/2012 0849   GLUCOSE 112* 04/16/2012 1016   BUN 14.0 01/06/2013 1402   BUN 21 04/16/2012 1016   CREATININE 1.3 01/06/2013 1402   CREATININE 1.09 04/16/2012 1016   CALCIUM 9.6 01/06/2013 1402   CALCIUM 9.3 04/16/2012 1016   PROT 7.2 01/06/2013 1402   PROT 7.1 04/16/2012 1016   ALBUMIN 3.5 01/06/2013 1402   ALBUMIN 2.7* 04/16/2012 1016   AST 20 01/06/2013 1402   AST 40* 04/16/2012 1016   ALT 19 01/06/2013 1402   ALT 35 04/16/2012 1016   ALKPHOS 63 01/06/2013 1402   ALKPHOS 203* 04/16/2012 1016   BILITOT 0.49 01/06/2013 1402   BILITOT 0.8 04/16/2012 1016   GFRNONAA 70* 04/16/2012 1016   GFRAA 81* 04/16/2012 1016    No results found for this basename: SPEP,  UPEP,   kappa and lambda light chains    Lab Results  Component Value Date   WBC 3.7* 01/06/2013   NEUTROABS 2.0 01/06/2013   HGB 13.1 01/06/2013   HCT 38.2* 01/06/2013   MCV 102.6* 01/06/2013   PLT 148 01/06/2013      Chemistry      Component Value  Date/Time   NA 141 01/06/2013 1402   NA 136 04/16/2012 1016   K 3.8 01/06/2013 1402   K 4.1 04/16/2012 1016   CL 105 08/24/2012 0849   CL 101 04/16/2012 1016   CO2 24 01/06/2013 1402   CO2 25 04/16/2012 1016   BUN 14.0 01/06/2013 1402   BUN 21 04/16/2012 1016   CREATININE 1.3 01/06/2013 1402   CREATININE 1.09 04/16/2012 1016      Component Value Date/Time   CALCIUM 9.6 01/06/2013 1402   CALCIUM 9.3 04/16/2012 1016   ALKPHOS 63 01/06/2013 1402   ALKPHOS 203* 04/16/2012 1016   AST 20 01/06/2013 1402   AST 40* 04/16/2012 1016   ALT 19 01/06/2013 1402   ALT 35 04/16/2012 1016   BILITOT 0.49 01/06/2013 1402   BILITOT 0.8 04/16/2012 1016     ASSESSMENT:  Metastatic neuroendocrine tumor  PLAN:  #  1 metastatic neuroendocrine tumor The patient is responding well to treatment. He has minimal side effects at this point in time. We'll continue treatment as scheduled and his next imaging study will be due in December 2014 #2 history of stroke and cardiomyopathy He continue anticoagulation therapy. The patient has no evidence of bleeding #3 chronic nausea I refilled his prescription Zofran today. #4 Abdominal pain I refill a small prescription of oxycodone to take as needed. #5 leukopenia This is likely due to recent treatment. The patient denies recent history of fevers, cough, chills, diarrhea or dysuria. He is asymptomatic from the leukopenia. I will observe for now.  I will continue the chemotherapy at current dose without dosage adjustment.  If the leukopenia gets progressive worse in the future, I might have to delay his treatment or adjust the chemotherapy dose.  Orders Placed This Encounter  Procedures  . CBC with Differential    Standing Status: Future     Number of Occurrences:      Standing Expiration Date: 09/28/2013  . Comprehensive metabolic panel    Standing Status: Future     Number of Occurrences:      Standing Expiration Date: 01/06/2014   All questions were answered. The  patient knows to call the clinic with any problems, questions or concerns. No barriers to learning was detected. I spent 25 minutes counseling the patient face to face. The total time spent in the appointment was 40 minutes and more than 50% was on counseling and review of test results     [facility], Atlantis Delong, MD 01/06/2013 3:49 PM

## 2013-01-06 NOTE — Telephone Encounter (Signed)
Gave pt apt for lab and MD for November 2014

## 2013-01-20 ENCOUNTER — Other Ambulatory Visit: Payer: Medicaid Other | Admitting: Lab

## 2013-01-20 ENCOUNTER — Ambulatory Visit: Payer: Medicaid Other

## 2013-01-21 ENCOUNTER — Other Ambulatory Visit: Payer: Self-pay

## 2013-01-25 ENCOUNTER — Ambulatory Visit (INDEPENDENT_AMBULATORY_CARE_PROVIDER_SITE_OTHER): Payer: Medicaid Other | Admitting: Pharmacist

## 2013-01-25 DIAGNOSIS — I639 Cerebral infarction, unspecified: Secondary | ICD-10-CM

## 2013-01-25 DIAGNOSIS — I6350 Cerebral infarction due to unspecified occlusion or stenosis of unspecified cerebral artery: Secondary | ICD-10-CM

## 2013-01-25 DIAGNOSIS — Z7901 Long term (current) use of anticoagulants: Secondary | ICD-10-CM

## 2013-01-25 LAB — POCT INR: INR: 2.6

## 2013-01-25 NOTE — Progress Notes (Signed)
Anti-Coagulation Progress Note  Jonathan Reed is a 67 y.o. male who is currently on an anti-coagulation regimen.    RECENT RESULTS: Recent results are below, the most recent result is correlated with a dose of 20 mg. per week: Lab Results  Component Value Date   INR 2.6 01/25/2013   INR 3.50 01/04/2013   INR 2.10 12/07/2012   PROTIME 30.0* 05/15/2012    ANTI-COAG DOSE: Anticoagulation Dose Instructions as of 01/25/2013     [name] Tue Wed Thu Fri Sat   New Dose 2.5 mg 5 mg 2.5 mg 2.5 mg 5 mg 2.5 mg 2.5 mg       ANTICOAG SUMMARY: Anticoagulation Episode Summary   Current INR goal 2.0-3.0  Next INR check 02/15/2013  INR from last check 2.6 (01/25/2013)  Weekly max dose   Target end date Indefinite  [facility]  Preferred lab   Send INR reminders to    Indications  CVA (cerebral infarction) [434.91] Long term (current) use of anticoagulants [V58.61]        Comments         ANTICOAG TODAY: Anticoagulation Summary as of 01/25/2013   INR goal 2.0-3.0  Selected INR 2.6 (01/25/2013)  Next INR check 02/15/2013  Target end date Indefinite   Indications  CVA (cerebral infarction) [434.91] Long term (current) use of anticoagulants [V58.61]      Anticoagulation Episode Summary   [facility]   Preferred lab    Send INR reminders to    Comments       PATIENT INSTRUCTIONS: Patient Instructions  Patient instructed to take medications as defined in the Anti-coagulation Track section of this encounter.  Patient instructed to take today's dose.  Patient verbalized understanding of these instructions.       FOLLOW-UP Return in 3 weeks (on 02/15/2013) for Follow up INR on 02/15/13 at 3:15PM.  [name], III Pharm.D., CACP

## 2013-01-25 NOTE — Patient Instructions (Signed)
Patient instructed to take medications as defined in the Anti-coagulation Track section of this encounter.  Patient instructed to take today's dose.  Patient verbalized understanding of these instructions.    

## 2013-01-26 NOTE — Progress Notes (Signed)
Indication: Clinician concern about high risk for recurrent CVA. Duration: Lifelong per clinician decision. INR: At target. Agree with Dr. Groce's assessment and plan. 

## 2013-02-03 ENCOUNTER — Telehealth: Payer: Self-pay | Admitting: Hematology and Oncology

## 2013-02-03 NOTE — Telephone Encounter (Signed)
Pt's wife called left message asking why meds cant be approve, left message with nurse regarding call, pt's wife did not mention name of meds.

## 2013-02-05 ENCOUNTER — Ambulatory Visit (HOSPITAL_BASED_OUTPATIENT_CLINIC_OR_DEPARTMENT_OTHER): Payer: Medicaid Other | Admitting: Hematology and Oncology

## 2013-02-05 ENCOUNTER — Telehealth: Payer: Self-pay | Admitting: Hematology and Oncology

## 2013-02-05 ENCOUNTER — Other Ambulatory Visit (HOSPITAL_BASED_OUTPATIENT_CLINIC_OR_DEPARTMENT_OTHER): Payer: Medicaid Other

## 2013-02-05 ENCOUNTER — Other Ambulatory Visit: Payer: Self-pay | Admitting: Hematology and Oncology

## 2013-02-05 VITALS — BP 111/75 | HR 52 | Temp 97.5°F | Resp 20 | Ht 70.0 in | Wt 207.5 lb

## 2013-02-05 DIAGNOSIS — D696 Thrombocytopenia, unspecified: Secondary | ICD-10-CM

## 2013-02-05 DIAGNOSIS — C7B8 Other secondary neuroendocrine tumors: Secondary | ICD-10-CM

## 2013-02-05 DIAGNOSIS — C7A00 Malignant carcinoid tumor of unspecified site: Secondary | ICD-10-CM

## 2013-02-05 DIAGNOSIS — C787 Secondary malignant neoplasm of liver and intrahepatic bile duct: Secondary | ICD-10-CM

## 2013-02-05 DIAGNOSIS — R110 Nausea: Secondary | ICD-10-CM

## 2013-02-05 LAB — CBC WITH DIFFERENTIAL/PLATELET
BASO%: 1.1 % (ref 0.0–2.0)
Basophils Absolute: 0 10*3/uL (ref 0.0–0.1)
EOS%: 2 % (ref 0.0–7.0)
HGB: 14.3 g/dL (ref 13.0–17.1)
MCH: 35 pg — ABNORMAL HIGH (ref 27.2–33.4)
MCHC: 33.6 g/dL (ref 32.0–36.0)
MCV: 104.2 fL — ABNORMAL HIGH (ref 79.3–98.0)
MONO%: 15.6 % — ABNORMAL HIGH (ref 0.0–14.0)
RBC: 4.08 10*6/uL — ABNORMAL LOW (ref 4.20–5.82)
RDW: 15.7 % — ABNORMAL HIGH (ref 11.0–14.6)
WBC: 3.7 10*3/uL — ABNORMAL LOW (ref 4.0–10.3)
lymph#: 1.3 10*3/uL (ref 0.9–3.3)

## 2013-02-05 LAB — COMPREHENSIVE METABOLIC PANEL (CC13)
ALT: 16 U/L (ref 0–55)
AST: 19 U/L (ref 5–34)
Albumin: 3.8 g/dL (ref 3.5–5.0)
Alkaline Phosphatase: 61 U/L (ref 40–150)
CO2: 26 mEq/L (ref 22–29)
Calcium: 10 mg/dL (ref 8.4–10.4)
Chloride: 105 mEq/L (ref 98–109)
Potassium: 3.6 mEq/L (ref 3.5–5.1)
Sodium: 141 mEq/L (ref 136–145)
Total Bilirubin: 0.6 mg/dL (ref 0.20–1.20)
Total Protein: 7.5 g/dL (ref 6.4–8.3)

## 2013-02-05 NOTE — Telephone Encounter (Signed)
11/21 POF worked appts made AVS and CAL given shh

## 2013-02-05 NOTE — Progress Notes (Signed)
[facility] OFFICE PROGRESS NOTE  Patient Care Team: Ky Barban, MD as PCP - General (Internal Medicine) Loyce Dys as Referring Physician (Surgery) Artis Delay, MD as Consulting Physician (Hematology and Oncology)  DIAGNOSIS: Metastatic, well-differentiated neuroendocrine tumor to the liver, ongoing chemotherapy with Xeloda and Temodar  SUMMARY OF ONCOLOGIC HISTORY: In January of 2014 the patient was hospitalized for shortness of breath. He presented also with some epigastric pain and imaging studies review abnormal liver mass.  On 04/15/2012 he underwent biopsy which showed well differentiated neuroendocrine tumor.  Since February 2014, he was placed on palliative chemotherapy with Xeloda 1000 mg per meter squared twice a day for 2 weeks, off 2 weeks and Temodar 200 mg per meter squared daily on days 10-14, each cycle is a 28 day cycle.  May 2014 CT scan show partial response.  September 2014 repeat imaging study show continued response to treatment.  INTERVAL HISTORY: Jonathan Reed 67 y.o. male returns for further followup. He denies any recent fever, chills, night sweats or abnormal weight loss The patient denies any mouth sores, nausea, vomiting or change in bowel habits He denies any side effects from treatment. He has minimum pain in his abdomen but he takes oxycodone very readily. He is currently on anticoagulation therapy long-term for history of stroke.The patient denies any recent signs or symptoms of bleeding such as spontaneous epistaxis, hematuria or hematochezia.  I have reviewed the past medical history, past surgical history, social history and family history with the patient and they are unchanged from previous note.  ALLERGIES:  has No Known Allergies.  MEDICATIONS:  Current Outpatient Prescriptions  Medication Sig Dispense Refill  . capecitabine (XELODA) 500 MG tablet Take 2,000 mg by mouth 2 (two) times daily after a meal. Take twice daily  for 14 days on and then 14 days off each cycle.      . DOCQLACE 100 MG capsule TAKE ONE CAPSULE BY MOUTH EVERY DAY  30 capsule  0  . lisinopril-hydrochlorothiazide (PRINZIDE,ZESTORETIC) 20-25 MG per tablet Take 0.5 tablets by mouth daily.  90 tablet  2  . metoprolol succinate (TOPROL-XL) 25 MG 24 hr tablet Take 1 tablet (25 mg total) by mouth daily.  90 tablet  3  . ondansetron (ZOFRAN) 4 MG tablet Take 1 tablet (4 mg total) by mouth every 12 (twelve) hours as needed for nausea.  60 tablet  3  . oxyCODONE (ROXICODONE) 5 MG immediate release tablet Take 1 tablet (5 mg total) by mouth every 6 (six) hours as needed for pain.  30 tablet  0  . prochlorperazine (COMPAZINE) 10 MG tablet Take 1 tablet (10 mg total) by mouth every 6 (six) hours as needed.  30 tablet  3  . spironolactone (ALDACTONE) 25 MG tablet Take 25 mg by mouth daily.      Marland Kitchen temozolomide (TEMODAR) 100 MG capsule Take 400 mg by mouth daily. May take on an empty stomach or at bedtime to decrease nausea & vomiting.  Take on days 10 thru 14 of Xeloda.      . warfarin (COUMADIN) 5 MG tablet TAKE AS DIRECTED  51 tablet  0   No current facility-administered medications for this visit.    REVIEW OF SYSTEMS:   Constitutional: Denies fevers, chills or abnormal weight loss Eyes: Denies blurriness of vision Ears, nose, mouth, throat, and face: Denies mucositis or sore throat Respiratory: Denies cough, dyspnea or wheezes Cardiovascular: Denies palpitation, chest discomfort or lower extremity swelling Gastrointestinal:  Denies  nausea, heartburn or change in bowel habits Skin: Denies abnormal skin rashes Lymphatics: Denies new lymphadenopathy or easy bruising Neurological:Denies numbness, tingling or new weaknesses Behavioral/Psych: Mood is stable, no new changes  All other systems were reviewed with the patient and are negative.  PHYSICAL EXAMINATION: ECOG PERFORMANCE STATUS: 0 - Asymptomatic  Filed Vitals:   02/05/13 1428  BP: 111/75   Pulse: 52  Temp: 97.5 F (36.4 C)  Resp: 20   Filed Weights   02/05/13 1428  Weight: 207 lb 8 oz (94.121 kg)    GENERAL:alert, no distress and comfortable SKIN: skin color, texture, turgor are normal, no rashes or significant lesions EYES: normal, Conjunctiva are pink and non-injected, sclera clear OROPHARYNX:no exudate, no erythema and lips, buccal mucosa, and tongue normal  NECK: supple, thyroid normal size, non-tender, without nodularity LYMPH:  no palpable lymphadenopathy in the cervical, axillary or inguinal LUNGS: clear to auscultation and percussion with normal breathing effort HEART: regular rate & rhythm and no murmurs and no lower extremity edema ABDOMEN:abdomen soft, non-tender and normal bowel sounds Musculoskeletal:no cyanosis of digits and no clubbing  NEURO: alert & oriented x 3 with fluent speech, no focal motor/sensory deficits  LABORATORY DATA:  I have reviewed the data as listed    Component Value Date/Time   NA 141 01/06/2013 1402   NA 136 04/16/2012 1016   K 3.8 01/06/2013 1402   K 4.1 04/16/2012 1016   CL 105 08/24/2012 0849   CL 101 04/16/2012 1016   CO2 24 01/06/2013 1402   CO2 25 04/16/2012 1016   GLUCOSE 97 01/06/2013 1402   GLUCOSE 100* 08/24/2012 0849   GLUCOSE 112* 04/16/2012 1016   BUN 14.0 01/06/2013 1402   BUN 21 04/16/2012 1016   CREATININE 1.3 01/06/2013 1402   CREATININE 1.09 04/16/2012 1016   CALCIUM 9.6 01/06/2013 1402   CALCIUM 9.3 04/16/2012 1016   PROT 7.2 01/06/2013 1402   PROT 7.1 04/16/2012 1016   ALBUMIN 3.5 01/06/2013 1402   ALBUMIN 2.7* 04/16/2012 1016   AST 20 01/06/2013 1402   AST 40* 04/16/2012 1016   ALT 19 01/06/2013 1402   ALT 35 04/16/2012 1016   ALKPHOS 63 01/06/2013 1402   ALKPHOS 203* 04/16/2012 1016   BILITOT 0.49 01/06/2013 1402   BILITOT 0.8 04/16/2012 1016   GFRNONAA 70* 04/16/2012 1016   GFRAA 81* 04/16/2012 1016    No results found for this basename: SPEP,  UPEP,   kappa and lambda light chains    Lab Results   Component Value Date   WBC 3.7* 02/05/2013   NEUTROABS 1.7 02/05/2013   HGB 14.3 02/05/2013   HCT 42.5 02/05/2013   MCV 104.2* 02/05/2013   PLT 137* 02/05/2013      Chemistry      Component Value Date/Time   NA 141 01/06/2013 1402   NA 136 04/16/2012 1016   K 3.8 01/06/2013 1402   K 4.1 04/16/2012 1016   CL 105 08/24/2012 0849   CL 101 04/16/2012 1016   CO2 24 01/06/2013 1402   CO2 25 04/16/2012 1016   BUN 14.0 01/06/2013 1402   BUN 21 04/16/2012 1016   CREATININE 1.3 01/06/2013 1402   CREATININE 1.09 04/16/2012 1016      Component Value Date/Time   CALCIUM 9.6 01/06/2013 1402   CALCIUM 9.3 04/16/2012 1016   ALKPHOS 63 01/06/2013 1402   ALKPHOS 203* 04/16/2012 1016   AST 20 01/06/2013 1402   AST 40* 04/16/2012 1016   ALT 19 01/06/2013  1402   ALT 35 04/16/2012 1016   BILITOT 0.49 01/06/2013 1402   BILITOT 0.8 04/16/2012 1016     ASSESSMENT & PLAN:  #1 metastatic neuroendocrine tumor The patient is responding well to treatment. He has minimal side effects at this point in time. We'll continue treatment as scheduled and his next imaging study will be due in January 2015 #2 history of stroke and cardiomyopathy He continue anticoagulation therapy. The patient has no evidence of bleeding #3 chronic nausea I refilled his prescription Zofran today. #4 mild thrombocytopenia  This is likely due to recent treatment. The patient denies recent history of bleeding such as epistaxis, hematuria or hematochezia. He is asymptomatic from the low platelet count. I will observe for now.  he does not require transfusion now. I will continue the chemotherapy at current dose without dosage adjustment.  If the thrombocytopenia gets progressive worse in the future, I might have to delay his treatment or adjust the chemotherapy dose. #5 leukopenia This is likely due to recent treatment. The patient denies recent history of fevers, cough, chills, diarrhea or dysuria. He is asymptomatic from the leukopenia.  I will observe for now.  I will continue the chemotherapy at current dose without dosage adjustment.  If the leukopenia gets progressive worse in the future, I might have to delay his treatment or adjust the chemotherapy dose.  Orders Placed This Encounter  Procedures  . CBC with Differential    Standing Status: Future     Number of Occurrences:      Standing Expiration Date: 10/28/2013  . Comprehensive metabolic panel    Standing Status: Future     Number of Occurrences:      Standing Expiration Date: 02/05/2014   All questions were answered. The patient knows to call the clinic with any problems, questions or concerns. No barriers to learning was detected. I spent 25 minutes counseling the patient face to face. The total time spent in the appointment was 40 minutes and more than 50% was on counseling and review of test results     [facility], Rhiannan Kievit, MD 02/05/2013 2:47 PM

## 2013-02-10 ENCOUNTER — Other Ambulatory Visit: Payer: Self-pay | Admitting: Internal Medicine

## 2013-02-15 ENCOUNTER — Ambulatory Visit (INDEPENDENT_AMBULATORY_CARE_PROVIDER_SITE_OTHER): Payer: Medicaid Other | Admitting: Pharmacist

## 2013-02-15 DIAGNOSIS — I639 Cerebral infarction, unspecified: Secondary | ICD-10-CM

## 2013-02-15 DIAGNOSIS — Z7901 Long term (current) use of anticoagulants: Secondary | ICD-10-CM

## 2013-02-15 DIAGNOSIS — I6350 Cerebral infarction due to unspecified occlusion or stenosis of unspecified cerebral artery: Secondary | ICD-10-CM

## 2013-02-15 NOTE — Patient Instructions (Signed)
Patient instructed to take medications as defined in the Anti-coagulation Track section of this encounter.  Patient instructed to take today's dose.  Patient verbalized understanding of these instructions.    

## 2013-02-15 NOTE — Progress Notes (Signed)
Anti-Coagulation Progress Note  Jonathan Reed is a 67 y.o. male who is currently on an anti-coagulation regimen.    RECENT RESULTS: Recent results are below, the most recent result is correlated with a dose of 25 mg. per week: Lab Results  Component Value Date   INR 2.10 02/15/2013   INR 2.6 01/25/2013   INR 3.50 01/04/2013   PROTIME 30.0* 05/15/2012    ANTI-COAG DOSE: Anticoagulation Dose Instructions as of 02/15/2013     [name] Tue Wed Thu Fri Sat   New Dose 2.5 mg 5 mg 2.5 mg 2.5 mg 5 mg 2.5 mg 2.5 mg       ANTICOAG SUMMARY: Anticoagulation Episode Summary   Current INR goal 2.0-3.0  Next INR check 03/08/2013  INR from last check 2.10 (02/15/2013)  Weekly max dose   Target end date Indefinite  [facility]  Preferred lab   Send INR reminders to    Indications  CVA (cerebral infarction) [434.91] Long term (current) use of anticoagulants [V58.61]        Comments         ANTICOAG TODAY: Anticoagulation Summary as of 02/15/2013   INR goal 2.0-3.0  Selected INR 2.10 (02/15/2013)  Next INR check 03/08/2013  Target end date Indefinite   Indications  CVA (cerebral infarction) [434.91] Long term (current) use of anticoagulants [V58.61]      Anticoagulation Episode Summary   [facility]   Preferred lab    Send INR reminders to    Comments       PATIENT INSTRUCTIONS: Patient Instructions  Patient instructed to take medications as defined in the Anti-coagulation Track section of this encounter.  Patient instructed to take today's dose.  Patient verbalized understanding of these instructions.       FOLLOW-UP Return in 3 weeks (on 03/08/2013) for Follow up INR at 3PM.  [name], III Pharm.D., CACP

## 2013-02-17 ENCOUNTER — Ambulatory Visit: Payer: Medicaid Other | Admitting: Oncology

## 2013-02-17 ENCOUNTER — Other Ambulatory Visit: Payer: Medicaid Other | Admitting: Lab

## 2013-03-03 ENCOUNTER — Other Ambulatory Visit: Payer: Self-pay | Admitting: *Deleted

## 2013-03-03 MED ORDER — TEMOZOLOMIDE 100 MG PO CAPS: 400.0000 mg | ORAL_CAPSULE | Freq: Every day | ORAL | Status: DC

## 2013-03-08 ENCOUNTER — Telehealth: Payer: Self-pay | Admitting: *Deleted

## 2013-03-08 ENCOUNTER — Ambulatory Visit: Payer: Medicare Other

## 2013-03-08 NOTE — Telephone Encounter (Signed)
Per Karel Jarvis,  She re faxed the rx for Temodar to pharmacy today.

## 2013-03-08 NOTE — Telephone Encounter (Signed)
Vm from dau states Temodar Rx needs to be faxed to pharmacy ASAP.  Pt due to start next cycle tomorrow.   Rx was given to Axel Filler in managed care dept last week on 12/17.  Left VM for Ebony to f/u on status of Rx.

## 2013-03-09 ENCOUNTER — Other Ambulatory Visit (HOSPITAL_BASED_OUTPATIENT_CLINIC_OR_DEPARTMENT_OTHER): Payer: Medicaid Other

## 2013-03-09 ENCOUNTER — Telehealth: Payer: Self-pay | Admitting: *Deleted

## 2013-03-09 ENCOUNTER — Telehealth: Payer: Self-pay | Admitting: Hematology and Oncology

## 2013-03-09 ENCOUNTER — Encounter: Payer: Self-pay | Admitting: Hematology and Oncology

## 2013-03-09 ENCOUNTER — Ambulatory Visit (HOSPITAL_BASED_OUTPATIENT_CLINIC_OR_DEPARTMENT_OTHER): Payer: Medicaid Other | Admitting: Hematology and Oncology

## 2013-03-09 VITALS — BP 136/87 | HR 66 | Temp 97.3°F | Resp 18 | Ht 70.0 in | Wt 206.0 lb

## 2013-03-09 DIAGNOSIS — C7B8 Other secondary neuroendocrine tumors: Secondary | ICD-10-CM

## 2013-03-09 DIAGNOSIS — Z8673 Personal history of transient ischemic attack (TIA), and cerebral infarction without residual deficits: Secondary | ICD-10-CM

## 2013-03-09 DIAGNOSIS — D696 Thrombocytopenia, unspecified: Secondary | ICD-10-CM

## 2013-03-09 DIAGNOSIS — C787 Secondary malignant neoplasm of liver and intrahepatic bile duct: Secondary | ICD-10-CM

## 2013-03-09 DIAGNOSIS — I255 Ischemic cardiomyopathy: Secondary | ICD-10-CM

## 2013-03-09 DIAGNOSIS — C7A00 Malignant carcinoid tumor of unspecified site: Secondary | ICD-10-CM

## 2013-03-09 DIAGNOSIS — R944 Abnormal results of kidney function studies: Secondary | ICD-10-CM

## 2013-03-09 LAB — CBC WITH DIFFERENTIAL/PLATELET
BASO%: 0.6 % (ref 0.0–2.0)
Basophils Absolute: 0 10*3/uL (ref 0.0–0.1)
EOS%: 2.1 % (ref 0.0–7.0)
HCT: 45.9 % (ref 38.4–49.9)
HGB: 16.1 g/dL (ref 13.0–17.1)
LYMPH%: 22.2 % (ref 14.0–49.0)
MCH: 36.2 pg — ABNORMAL HIGH (ref 27.2–33.4)
MCHC: 35 g/dL (ref 32.0–36.0)
NEUT%: 54.1 % (ref 39.0–75.0)
Platelets: 138 10*3/uL — ABNORMAL LOW (ref 140–400)
RDW: 15.8 % — ABNORMAL HIGH (ref 11.0–14.6)
lymph#: 1.1 10*3/uL (ref 0.9–3.3)

## 2013-03-09 LAB — COMPREHENSIVE METABOLIC PANEL (CC13)
ALT: 20 U/L (ref 0–55)
AST: 21 U/L (ref 5–34)
Anion Gap: 11 mEq/L (ref 3–11)
BUN: 17.6 mg/dL (ref 7.0–26.0)
CO2: 24 mEq/L (ref 22–29)
Calcium: 9.7 mg/dL (ref 8.4–10.4)
Chloride: 101 mEq/L (ref 98–109)
Creatinine: 1.6 mg/dL — ABNORMAL HIGH (ref 0.7–1.3)
Total Bilirubin: 1.19 mg/dL (ref 0.20–1.20)

## 2013-03-09 MED ORDER — CAPECITABINE 500 MG PO TABS: 2000.0000 mg | ORAL_TABLET | Freq: Two times a day (BID) | ORAL | Status: DC

## 2013-03-09 MED ORDER — TEMOZOLOMIDE 100 MG PO CAPS: 400.0000 mg | ORAL_CAPSULE | Freq: Every day | ORAL | Status: DC

## 2013-03-09 NOTE — Telephone Encounter (Signed)
Gave pt appt for lab and MD for January 2015, gave pt oral contrast for CT

## 2013-03-09 NOTE — Telephone Encounter (Signed)
Message copied by Rolanda Jay on Tue Mar 09, 2013 11:22 AM ------      Message from: [facility], [place]      Created: Tue Mar 09, 2013 10:05 AM      Regarding: elevated cr       Make sure he increased his oral fluid intake       ------

## 2013-03-09 NOTE — Telephone Encounter (Signed)
Notified daughter of INR 2.06 and she will call Dr. Michaell Cowing w/ results and coumadin orders.   Informed her that Dr. Michaell Cowing can see results in computer.  Also routed results to his office.   Also informed her of lab tests indicate pt is dehydrated and he needs to push fluids.  She states not surprised as he has had a cold the past few days and not eating and drinking like he normally does.  Instructed her to encourage pt to drink plenty of fluids.  She verbalized understanding.

## 2013-03-09 NOTE — Progress Notes (Signed)
[facility] OFFICE PROGRESS NOTE  Patient Care Team: Ky Barban, MD as PCP - General (Internal Medicine) Loyce Dys as Referring Physician (Surgery) Artis Delay, MD as Consulting Physician (Hematology and Oncology)  DIAGNOSIS: Metastatic well differentiated neuroendocrine tumor to the liver, ongoing chemotherapy with Xeloda and Temodar  SUMMARY OF ONCOLOGIC HISTORY: In January of 2014 the patient was hospitalized for shortness of breath. He presented also with some epigastric pain and imaging studies review abnormal liver mass.  On 04/15/2012 he underwent biopsy which showed well differentiated neuroendocrine tumor.  Since February 2014, he was placed on palliative chemotherapy with Xeloda 1000 mg per meter squared twice a day for 2 weeks, off 2 weeks and Temodar 200 mg per meter squared daily on days 10-14, each cycle is a 28 day cycle.  May 2014 CT scan show partial response.  September 2014 repeat imaging study show continued response to treatment.  INTERVAL HISTORY: Jonathan Reed 67 y.o. male returns for further followup. He recently came down with upper is retracted infection and is currently on antibiotics. He is feeling better. He remained on anticoagulation therapy for history of stroke.The patient denies any recent signs or symptoms of bleeding such as spontaneous epistaxis, hematuria or hematochezia. He denies any recent fever, chills, night sweats or abnormal weight loss  I have reviewed the past medical history, past surgical history, social history and family history with the patient and they are unchanged from previous note.  ALLERGIES:  has No Known Allergies.  MEDICATIONS:  Current Outpatient Prescriptions  Medication Sig Dispense Refill  . amoxicillin (AMOXIL) 875 MG tablet Take 875 mg by mouth 2 (two) times daily.      . capecitabine (XELODA) 500 MG tablet Take 4 tablets (2,000 mg total) by mouth 2 (two) times daily after a meal. Take twice  daily for 14 days on and then 14 days off each cycle.  112 tablet  0  . DOCQLACE 100 MG capsule TAKE 1 CAPSULE BY MOUTH EVERY DAY  30 capsule  0  . lisinopril-hydrochlorothiazide (PRINZIDE,ZESTORETIC) 20-25 MG per tablet Take 0.5 tablets by mouth daily.  90 tablet  2  . metoprolol succinate (TOPROL-XL) 25 MG 24 hr tablet Take 50 mg by mouth daily.      . ondansetron (ZOFRAN) 4 MG tablet Take 1 tablet (4 mg total) by mouth every 12 (twelve) hours as needed for nausea.  60 tablet  3  . oxyCODONE (ROXICODONE) 5 MG immediate release tablet Take 1 tablet (5 mg total) by mouth every 6 (six) hours as needed for pain.  30 tablet  0  . prochlorperazine (COMPAZINE) 10 MG tablet Take 1 tablet (10 mg total) by mouth every 6 (six) hours as needed.  30 tablet  3  . spironolactone (ALDACTONE) 25 MG tablet Take 25 mg by mouth daily.      Marland Kitchen temozolomide (TEMODAR) 100 MG capsule Take 4 capsules (400 mg total) by mouth daily. May take on an empty stomach or at bedtime to decrease nausea & vomiting.  Take daily for 5 days on days 10 thru 14 of Xeloda.  20 capsule  0  . warfarin (COUMADIN) 5 MG tablet TAKE AS DIRECTED  51 tablet  0   No current facility-administered medications for this visit.    REVIEW OF SYSTEMS:   Constitutional: Denies fevers, chills or abnormal weight loss Eyes: Denies blurriness of vision Cardiovascular: Denies palpitation, chest discomfort or lower extremity swelling Gastrointestinal:  Denies nausea, heartburn or change in  bowel habits Skin: Denies abnormal skin rashes Lymphatics: Denies new lymphadenopathy or easy bruising Neurological:Denies numbness, tingling or new weaknesses Behavioral/Psych: Mood is stable, no new changes  All other systems were reviewed with the patient and are negative.  PHYSICAL EXAMINATION: ECOG PERFORMANCE STATUS: 1 - Symptomatic but completely ambulatory  Filed Vitals:   03/09/13 0903  BP: 136/87  Pulse: 66  Temp: 97.3 F (36.3 C)  Resp: 18   Filed  Weights   03/09/13 0903  Weight: 206 lb (93.441 kg)    GENERAL:alert, no distress and comfortable SKIN: skin color, texture, turgor are normal, no rashes or significant lesions EYES: normal, Conjunctiva are pink and non-injected, sclera clear OROPHARYNX:no exudate, no erythema and lips, buccal mucosa, and tongue normal  NECK: supple, thyroid normal size, non-tender, without nodularity LYMPH:  no palpable lymphadenopathy in the cervical, axillary or inguinal LUNGS: clear to auscultation and percussion with normal breathing effort HEART: regular rate & rhythm and no murmurs and no lower extremity edema ABDOMEN:abdomen soft, non-tender and normal bowel sounds Musculoskeletal:no cyanosis of digits and no clubbing  NEURO: alert & oriented x 3 with fluent speech, no focal motor/sensory deficits  LABORATORY DATA:  I have reviewed the data as listed    Component Value Date/Time   NA 136 03/09/2013 0841   NA 136 04/16/2012 1016   K 4.1 03/09/2013 0841   K 4.1 04/16/2012 1016   CL 105 08/24/2012 0849   CL 101 04/16/2012 1016   CO2 24 03/09/2013 0841   CO2 25 04/16/2012 1016   GLUCOSE 103 03/09/2013 0841   GLUCOSE 100* 08/24/2012 0849   GLUCOSE 112* 04/16/2012 1016   BUN 17.6 03/09/2013 0841   BUN 21 04/16/2012 1016   CREATININE 1.6* 03/09/2013 0841   CREATININE 1.09 04/16/2012 1016   CALCIUM 9.7 03/09/2013 0841   CALCIUM 9.3 04/16/2012 1016   PROT 8.1 03/09/2013 0841   PROT 7.1 04/16/2012 1016   ALBUMIN 3.9 03/09/2013 0841   ALBUMIN 2.7* 04/16/2012 1016   AST 21 03/09/2013 0841   AST 40* 04/16/2012 1016   ALT 20 03/09/2013 0841   ALT 35 04/16/2012 1016   ALKPHOS 67 03/09/2013 0841   ALKPHOS 203* 04/16/2012 1016   BILITOT 1.19 03/09/2013 0841   BILITOT 0.8 04/16/2012 1016   GFRNONAA 70* 04/16/2012 1016   GFRAA 81* 04/16/2012 1016    No results found for this basename: SPEP,  UPEP,   kappa and lambda light chains    Lab Results  Component Value Date   WBC 5.0 03/09/2013   NEUTROABS 2.7  03/09/2013   HGB 16.1 03/09/2013   HCT 45.9 03/09/2013   MCV 103.4* 03/09/2013   PLT 138* 03/09/2013      Chemistry      Component Value Date/Time   NA 136 03/09/2013 0841   NA 136 04/16/2012 1016   K 4.1 03/09/2013 0841   K 4.1 04/16/2012 1016   CL 105 08/24/2012 0849   CL 101 04/16/2012 1016   CO2 24 03/09/2013 0841   CO2 25 04/16/2012 1016   BUN 17.6 03/09/2013 0841   BUN 21 04/16/2012 1016   CREATININE 1.6* 03/09/2013 0841   CREATININE 1.09 04/16/2012 1016      Component Value Date/Time   CALCIUM 9.7 03/09/2013 0841   CALCIUM 9.3 04/16/2012 1016   ALKPHOS 67 03/09/2013 0841   ALKPHOS 203* 04/16/2012 1016   AST 21 03/09/2013 0841   AST 40* 04/16/2012 1016   ALT 20 03/09/2013 0841   ALT  35 04/16/2012 1016   BILITOT 1.19 03/09/2013 0841   BILITOT 0.8 04/16/2012 1016     ASSESSMENT & PLAN:  #1 metastatic neuroendocrine tumor The patient is responding well to treatment. He has minimal side effects at this point in time. We'll continue treatment as scheduled and his next imaging study will be due in January 2015. I will order the imaging studies before his next return visit. I refilled his chemotherapy today. #2 history of stroke and cardiomyopathy He continue anticoagulation therapy. The patient has no evidence of bleeding Due to recent antibiotic therapy, we will draw a PT/INR and fall with the results of his primary care provider #3 elevated creatinine  This could be due to recent dehydration. Recommend he increase oral fluid #4 mild thrombocytopenia  This is likely due to recent treatment. The patient denies recent history of bleeding such as epistaxis, hematuria or hematochezia. He is asymptomatic from the low platelet count. I will observe for now.  he does not require transfusion now. I will continue the chemotherapy at current dose without dosage adjustment.  If the thrombocytopenia gets progressive worse in the future, I might have to delay his treatment or adjust the  chemotherapy dose. #5 recent upper respiratory tract infection Is currently on antibiotics. I do not feel he needs to hold his chemotherapy  Orders Placed This Encounter  Procedures  . CT Chest W Contrast    Standing Status: Future     Number of Occurrences:      Standing Expiration Date: 05/09/2014    Order Specific Question:  Reason for Exam (SYMPTOM  OR DIAGNOSIS REQUIRED)    Answer:  metastatic neuroendocrine tumor    Order Specific Question:  Preferred imaging location?    Answer:  [facility]  . CT Abdomen Pelvis W Contrast    Standing Status: Future     Number of Occurrences:      Standing Expiration Date: 06/09/2014    Order Specific Question:  Reason for Exam (SYMPTOM  OR DIAGNOSIS REQUIRED)    Answer:  metastatic neuroendocrine tumor    Order Specific Question:  Preferred imaging location?    Answer:  [facility]  . Protime-INR    Standing Status: Future     Number of Occurrences: 1     Standing Expiration Date: 03/09/2014  . Comprehensive metabolic panel    Standing Status: Future     Number of Occurrences:      Standing Expiration Date: 03/09/2014  . CBC with Differential    Standing Status: Future     Number of Occurrences:      Standing Expiration Date: 11/29/2013  . Prothrombin Time   All questions were answered. The patient knows to call the clinic with any problems, questions or concerns. No barriers to learning was detected.    Karlin Binion, MD 03/09/2013 10:03 AM

## 2013-03-24 ENCOUNTER — Other Ambulatory Visit: Payer: Medicaid Other | Admitting: Lab

## 2013-03-24 ENCOUNTER — Ambulatory Visit (INDEPENDENT_AMBULATORY_CARE_PROVIDER_SITE_OTHER): Payer: Medicare Other | Admitting: Pharmacist

## 2013-03-24 ENCOUNTER — Telehealth: Payer: Self-pay | Admitting: *Deleted

## 2013-03-24 ENCOUNTER — Ambulatory Visit: Payer: Medicaid Other

## 2013-03-24 DIAGNOSIS — I639 Cerebral infarction, unspecified: Secondary | ICD-10-CM

## 2013-03-24 DIAGNOSIS — I6350 Cerebral infarction due to unspecified occlusion or stenosis of unspecified cerebral artery: Secondary | ICD-10-CM

## 2013-03-24 DIAGNOSIS — Z7901 Long term (current) use of anticoagulants: Secondary | ICD-10-CM

## 2013-03-24 LAB — POCT INR: INR: 2.1

## 2013-03-24 MED ORDER — WARFARIN SODIUM 5 MG PO TABS: ORAL_TABLET | ORAL | Status: DC

## 2013-03-24 NOTE — Telephone Encounter (Signed)
Dau left VM asking about appt for CT scan.  Called [employer] and s/w Morey Hummingbird.  Asked her to call dau for Scan appt and instructions.  She agreed.

## 2013-03-24 NOTE — Progress Notes (Signed)
Anti-Coagulation Progress Note  Jonathan Reed is a 69 y.o. male who is currently on an anti-coagulation regimen.    RECENT RESULTS: Recent results are below, the most recent result is correlated with a dose of 25 mg. per week: Lab Results  Component Value Date   INR 2.10 03/24/2013   INR 2.06* 03/09/2013   INR 2.10 02/15/2013   PROTIME 30.0* 05/15/2012    ANTI-COAG DOSE: Anticoagulation Dose Instructions as of 03/24/2013     Sun Mon Tue Wed Thu Fri Sat   New Dose 5 mg 2.5 mg 5 mg 2.5 mg 5 mg 5 mg 5 mg       ANTICOAG SUMMARY: Anticoagulation Episode Summary   Current INR goal 2.0-3.0  Next INR check 04/12/2013  INR from last check 2.10 (03/24/2013)  Weekly max dose   Target end date Indefinite  [facility]  Preferred lab   Send INR reminders to    Indications  CVA (cerebral infarction) [434.91] Long term (current) use of anticoagulants [V58.61]        Comments         ANTICOAG TODAY: Anticoagulation Summary as of 03/24/2013   INR goal 2.0-3.0  Selected INR 2.10 (03/24/2013)  Next INR check 04/12/2013  Target end date Indefinite   Indications  CVA (cerebral infarction) [434.91] Long term (current) use of anticoagulants [V58.61]      Anticoagulation Episode Summary   [facility]   Preferred lab    Send INR reminders to    Comments       PATIENT INSTRUCTIONS: Patient Instructions  Patient instructed to take medications as defined in the Anti-coagulation Track section of this encounter.  Patient instructed to take today's dose.  Patient verbalized understanding of these instructions.       FOLLOW-UP Return in 3 weeks (on 04/12/2013) for Follow up INR at 1115h.  [name], III Pharm.D., CACP

## 2013-03-24 NOTE — Patient Instructions (Signed)
Patient instructed to take medications as defined in the Anti-coagulation Track section of this encounter.  Patient instructed to take today's dose.  Patient verbalized understanding of these instructions.    

## 2013-04-06 ENCOUNTER — Ambulatory Visit (HOSPITAL_COMMUNITY)
Admission: RE | Admit: 2013-04-06 | Discharge: 2013-04-06 | Disposition: A | Discharge status: Home / Self Care | Payer: Medicare Other | Source: Ambulatory Visit | Attending: Hematology and Oncology | Admitting: Hematology and Oncology

## 2013-04-06 DIAGNOSIS — J984 Other disorders of lung: Secondary | ICD-10-CM | POA: Diagnosis not present

## 2013-04-06 DIAGNOSIS — D7389 Other diseases of spleen: Secondary | ICD-10-CM | POA: Insufficient documentation

## 2013-04-06 DIAGNOSIS — I255 Ischemic cardiomyopathy: Secondary | ICD-10-CM

## 2013-04-06 DIAGNOSIS — C7A00 Malignant carcinoid tumor of unspecified site: Secondary | ICD-10-CM | POA: Diagnosis not present

## 2013-04-06 DIAGNOSIS — I700 Atherosclerosis of aorta: Secondary | ICD-10-CM | POA: Insufficient documentation

## 2013-04-06 DIAGNOSIS — N2889 Other specified disorders of kidney and ureter: Secondary | ICD-10-CM | POA: Insufficient documentation

## 2013-04-06 DIAGNOSIS — C7B8 Other secondary neuroendocrine tumors: Secondary | ICD-10-CM

## 2013-04-06 DIAGNOSIS — I2510 Atherosclerotic heart disease of native coronary artery without angina pectoris: Secondary | ICD-10-CM | POA: Insufficient documentation

## 2013-04-06 DIAGNOSIS — C787 Secondary malignant neoplasm of liver and intrahepatic bile duct: Secondary | ICD-10-CM | POA: Diagnosis present

## 2013-04-06 DIAGNOSIS — Z95810 Presence of automatic (implantable) cardiac defibrillator: Secondary | ICD-10-CM | POA: Insufficient documentation

## 2013-04-06 MED ORDER — IOHEXOL 300 MG/ML  SOLN
100.0000 mL | Freq: Once | INTRAMUSCULAR | Status: AC | PRN
  Administered 2013-04-06: 100 mL via INTRAVENOUS

## 2013-04-12 ENCOUNTER — Encounter: Payer: Self-pay | Admitting: Hematology and Oncology

## 2013-04-12 ENCOUNTER — Telehealth: Payer: Self-pay | Admitting: Hematology and Oncology

## 2013-04-12 ENCOUNTER — Ambulatory Visit (HOSPITAL_BASED_OUTPATIENT_CLINIC_OR_DEPARTMENT_OTHER): Payer: Medicare Other | Admitting: Hematology and Oncology

## 2013-04-12 ENCOUNTER — Ambulatory Visit (INDEPENDENT_AMBULATORY_CARE_PROVIDER_SITE_OTHER): Payer: Medicare Other | Admitting: Pharmacist

## 2013-04-12 ENCOUNTER — Other Ambulatory Visit (HOSPITAL_BASED_OUTPATIENT_CLINIC_OR_DEPARTMENT_OTHER): Payer: Medicare Other

## 2013-04-12 VITALS — BP 148/90 | HR 50 | Temp 98.1°F | Resp 20 | Ht 70.0 in | Wt 211.5 lb

## 2013-04-12 DIAGNOSIS — I2589 Other forms of chronic ischemic heart disease: Secondary | ICD-10-CM

## 2013-04-12 DIAGNOSIS — I6350 Cerebral infarction due to unspecified occlusion or stenosis of unspecified cerebral artery: Secondary | ICD-10-CM

## 2013-04-12 DIAGNOSIS — D72819 Decreased white blood cell count, unspecified: Secondary | ICD-10-CM

## 2013-04-12 DIAGNOSIS — C787 Secondary malignant neoplasm of liver and intrahepatic bile duct: Secondary | ICD-10-CM

## 2013-04-12 DIAGNOSIS — C7B8 Other secondary neuroendocrine tumors: Secondary | ICD-10-CM

## 2013-04-12 DIAGNOSIS — I255 Ischemic cardiomyopathy: Secondary | ICD-10-CM

## 2013-04-12 DIAGNOSIS — Z7901 Long term (current) use of anticoagulants: Secondary | ICD-10-CM

## 2013-04-12 DIAGNOSIS — C7A00 Malignant carcinoid tumor of unspecified site: Secondary | ICD-10-CM

## 2013-04-12 DIAGNOSIS — I639 Cerebral infarction, unspecified: Secondary | ICD-10-CM

## 2013-04-12 DIAGNOSIS — D696 Thrombocytopenia, unspecified: Secondary | ICD-10-CM

## 2013-04-12 LAB — COMPREHENSIVE METABOLIC PANEL (CC13)
ALT: 21 U/L (ref 0–55)
ANION GAP: 9 meq/L (ref 3–11)
AST: 20 U/L (ref 5–34)
Albumin: 3.9 g/dL (ref 3.5–5.0)
Alkaline Phosphatase: 63 U/L (ref 40–150)
BUN: 12.7 mg/dL (ref 7.0–26.0)
CHLORIDE: 108 meq/L (ref 98–109)
CO2: 25 mEq/L (ref 22–29)
CREATININE: 1.3 mg/dL (ref 0.7–1.3)
Calcium: 9.9 mg/dL (ref 8.4–10.4)
Glucose: 91 mg/dl (ref 70–140)
Potassium: 3.9 mEq/L (ref 3.5–5.1)
SODIUM: 143 meq/L (ref 136–145)
Total Bilirubin: 0.58 mg/dL (ref 0.20–1.20)
Total Protein: 7.2 g/dL (ref 6.4–8.3)

## 2013-04-12 LAB — CBC WITH DIFFERENTIAL/PLATELET
BASO%: 0.5 % (ref 0.0–2.0)
BASOS ABS: 0 10*3/uL (ref 0.0–0.1)
EOS%: 1.6 % (ref 0.0–7.0)
Eosinophils Absolute: 0.1 10*3/uL (ref 0.0–0.5)
HCT: 40.1 % (ref 38.4–49.9)
HEMOGLOBIN: 13.7 g/dL (ref 13.0–17.1)
LYMPH#: 0.9 10*3/uL (ref 0.9–3.3)
LYMPH%: 26.4 % (ref 14.0–49.0)
MCH: 35.7 pg — ABNORMAL HIGH (ref 27.2–33.4)
MCHC: 34.2 g/dL (ref 32.0–36.0)
MCV: 104.4 fL — ABNORMAL HIGH (ref 79.3–98.0)
MONO#: 0.6 10*3/uL (ref 0.1–0.9)
MONO%: 17.1 % — ABNORMAL HIGH (ref 0.0–14.0)
NEUT#: 1.8 10*3/uL (ref 1.5–6.5)
NEUT%: 54.4 % (ref 39.0–75.0)
Platelets: 130 10*3/uL — ABNORMAL LOW (ref 140–400)
RBC: 3.84 10*6/uL — ABNORMAL LOW (ref 4.20–5.82)
RDW: 16.5 % — ABNORMAL HIGH (ref 11.0–14.6)
WBC: 3.4 10*3/uL — AB (ref 4.0–10.3)

## 2013-04-12 LAB — POCT INR: INR: 2.1

## 2013-04-12 NOTE — Progress Notes (Signed)
Anti-Coagulation Progress Note  Jonathan Reed is a 67 y.o. male who is currently on an anti-coagulation regimen.    RECENT RESULTS: Recent results are below, the most recent result is correlated with a dose of 30 mg. per week: Lab Results  Component Value Date   INR 2.10 04/12/2013   INR 2.10 03/24/2013   INR 2.06* 03/09/2013   PROTIME 30.0* 05/15/2012    ANTI-COAG DOSE: Anticoagulation Dose Instructions as of 04/12/2013     [name] Tue Wed Thu Fri Sat   New Dose 5 mg 5 mg 5 mg 5 mg 5 mg 5 mg 5 mg       ANTICOAG SUMMARY: Anticoagulation Episode Summary   Current INR goal 2.0-3.0  Next INR check 05/03/2013  INR from last check 2.10 (04/12/2013)  Weekly max dose   Target end date Indefinite  [facility] check [facility]  Preferred lab   Send INR reminders to    Indications  CVA (cerebral infarction) [434.91] Long term (current) use of anticoagulants [V58.61]        Comments         ANTICOAG TODAY: Anticoagulation Summary as of 04/12/2013   INR goal 2.0-3.0  Selected INR 2.10 (04/12/2013)  Next INR check 05/03/2013  Target end date Indefinite   Indications  CVA (cerebral infarction) [434.91] Long term (current) use of anticoagulants [V58.61]      Anticoagulation Episode Summary   [facility]   Preferred lab    Send INR reminders to    Comments       PATIENT INSTRUCTIONS: Patient Instructions  Patient instructed to take medications as defined in the Anti-coagulation Track section of this encounter.  Patient instructed to take today's dose.  Patient verbalized understanding of these instructions.       FOLLOW-UP Return in 3 weeks (on 05/03/2013) for Follow up INR at 1200h.  [name], III Pharm.D., CACP

## 2013-04-12 NOTE — Telephone Encounter (Signed)
gv and printed aptp sched and avs forpt for Feb °

## 2013-04-12 NOTE — Patient Instructions (Signed)
Patient instructed to take medications as defined in the Anti-coagulation Track section of this encounter.  Patient instructed to take today's dose.  Patient verbalized understanding of these instructions.    

## 2013-04-12 NOTE — Progress Notes (Signed)
[facility] OFFICE PROGRESS NOTE  Patient Care Team: Blain Pais, MD as PCP - General (Internal Medicine) Estill Dooms as Referring Physician (Surgery) Heath Lark, MD as Consulting Physician (Hematology and Oncology)  DIAGNOSIS: Metastatic well differentiated neuroendocrine tumor to the liver, ongoing chemotherapy with Xeloda and Temodar  SUMMARY OF ONCOLOGIC HISTORY: In January of 2014 the patient was hospitalized for shortness of breath. He presented also with some epigastric pain and imaging studies review abnormal liver mass.  On 04/15/2012 he underwent biopsy which showed well differentiated neuroendocrine tumor.  Since February 2014, he was placed on palliative chemotherapy with Xeloda 1000 mg per meter squared twice a day for 2 weeks, off 2 weeks and Temodar 200 mg per meter squared daily on days 10-14, each cycle is a 28 day cycle.  May 2014 CT scan show partial response.  September 2014 repeat imaging study show continued response to treatment. January 2015, repeat imaging study showed mixed response INTERVAL HISTORY: Jonathan Reed 68 y.o. male returns for further followup. Denies any recent infection. He remained on anticoagulation therapy for history of stroke.The patient denies any recent signs or symptoms of bleeding such as spontaneous epistaxis, hematuria or hematochezia. The patient denies any mouth sores, nausea, vomiting or change in bowel habits He denies any recent fever, chills, night sweats or abnormal weight loss  I have reviewed the past medical history, past surgical history, social history and family history with the patient and they are unchanged from previous note.  ALLERGIES:  has No Known Allergies.  MEDICATIONS:  Current Outpatient Prescriptions  Medication Sig Dispense Refill  . capecitabine (XELODA) 500 MG tablet Take 4 tablets (2,000 mg total) by mouth 2 (two) times daily after a meal. Take twice daily for 14 days on and then 14  days off each cycle.  112 tablet  0  . DOCQLACE 100 MG capsule TAKE 1 CAPSULE BY MOUTH EVERY DAY  30 capsule  0  . lisinopril-hydrochlorothiazide (PRINZIDE,ZESTORETIC) 20-25 MG per tablet Take 0.5 tablets by mouth daily.  90 tablet  2  . metoprolol succinate (TOPROL-XL) 25 MG 24 hr tablet Take 50 mg by mouth daily.      . ondansetron (ZOFRAN) 4 MG tablet Take 1 tablet (4 mg total) by mouth every 12 (twelve) hours as needed for nausea.  60 tablet  3  . prochlorperazine (COMPAZINE) 10 MG tablet Take 1 tablet (10 mg total) by mouth every 6 (six) hours as needed.  30 tablet  3  . spironolactone (ALDACTONE) 25 MG tablet Take 25 mg by mouth daily.      Marland Kitchen temozolomide (TEMODAR) 100 MG capsule Take 4 capsules (400 mg total) by mouth daily. May take on an empty stomach or at bedtime to decrease nausea & vomiting.  Take daily for 5 days on days 10 thru 14 of Xeloda.  20 capsule  0  . warfarin (COUMADIN) 5 MG tablet Take 1/2 tablet on Monday/Wednesday; Take 1 tablet all other days! **Patient requests 90 day supply**  72 tablet  3  . amoxicillin (AMOXIL) 875 MG tablet Take 875 mg by mouth 2 (two) times daily.      Marland Kitchen oxyCODONE (ROXICODONE) 5 MG immediate release tablet Take 1 tablet (5 mg total) by mouth every 6 (six) hours as needed for pain.  30 tablet  0   No current facility-administered medications for this visit.    REVIEW OF SYSTEMS:   Constitutional: Denies fevers, chills or abnormal weight loss Eyes: Denies blurriness  of vision Ears, nose, mouth, throat, and face: Denies mucositis or sore throat Respiratory: Denies cough, dyspnea or wheezes Cardiovascular: Denies palpitation, chest discomfort or lower extremity swelling Gastrointestinal:  Denies nausea, heartburn or change in bowel habits Skin: Denies abnormal skin rashes Lymphatics: Denies new lymphadenopathy or easy bruising Neurological:Denies numbness, tingling or new weaknesses Behavioral/Psych: Mood is stable, no new changes  All other  systems were reviewed with the patient and are negative.  PHYSICAL EXAMINATION: ECOG PERFORMANCE STATUS: 0 - Asymptomatic  Filed Vitals:   04/12/13 0852  BP: 148/90  Pulse: 50  Temp: 98.1 F (36.7 C)  Resp: 20   Filed Weights   04/12/13 0852  Weight: 211 lb 8 oz (95.936 kg)    GENERAL:alert, no distress and comfortable SKIN: skin color, texture, turgor are normal, no rashes or significant lesions EYES: normal, Conjunctiva are pink and non-injected, sclera clear OROPHARYNX:no exudate, no erythema and lips, buccal mucosa, and tongue normal  NECK: supple, thyroid normal size, non-tender, without nodularity LYMPH:  no palpable lymphadenopathy in the cervical, axillary or inguinal LUNGS: clear to auscultation and percussion with normal breathing effort HEART: regular rate & rhythm and no murmurs and no lower extremity edema ABDOMEN:abdomen soft, non-tender and normal bowel sounds Musculoskeletal:no cyanosis of digits and no clubbing  NEURO: alert & oriented x 3 with fluent speech, no focal motor/sensory deficits  LABORATORY DATA:  I have reviewed the data as listed    Component Value Date/Time   NA 143 04/12/2013 0839   NA 136 04/16/2012 1016   K 3.9 04/12/2013 0839   K 4.1 04/16/2012 1016   CL 105 08/24/2012 0849   CL 101 04/16/2012 1016   CO2 25 04/12/2013 0839   CO2 25 04/16/2012 1016   GLUCOSE 91 04/12/2013 0839   GLUCOSE 100* 08/24/2012 0849   GLUCOSE 112* 04/16/2012 1016   BUN 12.7 04/12/2013 0839   BUN 21 04/16/2012 1016   CREATININE 1.3 04/12/2013 0839   CREATININE 1.09 04/16/2012 1016   CALCIUM 9.9 04/12/2013 0839   CALCIUM 9.3 04/16/2012 1016   PROT 7.2 04/12/2013 0839   PROT 7.1 04/16/2012 1016   ALBUMIN 3.9 04/12/2013 0839   ALBUMIN 2.7* 04/16/2012 1016   AST 20 04/12/2013 0839   AST 40* 04/16/2012 1016   ALT 21 04/12/2013 0839   ALT 35 04/16/2012 1016   ALKPHOS 63 04/12/2013 0839   ALKPHOS 203* 04/16/2012 1016   BILITOT 0.58 04/12/2013 0839   BILITOT 0.8 04/16/2012 1016    GFRNONAA 70* 04/16/2012 1016   GFRAA 81* 04/16/2012 1016    No results found for this basename: SPEP,  UPEP,   kappa and lambda light chains    Lab Results  Component Value Date   WBC 3.4* 04/12/2013   NEUTROABS 1.8 04/12/2013   HGB 13.7 04/12/2013   HCT 40.1 04/12/2013   MCV 104.4* 04/12/2013   PLT 130* 04/12/2013      Chemistry      Component Value Date/Time   NA 143 04/12/2013 0839   NA 136 04/16/2012 1016   K 3.9 04/12/2013 0839   K 4.1 04/16/2012 1016   CL 105 08/24/2012 0849   CL 101 04/16/2012 1016   CO2 25 04/12/2013 0839   CO2 25 04/16/2012 1016   BUN 12.7 04/12/2013 0839   BUN 21 04/16/2012 1016   CREATININE 1.3 04/12/2013 0839   CREATININE 1.09 04/16/2012 1016      Component Value Date/Time   CALCIUM 9.9 04/12/2013 0839   CALCIUM 9.3  04/16/2012 1016   ALKPHOS 63 04/12/2013 0839   ALKPHOS 203* 04/16/2012 1016   AST 20 04/12/2013 0839   AST 40* 04/16/2012 1016   ALT 21 04/12/2013 0839   ALT 35 04/16/2012 1016   BILITOT 0.58 04/12/2013 0839   BILITOT 0.8 04/16/2012 1016       RADIOGRAPHIC STUDIES: I reviewed the CT scan imaging study with the patient and and his family which overall show mixed response and stable disease I have personally reviewed the radiological images as listed and agreed with the findings in the report.  ASSESSMENT & PLAN:  #1 metastatic neuroendocrine tumor The patient is responding well to treatment. He has minimal side effects at this point in time.  Imaging study overall show mixed response and stable disease. I recommend we continue same treatment and repeat imaging again in 3 months. #2 history of stroke and cardiomyopathy He continue anticoagulation therapy. The patient has no evidence of bleeding #3 elevated creatinine  This could be due to recent dehydration. Recommend he increase oral fluid #4 mild thrombocytopenia  This is likely due to recent treatment. The patient denies recent history of bleeding such as epistaxis, hematuria or hematochezia. He  is asymptomatic from the low platelet count. I will observe for now.  he does not require transfusion now. I will continue the chemotherapy at current dose without dosage adjustment.  If the thrombocytopenia gets progressive worse in the future, I might have to delay his treatment or adjust the chemotherapy dose. #5 leukopenia This is likely due to recent treatment. The patient denies recent history of fevers, cough, chills, diarrhea or dysuria. He is asymptomatic from the leukopenia. I will observe for now.  I will continue the chemotherapy at current dose without dosage adjustment.  If the leukopenia gets progressive worse in the future, I might have to delay his treatment or adjust the chemotherapy dose.   Orders Placed This Encounter  Procedures  . Comprehensive metabolic panel    Standing Status: Future     Number of Occurrences:      Standing Expiration Date: 04/12/2014  . CBC with Differential    Standing Status: Future     Number of Occurrences:      Standing Expiration Date: 04/12/2014   All questions were answered. The patient knows to call the clinic with any problems, questions or concerns. No barriers to learning was detected. I spent 40 minutes counseling the patient face to face. The total time spent in the appointment was 60 minutes and more than 50% was on counseling and review of test results     [facility] [place], MD 04/12/2013 9:56 AM

## 2013-04-14 NOTE — Progress Notes (Signed)
  Indication: CVA.  Duration: indefinite.  INR: 2-3 target.  Agree with Dr. Gladstone Pih assessment and plan.

## 2013-04-21 ENCOUNTER — Ambulatory Visit: Payer: Medicaid Other | Admitting: Oncology

## 2013-04-21 ENCOUNTER — Other Ambulatory Visit: Payer: Medicaid Other | Admitting: Lab

## 2013-05-03 ENCOUNTER — Ambulatory Visit (INDEPENDENT_AMBULATORY_CARE_PROVIDER_SITE_OTHER): Payer: Medicare Other | Admitting: Pharmacist

## 2013-05-03 DIAGNOSIS — I6350 Cerebral infarction due to unspecified occlusion or stenosis of unspecified cerebral artery: Secondary | ICD-10-CM

## 2013-05-03 DIAGNOSIS — I639 Cerebral infarction, unspecified: Secondary | ICD-10-CM

## 2013-05-03 DIAGNOSIS — Z7901 Long term (current) use of anticoagulants: Secondary | ICD-10-CM

## 2013-05-03 LAB — POCT INR: INR: 4

## 2013-05-03 NOTE — Patient Instructions (Signed)
Patient instructed to take medications as defined in the Anti-coagulation Track section of this encounter.  Patient instructed to take today's dose.  Patient verbalized understanding of these instructions.    

## 2013-05-03 NOTE — Progress Notes (Signed)
Anti-Coagulation Progress Note  Jonathan Reed is a 68 y.o. male who is currently on an anti-coagulation regimen.    RECENT RESULTS: Recent results are below, the most recent result is correlated with a dose of 35 mg. per week: Lab Results  Component Value Date   INR 4.00 05/03/2013   INR 2.10 04/12/2013   INR 2.10 03/24/2013   PROTIME 30.0* 05/15/2012    ANTI-COAG DOSE: Anticoagulation Dose Instructions as of 05/03/2013     Sun Mon Tue Wed Thu Fri Sat   New Dose 5 mg 2.5 mg 5 mg 5 mg 2.5 mg 5 mg 5 mg       ANTICOAG SUMMARY: Anticoagulation Episode Summary   Current INR goal 2.0-3.0  Next INR check 05/24/2013  INR from last check 4.00! (05/03/2013)  Weekly max dose   Target end date Indefinite  [facility]  Preferred lab   Send INR reminders to    Indications  CVA (cerebral infarction) [434.91] Long term (current) use of anticoagulants [V58.61]        Comments         ANTICOAG TODAY: Anticoagulation Summary as of 05/03/2013   INR goal 2.0-3.0  Selected INR 4.00! (05/03/2013)  Next INR check 05/24/2013  Target end date Indefinite   Indications  CVA (cerebral infarction) [434.91] Long term (current) use of anticoagulants [V58.61]      Anticoagulation Episode Summary   [facility]   Preferred lab    Send INR reminders to    Comments       PATIENT INSTRUCTIONS: Patient Instructions  Patient instructed to take medications as defined in the Anti-coagulation Track section of this encounter.  Patient instructed to take today's dose.  Patient verbalized understanding of these instructions.       FOLLOW-UP Return in 3 weeks (on 05/24/2013) for Follow up INR at 1145.  [name], III Pharm.D., CACP

## 2013-05-03 NOTE — Progress Notes (Deleted)
Anti-Coagulation Progress Note  Jonathan Reed is a 68 y.o. male who is currently on an anti-coagulation regimen.    RECENT RESULTS: Recent results are below, the most recent result is correlated with a dose of 35 mg. per week: Lab Results  Component Value Date   INR 4.00 05/03/2013   INR 2.10 04/12/2013   INR 2.10 03/24/2013   PROTIME 30.0* 05/15/2012    ANTI-COAG DOSE: Anticoagulation Dose Instructions as of 05/03/2013     Sun Mon Tue Wed Thu Fri Sat   New Dose 5 mg 2.5 mg 5 mg 5 mg 2.5 mg 5 mg 5 mg       ANTICOAG SUMMARY: Anticoagulation Episode Summary   Current INR goal 2.0-3.0  Next INR check 05/24/2013  INR from last check 4.00! (05/03/2013)  Weekly max dose   Target end date Indefinite  [facility]  Preferred lab   Send INR reminders to    Indications  CVA (cerebral infarction) [434.91] Long term (current) use of anticoagulants [V58.61]        Comments         ANTICOAG TODAY: Anticoagulation Summary as of 05/03/2013   INR goal 2.0-3.0  Selected INR 4.00! (05/03/2013)  Next INR check 05/24/2013  Target end date Indefinite   Indications  CVA (cerebral infarction) [434.91] Long term (current) use of anticoagulants [V58.61]      Anticoagulation Episode Summary   [facility] check [facility]   Preferred lab    Send INR reminders to    Comments       PATIENT INSTRUCTIONS: Patient Instructions  Patient instructed to take medications as defined in the Anti-coagulation Track section of this encounter.  Patient instructed to take today's dose.  Patient verbalized understanding of these instructions.       FOLLOW-UP Return in 3 weeks (on 05/24/2013) for Follow up INR at 1145.  [name], III Pharm.D., CACP  Anti-Coagulation Progress Note  Jonathan Reed is a 68 y.o. male who is currently on an anti-coagulation regimen.    RECENT RESULTS: Recent results are below, the most recent result is correlated with a dose of *** mg. per  week: Lab Results  Component Value Date   INR 4.00 05/03/2013   INR 2.10 04/12/2013   INR 2.10 03/24/2013   PROTIME 30.0* 05/15/2012    ANTI-COAG DOSE: Anticoagulation Dose Instructions as of 05/03/2013     Sun Mon Tue Wed Thu Fri Sat   New Dose 5 mg 2.5 mg 5 mg 5 mg 2.5 mg 5 mg 5 mg       ANTICOAG SUMMARY: Anticoagulation Episode Summary   Current INR goal 2.0-3.0  Next INR check 05/24/2013  INR from last check 4.00! (05/03/2013)  Weekly max dose   Target end date Indefinite  [facility]  Preferred lab   Send INR reminders to    Indications  CVA (cerebral infarction) [434.91] Long term (current) use of anticoagulants [V58.61]        Comments         ANTICOAG TODAY: Anticoagulation Summary as of 05/03/2013   INR goal 2.0-3.0  Selected INR 4.00! (05/03/2013)  Next INR check 05/24/2013  Target end date Indefinite   Indications  CVA (cerebral infarction) [434.91] Long term (current) use of anticoagulants [V58.61]      Anticoagulation Episode Summary   [facility] check [facility]   Preferred lab    Send INR reminders to    Comments  PATIENT INSTRUCTIONS: Patient Instructions  Patient instructed to take medications as defined in the Anti-coagulation Track section of this encounter.  Patient instructed to take today's dose.  Patient verbalized understanding of these instructions.       FOLLOW-UP Return in 3 weeks (on 05/24/2013) for Follow up INR at 1145.  [name], III Pharm.D., CACP

## 2013-05-06 ENCOUNTER — Other Ambulatory Visit: Payer: Self-pay | Admitting: *Deleted

## 2013-05-06 DIAGNOSIS — I255 Ischemic cardiomyopathy: Secondary | ICD-10-CM

## 2013-05-06 DIAGNOSIS — C7B8 Other secondary neuroendocrine tumors: Secondary | ICD-10-CM

## 2013-05-06 MED ORDER — CAPECITABINE 500 MG PO TABS: 2000.0000 mg | ORAL_TABLET | Freq: Two times a day (BID) | ORAL | Status: DC

## 2013-05-10 ENCOUNTER — Ambulatory Visit (HOSPITAL_BASED_OUTPATIENT_CLINIC_OR_DEPARTMENT_OTHER): Payer: Medicare Other | Admitting: Hematology and Oncology

## 2013-05-10 ENCOUNTER — Other Ambulatory Visit (HOSPITAL_BASED_OUTPATIENT_CLINIC_OR_DEPARTMENT_OTHER): Payer: Medicare Other

## 2013-05-10 ENCOUNTER — Telehealth: Payer: Self-pay | Admitting: Hematology and Oncology

## 2013-05-10 VITALS — BP 134/91 | HR 54 | Temp 98.0°F | Resp 20 | Ht 70.0 in | Wt 213.7 lb

## 2013-05-10 DIAGNOSIS — C7A00 Malignant carcinoid tumor of unspecified site: Secondary | ICD-10-CM

## 2013-05-10 DIAGNOSIS — C7B8 Other secondary neuroendocrine tumors: Secondary | ICD-10-CM

## 2013-05-10 DIAGNOSIS — D709 Neutropenia, unspecified: Secondary | ICD-10-CM

## 2013-05-10 DIAGNOSIS — D696 Thrombocytopenia, unspecified: Secondary | ICD-10-CM

## 2013-05-10 DIAGNOSIS — C787 Secondary malignant neoplasm of liver and intrahepatic bile duct: Secondary | ICD-10-CM

## 2013-05-10 DIAGNOSIS — D3A00 Benign carcinoid tumor of unspecified site: Secondary | ICD-10-CM

## 2013-05-10 DIAGNOSIS — R109 Unspecified abdominal pain: Secondary | ICD-10-CM

## 2013-05-10 DIAGNOSIS — R110 Nausea: Secondary | ICD-10-CM

## 2013-05-10 DIAGNOSIS — R944 Abnormal results of kidney function studies: Secondary | ICD-10-CM

## 2013-05-10 LAB — CBC WITH DIFFERENTIAL/PLATELET
BASO%: 0.7 % (ref 0.0–2.0)
Basophils Absolute: 0 10*3/uL (ref 0.0–0.1)
EOS%: 2.4 % (ref 0.0–7.0)
Eosinophils Absolute: 0.1 10*3/uL (ref 0.0–0.5)
HCT: 41.7 % (ref 38.4–49.9)
HGB: 14.3 g/dL (ref 13.0–17.1)
LYMPH%: 28.5 % (ref 14.0–49.0)
MCH: 35.9 pg — AB (ref 27.2–33.4)
MCHC: 34.2 g/dL (ref 32.0–36.0)
MCV: 104.8 fL — ABNORMAL HIGH (ref 79.3–98.0)
MONO#: 0.7 10*3/uL (ref 0.1–0.9)
MONO%: 20.6 % — ABNORMAL HIGH (ref 0.0–14.0)
NEUT#: 1.7 10*3/uL (ref 1.5–6.5)
NEUT%: 47.8 % (ref 39.0–75.0)
PLATELETS: 135 10*3/uL — AB (ref 140–400)
RBC: 3.97 10*6/uL — ABNORMAL LOW (ref 4.20–5.82)
RDW: 16.8 % — ABNORMAL HIGH (ref 11.0–14.6)
WBC: 3.5 10*3/uL — ABNORMAL LOW (ref 4.0–10.3)
lymph#: 1 10*3/uL (ref 0.9–3.3)

## 2013-05-10 LAB — COMPREHENSIVE METABOLIC PANEL (CC13)
ALK PHOS: 64 U/L (ref 40–150)
ALT: 23 U/L (ref 0–55)
ANION GAP: 11 meq/L (ref 3–11)
AST: 23 U/L (ref 5–34)
Albumin: 4 g/dL (ref 3.5–5.0)
BUN: 14.8 mg/dL (ref 7.0–26.0)
CO2: 25 meq/L (ref 22–29)
Calcium: 10 mg/dL (ref 8.4–10.4)
Chloride: 106 mEq/L (ref 98–109)
Creatinine: 1.4 mg/dL — ABNORMAL HIGH (ref 0.7–1.3)
Glucose: 95 mg/dl (ref 70–140)
POTASSIUM: 4 meq/L (ref 3.5–5.1)
SODIUM: 141 meq/L (ref 136–145)
TOTAL PROTEIN: 7.4 g/dL (ref 6.4–8.3)
Total Bilirubin: 0.54 mg/dL (ref 0.20–1.20)

## 2013-05-10 MED ORDER — OXYCODONE HCL 5 MG PO TABS: 5.0000 mg | ORAL_TABLET | Freq: Four times a day (QID) | ORAL | Status: DC | PRN

## 2013-05-10 MED ORDER — DOCUSATE SODIUM 100 MG PO CAPS: 100.0000 mg | ORAL_CAPSULE | Freq: Two times a day (BID) | ORAL | Status: DC

## 2013-05-10 MED ORDER — ONDANSETRON HCL 4 MG PO TABS: 4.0000 mg | ORAL_TABLET | Freq: Two times a day (BID) | ORAL | Status: DC | PRN

## 2013-05-10 NOTE — Telephone Encounter (Signed)
Gave pt appt for lab and MD for [facility] 2015 °

## 2013-05-10 NOTE — Progress Notes (Signed)
[name] OFFICE PROGRESS NOTE  Patient Care Team: Blain Pais, MD as PCP - General (Internal Medicine) Estill Dooms as Referring Physician (Surgery) Heath Lark, MD as Consulting Physician (Hematology and Oncology)  DIAGNOSIS: Metastatic well-differentiated neuroendocrine tumor to the liver, ongoing chemotherapy with Xeloda and Temodar  SUMMARY OF ONCOLOGIC HISTORY: In January of 2014 the patient was hospitalized for shortness of breath. He presented also with some epigastric pain and imaging studies review abnormal liver mass.  On 04/15/2012 he underwent biopsy which showed well differentiated neuroendocrine tumor.  Since February 2014, he was placed on palliative chemotherapy with Xeloda 1000 mg per meter squared twice a day for 2 weeks, off 2 weeks and Temodar 200 mg per meter squared daily on days 10-14, each cycle is a 28 day cycle.  May 2014 CT scan show partial response.  September 2014 repeat imaging study show continued response to treatment. January 2015, repeat imaging study showed mixed response  INTERVAL HISTORY: Jonathan Reed 68 y.o. Reed returns for further followup. He complained of some mild nausea on the days he has to take Xeloda. The patient denies any mouth sores, nausea, vomiting or change in bowel habits The patient remained on anticoagulation therapy for his stroke. The patient denies any recent signs or symptoms of bleeding such as spontaneous epistaxis, hematuria or hematochezia.  I have reviewed the past medical history, past surgical history, social history and family history with the patient and they are unchanged from previous note.  ALLERGIES:  has No Known Allergies.  MEDICATIONS:  Current Outpatient Prescriptions  Medication Sig Dispense Refill  . capecitabine (XELODA) 500 MG tablet Take 4 tablets (2,000 mg total) by mouth 2 (two) times daily after a meal. Take twice daily for 14 days on and then 14 days off each cycle.  112  tablet  5  . docusate sodium (DOCQLACE) 100 MG capsule Take 1 capsule (100 mg total) by mouth 2 (two) times daily.  60 capsule  6  . lisinopril-hydrochlorothiazide (PRINZIDE,ZESTORETIC) 20-25 MG per tablet Take 0.5 tablets by mouth daily.  90 tablet  2  . metoprolol succinate (TOPROL-XL) 25 MG 24 hr tablet Take 50 mg by mouth daily.      . ondansetron (ZOFRAN) 4 MG tablet Take 1 tablet (4 mg total) by mouth every 12 (twelve) hours as needed for nausea.  60 tablet  6  . oxyCODONE (ROXICODONE) 5 MG immediate release tablet Take 1 tablet (5 mg total) by mouth every 6 (six) hours as needed.  30 tablet  0  . prochlorperazine (COMPAZINE) 10 MG tablet Take 1 tablet (10 mg total) by mouth every 6 (six) hours as needed.  30 tablet  3  . spironolactone (ALDACTONE) 25 MG tablet Take 25 mg by mouth daily.      Marland Kitchen temozolomide (TEMODAR) 100 MG capsule Take 4 capsules (400 mg total) by mouth daily. May take on an empty stomach or at bedtime to decrease nausea & vomiting.  Take daily for 5 days on days 10 thru 14 of Xeloda.  20 capsule  0  . warfarin (COUMADIN) 5 MG tablet Take 1/2 tablet on Monday/Wednesday; Take 1 tablet all other days! **Patient requests 90 day supply**  72 tablet  3   No current facility-administered medications for this visit.    REVIEW OF SYSTEMS:   Constitutional: Denies fevers, chills or abnormal weight loss Eyes: Denies blurriness of vision Ears, nose, mouth, throat, and face: Denies mucositis or sore throat Respiratory: Denies cough,  dyspnea or wheezes Cardiovascular: Denies palpitation, chest discomfort or lower extremity swelling Skin: Denies abnormal skin rashes Lymphatics: Denies new lymphadenopathy or easy bruising Neurological:Denies numbness, tingling or new weaknesses Behavioral/Psych: Mood is stable, no new changes  All other systems were reviewed with the patient and are negative.  PHYSICAL EXAMINATION: ECOG PERFORMANCE STATUS: 1 - Symptomatic but completely  ambulatory  Filed Vitals:   05/10/13 0914  BP: 134/91  Pulse: 54  Temp: 98 F (36.7 C)  Resp: 20   Filed Weights   05/10/13 0914  Weight: 213 lb 11.2 oz (96.934 kg)    GENERAL:alert, no distress and comfortable SKIN: skin color, texture, turgor are normal, no rashes or significant lesions EYES: normal, Conjunctiva are pink and non-injected, sclera clear OROPHARYNX:no exudate, no erythema and lips, buccal mucosa, and tongue normal  NECK: supple, thyroid normal size, non-tender, without nodularity LYMPH:  no palpable lymphadenopathy in the cervical, axillary or inguinal LUNGS: clear to auscultation and percussion with normal breathing effort HEART: regular rate & rhythm and no murmurs and no lower extremity edema ABDOMEN:abdomen soft, non-tender and normal bowel sounds Musculoskeletal:no cyanosis of digits and no clubbing  NEURO: alert & oriented x 3 with fluent speech, no focal motor/sensory deficits  LABORATORY DATA:  I have reviewed the data as listed    Component Value Date/Time   NA 143 04/12/2013 0839   NA 136 04/16/2012 1016   K 3.9 04/12/2013 0839   K 4.1 04/16/2012 1016   CL 105 08/24/2012 0849   CL 101 04/16/2012 1016   CO2 25 04/12/2013 0839   CO2 25 04/16/2012 1016   GLUCOSE 91 04/12/2013 0839   GLUCOSE 100* 08/24/2012 0849   GLUCOSE 112* 04/16/2012 1016   BUN 12.7 04/12/2013 0839   BUN 21 04/16/2012 1016   CREATININE 1.3 04/12/2013 0839   CREATININE 1.09 04/16/2012 1016   CALCIUM 9.9 04/12/2013 0839   CALCIUM 9.3 04/16/2012 1016   PROT 7.2 04/12/2013 0839   PROT 7.1 04/16/2012 1016   ALBUMIN 3.9 04/12/2013 0839   ALBUMIN 2.7* 04/16/2012 1016   AST 20 04/12/2013 0839   AST 40* 04/16/2012 1016   ALT 21 04/12/2013 0839   ALT 35 04/16/2012 1016   ALKPHOS 63 04/12/2013 0839   ALKPHOS 203* 04/16/2012 1016   BILITOT 0.58 04/12/2013 0839   BILITOT 0.8 04/16/2012 1016   GFRNONAA 70* 04/16/2012 1016   GFRAA 81* 04/16/2012 1016    No results found for this basename: SPEP,  UPEP,    kappa and lambda light chains    Lab Results  Component Value Date   WBC 3.5* 05/10/2013   NEUTROABS 1.7 05/10/2013   HGB 14.3 05/10/2013   HCT 41.7 05/10/2013   MCV 104.8* 05/10/2013   PLT 135* 05/10/2013      Chemistry      Component Value Date/Time   NA 143 04/12/2013 0839   NA 136 04/16/2012 1016   K 3.9 04/12/2013 0839   K 4.1 04/16/2012 1016   CL 105 08/24/2012 0849   CL 101 04/16/2012 1016   CO2 25 04/12/2013 0839   CO2 25 04/16/2012 1016   BUN 12.7 04/12/2013 0839   BUN 21 04/16/2012 1016   CREATININE 1.3 04/12/2013 0839   CREATININE 1.09 04/16/2012 1016      Component Value Date/Time   CALCIUM 9.9 04/12/2013 0839   CALCIUM 9.3 04/16/2012 1016   ALKPHOS 63 04/12/2013 0839   ALKPHOS 203* 04/16/2012 1016   AST 20 04/12/2013 0839   AST  40* 04/16/2012 1016   ALT 21 04/12/2013 0839   ALT 35 04/16/2012 1016   BILITOT 0.58 04/12/2013 0839   BILITOT 0.8 04/16/2012 1016     ASSESSMENT & PLAN:  #1 metastatic neuroendocrine tumor The patient is responding well to treatment. He has minimal side effects at this point in time.  His most recent imaging study overall show mixed response and stable disease. I recommend we continue same treatment and repeat imaging again in 3 months. #2 history of stroke and cardiomyopathy He continue anticoagulation therapy. The patient has no evidence of bleeding #3 elevated creatinine  This could be due to recent dehydration. Recommend he increase oral fluid #4 mild thrombocytopenia  This is likely due to recent treatment. The patient denies recent history of bleeding such as epistaxis, hematuria or hematochezia. He is asymptomatic from the low platelet count. I will observe for now.  he does not require transfusion now. I will continue the chemotherapy at current dose without dosage adjustment.  If the thrombocytopenia gets progressive worse in the future, I might have to delay his treatment or adjust the chemotherapy dose. #5 leukopenia This is likely due to  recent treatment. The patient denies recent history of fevers, cough, chills, diarrhea or dysuria. He is asymptomatic from the leukopenia. I will observe for now.  I will continue the chemotherapy at current dose without dosage adjustment.  If the leukopenia gets progressive worse in the future, I might have to delay his treatment or adjust the chemotherapy dose.  Orders Placed This Encounter  Procedures  . CBC with Differential    Standing Status: Standing     Number of Occurrences: 9     Standing Expiration Date: 05/10/2014  . Comprehensive metabolic panel    Standing Status: Standing     Number of Occurrences: 9     Standing Expiration Date: 05/10/2014   All questions were answered. The patient knows to call the clinic with any problems, questions or concerns. No barriers to learning was detected. I spent 25 minutes counseling the patient face to face. The total time spent in the appointment was 40 minutes and more than 50% was on counseling and review of test results     [facility], [place], MD 05/10/2013 9:45 AM

## 2013-05-19 ENCOUNTER — Ambulatory Visit: Payer: Medicaid Other

## 2013-05-19 ENCOUNTER — Other Ambulatory Visit: Payer: Medicaid Other | Admitting: Lab

## 2013-05-24 ENCOUNTER — Ambulatory Visit (INDEPENDENT_AMBULATORY_CARE_PROVIDER_SITE_OTHER): Payer: Medicare Other | Admitting: Pharmacist

## 2013-05-24 DIAGNOSIS — I6350 Cerebral infarction due to unspecified occlusion or stenosis of unspecified cerebral artery: Secondary | ICD-10-CM

## 2013-05-24 DIAGNOSIS — Z7901 Long term (current) use of anticoagulants: Secondary | ICD-10-CM

## 2013-05-24 DIAGNOSIS — I639 Cerebral infarction, unspecified: Secondary | ICD-10-CM

## 2013-05-24 LAB — POCT INR: INR: 4.5

## 2013-05-24 NOTE — Progress Notes (Signed)
Anti-Coagulation Progress Note  APOSTOLOS BLAGG is a 68 y.o. male who is currently on an anti-coagulation regimen.    RECENT RESULTS: Recent results are below, the most recent result is correlated with a dose of 30 mg. per week: Lab Results  Component Value Date   INR 4.50 05/24/2013   INR 4.00 05/03/2013   INR 2.10 04/12/2013   PROTIME 30.0* 05/15/2012    ANTI-COAG DOSE: Anticoagulation Dose Instructions as of 05/24/2013     [name] Tue Wed Thu Fri Sat   New Dose 5 mg 5 mg 2.5 mg 2.5 mg 2.5 mg 2.5 mg 5 mg       ANTICOAG SUMMARY: Anticoagulation Episode Summary   Current INR goal 2.0-3.0  Next INR check 06/07/2013  INR from last check 4.50! (05/24/2013)  Weekly max dose   Target end date Indefinite  [facility]  Preferred lab   Send INR reminders to    Indications  CVA (cerebral infarction) [434.91] Long term (current) use of anticoagulants [V58.61]        Comments         ANTICOAG TODAY: Anticoagulation Summary as of 05/24/2013   INR goal 2.0-3.0  Selected INR 4.50! (05/24/2013)  Next INR check 06/07/2013  Target end date Indefinite   Indications  CVA (cerebral infarction) [434.91] Long term (current) use of anticoagulants [V58.61]      Anticoagulation Episode Summary   [facility]   Preferred lab    Send INR reminders to    Comments       PATIENT INSTRUCTIONS: Patient Instructions  Patient instructed to take medications as defined in the Anti-coagulation Track section of this encounter.  Patient instructed to OMIT today's dose.  Patient verbalized understanding of these instructions.       FOLLOW-UP Return in 2 weeks (on 06/07/2013) for Follow up INR at 1115h.  [name], III Pharm.D., CACP

## 2013-05-24 NOTE — Patient Instructions (Signed)
Patient instructed to take medications as defined in the Anti-coagulation Track section of this encounter.  Patient instructed to OMIT today's dose.  Patient verbalized understanding of these instructions.    

## 2013-06-07 ENCOUNTER — Ambulatory Visit (HOSPITAL_BASED_OUTPATIENT_CLINIC_OR_DEPARTMENT_OTHER): Payer: Medicare Other | Admitting: Hematology and Oncology

## 2013-06-07 ENCOUNTER — Other Ambulatory Visit (HOSPITAL_BASED_OUTPATIENT_CLINIC_OR_DEPARTMENT_OTHER): Payer: Medicare Other

## 2013-06-07 ENCOUNTER — Encounter: Payer: Self-pay | Admitting: Hematology and Oncology

## 2013-06-07 ENCOUNTER — Telehealth: Payer: Self-pay | Admitting: Hematology and Oncology

## 2013-06-07 ENCOUNTER — Ambulatory Visit (INDEPENDENT_AMBULATORY_CARE_PROVIDER_SITE_OTHER): Payer: Medicare Other | Admitting: Pharmacist

## 2013-06-07 VITALS — BP 147/85 | HR 61 | Temp 97.9°F | Resp 18 | Ht 70.0 in | Wt 212.4 lb

## 2013-06-07 DIAGNOSIS — D72819 Decreased white blood cell count, unspecified: Secondary | ICD-10-CM

## 2013-06-07 DIAGNOSIS — C7A00 Malignant carcinoid tumor of unspecified site: Secondary | ICD-10-CM

## 2013-06-07 DIAGNOSIS — I639 Cerebral infarction, unspecified: Secondary | ICD-10-CM

## 2013-06-07 DIAGNOSIS — Z7901 Long term (current) use of anticoagulants: Secondary | ICD-10-CM

## 2013-06-07 DIAGNOSIS — C787 Secondary malignant neoplasm of liver and intrahepatic bile duct: Secondary | ICD-10-CM

## 2013-06-07 DIAGNOSIS — D3A00 Benign carcinoid tumor of unspecified site: Secondary | ICD-10-CM

## 2013-06-07 DIAGNOSIS — R109 Unspecified abdominal pain: Secondary | ICD-10-CM

## 2013-06-07 DIAGNOSIS — C7B8 Other secondary neuroendocrine tumors: Secondary | ICD-10-CM

## 2013-06-07 DIAGNOSIS — Z8673 Personal history of transient ischemic attack (TIA), and cerebral infarction without residual deficits: Secondary | ICD-10-CM

## 2013-06-07 DIAGNOSIS — R110 Nausea: Secondary | ICD-10-CM

## 2013-06-07 LAB — COMPREHENSIVE METABOLIC PANEL (CC13)
ALBUMIN: 4 g/dL (ref 3.5–5.0)
ALK PHOS: 78 U/L (ref 40–150)
ALT: 19 U/L (ref 0–55)
AST: 20 U/L (ref 5–34)
Anion Gap: 12 mEq/L — ABNORMAL HIGH (ref 3–11)
BUN: 16.6 mg/dL (ref 7.0–26.0)
CALCIUM: 10 mg/dL (ref 8.4–10.4)
CHLORIDE: 106 meq/L (ref 98–109)
CO2: 26 mEq/L (ref 22–29)
Creatinine: 1.3 mg/dL (ref 0.7–1.3)
Glucose: 110 mg/dl (ref 70–140)
POTASSIUM: 3.9 meq/L (ref 3.5–5.1)
Sodium: 144 mEq/L (ref 136–145)
Total Bilirubin: 0.4 mg/dL (ref 0.20–1.20)
Total Protein: 7.5 g/dL (ref 6.4–8.3)

## 2013-06-07 LAB — CBC WITH DIFFERENTIAL/PLATELET
BASO%: 0 % (ref 0.0–2.0)
Basophils Absolute: 0 10*3/uL (ref 0.0–0.1)
EOS%: 2.5 % (ref 0.0–7.0)
Eosinophils Absolute: 0.1 10*3/uL (ref 0.0–0.5)
HCT: 41.1 % (ref 38.4–49.9)
HEMOGLOBIN: 13.9 g/dL (ref 13.0–17.1)
LYMPH%: 26.2 % (ref 14.0–49.0)
MCH: 34.9 pg — AB (ref 27.2–33.4)
MCHC: 33.8 g/dL (ref 32.0–36.0)
MCV: 103.3 fL — ABNORMAL HIGH (ref 79.3–98.0)
MONO#: 0.5 10*3/uL (ref 0.1–0.9)
MONO%: 15.4 % — AB (ref 0.0–14.0)
NEUT#: 1.8 10*3/uL (ref 1.5–6.5)
NEUT%: 55.9 % (ref 39.0–75.0)
Platelets: 160 10*3/uL (ref 140–400)
RBC: 3.98 10*6/uL — ABNORMAL LOW (ref 4.20–5.82)
RDW: 16.1 % — AB (ref 11.0–14.6)
WBC: 3.3 10*3/uL — ABNORMAL LOW (ref 4.0–10.3)
lymph#: 0.9 10*3/uL (ref 0.9–3.3)

## 2013-06-07 LAB — POCT INR: INR: 3

## 2013-06-07 NOTE — Telephone Encounter (Signed)
gv adn printed appt sched and avs for pt for April....gv pt barium °

## 2013-06-07 NOTE — Progress Notes (Signed)
Anti-Coagulation Progress Note  Jonathan Reed is a 68 y.o. male who is currently on an anti-coagulation regimen.    RECENT RESULTS: Recent results are below, the most recent result is correlated with a dose of 25 mg. per week: Lab Results  Component Value Date   INR 3.00 06/07/2013   INR 4.50 05/24/2013   INR 4.00 05/03/2013   PROTIME 30.0* 05/15/2012    ANTI-COAG DOSE: Anticoagulation Dose Instructions as of 06/07/2013     [name] Tue Wed Thu Fri Sat   New Dose 5 mg 2.5 mg 2.5 mg 2.5 mg 2.5 mg 2.5 mg 5 mg       ANTICOAG SUMMARY: Anticoagulation Episode Summary   Current INR goal 2.0-3.0  Next INR check 06/28/2013  INR from last check 3.00 (06/07/2013)  Weekly max dose   Target end date Indefinite  [facility]  Preferred lab   Send INR reminders to    Indications  CVA (cerebral infarction) [434.91] Long term (current) use of anticoagulants [V58.61]        Comments         ANTICOAG TODAY: Anticoagulation Summary as of 06/07/2013   INR goal 2.0-3.0  Selected INR 3.00 (06/07/2013)  Next INR check 06/28/2013  Target end date Indefinite   Indications  CVA (cerebral infarction) [434.91] Long term (current) use of anticoagulants [V58.61]      Anticoagulation Episode Summary   [facility]   Preferred lab    Send INR reminders to    Comments       PATIENT INSTRUCTIONS: Patient Instructions  Patient instructed to take medications as defined in the Anti-coagulation Track section of this encounter.  Patient instructed to take today's dose.  Patient verbalized understanding of these instructions.       FOLLOW-UP Return in about 3 weeks (around 06/28/2013) for Follow up INR at 1000h.  [name], III Pharm.D., CACP

## 2013-06-07 NOTE — Patient Instructions (Signed)
Patient instructed to take medications as defined in the Anti-coagulation Track section of this encounter.  Patient instructed to take today's dose.  Patient verbalized understanding of these instructions.    

## 2013-06-07 NOTE — Progress Notes (Signed)
Indication: Clinician concern about high risk for recurrent CVA. Duration: Lifelong per clinician decision. INR: At target. Agree with Dr. Gladstone Pih assessment and plan.

## 2013-06-07 NOTE — Progress Notes (Signed)
[place] OFFICE PROGRESS NOTE  Patient Care Team: Blain Pais, MD as PCP - General (Internal Medicine) Estill Dooms as Referring Physician (Surgery) Heath Lark, MD as Consulting Physician (Hematology and Oncology)  DIAGNOSIS: Metastatic neuroendocrine tumor to the liver, ongoing chemotherapy with Xeloda and Temodar.  SUMMARY OF ONCOLOGIC HISTORY: In January of 2014 the patient was hospitalized for shortness of breath. He presented also with some epigastric pain and imaging studies review abnormal liver mass.  On 04/15/2012 he underwent biopsy which showed well differentiated neuroendocrine tumor.  Since February 2014, he was placed on palliative chemotherapy with Xeloda 1000 mg per meter squared twice a day for 2 weeks, off 2 weeks and Temodar 200 mg per meter squared daily on days 10-14, each cycle is a 28 day cycle.  May 2014 CT scan show partial response.  September 2014 repeat imaging study show continued response to treatment. January 2015, repeat imaging study showed mixed response  INTERVAL HISTORY: Jonathan Reed 68 y.o. male returns for further followup. He denies any side effects from recent treatment. He takes anti-emetics before each dose of chemotherapy and denies any nausea. The patient remain on anticoagulation therapy for history of stroke.The patient denies any recent signs or symptoms of bleeding such as spontaneous epistaxis, hematuria or hematochezia.  I have reviewed the past medical history, past surgical history, social history and family history with the patient and they are unchanged from previous note.  ALLERGIES:  has No Known Allergies.  MEDICATIONS:  Current Outpatient Prescriptions  Medication Sig Dispense Refill  . capecitabine (XELODA) 500 MG tablet Take 4 tablets (2,000 mg total) by mouth 2 (two) times daily after a meal. Take twice daily for 14 days on and then 14 days off each cycle.  112 tablet  5  . docusate sodium (DOCQLACE)  100 MG capsule Take 1 capsule (100 mg total) by mouth 2 (two) times daily.  60 capsule  6  . lisinopril-hydrochlorothiazide (PRINZIDE,ZESTORETIC) 20-25 MG per tablet Take 0.5 tablets by mouth daily.  90 tablet  2  . metoprolol succinate (TOPROL-XL) 25 MG 24 hr tablet Take 50 mg by mouth daily.      . ondansetron (ZOFRAN) 4 MG tablet Take 1 tablet (4 mg total) by mouth every 12 (twelve) hours as needed for nausea.  60 tablet  6  . oxyCODONE (ROXICODONE) 5 MG immediate release tablet Take 1 tablet (5 mg total) by mouth every 6 (six) hours as needed.  30 tablet  0  . prochlorperazine (COMPAZINE) 10 MG tablet Take 1 tablet (10 mg total) by mouth every 6 (six) hours as needed.  30 tablet  3  . spironolactone (ALDACTONE) 25 MG tablet Take 25 mg by mouth daily.      Marland Kitchen temozolomide (TEMODAR) 100 MG capsule Take 4 capsules (400 mg total) by mouth daily. May take on an empty stomach or at bedtime to decrease nausea & vomiting.  Take daily for 5 days on days 10 thru 14 of Xeloda.  20 capsule  0  . warfarin (COUMADIN) 5 MG tablet Take 1/2 tablet on Monday/Wednesday; Take 1 tablet all other days! **Patient requests 90 day supply**  72 tablet  3   No current facility-administered medications for this visit.    REVIEW OF SYSTEMS:   Constitutional: Denies fevers, chills or abnormal weight loss Eyes: Denies blurriness of vision Ears, nose, mouth, throat, and face: Denies mucositis or sore throat Respiratory: Denies cough, dyspnea or wheezes Cardiovascular: Denies palpitation, chest discomfort  or lower extremity swelling Gastrointestinal:  Denies nausea, heartburn or change in bowel habits Skin: Denies abnormal skin rashes Lymphatics: Denies new lymphadenopathy or easy bruising Neurological:Denies numbness, tingling or new weaknesses Behavioral/Psych: Mood is stable, no new changes  All other systems were reviewed with the patient and are negative.  PHYSICAL EXAMINATION: ECOG PERFORMANCE STATUS: 0 -  Asymptomatic  Filed Vitals:   06/07/13 0843  BP: 147/85  Pulse: 61  Temp: 97.9 F (36.6 C)  Resp: 18   Filed Weights   06/07/13 0843  Weight: 212 lb 6.4 oz (96.344 kg)    GENERAL:alert, no distress and comfortable SKIN: skin color, texture, turgor are normal, no rashes or significant lesions EYES: normal, Conjunctiva are pink and non-injected, sclera clear OROPHARYNX:no exudate, no erythema and lips, buccal mucosa, and tongue normal  NECK: supple, thyroid normal size, non-tender, without nodularity LYMPH:  no palpable lymphadenopathy in the cervical, axillary or inguinal LUNGS: clear to auscultation and percussion with normal breathing effort HEART: regular rate & rhythm and no murmurs and no lower extremity edema ABDOMEN:abdomen soft, non-tender and normal bowel sounds Musculoskeletal:no cyanosis of digits and no clubbing  NEURO: alert & oriented x 3 with fluent speech, no focal motor/sensory deficits  LABORATORY DATA:  I have reviewed the data as listed    Component Value Date/Time   NA 141 05/10/2013 0858   NA 136 04/16/2012 1016   K 4.0 05/10/2013 0858   K 4.1 04/16/2012 1016   CL 105 08/24/2012 0849   CL 101 04/16/2012 1016   CO2 25 05/10/2013 0858   CO2 25 04/16/2012 1016   GLUCOSE 95 05/10/2013 0858   GLUCOSE 100* 08/24/2012 0849   GLUCOSE 112* 04/16/2012 1016   BUN 14.8 05/10/2013 0858   BUN 21 04/16/2012 1016   CREATININE 1.4* 05/10/2013 0858   CREATININE 1.09 04/16/2012 1016   CALCIUM 10.0 05/10/2013 0858   CALCIUM 9.3 04/16/2012 1016   PROT 7.4 05/10/2013 0858   PROT 7.1 04/16/2012 1016   ALBUMIN 4.0 05/10/2013 0858   ALBUMIN 2.7* 04/16/2012 1016   AST 23 05/10/2013 0858   AST 40* 04/16/2012 1016   ALT 23 05/10/2013 0858   ALT 35 04/16/2012 1016   ALKPHOS 64 05/10/2013 0858   ALKPHOS 203* 04/16/2012 1016   BILITOT 0.54 05/10/2013 0858   BILITOT 0.8 04/16/2012 1016   GFRNONAA 70* 04/16/2012 1016   GFRAA 81* 04/16/2012 1016    No results found for this basename: SPEP,  UPEP,    kappa and lambda light chains    Lab Results  Component Value Date   WBC 3.3* 06/07/2013   NEUTROABS 1.8 06/07/2013   HGB 13.9 06/07/2013   HCT 41.1 06/07/2013   MCV 103.3* 06/07/2013   PLT 160 06/07/2013      Chemistry      Component Value Date/Time   NA 141 05/10/2013 0858   NA 136 04/16/2012 1016   K 4.0 05/10/2013 0858   K 4.1 04/16/2012 1016   CL 105 08/24/2012 0849   CL 101 04/16/2012 1016   CO2 25 05/10/2013 0858   CO2 25 04/16/2012 1016   BUN 14.8 05/10/2013 0858   BUN 21 04/16/2012 1016   CREATININE 1.4* 05/10/2013 0858   CREATININE 1.09 04/16/2012 1016      Component Value Date/Time   CALCIUM 10.0 05/10/2013 0858   CALCIUM 9.3 04/16/2012 1016   ALKPHOS 64 05/10/2013 0858   ALKPHOS 203* 04/16/2012 1016   AST 23 05/10/2013 0858   AST 40*  04/16/2012 1016   ALT 23 05/10/2013 0858   ALT 35 04/16/2012 1016   BILITOT 0.54 05/10/2013 0858   BILITOT 0.8 04/16/2012 1016     ASSESSMENT & PLAN:  #1 metastatic neuroendocrine tumor The patient is responding well to treatment. He has minimal side effects at this point in time.  His most recent imaging study overall show mixed response and stable disease. I recommend we continue same treatment and repeat imaging again before he returns next month  #2 history of stroke and cardiomyopathy He continue anticoagulation therapy. The patient has no evidence of bleeding #3 elevated creatinine  This could be due to recent dehydration. Recommend he increase oral fluid. I recommend he hold his pressure medications and diuretics the day before, the day off and the day after CT scan to reduce his risk of contrast nephropathy. #4 leukopenia This is likely due to recent treatment. The patient denies recent history of fevers, cough, chills, diarrhea or dysuria. He is asymptomatic from the leukopenia. I will observe for now.  I will continue the chemotherapy at current dose without dosage adjustment.  If the leukopenia gets progressive worse in the future, I might  have to delay his treatment or adjust the chemotherapy dose.  Orders Placed This Encounter  Procedures  . CT Abdomen Pelvis W Contrast    Standing Status: Future     Number of Occurrences:      Standing Expiration Date: 09/07/2014    Order Specific Question:  Reason for Exam (SYMPTOM  OR DIAGNOSIS REQUIRED)    Answer:  neuroendocrine tumor in liver, assess response  to Rx    Order Specific Question:  Preferred imaging location?    Answer:  [facility]   All questions were answered. The patient knows to call the clinic with any problems, questions or concerns. No barriers to learning was detected. I spent 25 minutes counseling the patient face to face. The total time spent in the appointment was 40 minutes and more than 50% was on counseling and review of test results     [facility], [place], MD 06/07/2013 9:06 AM

## 2013-06-16 ENCOUNTER — Ambulatory Visit: Payer: Medicaid Other | Admitting: Oncology

## 2013-06-16 ENCOUNTER — Other Ambulatory Visit: Payer: Medicaid Other | Admitting: Lab

## 2013-06-25 ENCOUNTER — Telehealth: Payer: Self-pay | Admitting: *Deleted

## 2013-06-25 NOTE — Telephone Encounter (Signed)
Instructed daughter on holding Lisinopril and Aldactone day before CT scan, day of and day after next week for total of 3 days.  CT is on Friday 4/17 and she verbalized understanding of holding these meds on Thurs Fri and Sat next week.

## 2013-06-28 ENCOUNTER — Encounter: Payer: Self-pay | Admitting: Internal Medicine

## 2013-06-28 ENCOUNTER — Ambulatory Visit (INDEPENDENT_AMBULATORY_CARE_PROVIDER_SITE_OTHER): Payer: Medicare Other | Admitting: Internal Medicine

## 2013-06-28 ENCOUNTER — Ambulatory Visit (INDEPENDENT_AMBULATORY_CARE_PROVIDER_SITE_OTHER): Payer: Medicare Other | Admitting: Pharmacist

## 2013-06-28 VITALS — BP 112/87 | HR 56 | Temp 97.1°F | Wt 211.6 lb

## 2013-06-28 DIAGNOSIS — Z0000 Encounter for general adult medical examination without abnormal findings: Secondary | ICD-10-CM

## 2013-06-28 DIAGNOSIS — Z7901 Long term (current) use of anticoagulants: Secondary | ICD-10-CM

## 2013-06-28 DIAGNOSIS — C7A00 Malignant carcinoid tumor of unspecified site: Secondary | ICD-10-CM

## 2013-06-28 DIAGNOSIS — Z950 Presence of cardiac pacemaker: Secondary | ICD-10-CM

## 2013-06-28 DIAGNOSIS — I482 Chronic atrial fibrillation, unspecified: Secondary | ICD-10-CM | POA: Insufficient documentation

## 2013-06-28 DIAGNOSIS — I639 Cerebral infarction, unspecified: Secondary | ICD-10-CM

## 2013-06-28 DIAGNOSIS — Z79899 Other long term (current) drug therapy: Secondary | ICD-10-CM

## 2013-06-28 DIAGNOSIS — N182 Chronic kidney disease, stage 2 (mild): Secondary | ICD-10-CM | POA: Insufficient documentation

## 2013-06-28 DIAGNOSIS — Z23 Encounter for immunization: Secondary | ICD-10-CM

## 2013-06-28 DIAGNOSIS — C7B8 Other secondary neuroendocrine tumors: Secondary | ICD-10-CM

## 2013-06-28 DIAGNOSIS — E785 Hyperlipidemia, unspecified: Secondary | ICD-10-CM

## 2013-06-28 DIAGNOSIS — Z95810 Presence of automatic (implantable) cardiac defibrillator: Secondary | ICD-10-CM

## 2013-06-28 DIAGNOSIS — R7309 Other abnormal glucose: Secondary | ICD-10-CM

## 2013-06-28 DIAGNOSIS — I63239 Cerebral infarction due to unspecified occlusion or stenosis of unspecified carotid arteries: Secondary | ICD-10-CM | POA: Diagnosis present

## 2013-06-28 DIAGNOSIS — R635 Abnormal weight gain: Secondary | ICD-10-CM

## 2013-06-28 DIAGNOSIS — I6350 Cerebral infarction due to unspecified occlusion or stenosis of unspecified cerebral artery: Secondary | ICD-10-CM

## 2013-06-28 DIAGNOSIS — C787 Secondary malignant neoplasm of liver and intrahepatic bile duct: Secondary | ICD-10-CM

## 2013-06-28 DIAGNOSIS — R739 Hyperglycemia, unspecified: Secondary | ICD-10-CM

## 2013-06-28 DIAGNOSIS — I10 Essential (primary) hypertension: Secondary | ICD-10-CM

## 2013-06-28 DIAGNOSIS — N189 Chronic kidney disease, unspecified: Secondary | ICD-10-CM

## 2013-06-28 LAB — LIPID PANEL
CHOL/HDL RATIO: 5.2 ratio
Cholesterol: 162 mg/dL (ref 0–200)
HDL: 31 mg/dL — AB (ref >39)
Triglycerides: 461 mg/dL — ABNORMAL HIGH (ref <150)

## 2013-06-28 LAB — POCT GLYCOSYLATED HEMOGLOBIN (HGB A1C): HEMOGLOBIN A1C: 5.7

## 2013-06-28 LAB — POCT INR: INR: 2.5

## 2013-06-28 NOTE — Progress Notes (Signed)
Anti-Coagulation Progress Note  Jonathan Reed is a 68 y.o. male who is currently on an anti-coagulation regimen.    RECENT RESULTS: Recent results are below, the most recent result is correlated with a dose of 22.5 mg. per week: Lab Results  Component Value Date   INR 2.50 06/28/2013   INR 3.00 06/07/2013   INR 4.50 05/24/2013   PROTIME 30.0* 05/15/2012    ANTI-COAG DOSE: Anticoagulation Dose Instructions as of 06/28/2013     [name] Tue Wed Thu Fri Sat   New Dose 5 mg 2.5 mg 2.5 mg 2.5 mg 2.5 mg 2.5 mg 5 mg       ANTICOAG SUMMARY: Anticoagulation Episode Summary   Current INR goal 2.0-3.0  Next INR check 07/26/2013  INR from last check 2.50 (06/28/2013)  Weekly max dose   Target end date Indefinite  [facility]  Preferred lab   Send INR reminders to    Indications  CVA (cerebral infarction) [434.91] Long term (current) use of anticoagulants [V58.61]        Comments         ANTICOAG TODAY: Anticoagulation Summary as of 06/28/2013   INR goal 2.0-3.0  Selected INR 2.50 (06/28/2013)  Next INR check 07/26/2013  Target end date Indefinite   Indications  CVA (cerebral infarction) [434.91] Long term (current) use of anticoagulants [V58.61]      Anticoagulation Episode Summary   [facility]   Preferred lab    Send INR reminders to    Comments       PATIENT INSTRUCTIONS: Patient Instructions  Patient instructed to take medications as defined in the Anti-coagulation Track section of this encounter.  Patient instructed to take today's dose.  Patient verbalized understanding of these instructions.       FOLLOW-UP Return in 4 weeks (on 07/26/2013) for Follow up INR at 1000h.  [name], III Pharm.D., CACP

## 2013-06-28 NOTE — Assessment & Plan Note (Addendum)
Pt continues to follow up with Dr. Lamonte Sakai. Cancer is responding to treatment according to his last Oncology's visit. He has repeat CT abd/pelvis coming up before he returns for his Oncology appointment.

## 2013-06-28 NOTE — Assessment & Plan Note (Signed)
He had an ICD shock in December 2014 with syncope at that time. His metoprolol dose was increased then and he has had not recurrent ICD shock or syncope.   -Pt to return to clinic tomorrow (4/14) with all his medication bottles for updated medication reconciliation.  -Pt to follow up with Dr. Boyd Kerbs this month

## 2013-06-28 NOTE — Assessment & Plan Note (Signed)
Cr of 1.3-1.4 at baseline, GFR of 50s in January 2014 but has not had GFR checked since then.  -Ordered CMP with GFR -Will order urinalysis and urine micro/album ration tomorrow

## 2013-06-28 NOTE — Assessment & Plan Note (Addendum)
He continues to take warfarin and follows up with Dr. Elie Confer at the [facility].

## 2013-06-28 NOTE — Patient Instructions (Signed)
Patient instructed to take medications as defined in the Anti-coagulation Track section of this encounter.  Patient instructed to take today's dose.  Patient verbalized understanding of these instructions.    

## 2013-06-28 NOTE — Progress Notes (Signed)
   Subjective:    Patient ID: Jonathan Reed, male    DOB: 1946-02-10, 68 y.o.   MRN: [id number]  HPI Jonathan Reed is a pleasant 68 yr old man with PMH of CAD and CHF with ICM s/p BIV-ICD [employer] placement, HTN, and metastatic neuroendocrine tumor of the liver, who comes, accompanied by his daughter, for routine follow up care. He was last seen at the [facility] last year and since then he has continued to undergo radiation and chemo therapy and is being followed by Dr. Lamonte Sakai in Oncology. He had an ICD replacement last year in May with no complications. In December last year he had ICD shock with syncope. He is seen at [facility] (Dr. Boyd Kerbs) and at [facility] for Cardiology care. He denies any recent syncope or chest pain, he has follow up appointment with Dr. Boyd Kerbs this month.  He does not have his medication bottles with him during this visit and does not have a list of his medications.  He notes that he feels well except when he has to take some of his chemo drugs. His appetite has improved and he has gained some weight. He notes that he had gained too much weight and has been eating healthier since January this year.  He has had intermittent tooth ache (left upper tooth) for 6 months now but denies current tooth pain or abscess. Usually he has to take an antibiotic prior to dental work due to his ICD implant.  He denies pain anywhere in his body. He continues to work Glass blower/designer and continue to travel to [place] for two weeks at a time to work on [employer].   Review of Systems  Constitutional: Negative for fever, chills, diaphoresis, activity change, appetite change, fatigue and unexpected weight change.  HENT: Negative for mouth sores.   Respiratory: Negative for cough, shortness of breath and wheezing.   Cardiovascular: Negative for chest pain, palpitations and leg swelling.  Gastrointestinal: Negative for nausea, vomiting, abdominal pain, diarrhea, constipation and blood in stool.    Genitourinary: Negative for dysuria.  Skin:       Dry skin of bilateral LE and palms of his hands  Neurological: Negative for dizziness, weakness, light-headedness and headaches.  Psychiatric/Behavioral: Negative for agitation.       Objective:   Physical Exam  Nursing note and vitals reviewed. Constitutional: He is oriented to person, place, and time. He appears well-developed and well-nourished. No distress.  HENT:  Left upper gingiva and teeth with no abscess, no signs of infection  Eyes: Conjunctivae are normal. No scleral icterus.  Wearing glasses   Cardiovascular: Normal rate and regular rhythm.   Pulmonary/Chest: Effort normal and breath sounds normal. No respiratory distress. He has no wheezes. He has no rales.  Abdominal: Soft. Bowel sounds are normal. He exhibits no distension. There is no tenderness.  Musculoskeletal: He exhibits no edema and no tenderness.  Neurological: He is alert and oriented to person, place, and time.  Skin: Skin is warm and dry. No rash noted. He is not diaphoretic. No erythema. No pallor.  Dry skin of bilateral LE with no scaling, no erythema. Dark discoloration of bilateral palms of his hands with no cuts/bruises/peeling.   Psychiatric: He has a normal mood and affect. His behavior is normal.          Assessment & Plan:

## 2013-06-28 NOTE — Patient Instructions (Addendum)
General Instructions: -Follow up with Dr. Lamonte Sakai in regards to the need for colonoscopy.  -Bring your medication bottles with you to your next appointments.   Treatment Goals:  Goals (1 Years of Data) as of 06/28/13         As of Today 06/07/13 05/10/13 04/12/13 03/09/13     Blood Pressure    . Blood Pressure < 140/90  112/87 147/85 134/91 148/90 136/87      Progress Toward Treatment Goals:  Treatment Goal 06/28/2013  Blood pressure at goal    Self Care Goals & Plans:  Self Care Goal 06/28/2013  Manage my medications take my medicines as prescribed; refill my medications on time  Monitor my health keep track of my blood pressure  Eat healthy foods eat foods that are low in salt; eat baked foods instead of fried foods; drink diet soda or water instead of juice or soda  Be physically active -    No flowsheet data found.   Care Management & Community Referrals:  Referral 06/08/2012  Referrals made for care management support none needed  Referrals made to community resources -

## 2013-06-28 NOTE — Assessment & Plan Note (Addendum)
He received the PCV 13 vaccine during this visit and will need the PPSV 23 in 6 months.  He also received the Tdap vaccine during this visit.  He needs a colonoscopy, however, as he is still undergoing chemotherapy will defer this decision to this Oncologist. Pt advised to ask his Oncologist if colonoscopy indicated at this time.

## 2013-06-28 NOTE — Assessment & Plan Note (Signed)
BP Readings from Last 3 Encounters:  06/28/13 112/87  06/07/13 147/85  05/10/13 134/91    Lab Results  Component Value Date   NA 144 06/07/2013   K 3.9 06/07/2013   CREATININE 1.3 06/07/2013    Assessment: Blood pressure control: controlled Progress toward BP goal:  at goal Comments: BP well controlled today, he knows he takes 3 medications for this BP but he is not sure which ones. He will return to the [facility] clinic on 4/14 and bring his medication bottles for medication reconciliation.   Plan: Medications:  continue current medications Educational resources provided: brochure Self management tools provided: home blood pressure logbook Other plans: Pt to return for medication reconciliation.

## 2013-06-29 ENCOUNTER — Ambulatory Visit (INDEPENDENT_AMBULATORY_CARE_PROVIDER_SITE_OTHER): Payer: Medicare Other | Admitting: Internal Medicine

## 2013-06-29 ENCOUNTER — Encounter: Payer: Self-pay | Admitting: Internal Medicine

## 2013-06-29 VITALS — BP 129/88 | HR 61 | Temp 97.5°F | Ht 71.0 in | Wt 213.4 lb

## 2013-06-29 DIAGNOSIS — I509 Heart failure, unspecified: Secondary | ICD-10-CM

## 2013-06-29 DIAGNOSIS — C229 Malignant neoplasm of liver, not specified as primary or secondary: Secondary | ICD-10-CM

## 2013-06-29 DIAGNOSIS — E785 Hyperlipidemia, unspecified: Secondary | ICD-10-CM

## 2013-06-29 DIAGNOSIS — I10 Essential (primary) hypertension: Secondary | ICD-10-CM

## 2013-06-29 DIAGNOSIS — N189 Chronic kidney disease, unspecified: Secondary | ICD-10-CM

## 2013-06-29 LAB — COMPLETE METABOLIC PANEL WITH GFR
ALT: 15 U/L (ref 0–53)
AST: 17 U/L (ref 0–37)
Albumin: 4.5 g/dL (ref 3.5–5.2)
Alkaline Phosphatase: 60 U/L (ref 39–117)
BUN: 19 mg/dL (ref 6–23)
CALCIUM: 10.2 mg/dL (ref 8.4–10.5)
CHLORIDE: 103 meq/L (ref 96–112)
CO2: 21 meq/L (ref 19–32)
Creat: 1.43 mg/dL — ABNORMAL HIGH (ref 0.50–1.35)
GFR, EST AFRICAN AMERICAN: 59 mL/min — AB
GFR, Est Non African American: 51 mL/min — ABNORMAL LOW
Glucose, Bld: 86 mg/dL (ref 70–99)
Potassium: 4.1 mEq/L (ref 3.5–5.3)
SODIUM: 138 meq/L (ref 135–145)
TOTAL PROTEIN: 7.5 g/dL (ref 6.0–8.3)
Total Bilirubin: 0.6 mg/dL (ref 0.2–1.2)

## 2013-06-29 LAB — LDL CHOLESTEROL, DIRECT: Direct LDL: 48 mg/dL

## 2013-06-29 MED ORDER — LISINOPRIL-HYDROCHLOROTHIAZIDE 20-25 MG PO TABS: 1.0000 | ORAL_TABLET | Freq: Every day | ORAL | Status: DC

## 2013-06-29 MED ORDER — METOPROLOL SUCCINATE ER 50 MG PO TB24: 50.0000 mg | ORAL_TABLET | Freq: Every day | ORAL | Status: AC

## 2013-06-29 MED ORDER — SULFAMETHOXAZOLE-TRIMETHOPRIM 800-160 MG PO TABS: ORAL_TABLET | ORAL | Status: DC

## 2013-06-29 NOTE — Patient Instructions (Signed)
Thank you for bringing your medicines today. This helps [place] keep you safe from mistakes. -Follow up in 3 months.

## 2013-06-29 NOTE — Addendum Note (Signed)
Addended by: Adele Barthel D on: 06/29/2013 09:59 AM   Modules accepted: Orders

## 2013-06-30 DIAGNOSIS — E785 Hyperlipidemia, unspecified: Secondary | ICD-10-CM | POA: Insufficient documentation

## 2013-06-30 LAB — URINALYSIS, ROUTINE W REFLEX MICROSCOPIC
BILIRUBIN URINE: NEGATIVE
GLUCOSE, UA: NEGATIVE mg/dL
Hgb urine dipstick: NEGATIVE
Ketones, ur: NEGATIVE mg/dL
Leukocytes, UA: NEGATIVE
Nitrite: NEGATIVE
Protein, ur: NEGATIVE mg/dL
SPECIFIC GRAVITY, URINE: 1.028 (ref 1.005–1.030)
UROBILINOGEN UA: 0.2 mg/dL (ref 0.0–1.0)
pH: 5.5 (ref 5.0–8.0)

## 2013-06-30 LAB — MICROALBUMIN / CREATININE URINE RATIO
Creatinine, Urine: 406 mg/dL
Microalb Creat Ratio: 2.8 mg/g (ref 0.0–30.0)
Microalb, Ur: 1.12 mg/dL (ref 0.00–1.89)

## 2013-06-30 NOTE — Assessment & Plan Note (Signed)
His triglycerides were high at 461 with HDL of 31 but total cholesterol of 162, LDL of 48. His 10-yr ASCVD risk is 15.9% and a moderate to high intensity statin therapy is indicated, however, given his ongoing chemotherapy treatment for malignant neuroendocrine liver tumor, a statin is contraindicated. He has already begun lifestyle modification but eating healthier foods (has cut sodas and sweets). He reports eating monosaturated fats, I suspect his HDL is low 2/2 BB treatment which, of course, cannot be discontinued with his CAD. Omega 3 fatty acis/fish oild can interfere with his warfarin and we will not initiate this tx at this time.  Will continue monitoring his progress with his chemo and start statin tx once/if he reaches successful cancer suppression/remission.

## 2013-06-30 NOTE — Assessment & Plan Note (Addendum)
Ordered urinalysis and urine micro/cre ratio.  UA with no protein, urine microalbumin/creatinine ratio is normal. He does not need ACEi

## 2013-06-30 NOTE — Progress Notes (Signed)
Case discussed with Dr. Kennerly at the time of the visit.  We reviewed the resident's history and exam and pertinent patient test results.  I agree with the assessment, diagnosis, and plan of care documented in the resident's note. 

## 2013-06-30 NOTE — Progress Notes (Signed)
   Subjective:    Patient ID: Jonathan Reed, male    DOB: 1945/09/12, 68 y.o.   MRN: [id number]  HPI Jonathan Reed is a 68 yr old man with PMH of A. Fib, HTN, metastatic malignant neuroendocrine tumor of the liver who was seen on 4/13 at the [facility] clinic but returns, accompanied by his wife, for medication reconciliation. He has no complaints since his visit the day before. Her has brought all his medication bottles with him and we reviewed them together and I made appropriate changes to this medication list.    Review of Systems      Objective:   Physical Exam  Nursing note and vitals reviewed. Constitutional: He is oriented to person, place, and time. He appears well-developed and well-nourished. No distress.  Neurological: He is alert and oriented to person, place, and time. Coordination normal.  Skin: He is not diaphoretic.  Psychiatric: He has a normal mood and affect. His behavior is normal.          Assessment & Plan:

## 2013-07-01 ENCOUNTER — Telehealth: Payer: Self-pay | Admitting: *Deleted

## 2013-07-01 NOTE — Telephone Encounter (Signed)
Daughter, [name], called to clarify the order to hold pt's BP meds for 3 days (today, tomorrow and Saturday).  Pt has CT scan tomorrow.  Explained rationale of holding meds due to "hard on kidneys" with these BP meds and CT contrast together.  She verbalized understanding and will reinforce to her father.

## 2013-07-01 NOTE — Progress Notes (Signed)
Case discussed with Dr. Kennerly at the time of the visit.  We reviewed the resident's history and exam and pertinent patient test results.  I agree with the assessment, diagnosis, and plan of care documented in the resident's note. 

## 2013-07-02 ENCOUNTER — Encounter (HOSPITAL_COMMUNITY): Payer: Self-pay

## 2013-07-02 ENCOUNTER — Ambulatory Visit (HOSPITAL_COMMUNITY)
Admission: RE | Admit: 2013-07-02 | Discharge: 2013-07-02 | Disposition: A | Discharge status: Home / Self Care | Payer: Medicare Other | Source: Ambulatory Visit | Attending: Hematology and Oncology | Admitting: Hematology and Oncology

## 2013-07-02 DIAGNOSIS — C7B8 Other secondary neuroendocrine tumors: Secondary | ICD-10-CM

## 2013-07-02 DIAGNOSIS — D7389 Other diseases of spleen: Secondary | ICD-10-CM | POA: Insufficient documentation

## 2013-07-02 DIAGNOSIS — K7689 Other specified diseases of liver: Secondary | ICD-10-CM | POA: Insufficient documentation

## 2013-07-02 DIAGNOSIS — C7A098 Malignant carcinoid tumors of other sites: Secondary | ICD-10-CM | POA: Insufficient documentation

## 2013-07-02 MED ORDER — IOHEXOL 300 MG/ML  SOLN
100.0000 mL | Freq: Once | INTRAMUSCULAR | Status: AC | PRN
  Administered 2013-07-02: 100 mL via INTRAVENOUS

## 2013-07-07 ENCOUNTER — Telehealth: Payer: Self-pay | Admitting: Hematology and Oncology

## 2013-07-07 ENCOUNTER — Other Ambulatory Visit (HOSPITAL_BASED_OUTPATIENT_CLINIC_OR_DEPARTMENT_OTHER): Payer: Medicare Other

## 2013-07-07 ENCOUNTER — Encounter: Payer: Self-pay | Admitting: Hematology and Oncology

## 2013-07-07 ENCOUNTER — Ambulatory Visit (HOSPITAL_BASED_OUTPATIENT_CLINIC_OR_DEPARTMENT_OTHER): Payer: Medicare Other | Admitting: Hematology and Oncology

## 2013-07-07 VITALS — BP 148/93 | HR 56 | Temp 97.8°F | Resp 19 | Ht 71.0 in | Wt 213.7 lb

## 2013-07-07 DIAGNOSIS — R110 Nausea: Secondary | ICD-10-CM

## 2013-07-07 DIAGNOSIS — C7A00 Malignant carcinoid tumor of unspecified site: Secondary | ICD-10-CM

## 2013-07-07 DIAGNOSIS — R109 Unspecified abdominal pain: Secondary | ICD-10-CM

## 2013-07-07 DIAGNOSIS — R944 Abnormal results of kidney function studies: Secondary | ICD-10-CM

## 2013-07-07 DIAGNOSIS — C7B8 Other secondary neuroendocrine tumors: Secondary | ICD-10-CM

## 2013-07-07 DIAGNOSIS — D3A00 Benign carcinoid tumor of unspecified site: Secondary | ICD-10-CM

## 2013-07-07 DIAGNOSIS — C787 Secondary malignant neoplasm of liver and intrahepatic bile duct: Secondary | ICD-10-CM

## 2013-07-07 DIAGNOSIS — D72819 Decreased white blood cell count, unspecified: Secondary | ICD-10-CM

## 2013-07-07 DIAGNOSIS — Z8673 Personal history of transient ischemic attack (TIA), and cerebral infarction without residual deficits: Secondary | ICD-10-CM

## 2013-07-07 DIAGNOSIS — D696 Thrombocytopenia, unspecified: Secondary | ICD-10-CM

## 2013-07-07 LAB — CBC WITH DIFFERENTIAL/PLATELET
BASO%: 0.5 % (ref 0.0–2.0)
BASOS ABS: 0 10*3/uL (ref 0.0–0.1)
EOS ABS: 0.1 10*3/uL (ref 0.0–0.5)
EOS%: 3.9 % (ref 0.0–7.0)
HCT: 40.8 % (ref 38.4–49.9)
HEMOGLOBIN: 13.6 g/dL (ref 13.0–17.1)
LYMPH#: 0.9 10*3/uL (ref 0.9–3.3)
LYMPH%: 25.4 % (ref 14.0–49.0)
MCH: 35.5 pg — ABNORMAL HIGH (ref 27.2–33.4)
MCHC: 33.3 g/dL (ref 32.0–36.0)
MCV: 106.5 fL — ABNORMAL HIGH (ref 79.3–98.0)
MONO#: 0.8 10*3/uL (ref 0.1–0.9)
MONO%: 23.2 % — AB (ref 0.0–14.0)
NEUT%: 47 % (ref 39.0–75.0)
NEUTROS ABS: 1.6 10*3/uL (ref 1.5–6.5)
Platelets: 134 10*3/uL — ABNORMAL LOW (ref 140–400)
RBC: 3.84 10*6/uL — AB (ref 4.20–5.82)
RDW: 17 % — AB (ref 11.0–14.6)
WBC: 3.4 10*3/uL — ABNORMAL LOW (ref 4.0–10.3)

## 2013-07-07 LAB — COMPREHENSIVE METABOLIC PANEL (CC13)
ALBUMIN: 4 g/dL (ref 3.5–5.0)
ALK PHOS: 67 U/L (ref 40–150)
ALT: 17 U/L (ref 0–55)
AST: 22 U/L (ref 5–34)
Anion Gap: 10 mEq/L (ref 3–11)
BUN: 17.9 mg/dL (ref 7.0–26.0)
CALCIUM: 9.9 mg/dL (ref 8.4–10.4)
CHLORIDE: 108 meq/L (ref 98–109)
CO2: 22 mEq/L (ref 22–29)
Creatinine: 1.4 mg/dL — ABNORMAL HIGH (ref 0.7–1.3)
Glucose: 95 mg/dl (ref 70–140)
POTASSIUM: 3.7 meq/L (ref 3.5–5.1)
SODIUM: 140 meq/L (ref 136–145)
TOTAL PROTEIN: 7.3 g/dL (ref 6.4–8.3)
Total Bilirubin: 0.51 mg/dL (ref 0.20–1.20)

## 2013-07-07 NOTE — Progress Notes (Signed)
[place] OFFICE PROGRESS NOTE  Patient Care Team: Blain Pais, MD as PCP - General (Internal Medicine) Estill Dooms, MD as Referring Physician (Surgery) Heath Lark, MD as Consulting Physician (Hematology and Oncology)  DIAGNOSIS: Metastatic neuroendocrine tumor to the liver  SUMMARY OF ONCOLOGIC HISTORY: In January of 2014 the patient was hospitalized for shortness of breath. He presented also with some epigastric pain and imaging studies review abnormal liver mass.  On 04/15/2012 he underwent biopsy which showed well differentiated neuroendocrine tumor.  Since February 2014, he was placed on palliative chemotherapy with Xeloda 1000 mg per meter squared twice a day for 2 weeks, off 2 weeks and Temodar 200 mg per meter squared daily on days 10-14, each cycle is a 28 day cycle.  May 2014 CT scan show partial response.  September 2014 repeat imaging study show continued response to treatment. January 2015, repeat imaging study showed mixed response In January 2015, repeat imaging study shows stable disease. After a lot of discussion, the patient is in agreement to stop treatment for chemotherapy holiday INTERVAL HISTORY: Jonathan Reed 68 y.o. male returns for further followup. He is feeling well apart from mild fatigue. He continues to take anti-emetics before each dose of chemotherapy therapy to prevent nausea. He remain on anticoagulation therapy for history of stroke. The patient denies any recent signs or symptoms of bleeding such as spontaneous epistaxis, hematuria or hematochezia.  I have reviewed the past medical history, past surgical history, social history and family history with the patient and they are unchanged from previous note.  ALLERGIES:  has No Known Allergies.  MEDICATIONS:  Current Outpatient Prescriptions  Medication Sig Dispense Refill  . Ascorbic Acid (VITAMIN C) 1000 MG tablet Take 1,000 mg by mouth.      . capecitabine (XELODA) 500 MG  tablet Take 4 tablets (2,000 mg total) by mouth 2 (two) times daily after a meal. Take twice daily for 14 days on and then 14 days off each cycle.  112 tablet  5  . docusate sodium (DOCQLACE) 100 MG capsule Take 1 capsule (100 mg total) by mouth 2 (two) times daily.  60 capsule  6  . lisinopril-hydrochlorothiazide (PRINZIDE,ZESTORETIC) 20-25 MG per tablet Take 1 tablet by mouth daily.  90 tablet  2  . metoprolol succinate (TOPROL-XL) 50 MG 24 hr tablet Take 1 tablet (50 mg total) by mouth daily. Take with or immediately following a meal.  30 tablet  2  . ondansetron (ZOFRAN) 4 MG tablet Take 1 tablet (4 mg total) by mouth every 12 (twelve) hours as needed for nausea.  60 tablet  6  . spironolactone (ALDACTONE) 25 MG tablet Take 25 mg by mouth daily.      Marland Kitchen temozolomide (TEMODAR) 100 MG capsule Take 4 capsules (400 mg total) by mouth daily. May take on an empty stomach or at bedtime to decrease nausea & vomiting.  Take daily for 5 days on days 10 thru 14 of Xeloda.  20 capsule  0  . warfarin (COUMADIN) 5 MG tablet Take 1/2 tablet on Monday/Wednesday; Take 1 tablet all other days! **Patient requests 90 day supply**  72 tablet  3  . oxyCODONE (ROXICODONE) 5 MG immediate release tablet Take 1 tablet (5 mg total) by mouth every 6 (six) hours as needed.  30 tablet  0   No current facility-administered medications for this visit.    REVIEW OF SYSTEMS:   Constitutional: Denies fevers, chills or abnormal weight loss Eyes: Denies blurriness  of vision Ears, nose, mouth, throat, and face: Denies mucositis or sore throat Respiratory: Denies cough, dyspnea or wheezes Cardiovascular: Denies palpitation, chest discomfort or lower extremity swelling Gastrointestinal:  Denies nausea, heartburn or change in bowel habits Skin: Denies abnormal skin rashes Lymphatics: Denies new lymphadenopathy or easy bruising Neurological:Denies numbness, tingling or new weaknesses Behavioral/Psych: Mood is stable, no new changes   All other systems were reviewed with the patient and are negative.  PHYSICAL EXAMINATION: ECOG PERFORMANCE STATUS: 0 - Asymptomatic  Filed Vitals:   07/07/13 0827  BP: 148/93  Pulse: 56  Temp: 97.8 F (36.6 C)  Resp: 19   Filed Weights   07/07/13 0827  Weight: 213 lb 11.2 oz (96.934 kg)    GENERAL:alert, no distress and comfortable SKIN: skin color, texture, turgor are normal, no rashes or significant lesions Musculoskeletal:no cyanosis of digits and no clubbing  NEURO: alert & oriented x 3 with fluent speech, no focal motor/sensory deficits  LABORATORY DATA:  I have reviewed the data as listed    Component Value Date/Time   NA 138 06/28/2013 1014   NA 144 06/07/2013 0826   K 4.1 06/28/2013 1014   K 3.9 06/07/2013 0826   CL 103 06/28/2013 1014   CL 105 08/24/2012 0849   CO2 21 06/28/2013 1014   CO2 26 06/07/2013 0826   GLUCOSE 86 06/28/2013 1014   GLUCOSE 110 06/07/2013 0826   GLUCOSE 100* 08/24/2012 0849   BUN 19 06/28/2013 1014   BUN 16.6 06/07/2013 0826   CREATININE 1.43* 06/28/2013 1014   CREATININE 1.3 06/07/2013 0826   CREATININE 1.09 04/16/2012 1016   CALCIUM 10.2 06/28/2013 1014   CALCIUM 10.0 06/07/2013 0826   PROT 7.5 06/28/2013 1014   PROT 7.5 06/07/2013 0826   ALBUMIN 4.5 06/28/2013 1014   ALBUMIN 4.0 06/07/2013 0826   AST 17 06/28/2013 1014   AST 20 06/07/2013 0826   ALT 15 06/28/2013 1014   ALT 19 06/07/2013 0826   ALKPHOS 60 06/28/2013 1014   ALKPHOS 78 06/07/2013 0826   BILITOT 0.6 06/28/2013 1014   BILITOT 0.40 06/07/2013 0826   GFRNONAA 51* 06/28/2013 1014   GFRNONAA 70* 04/16/2012 1016   GFRAA 59* 06/28/2013 1014   GFRAA 81* 04/16/2012 1016    No results found for this basename: SPEP,  UPEP,   kappa and lambda light chains    Lab Results  Component Value Date   WBC 3.4* 07/07/2013   NEUTROABS 1.6 07/07/2013   HGB 13.6 07/07/2013   HCT 40.8 07/07/2013   MCV 106.5* 07/07/2013   PLT 134* 07/07/2013      Chemistry      Component Value Date/Time   NA 138 06/28/2013  1014   NA 144 06/07/2013 0826   K 4.1 06/28/2013 1014   K 3.9 06/07/2013 0826   CL 103 06/28/2013 1014   CL 105 08/24/2012 0849   CO2 21 06/28/2013 1014   CO2 26 06/07/2013 0826   BUN 19 06/28/2013 1014   BUN 16.6 06/07/2013 0826   CREATININE 1.43* 06/28/2013 1014   CREATININE 1.3 06/07/2013 0826   CREATININE 1.09 04/16/2012 1016      Component Value Date/Time   CALCIUM 10.2 06/28/2013 1014   CALCIUM 10.0 06/07/2013 0826   ALKPHOS 60 06/28/2013 1014   ALKPHOS 78 06/07/2013 0826   AST 17 06/28/2013 1014   AST 20 06/07/2013 0826   ALT 15 06/28/2013 1014   ALT 19 06/07/2013 0826   BILITOT 0.6 06/28/2013 1014  BILITOT 0.40 06/07/2013 0826     RADIOGRAPHIC STUDIES: I reviewed the imaging study with him and his family which show stable disease I have personally reviewed the radiological images as listed and agreed with the findings in the report.  ASSESSMENT & PLAN:  #1 metastatic neuroendocrine tumor The patient is responding well to treatment. He has minimal side effects at this point in time.  His most recent imaging study overall show stable disease. I recommend consideration for chemotherapy holiday. He agreed. I will see him back in 3 months with history, physical examination and repeat blood work and imaging study. #2 history of stroke and cardiomyopathy He continue anticoagulation therapy. The patient has no evidence of bleeding #3 elevated creatinine  This could be due to recent dehydration. Recommend he increase oral fluid. #4 leukopenia and thrombocytopenia This is likely due to recent treatment. He denies symptoms. I suspect it will resolve once we discontinue treatment.  Orders Placed This Encounter  Procedures  . CT Abdomen Pelvis W Contrast    Standing Status: Future     Number of Occurrences:      Standing Expiration Date: 10/07/2014    Order Specific Question:  Reason for Exam (SYMPTOM  OR DIAGNOSIS REQUIRED)    Answer:  metastatic neuroendocrine cancer, exclude progression     Order Specific Question:  Preferred imaging location?    Answer:  [facility]  . CBC with Differential    Standing Status: Future     Number of Occurrences:      Standing Expiration Date: 07/07/2014  . Comprehensive metabolic panel    Standing Status: Future     Number of Occurrences:      Standing Expiration Date: 07/07/2014   All questions were answered. The patient knows to call the clinic with any problems, questions or concerns. No barriers to learning was detected. I spent 40 minutes counseling the patient face to face. The total time spent in the appointment was 55 minutes and more than 50% was on counseling and review of test results     Heath Lark, MD 07/07/2013 8:55 AM

## 2013-07-07 NOTE — Telephone Encounter (Signed)
Gave pt appt for lab and MD for July 2015, also gave pt oral contrast for CT

## 2013-07-21 ENCOUNTER — Ambulatory Visit: Payer: Medicaid Other

## 2013-07-21 ENCOUNTER — Other Ambulatory Visit: Payer: Medicaid Other | Admitting: Lab

## 2013-07-26 ENCOUNTER — Ambulatory Visit (INDEPENDENT_AMBULATORY_CARE_PROVIDER_SITE_OTHER): Payer: Medicare Other | Admitting: Pharmacist

## 2013-07-26 DIAGNOSIS — I639 Cerebral infarction, unspecified: Secondary | ICD-10-CM

## 2013-07-26 DIAGNOSIS — I6350 Cerebral infarction due to unspecified occlusion or stenosis of unspecified cerebral artery: Secondary | ICD-10-CM

## 2013-07-26 DIAGNOSIS — Z7901 Long term (current) use of anticoagulants: Secondary | ICD-10-CM

## 2013-07-26 LAB — POCT INR: INR: 1.8

## 2013-07-26 NOTE — Progress Notes (Signed)
Anti-Coagulation Progress Note  Jonathan Reed is a 68 y.o. male who is currently on an anti-coagulation regimen.    RECENT RESULTS: Recent results are below, the most recent result is correlated with a dose of 27.5 mg. per week: Lab Results  Component Value Date   INR 1.80 07/26/2013   INR 2.50 06/28/2013   INR 3.00 06/07/2013   PROTIME 30.0* 05/15/2012    ANTI-COAG DOSE: Anticoagulation Dose Instructions as of 07/26/2013     [name] Tue Wed Thu Fri Sat   New Dose 5 mg 2.5 mg 5 mg 2.5 mg 5 mg 2.5 mg 5 mg       ANTICOAG SUMMARY: Anticoagulation Episode Summary   Current INR goal 2.0-3.0  Next INR check 08/23/2013  INR from last check 1.80! (07/26/2013)  Weekly max dose   Target end date Indefinite  [facility]  Preferred lab   Send INR reminders to    Indications  CVA (cerebral infarction) [434.91] Long term (current) use of anticoagulants [V58.61]        Comments         ANTICOAG TODAY: Anticoagulation Summary as of 07/26/2013   INR goal 2.0-3.0  Selected INR 1.80! (07/26/2013)  Next INR check 08/23/2013  Target end date Indefinite   Indications  CVA (cerebral infarction) [434.91] Long term (current) use of anticoagulants [V58.61]      Anticoagulation Episode Summary   [facility]   Preferred lab    Send INR reminders to    Comments       PATIENT INSTRUCTIONS: Patient Instructions  Patient instructed to take medications as defined in the Anti-coagulation Track section of this encounter.  Patient instructed to take today's dose.  Patient verbalized understanding of these instructions.     FOLLOW-UP Return in 4 weeks (on 08/23/2013) for Follow up INR at 1015h.  [name], III Pharm.D., CACP

## 2013-07-26 NOTE — Patient Instructions (Signed)
Patient instructed to take medications as defined in the Anti-coagulation Track section of this encounter.  Patient instructed to take today's dose.  Patient verbalized understanding of these instructions.    

## 2013-08-18 ENCOUNTER — Other Ambulatory Visit: Payer: Medicaid Other | Admitting: Lab

## 2013-08-18 ENCOUNTER — Ambulatory Visit: Payer: Medicaid Other | Admitting: Oncology

## 2013-08-23 ENCOUNTER — Ambulatory Visit (INDEPENDENT_AMBULATORY_CARE_PROVIDER_SITE_OTHER): Payer: Medicare Other | Admitting: Pharmacist

## 2013-08-23 DIAGNOSIS — I639 Cerebral infarction, unspecified: Secondary | ICD-10-CM

## 2013-08-23 DIAGNOSIS — I6350 Cerebral infarction due to unspecified occlusion or stenosis of unspecified cerebral artery: Secondary | ICD-10-CM

## 2013-08-23 DIAGNOSIS — Z7901 Long term (current) use of anticoagulants: Secondary | ICD-10-CM

## 2013-08-23 LAB — POCT INR: INR: 2.1

## 2013-08-23 MED ORDER — WARFARIN SODIUM 5 MG PO TABS: ORAL_TABLET | ORAL | Status: DC

## 2013-08-23 NOTE — Progress Notes (Signed)
Anti-Coagulation Progress Note  Jonathan Reed is a 68 y.o. male who is currently on an anti-coagulation regimen.    RECENT RESULTS: Recent results are below, the most recent result is correlated with a dose of 27.5 mg. per week: Lab Results  Component Value Date   INR 2.1 08/23/2013   INR 1.80 07/26/2013   INR 2.50 06/28/2013   PROTIME 30.0* 05/15/2012    ANTI-COAG DOSE: Anticoagulation Dose Instructions as of 08/23/2013     [name] Tue Wed Thu Fri Sat   New Dose 5 mg 5 mg 5 mg 2.5 mg 5 mg 5 mg 5 mg       ANTICOAG SUMMARY: Anticoagulation Episode Summary   Current INR goal 2.0-3.0  Next INR check 09/20/2013  INR from last check 2.1 (08/23/2013)  Weekly max dose   Target end date Indefinite  [facility]  Preferred lab   Send INR reminders to    Indications  CVA (cerebral infarction) [434.91] Long term (current) use of anticoagulants [V58.61]        Comments         ANTICOAG TODAY: Anticoagulation Summary as of 08/23/2013   INR goal 2.0-3.0  Selected INR 2.1 (08/23/2013)  Next INR check 09/20/2013  Target end date Indefinite   Indications  CVA (cerebral infarction) [434.91] Long term (current) use of anticoagulants [V58.61]      Anticoagulation Episode Summary   [facility]   Preferred lab    Send INR reminders to    Comments       PATIENT INSTRUCTIONS: Patient Instructions  Patient instructed to take medications as defined in the Anti-coagulation Track section of this encounter.  Patient instructed to take today's dose.  Patient verbalized understanding of these instructions.       FOLLOW-UP Return in 4 weeks (on 09/20/2013) for Follow up INR at 1015h.  [name], III Pharm.D., CACP

## 2013-08-23 NOTE — Patient Instructions (Signed)
Patient instructed to take medications as defined in the Anti-coagulation Track section of this encounter.  Patient instructed to take today's dose.  Patient verbalized understanding of these instructions.    

## 2013-09-15 ENCOUNTER — Other Ambulatory Visit: Payer: Medicaid Other | Admitting: Lab

## 2013-09-15 ENCOUNTER — Ambulatory Visit: Payer: Medicaid Other

## 2013-09-20 ENCOUNTER — Ambulatory Visit (INDEPENDENT_AMBULATORY_CARE_PROVIDER_SITE_OTHER): Payer: Medicare Other | Admitting: Pharmacist

## 2013-09-20 DIAGNOSIS — I639 Cerebral infarction, unspecified: Secondary | ICD-10-CM

## 2013-09-20 DIAGNOSIS — I6350 Cerebral infarction due to unspecified occlusion or stenosis of unspecified cerebral artery: Secondary | ICD-10-CM

## 2013-09-20 DIAGNOSIS — Z7901 Long term (current) use of anticoagulants: Secondary | ICD-10-CM

## 2013-09-20 LAB — POCT INR: INR: 3

## 2013-09-20 NOTE — Patient Instructions (Signed)
Patient instructed to take medications as defined in the Anti-coagulation Track section of this encounter.  Patient instructed to take today's dose.  Patient verbalized understanding of these instructions.    

## 2013-09-20 NOTE — Progress Notes (Signed)
Anti-Coagulation Progress Note  Jonathan Reed is a 69 y.o. male who is currently on an anti-coagulation regimen.    RECENT RESULTS: Recent results are below, the most recent result is correlated with a dose of 32.5 mg. per week: Lab Results  Component Value Date   INR 3.00 09/20/2013   INR 2.1 08/23/2013   INR 1.80 07/26/2013   PROTIME 30.0* 05/15/2012    ANTI-COAG DOSE: Anticoagulation Dose Instructions as of 09/20/2013     Sun Mon Tue Wed Thu Fri Sat   New Dose 5 mg 2.5 mg 5 mg 5 mg 2.5 mg 5 mg 5 mg       ANTICOAG SUMMARY: Anticoagulation Episode Summary   Current INR goal 2.0-3.0  Next INR check 10/11/2013  INR from last check 3.00 (09/20/2013)  Weekly max dose   Target end date Indefinite  [facility]  Preferred lab   Send INR reminders to    Indications  CVA (cerebral infarction) [434.91] Long term (current) use of anticoagulants [V58.61]        Comments         ANTICOAG TODAY: Anticoagulation Summary as of 09/20/2013   INR goal 2.0-3.0  Selected INR 3.00 (09/20/2013)  Next INR check 10/11/2013  Target end date Indefinite   Indications  CVA (cerebral infarction) [434.91] Long term (current) use of anticoagulants [V58.61]      Anticoagulation Episode Summary   [facility]   Preferred lab    Send INR reminders to    Comments       PATIENT INSTRUCTIONS: Patient Instructions  Patient instructed to take medications as defined in the Anti-coagulation Track section of this encounter.  Patient instructed to take today's dose.  Patient verbalized understanding of these instructions.       FOLLOW-UP Return in 3 weeks (on 10/11/2013) for Follow up INR at 1000h.  [name], III Pharm.D., CACP

## 2013-09-23 NOTE — Progress Notes (Signed)
Jonathan Reed is on coumadin for afib and h/o CVA.  His INR was 3.0 and his coumadin was decreased slightly. I have reviewed Dr. Gladstone Pih note.

## 2013-09-28 ENCOUNTER — Telehealth: Payer: Self-pay | Admitting: *Deleted

## 2013-09-28 NOTE — Telephone Encounter (Signed)
Instructed dau to have pt hold his BP medication per Dr. Alvy Bimler the day before, day of and day after CT scan.  Pt to not take BP meds Thurs, Friday, Sat this week.  She verbalized understanding.

## 2013-09-28 NOTE — Telephone Encounter (Signed)
Pt has CT scan this Friday and was instructed last time he had CT to hold his BP meds for a few days.  Does pt need to hold BP meds this time?

## 2013-09-28 NOTE — Telephone Encounter (Signed)
The day before, day of and day after Ct scan

## 2013-10-01 ENCOUNTER — Other Ambulatory Visit (HOSPITAL_BASED_OUTPATIENT_CLINIC_OR_DEPARTMENT_OTHER): Payer: Medicare Other

## 2013-10-01 ENCOUNTER — Encounter (HOSPITAL_COMMUNITY): Payer: Self-pay

## 2013-10-01 ENCOUNTER — Ambulatory Visit (HOSPITAL_COMMUNITY)
Admission: RE | Admit: 2013-10-01 | Discharge: 2013-10-01 | Disposition: A | Discharge status: Home / Self Care | Payer: Medicare Other | Source: Ambulatory Visit | Attending: Hematology and Oncology | Admitting: Hematology and Oncology

## 2013-10-01 DIAGNOSIS — Z9221 Personal history of antineoplastic chemotherapy: Secondary | ICD-10-CM | POA: Diagnosis not present

## 2013-10-01 DIAGNOSIS — C7B8 Other secondary neuroendocrine tumors: Secondary | ICD-10-CM

## 2013-10-01 DIAGNOSIS — C787 Secondary malignant neoplasm of liver and intrahepatic bile duct: Secondary | ICD-10-CM | POA: Diagnosis present

## 2013-10-01 DIAGNOSIS — R109 Unspecified abdominal pain: Secondary | ICD-10-CM

## 2013-10-01 DIAGNOSIS — D3A00 Benign carcinoid tumor of unspecified site: Secondary | ICD-10-CM

## 2013-10-01 DIAGNOSIS — D72819 Decreased white blood cell count, unspecified: Secondary | ICD-10-CM

## 2013-10-01 DIAGNOSIS — R110 Nausea: Secondary | ICD-10-CM

## 2013-10-01 DIAGNOSIS — C7A00 Malignant carcinoid tumor of unspecified site: Secondary | ICD-10-CM

## 2013-10-01 LAB — COMPREHENSIVE METABOLIC PANEL (CC13)
ALK PHOS: 69 U/L (ref 40–150)
ALT: 27 U/L (ref 0–55)
AST: 19 U/L (ref 5–34)
Albumin: 3.6 g/dL (ref 3.5–5.0)
Anion Gap: 8 mEq/L (ref 3–11)
BILIRUBIN TOTAL: 0.46 mg/dL (ref 0.20–1.20)
BUN: 17.9 mg/dL (ref 7.0–26.0)
CO2: 26 mEq/L (ref 22–29)
Calcium: 9.5 mg/dL (ref 8.4–10.4)
Chloride: 107 mEq/L (ref 98–109)
Creatinine: 1.2 mg/dL (ref 0.7–1.3)
GLUCOSE: 99 mg/dL (ref 70–140)
Potassium: 3.9 mEq/L (ref 3.5–5.1)
Sodium: 141 mEq/L (ref 136–145)
Total Protein: 7 g/dL (ref 6.4–8.3)

## 2013-10-01 LAB — CBC WITH DIFFERENTIAL/PLATELET
BASO%: 0.7 % (ref 0.0–2.0)
BASOS ABS: 0 10*3/uL (ref 0.0–0.1)
EOS ABS: 0.1 10*3/uL (ref 0.0–0.5)
EOS%: 3 % (ref 0.0–7.0)
HCT: 43.6 % (ref 38.4–49.9)
HGB: 14.7 g/dL (ref 13.0–17.1)
LYMPH%: 22.1 % (ref 14.0–49.0)
MCH: 33.2 pg (ref 27.2–33.4)
MCHC: 33.7 g/dL (ref 32.0–36.0)
MCV: 98.5 fL — AB (ref 79.3–98.0)
MONO#: 0.6 10*3/uL (ref 0.1–0.9)
MONO%: 14.6 % — AB (ref 0.0–14.0)
NEUT%: 59.6 % (ref 39.0–75.0)
NEUTROS ABS: 2.6 10*3/uL (ref 1.5–6.5)
PLATELETS: 186 10*3/uL (ref 140–400)
RBC: 4.42 10*6/uL (ref 4.20–5.82)
RDW: 13.3 % (ref 11.0–14.6)
WBC: 4.4 10*3/uL (ref 4.0–10.3)
lymph#: 1 10*3/uL (ref 0.9–3.3)

## 2013-10-01 MED ORDER — IOHEXOL 300 MG/ML  SOLN
100.0000 mL | Freq: Once | INTRAMUSCULAR | Status: AC | PRN
  Administered 2013-10-01: 100 mL via INTRAVENOUS

## 2013-10-06 ENCOUNTER — Ambulatory Visit (HOSPITAL_BASED_OUTPATIENT_CLINIC_OR_DEPARTMENT_OTHER): Payer: Medicare Other | Admitting: Hematology and Oncology

## 2013-10-06 ENCOUNTER — Encounter: Payer: Self-pay | Admitting: Hematology and Oncology

## 2013-10-06 ENCOUNTER — Telehealth: Payer: Self-pay | Admitting: Hematology and Oncology

## 2013-10-06 VITALS — BP 141/88 | HR 50 | Temp 97.9°F | Resp 18 | Ht 71.0 in | Wt 209.0 lb

## 2013-10-06 DIAGNOSIS — I6350 Cerebral infarction due to unspecified occlusion or stenosis of unspecified cerebral artery: Secondary | ICD-10-CM

## 2013-10-06 DIAGNOSIS — R110 Nausea: Secondary | ICD-10-CM

## 2013-10-06 DIAGNOSIS — C7B8 Other secondary neuroendocrine tumors: Secondary | ICD-10-CM

## 2013-10-06 DIAGNOSIS — I639 Cerebral infarction, unspecified: Secondary | ICD-10-CM

## 2013-10-06 DIAGNOSIS — C7A00 Malignant carcinoid tumor of unspecified site: Secondary | ICD-10-CM

## 2013-10-06 DIAGNOSIS — C787 Secondary malignant neoplasm of liver and intrahepatic bile duct: Secondary | ICD-10-CM

## 2013-10-06 HISTORY — DX: Secondary malignant neoplasm of liver and intrahepatic bile duct: C78.7

## 2013-10-06 NOTE — Assessment & Plan Note (Signed)
This has resolved. He is not using any antiemetics.

## 2013-10-06 NOTE — Assessment & Plan Note (Signed)
Repeat imaging study showed slight disease progression but overall the patient is not symptomatic. He has normal liver function tests. I reviewed with him and family the current cancer guidelines. Due to lack of symptoms, he has an option of either: Observation with repeat imaging studies in several months versus octreotide injection. I gave him patient education handout regarding side effects of octreotide injection. The use of octreotide is not for cure and it might delay disease progression. Ultimately, the patient has made an informed decision not to pursue any therapy right now and will come back in 3 months with repeat history, physical examination, blood work and imaging study.

## 2013-10-06 NOTE — Assessment & Plan Note (Signed)
He has no long-term neurological deficit. He's doing well on anticoagulation therapy under the care of his internist. He denies bleeding complications.

## 2013-10-06 NOTE — Telephone Encounter (Signed)
gv relative appt schedule for oct. central will call w/ct - relative aware.

## 2013-10-06 NOTE — Progress Notes (Signed)
[facility] OFFICE PROGRESS NOTE  Patient Care Team: Blain Pais, MD as PCP - General (Internal Medicine) Estill Dooms, MD as Referring Physician (Surgery) Heath Lark, MD as Consulting Physician (Hematology and Oncology)  SUMMARY OF ONCOLOGIC HISTORY: In January of 2014 the patient was hospitalized for shortness of breath. He presented also with some epigastric pain and imaging studies review abnormal liver mass.  On 04/15/2012 he underwent biopsy which showed well differentiated neuroendocrine tumor.  Since February 2014, he was placed on palliative chemotherapy with Xeloda 1000 mg per meter squared twice a day for 2 weeks, off 2 weeks and Temodar 200 mg per meter squared daily on days 10-14, each cycle is a 28 day cycle.  May 2014 CT scan show partial response.  September 2014 repeat imaging study show continued response to treatment. January 2015, repeat imaging study showed mixed response In April 2015, repeat imaging study shows stable disease. After a lot of discussion, the patient is in agreement to stop treatment for chemotherapy holiday In July 2015, repeat imaging studies show mild disease progression. The patient elected for observation. INTERVAL HISTORY: Please see below for problem oriented charting. The patient is doing very well. His fatigue and nausea has resolved. He denies skin itching. He denies any nausea or change in bowel habits. Appetite stable denies any recent weight loss. He denies any flushing or diarrhea. No wheezing.  REVIEW OF SYSTEMS:   Constitutional: Denies fevers, chills or abnormal weight loss Eyes: Denies blurriness of vision Ears, nose, mouth, throat, and face: Denies mucositis or sore throat Respiratory: Denies cough, dyspnea or wheezes Cardiovascular: Denies palpitation, chest discomfort or lower extremity swelling Gastrointestinal:  Denies nausea, heartburn or change in bowel habits Skin: Denies abnormal skin  rashes Lymphatics: Denies new lymphadenopathy or easy bruising Neurological:Denies numbness, tingling or new weaknesses Behavioral/Psych: Mood is stable, no new changes  All other systems were reviewed with the patient and are negative.  I have reviewed the past medical history, past surgical history, social history and family history with the patient and they are unchanged from previous note.  ALLERGIES:  has No Known Allergies.  MEDICATIONS:  Current Outpatient Prescriptions  Medication Sig Dispense Refill  . lisinopril-hydrochlorothiazide (PRINZIDE,ZESTORETIC) 20-25 MG per tablet Take 1 tablet by mouth daily.  90 tablet  2  . metoprolol succinate (TOPROL-XL) 50 MG 24 hr tablet Take 1 tablet (50 mg total) by mouth daily. Take with or immediately following a meal.  30 tablet  2  . spironolactone (ALDACTONE) 25 MG tablet Take 25 mg by mouth daily.      Marland Kitchen warfarin (COUMADIN) 5 MG tablet Take 1/2 tablet on Wednesdays; Take 1 tablet all other days! **Patient requests 90 day supply**  90 tablet  3  . Ascorbic Acid (VITAMIN C) 1000 MG tablet Take 1,000 mg by mouth.      . docusate sodium (DOCQLACE) 100 MG capsule Take 1 capsule (100 mg total) by mouth 2 (two) times daily.  60 capsule  6  . ondansetron (ZOFRAN) 4 MG tablet Take 1 tablet (4 mg total) by mouth every 12 (twelve) hours as needed for nausea.  60 tablet  6  . oxyCODONE (ROXICODONE) 5 MG immediate release tablet Take 1 tablet (5 mg total) by mouth every 6 (six) hours as needed.  30 tablet  0   No current facility-administered medications for this visit.    PHYSICAL EXAMINATION: ECOG PERFORMANCE STATUS: 0 - Asymptomatic  Filed Vitals:   10/06/13 0845  BP:  141/88  Pulse: 50  Temp: 97.9 F (36.6 C)  Resp: 18   Filed Weights   10/06/13 0845  Weight: 209 lb (94.802 kg)    GENERAL:alert, no distress and comfortable SKIN: skin color, texture, turgor are normal, no rashes or significant lesions EYES: normal, Conjunctiva are  pink and non-injected, sclera clear OROPHARYNX:no exudate, no erythema and lips, buccal mucosa, and tongue normal  NECK: supple, thyroid normal size, non-tender, without nodularity LYMPH:  no palpable lymphadenopathy in the cervical, axillary or inguinal LUNGS: clear to auscultation and percussion with normal breathing effort HEART: regular rate & rhythm and no murmurs and no lower extremity edema ABDOMEN:abdomen soft, non-tender and normal bowel sounds Musculoskeletal:no cyanosis of digits and no clubbing  NEURO: alert & oriented x 3 with fluent speech, no focal motor/sensory deficits  LABORATORY DATA:  I have reviewed the data as listed    Component Value Date/Time   NA 141 10/01/2013 1008   NA 138 06/28/2013 1014   K 3.9 10/01/2013 1008   K 4.1 06/28/2013 1014   CL 103 06/28/2013 1014   CL 105 08/24/2012 0849   CO2 26 10/01/2013 1008   CO2 21 06/28/2013 1014   GLUCOSE 99 10/01/2013 1008   GLUCOSE 86 06/28/2013 1014   GLUCOSE 100* 08/24/2012 0849   BUN 17.9 10/01/2013 1008   BUN 19 06/28/2013 1014   CREATININE 1.2 10/01/2013 1008   CREATININE 1.43* 06/28/2013 1014   CREATININE 1.09 04/16/2012 1016   CALCIUM 9.5 10/01/2013 1008   CALCIUM 10.2 06/28/2013 1014   PROT 7.0 10/01/2013 1008   PROT 7.5 06/28/2013 1014   ALBUMIN 3.6 10/01/2013 1008   ALBUMIN 4.5 06/28/2013 1014   AST 19 10/01/2013 1008   AST 17 06/28/2013 1014   ALT 27 10/01/2013 1008   ALT 15 06/28/2013 1014   ALKPHOS 69 10/01/2013 1008   ALKPHOS 60 06/28/2013 1014   BILITOT 0.46 10/01/2013 1008   BILITOT 0.6 06/28/2013 1014   GFRNONAA 51* 06/28/2013 1014   GFRNONAA 70* 04/16/2012 1016   GFRAA 59* 06/28/2013 1014   GFRAA 81* 04/16/2012 1016    No results found for this basename: SPEP,  UPEP,   kappa and lambda light chains    Lab Results  Component Value Date   WBC 4.4 10/01/2013   NEUTROABS 2.6 10/01/2013   HGB 14.7 10/01/2013   HCT 43.6 10/01/2013   MCV 98.5* 10/01/2013   PLT 186 10/01/2013      Chemistry      Component Value  Date/Time   NA 141 10/01/2013 1008   NA 138 06/28/2013 1014   K 3.9 10/01/2013 1008   K 4.1 06/28/2013 1014   CL 103 06/28/2013 1014   CL 105 08/24/2012 0849   CO2 26 10/01/2013 1008   CO2 21 06/28/2013 1014   BUN 17.9 10/01/2013 1008   BUN 19 06/28/2013 1014   CREATININE 1.2 10/01/2013 1008   CREATININE 1.43* 06/28/2013 1014   CREATININE 1.09 04/16/2012 1016      Component Value Date/Time   CALCIUM 9.5 10/01/2013 1008   CALCIUM 10.2 06/28/2013 1014   ALKPHOS 69 10/01/2013 1008   ALKPHOS 60 06/28/2013 1014   AST 19 10/01/2013 1008   AST 17 06/28/2013 1014   ALT 27 10/01/2013 1008   ALT 15 06/28/2013 1014   BILITOT 0.46 10/01/2013 1008   BILITOT 0.6 06/28/2013 1014       RADIOGRAPHIC STUDIES: I reviewed the imaging study with him and children. I have personally reviewed  the radiological images as listed and agreed with the findings in the report.  ASSESSMENT & PLAN:  Metastatic malignant neuroendocrine tumor to liver Repeat imaging study showed slight disease progression but overall the patient is not symptomatic. He has normal liver function tests. I reviewed with him and family the current cancer guidelines. Due to lack of symptoms, he has an option of either: Observation with repeat imaging studies in several months versus octreotide injection. I gave him patient education handout regarding side effects of octreotide injection. The use of octreotide is not for cure and it might delay disease progression. Ultimately, the patient has made an informed decision not to pursue any therapy right now and will come back in 3 months with repeat history, physical examination, blood work and imaging study.  Nausea alone This has resolved. He is not using any antiemetics.  CVA (cerebral infarction) He has no long-term neurological deficit. He's doing well on anticoagulation therapy under the care of his internist. He denies bleeding complications.    Orders Placed This Encounter  Procedures  . CT  Chest W Contrast    Standing Status: Future     Number of Occurrences:      Standing Expiration Date: 12/06/2014    Order Specific Question:  Reason for Exam (SYMPTOM  OR DIAGNOSIS REQUIRED)    Answer:  staging metastatic neuroendocrine tumor    Order Specific Question:  Preferred imaging location?    Answer:  [facility]  . CT Abdomen Pelvis W Contrast    Standing Status: Future     Number of Occurrences:      Standing Expiration Date: 01/06/2015    Order Specific Question:  Reason for Exam (SYMPTOM  OR DIAGNOSIS REQUIRED)    Answer:  staging metastatic neuroendocrine tumor    Order Specific Question:  Preferred imaging location?    Answer:  [facility]   All questions were answered. The patient knows to call the clinic with any problems, questions or concerns. No barriers to learning was detected. I spent 25 minutes counseling the patient face to face. The total time spent in the appointment was 30 minutes and more than 50% was on counseling and review of test results     [facility], Chirsty Armistead, MD 10/06/2013 9:12 AM

## 2013-10-07 ENCOUNTER — Telehealth: Payer: Self-pay | Admitting: *Deleted

## 2013-10-07 NOTE — Telephone Encounter (Signed)
Daughter asks to speak with Dr. Alvy Bimler "one on one" regarding pt's treatment.  Asked Daughter if I can ask Dr. Alvy Bimler for her or give her any further message.  Daughter states did not want to give her a message but really wants to speak with her directly if possible.  Her phone [phone].

## 2013-10-08 ENCOUNTER — Telehealth: Payer: Self-pay | Admitting: Hematology and Oncology

## 2013-10-08 NOTE — Telephone Encounter (Signed)
I spoke with his daughter [name] and reassure her again that giving him a treatment break is the right thing to do.

## 2013-10-11 ENCOUNTER — Ambulatory Visit: Payer: Medicare Other

## 2013-10-13 ENCOUNTER — Ambulatory Visit (INDEPENDENT_AMBULATORY_CARE_PROVIDER_SITE_OTHER): Payer: Medicare Other | Admitting: Pharmacist

## 2013-10-13 DIAGNOSIS — Z7901 Long term (current) use of anticoagulants: Secondary | ICD-10-CM

## 2013-10-13 DIAGNOSIS — I639 Cerebral infarction, unspecified: Secondary | ICD-10-CM

## 2013-10-13 DIAGNOSIS — I6350 Cerebral infarction due to unspecified occlusion or stenosis of unspecified cerebral artery: Secondary | ICD-10-CM

## 2013-10-13 LAB — POCT INR: INR: 2

## 2013-10-13 NOTE — Progress Notes (Signed)
Anti-Coagulation Progress Note  Jonathan Reed is a 68 y.o. male who is currently on an anti-coagulation regimen.    RECENT RESULTS: Recent results are below, the most recent result is correlated with a dose of 30 mg. per week: Lab Results  Component Value Date   INR 2.0 10/13/2013   INR 3.00 09/20/2013   INR 2.1 08/23/2013   PROTIME 30.0* 05/15/2012    ANTI-COAG DOSE: Anticoagulation Dose Instructions as of 10/13/2013     [name] Tue Wed Thu Fri Sat   New Dose 5 mg 2.5 mg 5 mg 5 mg 5 mg 5 mg 5 mg       ANTICOAG SUMMARY: Anticoagulation Episode Summary   Current INR goal 2.0-3.0  Next INR check 11/08/2013  INR from last check 2.0 (10/13/2013)  Weekly max dose   Target end date Indefinite  [facility]  Preferred lab   Send INR reminders to    Indications  CVA (cerebral infarction) [434.91] Long term (current) use of anticoagulants [V58.61]        Comments         ANTICOAG TODAY: Anticoagulation Summary as of 10/13/2013   INR goal 2.0-3.0  Selected INR 2.0 (10/13/2013)  Next INR check 11/08/2013  Target end date Indefinite   Indications  CVA (cerebral infarction) [434.91] Long term (current) use of anticoagulants [V58.61]      Anticoagulation Episode Summary   [facility]   Preferred lab    Send INR reminders to    Comments       PATIENT INSTRUCTIONS: Patient Instructions  Patient instructed to take medications as defined in the Anti-coagulation Track section of this encounter.  Patient instructed to take today's dose.  Patient verbalized understanding of these instructions.       FOLLOW-UP Return in 4 weeks (on 11/08/2013) for Follow up INR at 0845h.  [name], III Pharm.D., CACP

## 2013-10-13 NOTE — Patient Instructions (Signed)
Patient instructed to take medications as defined in the Anti-coagulation Track section of this encounter.  Patient instructed to take today's dose.  Patient verbalized understanding of these instructions.    

## 2013-10-13 NOTE — Progress Notes (Signed)
INTERNAL MEDICINE TEACHING ATTENDING ADDENDUM - Aldine Contes M.D  Duration- indefinite, Indication- CVA, INR- therapeutic. Agree with Dr. Gladstone Pih recommendations as outlined in his note.

## 2013-11-06 ENCOUNTER — Ambulatory Visit (INDEPENDENT_AMBULATORY_CARE_PROVIDER_SITE_OTHER): Payer: Medicare Other | Admitting: Emergency Medicine

## 2013-11-06 ENCOUNTER — Ambulatory Visit (INDEPENDENT_AMBULATORY_CARE_PROVIDER_SITE_OTHER): Payer: Medicare Other

## 2013-11-06 VITALS — BP 132/88 | HR 58 | Temp 98.8°F | Resp 16 | Ht 69.0 in | Wt 206.0 lb

## 2013-11-06 DIAGNOSIS — J208 Acute bronchitis due to other specified organisms: Secondary | ICD-10-CM

## 2013-11-06 DIAGNOSIS — R0602 Shortness of breath: Secondary | ICD-10-CM

## 2013-11-06 DIAGNOSIS — R05 Cough: Secondary | ICD-10-CM

## 2013-11-06 DIAGNOSIS — I2589 Other forms of chronic ischemic heart disease: Secondary | ICD-10-CM

## 2013-11-06 DIAGNOSIS — R059 Cough, unspecified: Secondary | ICD-10-CM

## 2013-11-06 DIAGNOSIS — J209 Acute bronchitis, unspecified: Secondary | ICD-10-CM

## 2013-11-06 LAB — POCT CBC
Granulocyte percent: 71.2 %G (ref 37–80)
HCT, POC: 47.3 % (ref 43.5–53.7)
Hemoglobin: 15.6 g/dL (ref 14.1–18.1)
LYMPH, POC: 1.2 (ref 0.6–3.4)
MCH: 32 pg — AB (ref 27–31.2)
MCHC: 33.1 g/dL (ref 31.8–35.4)
MCV: 96.9 fL (ref 80–97)
MID (CBC): 0.5 (ref 0–0.9)
MPV: 7.7 fL (ref 0–99.8)
PLATELET COUNT, POC: 206 10*3/uL (ref 142–424)
POC Granulocyte: 4.2 (ref 2–6.9)
POC LYMPH %: 19.6 % (ref 10–50)
POC MID %: 9.2 %M (ref 0–12)
RBC: 4.88 M/uL (ref 4.69–6.13)
RDW, POC: 14.3 %
WBC: 5.9 10*3/uL (ref 4.6–10.2)

## 2013-11-06 LAB — BRAIN NATRIURETIC PEPTIDE: Brain Natriuretic Peptide: 37.5 pg/mL (ref 0.0–100.0)

## 2013-11-06 MED ORDER — CEFDINIR 300 MG PO CAPS: 600.0000 mg | ORAL_CAPSULE | Freq: Every day | ORAL | Status: DC

## 2013-11-06 NOTE — Patient Instructions (Signed)

## 2013-11-06 NOTE — Progress Notes (Signed)
Subjective:  This chart was scribed for Arlyss Queen, MD by Donato Schultz, Medical Scribe. This patient was seen in Room 10 and the patient's care was started at 2:00 PM.   Patient ID: Jonathan Reed, male    DOB: 1945/09/23, 68 y.o.   MRN: [id number]  HPI HPI Comments: Jonathan Reed is a 68 y.o. male who presents to the [facility] complaining of constant back pain with associated productive cough.  He denies head congestion and sinus pressure as associated symptoms.  He lists wheezing as an associated symptom.  He has experienced these symptoms in January 2014 and was diagnosed with pneumonia.  He does not have a history of smoking.  He has a history of pacemaker placement and his cardiologist is Dr. Boyd Kerbs who is located in [place].    He has a history of metastatic carcinoid tumor.  History of community acquire pneumonia in 2014 with known chronic kidney disease.  History of stroke and on anticoagulation.  Past Medical History  Diagnosis Date  . CHF (congestive heart failure)     with ICD (f/u with [facility]  . CVA (cerebral infarction) 2011    No residual Sx  . CAD (coronary artery disease) 2000  . HTN (hypertension)   . Nausea alone 01/06/2013  . Abdominal pain, unspecified site 01/06/2013  . Carcinoid syndrome   . Metastatic malignant neuroendocrine tumor to liver   . Metastasis to liver 10/06/2013  . Stroke    Past Surgical History  Procedure Laterality Date  . Cardiac defibrillator placement    . Inguinal hernia repair Bilateral   . Sinus surgery with instatrak    . Colonoscopy  1980    colon polyps, no f/u since.    Family History  Problem Relation Age of Onset  . Stroke Mother   . Diabetes Father     diabetic coma  . Diabetes Sister   . Cancer Sister     intestinal ca?  . Prostate cancer Brother     prostate  . Breast cancer Maternal Aunt   . Clotting disorder Brother   . Heart disease Brother   . Kidney disease Brother   . Stroke Sister   .  Hypertension Daughter    History   Social History  . Marital Status: Married    Spouse Name: N/A    Number of Children: 2  . Years of Education: N/A   Occupational History  .      refurnishing furniture   Social History Main Topics  . Smoking status: Never Smoker   . Smokeless tobacco: Never Used  . Alcohol Use: No  . Drug Use: No  . Sexual Activity: Not Currently   Other Topics Concern  . Not on file   Social History Narrative  . No narrative on file   No Known Allergies  Review of Systems  HENT: Negative for congestion and sinus pressure.   Respiratory: Positive for cough and wheezing.      Objective:  Physical Exam  Nursing note and vitals reviewed. Constitutional: He is oriented to person, place, and time. He appears well-developed and well-nourished.  HENT:  Head: Normocephalic and atraumatic.  Right Ear: External ear normal.  Left Ear: External ear normal.  Nose: Nose normal.  Mouth/Throat: Oropharynx is clear and moist. No oropharyngeal exudate.  Nasal congestion.  Eyes: Conjunctivae and EOM are normal. Pupils are equal, round, and reactive to light.  Neck: Normal range of motion.  Cardiovascular:  Normal rate, regular rhythm and normal heart sounds.  Exam reveals no gallop and no friction rub.   No murmur heard. Implanted defibrillator.   Pulmonary/Chest: Effort normal. No respiratory distress. He has no wheezes. He has rhonchi. He has no rales.  Rhonchi in both bases without rales.  Musculoskeletal: Normal range of motion.  Neurological: He is alert and oriented to person, place, and time.  Skin: Skin is warm and dry.  Psychiatric: He has a normal mood and affect. His behavior is normal.   [facility] preliminary x-ray report read by Dr. Everlene Farrier: There is cardiomegaly without pneumonic infiltrates.  His ICD is in the left upper chest.  Results for orders placed in visit on 11/06/13  POCT CBC      Result Value Ref Range   WBC 5.9  4.6 - 10.2 K/uL   Lymph,  poc 1.2  0.6 - 3.4   POC LYMPH PERCENT 19.6  10 - 50 %L   MID (cbc) 0.5  0 - 0.9   POC MID % 9.2  0 - 12 %M   POC Granulocyte 4.2  2 - 6.9   Granulocyte percent 71.2  37 - 80 %G   RBC 4.88  4.69 - 6.13 M/uL   Hemoglobin 15.6  14.1 - 18.1 g/dL   HCT, POC 47.3  43.5 - 53.7 %   MCV 96.9  80 - 97 fL   MCH, POC 32.0 (*) 27 - 31.2 pg   MCHC 33.1  31.8 - 35.4 g/dL   RDW, POC 14.3     Platelet Count, POC 206  142 - 424 K/uL   MPV 7.7  0 - 99.8 fL    BP 132/88  Pulse 58  Temp(Src) 98.8 F (37.1 C) (Oral)  Resp 16  Ht 5\' 9"  (1.753 m)  Wt 206 lb (93.441 kg)  BMI 30.41 kg/m2  SpO2 99% Assessment & Plan:  Patient placed on Omnicef to cover for bronchitis.  He has a history of community acquired pneumonia and is high risk.he will notify the Coumadin clinic on Monday and let them know he is on antibiotics . He will take Mucinex for cough.   I personally performed the services described in this documentation, which was scribed in my presence. The recorded information has been reviewed and is accurate.

## 2013-11-08 ENCOUNTER — Ambulatory Visit (INDEPENDENT_AMBULATORY_CARE_PROVIDER_SITE_OTHER): Payer: Medicare Other | Admitting: Pharmacist

## 2013-11-08 DIAGNOSIS — Z7901 Long term (current) use of anticoagulants: Secondary | ICD-10-CM

## 2013-11-08 DIAGNOSIS — I6350 Cerebral infarction due to unspecified occlusion or stenosis of unspecified cerebral artery: Secondary | ICD-10-CM

## 2013-11-08 DIAGNOSIS — I639 Cerebral infarction, unspecified: Secondary | ICD-10-CM

## 2013-11-09 LAB — POCT INR: INR: 2.2

## 2013-11-09 NOTE — Patient Instructions (Signed)
Patient instructed to take medications as defined in the Anti-coagulation Track section of this encounter.  Patient instructed to take today's dose.  Patient verbalized understanding of these instructions.    

## 2013-11-09 NOTE — Progress Notes (Signed)
Anti-Coagulation Progress Note  Jonathan Reed is a 68 y.o. male who is currently on an anti-coagulation regimen.    RECENT RESULTS: Recent results are below, the most recent result is correlated with a dose of 32.5 mg. per week: Lab Results  Component Value Date   INR 2.20 11/08/2013   INR 2.0 10/13/2013   INR 3.00 09/20/2013   PROTIME 30.0* 05/15/2012    ANTI-COAG DOSE: Anticoagulation Dose Instructions as of 11/08/2013     [name] Tue Wed Thu Fri Sat   New Dose 5 mg 5 mg 5 mg 5 mg 5 mg 5 mg 5 mg       ANTICOAG SUMMARY: Anticoagulation Episode Summary   Current INR goal 2.0-3.0  Next INR check 12/06/2013  INR from last check 2.20 (11/08/2013)  Most recent INR 2.20 (11/09/2013)  Weekly max dose   Target end date Indefinite  [facility]  Preferred lab   Send INR reminders to    Indications  CVA (cerebral infarction) [434.91] Long term (current) use of anticoagulants [V58.61]        Comments         ANTICOAG TODAY: Anticoagulation Summary as of 11/08/2013   INR goal 2.0-3.0  Selected INR 2.20 (11/08/2013)  Next INR check 12/06/2013  Target end date Indefinite   Indications  CVA (cerebral infarction) [434.91] Long term (current) use of anticoagulants [V58.61]      Anticoagulation Episode Summary   [facility]   Preferred lab    Send INR reminders to    Comments       PATIENT INSTRUCTIONS: Patient Instructions  Patient instructed to take medications as defined in the Anti-coagulation Track section of this encounter.  Patient instructed to take today's dose.  Patient verbalized understanding of these instructions.       FOLLOW-UP Return in 4 weeks (on 12/06/2013) for Follow up INR at 0915.  [name], III Pharm.D., CACP

## 2013-12-06 ENCOUNTER — Encounter: Payer: Self-pay | Admitting: Internal Medicine

## 2013-12-06 ENCOUNTER — Ambulatory Visit (INDEPENDENT_AMBULATORY_CARE_PROVIDER_SITE_OTHER): Payer: Medicare Other | Admitting: Internal Medicine

## 2013-12-06 ENCOUNTER — Ambulatory Visit (INDEPENDENT_AMBULATORY_CARE_PROVIDER_SITE_OTHER): Payer: Medicare Other | Admitting: Pharmacist

## 2013-12-06 VITALS — BP 131/79 | HR 55 | Temp 97.5°F | Ht 70.0 in | Wt 206.0 lb

## 2013-12-06 DIAGNOSIS — Z7901 Long term (current) use of anticoagulants: Secondary | ICD-10-CM

## 2013-12-06 DIAGNOSIS — I10 Essential (primary) hypertension: Secondary | ICD-10-CM

## 2013-12-06 DIAGNOSIS — C787 Secondary malignant neoplasm of liver and intrahepatic bile duct: Secondary | ICD-10-CM

## 2013-12-06 DIAGNOSIS — Z23 Encounter for immunization: Secondary | ICD-10-CM

## 2013-12-06 DIAGNOSIS — C7B8 Other secondary neuroendocrine tumors: Secondary | ICD-10-CM

## 2013-12-06 DIAGNOSIS — I6350 Cerebral infarction due to unspecified occlusion or stenosis of unspecified cerebral artery: Secondary | ICD-10-CM

## 2013-12-06 LAB — POCT INR: INR: 3.1

## 2013-12-06 MED ORDER — OXYCODONE HCL 5 MG PO TABS: 5.0000 mg | ORAL_TABLET | Freq: Four times a day (QID) | ORAL | Status: DC | PRN

## 2013-12-06 MED ORDER — VITAMIN C 500 MG/5ML PO LIQD: 1000.0000 mg | Freq: Every morning | ORAL | Status: DC

## 2013-12-06 NOTE — Assessment & Plan Note (Signed)
BP Readings from Last 3 Encounters:  12/06/13 131/79  11/06/13 132/88  10/06/13 141/88    Lab Results  Component Value Date   NA 141 10/01/2013   K 3.9 10/01/2013   CREATININE 1.2 10/01/2013    Assessment: Blood pressure control:  controlled  Progress toward BP goal:   at goal Comments: He is on lisinopril-HCTZ 10-25mg  daily, Toprol 50mg  daily, spironolactone 25mg  daily with good BP control.  Plan: Medications:  continue current medications Educational resources provided: brochure Self management tools provided: home blood pressure logbook Other plans: Follow up in 3 months.

## 2013-12-06 NOTE — Progress Notes (Signed)
Anti-Coagulation Progress Note  Jonathan Reed is a 68 y.o. male who is currently on an anti-coagulation regimen.    RECENT RESULTS: Recent results are below, the most recent result is correlated with a dose of 35 mg. per week: Lab Results  Component Value Date   INR 3.10 12/06/2013   INR 2.20 11/08/2013   INR 2.0 10/13/2013   PROTIME 30.0* 05/15/2012    ANTI-COAG DOSE: Anticoagulation Dose Instructions as of 12/06/2013     [name] Tue Wed Thu Fri Sat   New Dose 5 mg 2.5 mg 5 mg 5 mg 5 mg 5 mg 5 mg       ANTICOAG SUMMARY: Anticoagulation Episode Summary   Current INR goal 2.0-3.0  Next INR check 01/03/2014  INR from last check 3.10! (12/06/2013)  Weekly max dose   Target end date Indefinite  [facility]  Preferred lab   Send INR reminders to    Indications  CVA (cerebral infarction) [434.91] Long term (current) use of anticoagulants [V58.61]        Comments         ANTICOAG TODAY: Anticoagulation Summary as of 12/06/2013   INR goal 2.0-3.0  Selected INR 3.10! (12/06/2013)  Next INR check 01/03/2014  Target end date Indefinite   Indications  CVA (cerebral infarction) [434.91] Long term (current) use of anticoagulants [V58.61]      Anticoagulation Episode Summary   [facility]   Preferred lab    Send INR reminders to    Comments       PATIENT INSTRUCTIONS: Patient Instructions  Patient instructed to take medications as defined in the Anti-coagulation Track section of this encounter.  Patient instructed to take today's dose.  Patient verbalized understanding of these instructions.       FOLLOW-UP Return in about 4 weeks (around 01/03/2014) for Follow up INR at 0930h.  [name], III Pharm.D., CACP

## 2013-12-06 NOTE — Patient Instructions (Signed)
Patient instructed to take medications as defined in the Anti-coagulation Track section of this encounter.  Patient instructed to take today's dose.  Patient verbalized understanding of these instructions.    

## 2013-12-06 NOTE — Assessment & Plan Note (Addendum)
Per last visit with Dr. Alvy Bimler, there has been slight progression of the disease but the patient feels well overall.  He is currently not on treatment but will have repeat imagine and f/u appt with Oncology next month.  Pt and family met with Golden Hurter for info about advance directives, HPOA.  Refilled Oxycodone 64m q6h PRN pain, #60

## 2013-12-06 NOTE — Assessment & Plan Note (Signed)
He received the pneumovax 23 during this visit.  He declined the flu vaccine but will get it during his next visit.

## 2013-12-06 NOTE — Progress Notes (Signed)
   Subjective:    Patient ID: Jonathan Reed, male    DOB: 10-14-1945, 68 y.o.   MRN: [id number]  HPI [name] is a 68 yr old man with PMH of A. Fib on coumadin, HTN, metastatic malignant neuroendocrine tumor of the liver who presents accompanied by his wife and two daughter for follow up visit.  He has seen Dr. Alvy Bimler in July and is currently not undergoing treatment for his neuroendocrine tumor but will have repeat imaging and follow up visit next month.  He denies abdominal pain, diarrhea, feeling flushed, or weight loss/decreased appetite.  He needs refill for his pain medication Oxycodone. He take one tablet 1 per week or less if he has abdominal pain.     Review of Systems  Constitutional: Negative for fever, chills, diaphoresis, activity change, appetite change, fatigue and unexpected weight change.  Respiratory: Negative for cough, shortness of breath and wheezing.   Cardiovascular: Negative for chest pain, palpitations and leg swelling.  Gastrointestinal: Negative for nausea, vomiting, abdominal pain, diarrhea, constipation and blood in stool.  Genitourinary: Negative for dysuria and difficulty urinating.  Musculoskeletal: Negative for back pain.  Neurological: Negative for dizziness, weakness and light-headedness.  Psychiatric/Behavioral: Negative for agitation.       Objective:   Physical Exam  Nursing note and vitals reviewed. Constitutional: He is oriented to person, place, and time. He appears well-developed and well-nourished. No distress.  Cardiovascular: Normal rate and regular rhythm.   Pulmonary/Chest: Effort normal and breath sounds normal. No respiratory distress. He has no wheezes.  Abdominal: Soft. Bowel sounds are normal. He exhibits no distension. There is no tenderness.  Musculoskeletal: He exhibits no edema and no tenderness.  Neurological: He is alert and oriented to person, place, and time. Coordination normal.  Skin: Skin is warm and dry. No rash noted.  He is not diaphoretic. No erythema.  Psychiatric: He has a normal mood and affect. His behavior is normal.          Assessment & Plan:

## 2013-12-06 NOTE — Progress Notes (Signed)
INTERNAL MEDICINE TEACHING ATTENDING ADDENDUM - Aldine Contes M.D  Duration- indefinte, Indication- afib, INR- supratherapeutic. Agree with Dr. Gladstone Pih recommendations as outlined in his note.

## 2013-12-06 NOTE — Patient Instructions (Signed)
-  Your blood pressure looks great today! -Follow with Dr. Alvy Bimler next month.   -Follow up with [place] in 3 months.   -It was great seeing you again today!  Please bring your medicines with you each time you come.   Medicines may be  Eye drops  Herbal   Vitamins  Pills  Seeing these help [place] take care of you.

## 2013-12-07 NOTE — Progress Notes (Signed)
INTERNAL MEDICINE TEACHING ATTENDING ADDENDUM - Reinaldo Helt, MD: I reviewed and discussed at the time of visit with the resident Dr. Kennerly, the patient's medical history, physical examination, diagnosis and results of pertinent tests and treatment and I agree with the patient's care as documented.  

## 2013-12-13 ENCOUNTER — Encounter: Payer: Self-pay | Admitting: Hematology and Oncology

## 2013-12-13 NOTE — Progress Notes (Signed)
Put daughter's fmla form on nurse's desk. °

## 2013-12-14 ENCOUNTER — Encounter: Payer: Self-pay | Admitting: Hematology and Oncology

## 2013-12-14 NOTE — Progress Notes (Signed)
Faxed daughter's fmla form to [phone]

## 2013-12-31 ENCOUNTER — Telehealth: Payer: Self-pay | Admitting: Nurse Practitioner

## 2013-12-31 NOTE — Telephone Encounter (Signed)
Patient's daughter, Jonathan Reed, asking whether Jonathan Reed should stop BP meds before CT scan? Per Dr. Alvy Bimler, hold BP med day before, day of scan, and day after scan. Tammy verbalizes understanding.

## 2014-01-03 ENCOUNTER — Other Ambulatory Visit (HOSPITAL_BASED_OUTPATIENT_CLINIC_OR_DEPARTMENT_OTHER): Payer: Medicare Other

## 2014-01-03 ENCOUNTER — Ambulatory Visit (INDEPENDENT_AMBULATORY_CARE_PROVIDER_SITE_OTHER): Payer: Medicare Other | Admitting: Pharmacist

## 2014-01-03 ENCOUNTER — Ambulatory Visit (HOSPITAL_COMMUNITY)
Admission: RE | Admit: 2014-01-03 | Discharge: 2014-01-03 | Disposition: A | Discharge status: Home / Self Care | Payer: Medicare Other | Source: Ambulatory Visit | Attending: Hematology and Oncology | Admitting: Hematology and Oncology

## 2014-01-03 ENCOUNTER — Encounter (HOSPITAL_COMMUNITY): Payer: Self-pay

## 2014-01-03 DIAGNOSIS — R109 Unspecified abdominal pain: Secondary | ICD-10-CM

## 2014-01-03 DIAGNOSIS — C7A00 Malignant carcinoid tumor of unspecified site: Secondary | ICD-10-CM

## 2014-01-03 DIAGNOSIS — C7B8 Other secondary neuroendocrine tumors: Secondary | ICD-10-CM

## 2014-01-03 DIAGNOSIS — C801 Malignant (primary) neoplasm, unspecified: Secondary | ICD-10-CM | POA: Insufficient documentation

## 2014-01-03 DIAGNOSIS — D3A00 Benign carcinoid tumor of unspecified site: Secondary | ICD-10-CM

## 2014-01-03 DIAGNOSIS — I63039 Cerebral infarction due to thrombosis of unspecified carotid artery: Secondary | ICD-10-CM

## 2014-01-03 DIAGNOSIS — C787 Secondary malignant neoplasm of liver and intrahepatic bile duct: Secondary | ICD-10-CM | POA: Diagnosis not present

## 2014-01-03 DIAGNOSIS — R110 Nausea: Secondary | ICD-10-CM

## 2014-01-03 DIAGNOSIS — Z7901 Long term (current) use of anticoagulants: Secondary | ICD-10-CM

## 2014-01-03 LAB — COMPREHENSIVE METABOLIC PANEL (CC13)
ALT: 26 U/L (ref 0–55)
ANION GAP: 9 meq/L (ref 3–11)
AST: 17 U/L (ref 5–34)
Albumin: 3.7 g/dL (ref 3.5–5.0)
Alkaline Phosphatase: 75 U/L (ref 40–150)
BUN: 17.6 mg/dL (ref 7.0–26.0)
CALCIUM: 9.8 mg/dL (ref 8.4–10.4)
CHLORIDE: 107 meq/L (ref 98–109)
CO2: 25 meq/L (ref 22–29)
CREATININE: 1.3 mg/dL (ref 0.7–1.3)
Glucose: 99 mg/dl (ref 70–140)
Potassium: 4.2 mEq/L (ref 3.5–5.1)
Sodium: 142 mEq/L (ref 136–145)
Total Bilirubin: 0.46 mg/dL (ref 0.20–1.20)
Total Protein: 7.3 g/dL (ref 6.4–8.3)

## 2014-01-03 LAB — CBC WITH DIFFERENTIAL/PLATELET
BASO%: 0.5 % (ref 0.0–2.0)
Basophils Absolute: 0 10*3/uL (ref 0.0–0.1)
EOS%: 2.5 % (ref 0.0–7.0)
Eosinophils Absolute: 0.1 10*3/uL (ref 0.0–0.5)
HCT: 44.3 % (ref 38.4–49.9)
HEMOGLOBIN: 14.5 g/dL (ref 13.0–17.1)
LYMPH#: 1.2 10*3/uL (ref 0.9–3.3)
LYMPH%: 25.8 % (ref 14.0–49.0)
MCH: 30.8 pg (ref 27.2–33.4)
MCHC: 32.6 g/dL (ref 32.0–36.0)
MCV: 94.3 fL (ref 79.3–98.0)
MONO#: 0.5 10*3/uL (ref 0.1–0.9)
MONO%: 11.6 % (ref 0.0–14.0)
NEUT#: 2.7 10*3/uL (ref 1.5–6.5)
NEUT%: 59.6 % (ref 39.0–75.0)
PLATELETS: 191 10*3/uL (ref 140–400)
RBC: 4.7 10*6/uL (ref 4.20–5.82)
RDW: 14.2 % (ref 11.0–14.6)
WBC: 4.5 10*3/uL (ref 4.0–10.3)

## 2014-01-03 LAB — POCT INR: INR: 2.2

## 2014-01-03 MED ORDER — IOHEXOL 300 MG/ML  SOLN
100.0000 mL | Freq: Once | INTRAMUSCULAR | Status: AC | PRN
  Administered 2014-01-03: 100 mL via INTRAVENOUS

## 2014-01-03 NOTE — Progress Notes (Signed)
Indication: Atrial fibrillation. Duration: Lifelong. INR: At target. Agree with Dr. Gladstone Pih assessment and plan.

## 2014-01-03 NOTE — Progress Notes (Signed)
Anti-Coagulation Progress Note  Jonathan Reed is a 68 y.o. male who is currently on an anti-coagulation regimen.    RECENT RESULTS: Recent results are below, the most recent result is correlated with a dose of 32.5 mg. per week: Lab Results  Component Value Date   INR 2.20 01/03/2014   INR 3.10 12/06/2013   INR 2.20 11/08/2013   PROTIME 30.0* 05/15/2012    ANTI-COAG DOSE: Anticoagulation Dose Instructions as of 01/03/2014     [name] Tue Wed Thu Fri Sat   New Dose 5 mg 2.5 mg 5 mg 5 mg 5 mg 5 mg 5 mg       ANTICOAG SUMMARY: Anticoagulation Episode Summary   Current INR goal 2.0-3.0  Next INR check 01/31/2014  INR from last check 2.20 (01/03/2014)  Weekly max dose   Target end date Indefinite  [facility]  Preferred lab   Send INR reminders to    Indications  CVA (cerebral infarction) [I63.9] Long term (current) use of anticoagulants [Z79.01]        Comments         ANTICOAG TODAY: Anticoagulation Summary as of 01/03/2014   INR goal 2.0-3.0  Selected INR 2.20 (01/03/2014)  Next INR check 01/31/2014  Target end date Indefinite   Indications  CVA (cerebral infarction) [I63.9] Long term (current) use of anticoagulants [Z79.01]      Anticoagulation Episode Summary   [facility]   Preferred lab    Send INR reminders to    Comments       PATIENT INSTRUCTIONS: Patient Instructions  Patient instructed to take medications as defined in the Anti-coagulation Track section of this encounter.  Patient instructed to take today's dose.  Patient verbalized understanding of these instructions.       FOLLOW-UP Return in 4 weeks (on 01/31/2014) for Follow up INR at 1030h.  [name], III Pharm.D., CACP

## 2014-01-03 NOTE — Patient Instructions (Signed)
Patient instructed to take medications as defined in the Anti-coagulation Track section of this encounter.  Patient instructed to take today's dose.  Patient verbalized understanding of these instructions.    

## 2014-01-04 ENCOUNTER — Ambulatory Visit (HOSPITAL_BASED_OUTPATIENT_CLINIC_OR_DEPARTMENT_OTHER): Payer: Medicare Other | Admitting: Hematology and Oncology

## 2014-01-04 ENCOUNTER — Telehealth: Payer: Self-pay | Admitting: Hematology and Oncology

## 2014-01-04 ENCOUNTER — Encounter: Payer: Self-pay | Admitting: Hematology and Oncology

## 2014-01-04 VITALS — BP 144/82 | HR 55 | Temp 97.9°F | Resp 18 | Ht 70.0 in | Wt 209.6 lb

## 2014-01-04 DIAGNOSIS — I2510 Atherosclerotic heart disease of native coronary artery without angina pectoris: Secondary | ICD-10-CM

## 2014-01-04 DIAGNOSIS — K0889 Other specified disorders of teeth and supporting structures: Secondary | ICD-10-CM | POA: Insufficient documentation

## 2014-01-04 DIAGNOSIS — I482 Chronic atrial fibrillation, unspecified: Secondary | ICD-10-CM

## 2014-01-04 DIAGNOSIS — I639 Cerebral infarction, unspecified: Secondary | ICD-10-CM

## 2014-01-04 DIAGNOSIS — K088 Other specified disorders of teeth and supporting structures: Secondary | ICD-10-CM

## 2014-01-04 DIAGNOSIS — C7B8 Other secondary neuroendocrine tumors: Secondary | ICD-10-CM

## 2014-01-04 DIAGNOSIS — I2584 Coronary atherosclerosis due to calcified coronary lesion: Secondary | ICD-10-CM

## 2014-01-04 DIAGNOSIS — C787 Secondary malignant neoplasm of liver and intrahepatic bile duct: Secondary | ICD-10-CM

## 2014-01-04 HISTORY — DX: Other specified disorders of teeth and supporting structures: K08.89

## 2014-01-04 MED ORDER — OXYCODONE HCL 5 MG PO TABS: 5.0000 mg | ORAL_TABLET | Freq: Four times a day (QID) | ORAL | Status: DC | PRN

## 2014-01-04 NOTE — Telephone Encounter (Signed)
Pt confirmed labs/ov per 10/20 POF, gave pt AVS.... KJ °

## 2014-01-04 NOTE — Progress Notes (Signed)
[facility] OFFICE PROGRESS NOTE  Patient Care Team: Blain Pais, MD as PCP - General (Internal Medicine) Estill Dooms, MD as Referring Physician (Surgery) Heath Lark, MD as Consulting Physician (Hematology and Oncology)  SUMMARY OF ONCOLOGIC HISTORY:  In January of 2014 the patient was hospitalized for shortness of breath. He presented also with some epigastric pain and imaging studies review abnormal liver mass.  On 04/15/2012 he underwent biopsy which showed well differentiated neuroendocrine tumor.  Since February 2014, he was placed on palliative chemotherapy with Xeloda 1000 mg per meter squared twice a day for 2 weeks, off 2 weeks and Temodar 200 mg per meter squared daily on days 10-14, each cycle is a 28 day cycle.  May 2014 CT scan show partial response.  September 2014 repeat imaging study show continued response to treatment. January 2015, repeat imaging study showed mixed response In April 2015, repeat imaging study shows stable disease. After a lot of discussion, the patient is in agreement to stop treatment for chemotherapy holiday In July 2015, repeat imaging studies show mild disease progression. The patient elected for observation. It October 2015, repeat imaging studies showed stable disease. He is observed. INTERVAL HISTORY: Please see below for problem oriented charting. He is doing well. Appetite is stable. He complained of mild tooth ache recently. He remained on anticoagulation therapy for chronic atrial fibrillation. Denies recent strokelike symptoms. The patient denies any recent signs or symptoms of bleeding such as spontaneous epistaxis, hematuria or hematochezia.   REVIEW OF SYSTEMS:   Constitutional: Denies fevers, chills or abnormal weight loss Eyes: Denies blurriness of vision Ears, nose, mouth, throat, and face: Denies mucositis or sore throat Respiratory: Denies cough, dyspnea or wheezes Cardiovascular: Denies palpitation, chest  discomfort or lower extremity swelling Gastrointestinal:  Denies nausea, heartburn or change in bowel habits Skin: Denies abnormal skin rashes Lymphatics: Denies new lymphadenopathy or easy bruising Neurological:Denies numbness, tingling or new weaknesses Behavioral/Psych: Mood is stable, no new changes  All other systems were reviewed with the patient and are negative.  I have reviewed the past medical history, past surgical history, social history and family history with the patient and they are unchanged from previous note.  ALLERGIES:  has No Known Allergies.  MEDICATIONS:  Current Outpatient Prescriptions  Medication Sig Dispense Refill  . Ascorbic Acid (VITAMIN C) 500 MG/5ML LIQD Take 10 mLs (1,000 mg total) by mouth every morning.  236 mL  0  . lisinopril-hydrochlorothiazide (PRINZIDE,ZESTORETIC) 20-25 MG per tablet Take 1 tablet by mouth daily.  90 tablet  2  . metoprolol succinate (TOPROL-XL) 50 MG 24 hr tablet Take 1 tablet (50 mg total) by mouth daily. Take with or immediately following a meal.  30 tablet  2  . oxyCODONE (ROXICODONE) 5 MG immediate release tablet Take 1 tablet (5 mg total) by mouth every 6 (six) hours as needed for moderate pain.  30 tablet  0  . spironolactone (ALDACTONE) 25 MG tablet Take 25 mg by mouth daily.      Marland Kitchen sulfamethoxazole-trimethoprim (BACTRIM DS) 800-160 MG per tablet Take by mouth.      . warfarin (COUMADIN) 5 MG tablet Take 1/2 tablet on Wednesdays; Take 1 tablet all other days! **Patient requests 90 day supply**  90 tablet  3   No current facility-administered medications for this visit.    PHYSICAL EXAMINATION: ECOG PERFORMANCE STATUS: 0 - Asymptomatic  Filed Vitals:   01/04/14 0837  BP: 144/82  Pulse: 55  Temp: 97.9 F (36.6 C)  Resp: 18   Filed Weights   01/04/14 0837  Weight: 209 lb 9.6 oz (95.074 kg)    GENERAL:alert, no distress and comfortable SKIN: skin color, texture, turgor are normal, no rashes or significant  lesions EYES: normal, Conjunctiva are pink and non-injected, sclera clear OROPHARYNX:no exudate, no erythema and lips, buccal mucosa, and tongue normal . Poor dentition is noted NECK: supple, thyroid normal size, non-tender, without nodularity LYMPH:  no palpable lymphadenopathy in the cervical, axillary or inguinal LUNGS: clear to auscultation and percussion with normal breathing effort HEART: regular rate & rhythm and no murmurs and no lower extremity edema ABDOMEN:abdomen soft, non-tender and normal bowel sounds Musculoskeletal:no cyanosis of digits and no clubbing  NEURO: alert & oriented x 3 with fluent speech, no focal motor/sensory deficits  LABORATORY DATA:  I have reviewed the data as listed    Component Value Date/Time   NA 142 01/03/2014 0825   NA 138 06/28/2013 1014   K 4.2 01/03/2014 0825   K 4.1 06/28/2013 1014   CL 103 06/28/2013 1014   CL 105 08/24/2012 0849   CO2 25 01/03/2014 0825   CO2 21 06/28/2013 1014   GLUCOSE 99 01/03/2014 0825   GLUCOSE 86 06/28/2013 1014   GLUCOSE 100* 08/24/2012 0849   BUN 17.6 01/03/2014 0825   BUN 19 06/28/2013 1014   CREATININE 1.3 01/03/2014 0825   CREATININE 1.43* 06/28/2013 1014   CREATININE 1.09 04/16/2012 1016   CALCIUM 9.8 01/03/2014 0825   CALCIUM 10.2 06/28/2013 1014   PROT 7.3 01/03/2014 0825   PROT 7.5 06/28/2013 1014   ALBUMIN 3.7 01/03/2014 0825   ALBUMIN 4.5 06/28/2013 1014   AST 17 01/03/2014 0825   AST 17 06/28/2013 1014   ALT 26 01/03/2014 0825   ALT 15 06/28/2013 1014   ALKPHOS 75 01/03/2014 0825   ALKPHOS 60 06/28/2013 1014   BILITOT 0.46 01/03/2014 0825   BILITOT 0.6 06/28/2013 1014   GFRNONAA 51* 06/28/2013 1014   GFRNONAA 70* 04/16/2012 1016   GFRAA 59* 06/28/2013 1014   GFRAA 81* 04/16/2012 1016    No results found for this basename: SPEP,  UPEP,   kappa and lambda light chains    Lab Results  Component Value Date   WBC 4.5 01/03/2014   NEUTROABS 2.7 01/03/2014   HGB 14.5 01/03/2014   HCT 44.3 01/03/2014   MCV  94.3 01/03/2014   PLT 191 01/03/2014      Chemistry      Component Value Date/Time   NA 142 01/03/2014 0825   NA 138 06/28/2013 1014   K 4.2 01/03/2014 0825   K 4.1 06/28/2013 1014   CL 103 06/28/2013 1014   CL 105 08/24/2012 0849   CO2 25 01/03/2014 0825   CO2 21 06/28/2013 1014   BUN 17.6 01/03/2014 0825   BUN 19 06/28/2013 1014   CREATININE 1.3 01/03/2014 0825   CREATININE 1.43* 06/28/2013 1014   CREATININE 1.09 04/16/2012 1016      Component Value Date/Time   CALCIUM 9.8 01/03/2014 0825   CALCIUM 10.2 06/28/2013 1014   ALKPHOS 75 01/03/2014 0825   ALKPHOS 60 06/28/2013 1014   AST 17 01/03/2014 0825   AST 17 06/28/2013 1014   ALT 26 01/03/2014 0825   ALT 15 06/28/2013 1014   BILITOT 0.46 01/03/2014 0825   BILITOT 0.6 06/28/2013 1014       RADIOGRAPHIC STUDIES: I reviewed the imaging study with him and family I have personally reviewed the radiological images as listed  and agreed with the findings in the report. Ct Chest W Contrast  01/03/2014   CLINICAL DATA:  Subsequent treatment strategy, restaging with neuroendocrine carcinoid tumor metastatic to the liver. Original diagnosis January, 2014.  EXAM: CT CHEST, ABDOMEN, AND PELVIS WITH CONTRAST  TECHNIQUE: Multidetector CT imaging of the chest, abdomen and pelvis was performed following the standard protocol during bolus administration of intravenous contrast.  CONTRAST:  152mL OMNIPAQUE IOHEXOL 300 MG/ML  SOLN  COMPARISON:  Multiple exams, including 10/01/2013 and 04/06/2013  FINDINGS: CT CHEST FINDINGS  Old granulomatous disease with calcified right lower paratracheal lymph node and calcified right hilar lymph nodes. Pacer leads observed. coronary atherosclerosis. Dilated left ventricle with apical thinning. Mild atherosclerotic calcification of the aortic arch.  Calcified granuloma posteriorly in the left upper lobe. No thoracic a osseous metastatic disease identified.  CT ABDOMEN AND PELVIS FINDINGS  Hepatobiliary: The enhancing mass  with central fluid density component in the dome of the right hepatic lobe measures 5.5 by 4.3 cm (formerly 5.7 by 4.5 cm) on image 66 of series 4. Posteriorly in the right hepatic lobe, a hypodense lesion measures 3.7 by 2.3 cm on image 79 of series 4 (formerly the same). In the caudate lobe, a 4.9 by 3.5 cm hypodense lesion on image 91 of series 4 previously measured 4.8 by 3.2 cm. For the most part the liver lesions appear essentially stable. Portal venous structures patent. The caudate lobe lesion may slightly flatten part of the IVC.  Pancreas: Intact  Spleen: Old granulomatous disease  Adrenals/Urinary Tract: Lateral left mid kidney scarring, chronic.  Stomach/Bowel: Small porta hepatis lymph nodes are not pathologically enlarged by size criteria. No cicatricial mass is observed in the mesentery. Prominent stool throughout the colon favors constipation. Focal 1.3 cm lesion in the distal ileum, image 137 of series 4.  Vascular/Lymphatic: Aortoiliac atherosclerotic vascular disease.  Reproductive: Unremarkable  Other: No supplemental non-categorized findings.  Musculoskeletal: Sacralized L5 vertebra. Grade 1 retrolisthesis at L4-5 with disc bulge  IMPRESSION: 1. Overall stable burden of metastatic disease to the liver. Small mass in the distal ileum the shown on prior octreotide localization compatible with distal ileal carcinoid tumor, likewise stable. 2.  Prominent stool throughout the colon favors constipation.   Electronically Signed   By: Sherryl Barters M.D.   On: 01/03/2014 09:53   Ct Abdomen Pelvis W Contrast  01/03/2014   CLINICAL DATA:  Subsequent treatment strategy, restaging with neuroendocrine carcinoid tumor metastatic to the liver. Original diagnosis January, 2014.  EXAM: CT CHEST, ABDOMEN, AND PELVIS WITH CONTRAST  TECHNIQUE: Multidetector CT imaging of the chest, abdomen and pelvis was performed following the standard protocol during bolus administration of intravenous contrast.  CONTRAST:   116mL OMNIPAQUE IOHEXOL 300 MG/ML  SOLN  COMPARISON:  Multiple exams, including 10/01/2013 and 04/06/2013  FINDINGS: CT CHEST FINDINGS  Old granulomatous disease with calcified right lower paratracheal lymph node and calcified right hilar lymph nodes. Pacer leads observed. coronary atherosclerosis. Dilated left ventricle with apical thinning. Mild atherosclerotic calcification of the aortic arch.  Calcified granuloma posteriorly in the left upper lobe. No thoracic a osseous metastatic disease identified.  CT ABDOMEN AND PELVIS FINDINGS  Hepatobiliary: The enhancing mass with central fluid density component in the dome of the right hepatic lobe measures 5.5 by 4.3 cm (formerly 5.7 by 4.5 cm) on image 66 of series 4. Posteriorly in the right hepatic lobe, a hypodense lesion measures 3.7 by 2.3 cm on image 79 of series 4 (formerly the same). In  the caudate lobe, a 4.9 by 3.5 cm hypodense lesion on image 91 of series 4 previously measured 4.8 by 3.2 cm. For the most part the liver lesions appear essentially stable. Portal venous structures patent. The caudate lobe lesion may slightly flatten part of the IVC.  Pancreas: Intact  Spleen: Old granulomatous disease  Adrenals/Urinary Tract: Lateral left mid kidney scarring, chronic.  Stomach/Bowel: Small porta hepatis lymph nodes are not pathologically enlarged by size criteria. No cicatricial mass is observed in the mesentery. Prominent stool throughout the colon favors constipation. Focal 1.3 cm lesion in the distal ileum, image 137 of series 4.  Vascular/Lymphatic: Aortoiliac atherosclerotic vascular disease.  Reproductive: Unremarkable  Other: No supplemental non-categorized findings.  Musculoskeletal: Sacralized L5 vertebra. Grade 1 retrolisthesis at L4-5 with disc bulge  IMPRESSION: 1. Overall stable burden of metastatic disease to the liver. Small mass in the distal ileum the shown on prior octreotide localization compatible with distal ileal carcinoid tumor, likewise  stable. 2.  Prominent stool throughout the colon favors constipation.   Electronically Signed   By: Sherryl Barters M.D.   On: 01/03/2014 09:53     ASSESSMENT & PLAN:  Metastatic malignant neuroendocrine tumor to liver Repeat imaging study showed stable disease and overall the patient is not symptomatic. He has normal liver function tests. I reviewed with him and family the current cancer guidelines. Due to lack of symptoms, he has an option of either: Observation with repeat imaging studies in several months versus octreotide injection. I plan to see him back in 6 months with repeat history, physical examination, blood work and imaging study.    A-fib Hehad history of stroke without long-term neurological deficit. He's doing well on anticoagulation therapy under the care of his internist. He denies bleeding complications.    Tooth ache He has poor dental health. I gave him prescription of oxycodone without refills and recommend he sees his dentist as soon as possible.  CVA (cerebral infarction) He has no permanent long-term neurological deficit. He is on aggressive risk factor modifications  CAD (coronary artery disease) He is on medical management fullness. CT scan showed coronary artery calcification    Orders Placed This Encounter  Procedures  . CT Abdomen Pelvis W Contrast    Standing Status: Future     Number of Occurrences:      Standing Expiration Date: 04/06/2015    Order Specific Question:  Reason for Exam (SYMPTOM  OR DIAGNOSIS REQUIRED)    Answer:  metastatic neuroendocrine tumor to liver, assess disease status    Order Specific Question:  Preferred imaging location?    Answer:  [facility]  . CBC with Differential    Standing Status: Future     Number of Occurrences:      Standing Expiration Date: 04/06/2015  . Comprehensive metabolic panel    Standing Status: Future     Number of Occurrences:      Standing Expiration Date: 04/06/2015   All  questions were answered. The patient knows to call the clinic with any problems, questions or concerns. No barriers to learning was detected. I spent 30 minutes counseling the patient face to face. The total time spent in the appointment was 40 minutes and more than 50% was on counseling and review of test results     [facility], [place], MD 01/04/2014 10:07 PM

## 2014-01-04 NOTE — Assessment & Plan Note (Signed)
He had history of stroke without long-term neurological deficit. He's doing well on anticoagulation therapy under the care of his internist. He denies bleeding complications.   

## 2014-01-04 NOTE — Assessment & Plan Note (Signed)
He has no permanent long-term neurological deficit. He is on aggressive risk factor modifications

## 2014-01-04 NOTE — Assessment & Plan Note (Signed)
He has poor dental health. I gave him prescription of oxycodone without refills and recommend he sees his dentist as soon as possible.

## 2014-01-04 NOTE — Assessment & Plan Note (Signed)
Repeat imaging study showed stable disease and overall the patient is not symptomatic. He has normal liver function tests. I reviewed with him and family the current cancer guidelines. Due to lack of symptoms, he has an option of either: Observation with repeat imaging studies in several months versus octreotide injection. I plan to see him back in 6 months with repeat history, physical examination, blood work and imaging study.

## 2014-01-04 NOTE — Assessment & Plan Note (Signed)
He is on medical management fullness. CT scan showed coronary artery calcification

## 2014-01-31 ENCOUNTER — Ambulatory Visit (INDEPENDENT_AMBULATORY_CARE_PROVIDER_SITE_OTHER): Payer: Medicare Other | Admitting: Pharmacist

## 2014-01-31 DIAGNOSIS — Z8673 Personal history of transient ischemic attack (TIA), and cerebral infarction without residual deficits: Secondary | ICD-10-CM | POA: Diagnosis not present

## 2014-01-31 DIAGNOSIS — Z7901 Long term (current) use of anticoagulants: Secondary | ICD-10-CM

## 2014-01-31 DIAGNOSIS — I63039 Cerebral infarction due to thrombosis of unspecified carotid artery: Secondary | ICD-10-CM

## 2014-01-31 LAB — POCT INR: INR: 2.3

## 2014-01-31 MED ORDER — WARFARIN SODIUM 5 MG PO TABS: ORAL_TABLET | ORAL | Status: DC

## 2014-01-31 NOTE — Patient Instructions (Signed)
Patient instructed to take medications as defined in the Anti-coagulation Track section of this encounter.  Patient instructed to take today's dose.  Patient verbalized understanding of these instructions.    

## 2014-01-31 NOTE — Progress Notes (Signed)
Anti-Coagulation Progress Note  EUGENE ISADORE is a 68 y.o. male who is currently on an anti-coagulation regimen.    RECENT RESULTS: Recent results are below, the most recent result is correlated with a dose of 32.5 mg. per week: Lab Results  Component Value Date   INR 2.30 01/31/2014   INR 2.20 01/03/2014   INR 3.10 12/06/2013   PROTIME 30.0* 05/15/2012    ANTI-COAG DOSE: Anticoagulation Dose Instructions as of 01/31/2014      [name] Tue Wed Thu Fri Sat   New Dose 5 mg 5 mg 5 mg 5 mg 5 mg 5 mg 5 mg       ANTICOAG SUMMARY: Anticoagulation Episode Summary    Current INR goal 2.0-3.0  Next INR check 02/28/2014  INR from last check 2.30 (01/31/2014)  Weekly max dose   Target end date Indefinite  [facility]  Preferred lab   Send INR reminders to    Indications  CVA (cerebral infarction) [I63.9] Long term (current) use of anticoagulants [Z79.01]        Comments         ANTICOAG TODAY: Anticoagulation Summary as of 01/31/2014    INR goal 2.0-3.0  Selected INR 2.30 (01/31/2014)  Next INR check 02/28/2014  Target end date Indefinite   Indications  CVA (cerebral infarction) [I63.9] Long term (current) use of anticoagulants [Z79.01]      Anticoagulation Episode Summary    [facility]   Preferred lab    Send INR reminders to    Comments       PATIENT INSTRUCTIONS: Patient Instructions  Patient instructed to take medications as defined in the Anti-coagulation Track section of this encounter.  Patient instructed to take today's dose.  Patient verbalized understanding of these instructions.       FOLLOW-UP Return in 4 weeks (on 02/28/2014) for Follow up INR at 1045h.  [name], III Pharm.D., CACP

## 2014-01-31 NOTE — Progress Notes (Signed)
Indication: Atrial fibrillation. Duration: Lifelong. INR: At target. Agree with Dr. Gladstone Pih assessment and plan.

## 2014-02-28 ENCOUNTER — Ambulatory Visit: Payer: Medicare Other

## 2014-03-07 ENCOUNTER — Ambulatory Visit (INDEPENDENT_AMBULATORY_CARE_PROVIDER_SITE_OTHER): Payer: Medicare Other | Admitting: Pharmacist

## 2014-03-07 DIAGNOSIS — Z7901 Long term (current) use of anticoagulants: Secondary | ICD-10-CM

## 2014-03-07 DIAGNOSIS — I639 Cerebral infarction, unspecified: Secondary | ICD-10-CM

## 2014-03-07 LAB — POCT INR: INR: 2.5

## 2014-03-07 NOTE — Progress Notes (Signed)
Patient on anticoagulation for atrial fibrillation and CVA.  INR at goal.  I have reviewed Dr. Gladstone Pih note.

## 2014-03-07 NOTE — Progress Notes (Signed)
Anti-Coagulation Progress Note  Jonathan Reed is a 68 y.o. male who is currently on an anti-coagulation regimen.    RECENT RESULTS: Recent results are below, the most recent result is correlated with a dose of 35 mg. per week: Lab Results  Component Value Date   INR 2.50 03/07/2014   INR 2.30 01/31/2014   INR 2.20 01/03/2014   PROTIME 30.0* 05/15/2012    ANTI-COAG DOSE: Anticoagulation Dose Instructions as of 03/07/2014      [name] Tue Wed Thu Fri Sat   New Dose 5 mg 5 mg 5 mg 5 mg 5 mg 5 mg 5 mg       ANTICOAG SUMMARY: Anticoagulation Episode Summary    Current INR goal 2.0-3.0  Next INR check 04/11/2014  INR from last check 2.50 (03/07/2014)  Weekly max dose   Target end date Indefinite  [facility] check location [facility]  Preferred lab   Send INR reminders to    Indications  CVA (cerebral infarction) [I63.9] Long term (current) use of anticoagulants [Z79.01]        Comments         ANTICOAG TODAY: Anticoagulation Summary as of 03/07/2014    INR goal 2.0-3.0  Selected INR 2.50 (03/07/2014)  Next INR check 04/11/2014  Target end date Indefinite   Indications  CVA (cerebral infarction) [I63.9] Long term (current) use of anticoagulants [Z79.01]      Anticoagulation Episode Summary    [facility] check [facility]   Preferred lab    Send INR reminders to    Comments       PATIENT INSTRUCTIONS: Patient Instructions  Patient instructed to take medications as defined in the Anti-coagulation Track section of this encounter.  Patient instructed to take today's dose.  Patient verbalized understanding of these instructions.       FOLLOW-UP Return in 5 weeks (on 04/11/2014) for Follow up INR at 1015h.  [name], III Pharm.D., CACP

## 2014-03-07 NOTE — Patient Instructions (Signed)
Patient instructed to take medications as defined in the Anti-coagulation Track section of this encounter.  Patient instructed to take today's dose.  Patient verbalized understanding of these instructions.    

## 2014-04-11 ENCOUNTER — Ambulatory Visit (INDEPENDENT_AMBULATORY_CARE_PROVIDER_SITE_OTHER): Payer: Medicare Other | Admitting: Pharmacist

## 2014-04-11 DIAGNOSIS — I6350 Cerebral infarction due to unspecified occlusion or stenosis of unspecified cerebral artery: Secondary | ICD-10-CM

## 2014-04-11 DIAGNOSIS — Z7901 Long term (current) use of anticoagulants: Secondary | ICD-10-CM

## 2014-04-11 DIAGNOSIS — Z8673 Personal history of transient ischemic attack (TIA), and cerebral infarction without residual deficits: Secondary | ICD-10-CM

## 2014-04-11 LAB — POCT INR: INR: 3.6

## 2014-04-11 NOTE — Patient Instructions (Signed)
Patient instructed to take medications as defined in the Anti-coagulation Track section of this encounter.  Patient instructed to take today's dose.  Patient verbalized understanding of these instructions.    

## 2014-04-11 NOTE — Progress Notes (Signed)
Anti-Coagulation Progress Note  Jonathan Reed is a 69 y.o. male who is currently on an anti-coagulation regimen.    RECENT RESULTS: Recent results are below, the most recent result is correlated with a dose of 35 mg. per week: Lab Results  Component Value Date   INR 3.60 04/11/2014   INR 2.50 03/07/2014   INR 2.30 01/31/2014   PROTIME 30.0* 05/15/2012    ANTI-COAG DOSE: Anticoagulation Dose Instructions as of 04/11/2014      Sun Mon Tue Wed Thu Fri Sat   New Dose 5 mg 2.5 mg 5 mg 5 mg 2.5 mg 5 mg 5 mg       ANTICOAG SUMMARY: Anticoagulation Episode Summary    Current INR goal 2.0-3.0  Next INR check 05/02/2014  INR from last check 3.60! (04/11/2014)  Weekly max dose   Target end date Indefinite  [facility]  Preferred lab   Send INR reminders to    Indications  CVA (cerebral infarction) [I63.9] Long term (current) use of anticoagulants [Z79.01]        Comments         ANTICOAG TODAY: Anticoagulation Summary as of 04/11/2014    INR goal 2.0-3.0  Selected INR 3.60! (04/11/2014)  Next INR check 05/02/2014  Target end date Indefinite   Indications  CVA (cerebral infarction) [I63.9] Long term (current) use of anticoagulants [Z79.01]      Anticoagulation Episode Summary    [facility] check [facility]   Preferred lab    Send INR reminders to    Comments       PATIENT INSTRUCTIONS: Patient Instructions  Patient instructed to take medications as defined in the Anti-coagulation Track section of this encounter.  Patient instructed to take today's dose.  Patient verbalized understanding of these instructions.       FOLLOW-UP Return in 3 weeks (on 05/02/2014) for Follow up INR at 1000h.  [name], III Pharm.D., CACP

## 2014-04-28 ENCOUNTER — Telehealth: Payer: Self-pay | Admitting: Pharmacist

## 2014-04-28 NOTE — Telephone Encounter (Signed)
Call to patient to confirm appointment for 05/02/14 at 10:00 lmtcb

## 2014-05-02 ENCOUNTER — Ambulatory Visit: Payer: Medicare Other

## 2014-05-03 ENCOUNTER — Ambulatory Visit (INDEPENDENT_AMBULATORY_CARE_PROVIDER_SITE_OTHER): Payer: Medicare Other | Admitting: Pharmacist

## 2014-05-03 DIAGNOSIS — I4891 Unspecified atrial fibrillation: Secondary | ICD-10-CM

## 2014-05-03 DIAGNOSIS — I639 Cerebral infarction, unspecified: Secondary | ICD-10-CM

## 2014-05-03 DIAGNOSIS — Z7901 Long term (current) use of anticoagulants: Secondary | ICD-10-CM

## 2014-05-03 LAB — POCT INR: INR: 2.8

## 2014-05-03 NOTE — Progress Notes (Signed)
Anti-Coagulation Progress Note  JORIEL STREETY is a 69 y.o. male who is currently on an anti-coagulation regimen.    RECENT RESULTS: Recent results are below, the most recent result is correlated with a dose of 30 mg. per week: Lab Results  Component Value Date   INR 2.80 05/03/2014   INR 3.60 04/11/2014   INR 2.50 03/07/2014   PROTIME 30.0* 05/15/2012    ANTI-COAG DOSE: Anticoagulation Dose Instructions as of 05/03/2014      Sun Mon Tue Wed Thu Fri Sat   New Dose 5 mg 2.5 mg 5 mg 5 mg 2.5 mg 5 mg 5 mg       ANTICOAG SUMMARY: Anticoagulation Episode Summary    Current INR goal 2.0-3.0  Next INR check 05/30/2014  INR from last check 2.80 (05/03/2014)  Weekly max dose   Target end date Indefinite  [facility]  Preferred lab   Send INR reminders to    Indications  Long term (current) use of anticoagulants [Z79.01] CVA (cerebral infarction) [I63.9] A-fib [I48.91]        Comments         ANTICOAG TODAY: Anticoagulation Summary as of 05/03/2014    INR goal 2.0-3.0  Selected INR 2.80 (05/03/2014)  Next INR check 05/30/2014  Target end date Indefinite   Indications  Long term (current) use of anticoagulants [Z79.01] CVA (cerebral infarction) [I63.9] A-fib [I48.91]      Anticoagulation Episode Summary    [facility] check [facility]   Preferred lab    Send INR reminders to    Comments       PATIENT INSTRUCTIONS: Patient Instructions  Patient instructed to take medications as defined in the Anti-coagulation Track section of this encounter.  Patient instructed to take today's dose.  Patient verbalized understanding of these instructions.       FOLLOW-UP Return in 4 weeks (on 05/30/2014) for Follow up INR at 1000h.  [name], III Pharm.D., CACP

## 2014-05-03 NOTE — Patient Instructions (Signed)
Patient instructed to take medications as defined in the Anti-coagulation Track section of this encounter.  Patient instructed to take today's dose.  Patient verbalized understanding of these instructions.    

## 2014-05-30 ENCOUNTER — Ambulatory Visit: Payer: Medicare Other

## 2014-05-31 ENCOUNTER — Ambulatory Visit (INDEPENDENT_AMBULATORY_CARE_PROVIDER_SITE_OTHER): Payer: Medicare Other | Admitting: Pharmacist

## 2014-05-31 DIAGNOSIS — Z7901 Long term (current) use of anticoagulants: Secondary | ICD-10-CM

## 2014-05-31 DIAGNOSIS — I639 Cerebral infarction, unspecified: Secondary | ICD-10-CM

## 2014-05-31 DIAGNOSIS — Z8673 Personal history of transient ischemic attack (TIA), and cerebral infarction without residual deficits: Secondary | ICD-10-CM

## 2014-05-31 DIAGNOSIS — I4891 Unspecified atrial fibrillation: Secondary | ICD-10-CM

## 2014-05-31 LAB — POCT INR: INR: 2

## 2014-05-31 NOTE — Progress Notes (Signed)
Anti-Coagulation Progress Note  Jonathan Reed is a 69 y.o. male who is currently on an anti-coagulation regimen.    RECENT RESULTS: Recent results are below, the most recent result is correlated with a dose of 30 mg. per week: Lab Results  Component Value Date   INR 2.0 05/31/2014   INR 2.80 05/03/2014   INR 3.60 04/11/2014   PROTIME 30.0* 05/15/2012    ANTI-COAG DOSE: Anticoagulation Dose Instructions as of 05/31/2014      [name] Tue Wed Thu Fri Sat   New Dose 5 mg 5 mg 5 mg 5 mg 5 mg 5 mg 5 mg       ANTICOAG SUMMARY: Anticoagulation Episode Summary    Current INR goal 2.0-3.0  Next INR check 06/27/2014  INR from last check 2.0 (05/31/2014)  Weekly max dose   Target end date Indefinite  [facility]  Preferred lab   Send INR reminders to    Indications  Long term (current) use of anticoagulants [Z79.01] CVA (cerebral infarction) [I63.9] A-fib [I48.91]        Comments         ANTICOAG TODAY: Anticoagulation Summary as of 05/31/2014    INR goal 2.0-3.0  Selected INR 2.0 (05/31/2014)  Next INR check 06/27/2014  Target end date Indefinite   Indications  Long term (current) use of anticoagulants [Z79.01] CVA (cerebral infarction) [I63.9] A-fib [I48.91]      Anticoagulation Episode Summary    [facility] check [facility]   Preferred lab    Send INR reminders to    Comments       PATIENT INSTRUCTIONS: Patient Instructions  Patient instructed to take medications as defined in the Anti-coagulation Track section of this encounter.  Patient instructed to take today's dose.  Patient verbalized understanding of these instructions.       FOLLOW-UP Return in 4 weeks (on 06/27/2014) for Follow up INR at 0945h.  [name], III Pharm.D., CACP

## 2014-05-31 NOTE — Patient Instructions (Signed)
Patient instructed to take medications as defined in the Anti-coagulation Track section of this encounter.  Patient instructed to take today's dose.  Patient verbalized understanding of these instructions.    

## 2014-06-01 ENCOUNTER — Ambulatory Visit (INDEPENDENT_AMBULATORY_CARE_PROVIDER_SITE_OTHER): Payer: Medicare Other

## 2014-06-01 ENCOUNTER — Ambulatory Visit (INDEPENDENT_AMBULATORY_CARE_PROVIDER_SITE_OTHER): Payer: Medicare Other | Admitting: Family Medicine

## 2014-06-01 VITALS — BP 110/78 | HR 68 | Temp 98.6°F | Resp 16 | Ht 70.0 in | Wt 197.0 lb

## 2014-06-01 DIAGNOSIS — Z8505 Personal history of malignant neoplasm of liver: Secondary | ICD-10-CM

## 2014-06-01 DIAGNOSIS — M545 Low back pain, unspecified: Secondary | ICD-10-CM

## 2014-06-01 DIAGNOSIS — I6350 Cerebral infarction due to unspecified occlusion or stenosis of unspecified cerebral artery: Secondary | ICD-10-CM | POA: Diagnosis not present

## 2014-06-01 DIAGNOSIS — Z8679 Personal history of other diseases of the circulatory system: Secondary | ICD-10-CM | POA: Diagnosis not present

## 2014-06-01 MED ORDER — METHOCARBAMOL 500 MG PO TABS: 500.0000 mg | ORAL_TABLET | Freq: Three times a day (TID) | ORAL | Status: DC | PRN

## 2014-06-01 NOTE — Patient Instructions (Signed)
I think that your back hurts due to muscle strain.  Use the robaxin (muscle relaxer) as needed but remember it can make you feel drows  Let us know if you do not feel better in the next week or so- Sooner if worse.

## 2014-06-01 NOTE — Progress Notes (Signed)
[facility] [address] [place] [address] [phone]  Date:  06/01/2014   Name:  Jonathan Reed   DOB:  09-04-1945   MRN:  [id number]  PCP:  Blain Pais, MD    Chief Complaint: Back Pain   History of Present Illness:  Jonathan Reed is a 69 y.o. very pleasant male patient who presents with the following:  He is here today with an ache in his lower back for about 3 days.  It feels" cold."  He has had pneumonia in the past which presented in the same way so he wanted to be sure that his lungs are ok.  .   He has not noted cough or fever. He has had his pneumonia vaccine.   He has tried some massage, but this did not seem to help He has liver cancer; this is treated at [facility].  It appears that he has a neuroendocrine tumor.   He is also on coumadin for intermittent atrial fibrillation No hematuria, no dysuria  No pain into his legs.   He saw his heart doctor in [place] earlier this week and had an EKG which was normal per his report  He had CT of his chest and abd in October that showed stable liver cancer, no bone lesions noted   Patient Active Problem List   Diagnosis Date Noted  . Tooth ache 01/04/2014  . Metastasis to liver 10/06/2013  . Dyslipidemia 06/30/2013  . A-fib 06/28/2013  . Automatic implantable cardioverter-defibrillator in situ 06/28/2013  . CKD (chronic kidney disease) 06/28/2013  . Health care maintenance 06/28/2013  . Abdominal pain, unspecified site 01/06/2013  . Long term (current) use of anticoagulants 04/27/2012  . CAD (coronary artery disease)   . CVA (cerebral infarction)   . CHF (congestive heart failure)   . Hypertension 04/21/2012  . Metastatic malignant neuroendocrine tumor to liver 04/14/2012  . Ischemic cardiomyopathy with history of Vtach 04/12/2012    Past Medical History  Diagnosis Date  . CHF (congestive heart failure)     with ICD (f/u with [facility])  . CVA (cerebral infarction) 2011    No  residual Sx  . CAD (coronary artery disease) 2000  . HTN (hypertension)   . Nausea alone 01/06/2013  . Abdominal pain, unspecified site 01/06/2013  . Stroke   . Carcinoid syndrome   . Metastatic malignant neuroendocrine tumor to liver   . Metastasis to liver 10/06/2013  . Tooth ache 01/04/2014    Past Surgical History  Procedure Laterality Date  . Cardiac defibrillator placement    . Inguinal hernia repair Bilateral   . Sinus surgery with instatrak    . Colonoscopy  1980    colon polyps, no f/u since.     History  Substance Use Topics  . Smoking status: Never Smoker   . Smokeless tobacco: Never Used  . Alcohol Use: No    Family History  Problem Relation Age of Onset  . Stroke Mother   . Diabetes Father     diabetic coma  . Diabetes Sister   . Cancer Sister     intestinal ca?  . Prostate cancer Brother     prostate  . Breast cancer Maternal Aunt   . Clotting disorder Brother   . Heart disease Brother   . Kidney disease Brother   . Stroke Sister   . Hypertension Daughter     No Known Allergies  Medication list has been reviewed  and updated.  Current Outpatient Prescriptions on File Prior to Visit  Medication Sig Dispense Refill  . lisinopril-hydrochlorothiazide (PRINZIDE,ZESTORETIC) 20-25 MG per tablet Take 1 tablet by mouth daily. 90 tablet 2  . metoprolol succinate (TOPROL-XL) 50 MG 24 hr tablet Take 1 tablet (50 mg total) by mouth daily. Take with or immediately following a meal. 30 tablet 2  . spironolactone (ALDACTONE) 25 MG tablet Take 25 mg by mouth daily.    Marland Kitchen warfarin (COUMADIN) 5 MG tablet Take 1 tablet by mouth daily.  **Patient requests 90 day supply** 90 tablet 3  . Ascorbic Acid (VITAMIN C) 500 MG/5ML LIQD Take 10 mLs (1,000 mg total) by mouth every morning. (Patient not taking: Reported on 06/01/2014) 236 mL 0  . oxyCODONE (ROXICODONE) 5 MG immediate release tablet Take 1 tablet (5 mg total) by mouth every 6 (six) hours as needed for moderate pain.  (Patient not taking: Reported on 06/01/2014) 30 tablet 0   No current facility-administered medications on file prior to visit.    Review of Systems:  As per HPI- otherwise negative.   Physical Examination: Filed Vitals:   06/01/14 1016  BP: 110/78  Pulse: 68  Temp: 98.6 F (37 C)  Resp: 16   Filed Vitals:   06/01/14 1016  Height: 5\' 10"  (1.778 m)  Weight: 197 lb (89.359 kg)   Body mass index is 28.27 kg/(m^2). Ideal Body Weight: Weight in (lb) to have BMI = 25: 173.9  GEN: WDWN, NAD, Non-toxic, A & O x 3, looks well HEENT: Atraumatic, Normocephalic. Neck supple. No masses, No LAD. Ears and Nose: No external deformity. CV: RRR, No M/G/R. No JVD. No thrill. No extra heart sounds. PULM: CTA B, no wheezes, crackles, rhonchi. No retractions. No resp. distress. No accessory muscle use. ABD: S, NT, ND EXTR: No c/c/e NEURO Normal gait.  PSYCH: Normally interactive. Conversant. Not depressed or anxious appearing.  Calm demeanor.  Not able to reproduce his back tenderness on exam but he indicates the thoracic and lumbar muscles as the location of his pain. Normal spine flexion and extension, negative SLR bilaterally BLE with normal strength, sensation and DTR.   [facility] reading (PRIMARY) by  Dr. Lorelei Pont. Chest: stable from previous, pacemaker in place, stable cardiomegaly  Lumbar: negative   CHEST 2 VIEW  COMPARISON: 11/06/2013  FINDINGS: Left pacer remains in place, unchanged. Heart is normal size. Tortuosity of the thoracic aorta. Lungs are clear. No effusions. No acute bony abnormality.  IMPRESSION: No active cardiopulmonary disease.  LUMBAR SPINE - COMPLETE 4+ VIEW  COMPARISON: None.  FINDINGS: Four non rib-bearing lumbar vertebra are noted. A transitional segment is noted at the lumbosacral junction. Vertebral body height is well maintained. Mild disc space narrowing is noted at the lumbosacral junction. No pars defects are seen. Mild aortic calcifications  are noted.  IMPRESSION: Mild degenerative change without acute abnormality.  Assessment and Plan: Midline low back pain without sciatica - Plan: DG Chest 2 View, DG Lumbar Spine Complete, methocarbamol (ROBAXIN) 500 MG tablet  History of liver cancer  History of atrial fibrillation reassurance that he does not appear to have pneumonia.  Will try robaxin for likely muscular back pain At this time he seems to be in sinus rhythm Asked him to let me know if not feeling better soon- Sooner if worse.     Signed Lamar Blinks, MD

## 2014-06-23 ENCOUNTER — Telehealth: Payer: Self-pay | Admitting: Pharmacist

## 2014-06-23 NOTE — Telephone Encounter (Signed)
Call to patient to confirm appointment for 06/27/14 at 10:00 lmtcb

## 2014-06-27 ENCOUNTER — Ambulatory Visit (INDEPENDENT_AMBULATORY_CARE_PROVIDER_SITE_OTHER): Payer: Medicare Other | Admitting: Pharmacist

## 2014-06-27 DIAGNOSIS — I4891 Unspecified atrial fibrillation: Secondary | ICD-10-CM

## 2014-06-27 DIAGNOSIS — I639 Cerebral infarction, unspecified: Secondary | ICD-10-CM

## 2014-06-27 DIAGNOSIS — Z7901 Long term (current) use of anticoagulants: Secondary | ICD-10-CM | POA: Diagnosis not present

## 2014-06-27 LAB — POCT INR: INR: 2.6

## 2014-06-27 MED ORDER — WARFARIN SODIUM 5 MG PO TABS: ORAL_TABLET | ORAL | Status: DC

## 2014-06-27 NOTE — Progress Notes (Signed)
Anti-Coagulation Progress Note  Jonathan Reed is a 69 y.o. male who is currently on an anti-coagulation regimen.    RECENT RESULTS: Recent results are below, the most recent result is correlated with a dose of 35 mg. per week: Lab Results  Component Value Date   INR 2.60 06/27/2014   INR 2.0 05/31/2014   INR 2.80 05/03/2014   PROTIME 30.0* 05/15/2012    ANTI-COAG DOSE: Anticoagulation Dose Instructions as of 06/27/2014      [name] Tue Wed Thu Fri Sat   New Dose 5 mg 5 mg 5 mg 5 mg 5 mg 5 mg 5 mg       ANTICOAG SUMMARY: Anticoagulation Episode Summary    Current INR goal 2.0-3.0  Next INR check 07/25/2014  INR from last check 2.60 (06/27/2014)  Weekly max dose   Target end date Indefinite  [facility]  Preferred lab   Send INR reminders to    Indications  Long term (current) use of anticoagulants [Z79.01] CVA (cerebral infarction) [I63.9] A-fib [I48.91]        Comments         ANTICOAG TODAY: Anticoagulation Summary as of 06/27/2014    INR goal 2.0-3.0  Selected INR 2.60 (06/27/2014)  Next INR check 07/25/2014  Target end date Indefinite   Indications  Long term (current) use of anticoagulants [Z79.01] CVA (cerebral infarction) [I63.9] A-fib [I48.91]      Anticoagulation Episode Summary    [facility]   Preferred lab    Send INR reminders to    Comments       PATIENT INSTRUCTIONS: Patient Instructions  Patient instructed to take medications as defined in the Anti-coagulation Track section of this encounter.  Patient instructed to take today's dose.  Patient verbalized understanding of these instructions.       FOLLOW-UP Return in 4 weeks (on 07/25/2014) for Follow up INR at 1000h.  [name], III Pharm.D., CACP

## 2014-06-27 NOTE — Patient Instructions (Signed)
Patient instructed to take medications as defined in the Anti-coagulation Track section of this encounter.  Patient instructed to take today's dose.  Patient verbalized understanding of these instructions.    

## 2014-06-30 ENCOUNTER — Telehealth: Payer: Self-pay | Admitting: *Deleted

## 2014-06-30 NOTE — Telephone Encounter (Signed)
Yes, day before, day of and day after

## 2014-06-30 NOTE — Telephone Encounter (Signed)
Pt has CT scan on 4/19 and daughter asks if he needs to hold his BP meds a few days before and after?

## 2014-06-30 NOTE — Telephone Encounter (Signed)
Left VM informing daughter for pt to hold Lisinopril-HCTZ and Spironolactone the day before the day of and the day after his CT scan.  Asked her to call us back if any questions.

## 2014-07-05 ENCOUNTER — Other Ambulatory Visit: Payer: Medicare Other

## 2014-07-05 ENCOUNTER — Ambulatory Visit (HOSPITAL_COMMUNITY)
Admission: RE | Admit: 2014-07-05 | Discharge: 2014-07-05 | Disposition: A | Discharge status: Home / Self Care | Payer: Medicare Other | Source: Ambulatory Visit | Attending: Hematology and Oncology | Admitting: Hematology and Oncology

## 2014-07-05 ENCOUNTER — Encounter (HOSPITAL_COMMUNITY): Payer: Self-pay

## 2014-07-05 ENCOUNTER — Other Ambulatory Visit (HOSPITAL_BASED_OUTPATIENT_CLINIC_OR_DEPARTMENT_OTHER): Payer: Medicare Other

## 2014-07-05 DIAGNOSIS — C7951 Secondary malignant neoplasm of bone: Secondary | ICD-10-CM | POA: Insufficient documentation

## 2014-07-05 DIAGNOSIS — C7B8 Other secondary neuroendocrine tumors: Secondary | ICD-10-CM | POA: Diagnosis present

## 2014-07-05 DIAGNOSIS — C787 Secondary malignant neoplasm of liver and intrahepatic bile duct: Secondary | ICD-10-CM

## 2014-07-05 DIAGNOSIS — C7B02 Secondary carcinoid tumors of liver: Secondary | ICD-10-CM

## 2014-07-05 DIAGNOSIS — Z79899 Other long term (current) drug therapy: Secondary | ICD-10-CM | POA: Insufficient documentation

## 2014-07-05 DIAGNOSIS — C7A00 Malignant carcinoid tumor of unspecified site: Secondary | ICD-10-CM

## 2014-07-05 LAB — CBC WITH DIFFERENTIAL/PLATELET
BASO%: 0.2 % (ref 0.0–2.0)
Basophils Absolute: 0 10*3/uL (ref 0.0–0.1)
EOS ABS: 0.1 10*3/uL (ref 0.0–0.5)
EOS%: 2.4 % (ref 0.0–7.0)
HCT: 42.4 % (ref 38.4–49.9)
HEMOGLOBIN: 14.3 g/dL (ref 13.0–17.1)
LYMPH#: 1.3 10*3/uL (ref 0.9–3.3)
LYMPH%: 26.1 % (ref 14.0–49.0)
MCH: 31.6 pg (ref 27.2–33.4)
MCHC: 33.7 g/dL (ref 32.0–36.0)
MCV: 93.6 fL (ref 79.3–98.0)
MONO#: 0.6 10*3/uL (ref 0.1–0.9)
MONO%: 11.2 % (ref 0.0–14.0)
NEUT%: 60.1 % (ref 39.0–75.0)
NEUTROS ABS: 3.1 10*3/uL (ref 1.5–6.5)
Platelets: 178 10*3/uL (ref 140–400)
RBC: 4.53 10*6/uL (ref 4.20–5.82)
RDW: 15 % — AB (ref 11.0–14.6)
WBC: 5.1 10*3/uL (ref 4.0–10.3)

## 2014-07-05 LAB — COMPREHENSIVE METABOLIC PANEL (CC13)
ALBUMIN: 3.8 g/dL (ref 3.5–5.0)
ALK PHOS: 86 U/L (ref 40–150)
ALT: 25 U/L (ref 0–55)
AST: 19 U/L (ref 5–34)
Anion Gap: 11 mEq/L (ref 3–11)
BUN: 20.8 mg/dL (ref 7.0–26.0)
CO2: 24 mEq/L (ref 22–29)
CREATININE: 1.2 mg/dL (ref 0.7–1.3)
Calcium: 9.6 mg/dL (ref 8.4–10.4)
Chloride: 106 mEq/L (ref 98–109)
EGFR: 72 mL/min/{1.73_m2} — AB (ref >90)
GLUCOSE: 104 mg/dL (ref 70–140)
Potassium: 4.2 mEq/L (ref 3.5–5.1)
Sodium: 141 mEq/L (ref 136–145)
Total Bilirubin: 0.29 mg/dL (ref 0.20–1.20)
Total Protein: 7.4 g/dL (ref 6.4–8.3)

## 2014-07-05 MED ORDER — IOHEXOL 300 MG/ML  SOLN
100.0000 mL | Freq: Once | INTRAMUSCULAR | Status: AC | PRN
  Administered 2014-07-05: 100 mL via INTRAVENOUS

## 2014-07-06 ENCOUNTER — Encounter: Payer: Self-pay | Admitting: Hematology and Oncology

## 2014-07-06 ENCOUNTER — Telehealth: Payer: Self-pay | Admitting: Hematology and Oncology

## 2014-07-06 ENCOUNTER — Ambulatory Visit (HOSPITAL_BASED_OUTPATIENT_CLINIC_OR_DEPARTMENT_OTHER): Payer: Medicare Other | Admitting: Hematology and Oncology

## 2014-07-06 VITALS — BP 131/85 | HR 79 | Temp 98.0°F | Resp 18 | Ht 70.0 in | Wt 202.9 lb

## 2014-07-06 DIAGNOSIS — C7A00 Malignant carcinoid tumor of unspecified site: Secondary | ICD-10-CM

## 2014-07-06 DIAGNOSIS — C7B02 Secondary carcinoid tumors of liver: Secondary | ICD-10-CM | POA: Diagnosis not present

## 2014-07-06 DIAGNOSIS — C7B8 Other secondary neuroendocrine tumors: Secondary | ICD-10-CM

## 2014-07-06 DIAGNOSIS — I638 Other cerebral infarction: Secondary | ICD-10-CM

## 2014-07-06 DIAGNOSIS — I6389 Other cerebral infarction: Secondary | ICD-10-CM

## 2014-07-06 NOTE — Telephone Encounter (Signed)
Gave and printed appt sched and avs for pt for April thru June °

## 2014-07-07 ENCOUNTER — Encounter: Payer: Self-pay | Admitting: Hematology and Oncology

## 2014-07-07 NOTE — Assessment & Plan Note (Signed)
He has no permanent long-term neurological deficit. He is on aggressive risk factor modifications He'll remain on anticoagulation therapy. He is aware about risk of bruising while on treatment

## 2014-07-07 NOTE — Assessment & Plan Note (Signed)
We reviewed the most recent NCCN guidelines. Due to significant disease burden, I would recommend we proceed with octreotide injection. The risk, benefit, side effects of Octreotide were discussed with the patient and family members. Patient education handout was given. I will start him on first injection next week. I will start with 20 mg IM injection. If he tolerates that well, we might increase to 30 mg. He is warned about risk of bruising while on anticoagulation therapy.

## 2014-07-07 NOTE — Progress Notes (Signed)
I called to let monica know the form is ready for pick up at ms. [facility] desk.

## 2014-07-07 NOTE — Progress Notes (Signed)
[facility] OFFICE PROGRESS NOTE  Patient Care Team: Blain Pais, MD as PCP - General (Internal Medicine) Estill Dooms, MD as Referring Physician (Surgery) Heath Lark, MD as Consulting Physician (Hematology and Oncology)  SUMMARY OF ONCOLOGIC HISTORY:  In January of 2014 the patient was hospitalized for shortness of breath. He presented also with some epigastric pain and imaging studies review abnormal liver mass.  On 04/15/2012 he underwent biopsy which showed well differentiated neuroendocrine tumor.  Since February 2014, he was placed on palliative chemotherapy with Xeloda 1000 mg per meter squared twice a day for 2 weeks, off 2 weeks and Temodar 200 mg per meter squared daily on days 10-14, each cycle is a 28 day cycle.  May 2014 CT scan show partial response.  September 2014 repeat imaging study show continued response to treatment. January 2015, repeat imaging study showed mixed response In April 2015, repeat imaging study shows stable disease. After a lot of discussion, the patient is in agreement to stop treatment for chemotherapy holiday In July 2015, repeat imaging studies show mild disease progression. The patient elected for observation. It October 2015, repeat imaging studies showed stable disease. He is observed. On 07/05/2014, repeat CT scan of the abdomen show significant disease progression INTERVAL HISTORY: Please see below for problem oriented charting. He feels well. Denies any abdominal pain. No recent weight loss. He remained on chronic anticoagulation therapy. The patient denies any recent signs or symptoms of bleeding such as spontaneous epistaxis, hematuria or hematochezia.   REVIEW OF SYSTEMS:   Constitutional: Denies fevers, chills or abnormal weight loss Eyes: Denies blurriness of vision Ears, nose, mouth, throat, and face: Denies mucositis or sore throat Respiratory: Denies cough, dyspnea or wheezes Cardiovascular: Denies palpitation,  chest discomfort or lower extremity swelling Gastrointestinal:  Denies nausea, heartburn or change in bowel habits Skin: Denies abnormal skin rashes Lymphatics: Denies new lymphadenopathy or easy bruising Neurological:Denies numbness, tingling or new weaknesses Behavioral/Psych: Mood is stable, no new changes  All other systems were reviewed with the patient and are negative.  I have reviewed the past medical history, past surgical history, social history and family history with the patient and they are unchanged from previous note.  ALLERGIES:  has No Known Allergies.  MEDICATIONS:  Current Outpatient Prescriptions  Medication Sig Dispense Refill  . lisinopril-hydrochlorothiazide (PRINZIDE,ZESTORETIC) 20-25 MG per tablet Take 1 tablet by mouth daily. 90 tablet 2  . methocarbamol (ROBAXIN) 500 MG tablet Take 1 tablet (500 mg total) by mouth every 8 (eight) hours as needed. 30 tablet 0  . metoprolol succinate (TOPROL-XL) 50 MG 24 hr tablet Take 1 tablet (50 mg total) by mouth daily. Take with or immediately following a meal. 30 tablet 2  . spironolactone (ALDACTONE) 25 MG tablet Take 25 mg by mouth daily.    Marland Kitchen warfarin (COUMADIN) 5 MG tablet Take 1 tablet by mouth daily.  **Patient requests 90 day supply** 90 tablet 3   No current facility-administered medications for this visit.    PHYSICAL EXAMINATION: ECOG PERFORMANCE STATUS: 0 - Asymptomatic  Filed Vitals:   07/06/14 0837  BP: 131/85  Pulse: 79  Temp: 98 F (36.7 C)  Resp: 18   Filed Weights   07/06/14 0837  Weight: 202 lb 14.4 oz (92.035 kg)    GENERAL:alert, no distress and comfortable SKIN: skin color, texture, turgor are normal, no rashes or significant lesions EYES: normal, Conjunctiva are pink and non-injected, sclera clear OROPHARYNX:no exudate, no erythema and lips, buccal mucosa, and  tongue normal  NECK: supple, thyroid normal size, non-tender, without nodularity LYMPH:  no palpable lymphadenopathy in the  cervical, axillary or inguinal LUNGS: clear to auscultation and percussion with normal breathing effort HEART: regular rate & rhythm and no murmurs and no lower extremity edema ABDOMEN:abdomen soft, non-tender and normal bowel sounds Musculoskeletal:no cyanosis of digits and no clubbing  NEURO: alert & oriented x 3 with fluent speech, no focal motor/sensory deficits  LABORATORY DATA:  I have reviewed the data as listed    Component Value Date/Time   NA 141 07/05/2014 0822   NA 138 06/28/2013 1014   K 4.2 07/05/2014 0822   K 4.1 06/28/2013 1014   CL 103 06/28/2013 1014   CL 105 08/24/2012 0849   CO2 24 07/05/2014 0822   CO2 21 06/28/2013 1014   GLUCOSE 104 07/05/2014 0822   GLUCOSE 86 06/28/2013 1014   GLUCOSE 100* 08/24/2012 0849   BUN 20.8 07/05/2014 0822   BUN 19 06/28/2013 1014   CREATININE 1.2 07/05/2014 0822   CREATININE 1.43* 06/28/2013 1014   CREATININE 1.09 04/16/2012 1016   CALCIUM 9.6 07/05/2014 0822   CALCIUM 10.2 06/28/2013 1014   PROT 7.4 07/05/2014 0822   PROT 7.5 06/28/2013 1014   ALBUMIN 3.8 07/05/2014 0822   ALBUMIN 4.5 06/28/2013 1014   AST 19 07/05/2014 0822   AST 17 06/28/2013 1014   ALT 25 07/05/2014 0822   ALT 15 06/28/2013 1014   ALKPHOS 86 07/05/2014 0822   ALKPHOS 60 06/28/2013 1014   BILITOT 0.29 07/05/2014 0822   BILITOT 0.6 06/28/2013 1014   GFRNONAA 51* 06/28/2013 1014   GFRNONAA 70* 04/16/2012 1016   GFRAA 59* 06/28/2013 1014   GFRAA 81* 04/16/2012 1016    No results found for: SPEP, UPEP  Lab Results  Component Value Date   WBC 5.1 07/05/2014   NEUTROABS 3.1 07/05/2014   HGB 14.3 07/05/2014   HCT 42.4 07/05/2014   MCV 93.6 07/05/2014   PLT 178 07/05/2014      Chemistry      Component Value Date/Time   NA 141 07/05/2014 0822   NA 138 06/28/2013 1014   K 4.2 07/05/2014 0822   K 4.1 06/28/2013 1014   CL 103 06/28/2013 1014   CL 105 08/24/2012 0849   CO2 24 07/05/2014 0822   CO2 21 06/28/2013 1014   BUN 20.8 07/05/2014  0822   BUN 19 06/28/2013 1014   CREATININE 1.2 07/05/2014 0822   CREATININE 1.43* 06/28/2013 1014   CREATININE 1.09 04/16/2012 1016      Component Value Date/Time   CALCIUM 9.6 07/05/2014 0822   CALCIUM 10.2 06/28/2013 1014   ALKPHOS 86 07/05/2014 0822   ALKPHOS 60 06/28/2013 1014   AST 19 07/05/2014 0822   AST 17 06/28/2013 1014   ALT 25 07/05/2014 0822   ALT 15 06/28/2013 1014   BILITOT 0.29 07/05/2014 0822   BILITOT 0.6 06/28/2013 1014       RADIOGRAPHIC STUDIES: I reviewed all his recent imaging study with patient and family members I have personally reviewed the radiological images as listed and agreed with the findings in the report.   ASSESSMENT & PLAN:  Metastatic malignant neuroendocrine tumor to liver We reviewed the most recent NCCN guidelines. Due to significant disease burden, I would recommend we proceed with octreotide injection. The risk, benefit, side effects of Octreotide were discussed with the patient and family members. Patient education handout was given. I will start him on first injection next week.  I will start with 20 mg IM injection. If he tolerates that well, we might increase to 30 mg. He is warned about risk of bruising while on anticoagulation therapy.   CVA (cerebral infarction) He has no permanent long-term neurological deficit. He is on aggressive risk factor modifications He'll remain on anticoagulation therapy. He is aware about risk of bruising while on treatment     Orders Placed This Encounter  Procedures  . CBC with Differential/Platelet    Standing Status: Standing     Number of Occurrences: 9     Standing Expiration Date: 07/06/2015  . Comprehensive metabolic panel    Standing Status: Standing     Number of Occurrences: 9     Standing Expiration Date: 07/06/2015   All questions were answered. The patient knows to call the clinic with any problems, questions or concerns. No barriers to learning was detected. I spent 30  minutes counseling the patient face to face. The total time spent in the appointment was 40 minutes and more than 50% was on counseling and review of test results     [facility], [place], MD 07/07/2014 11:23 AM

## 2014-07-13 ENCOUNTER — Ambulatory Visit (HOSPITAL_BASED_OUTPATIENT_CLINIC_OR_DEPARTMENT_OTHER): Payer: Medicare Other

## 2014-07-13 VITALS — BP 116/72 | HR 68 | Temp 98.5°F

## 2014-07-13 DIAGNOSIS — C7B02 Secondary carcinoid tumors of liver: Secondary | ICD-10-CM

## 2014-07-13 DIAGNOSIS — C7B8 Other secondary neuroendocrine tumors: Secondary | ICD-10-CM

## 2014-07-13 DIAGNOSIS — C7A00 Malignant carcinoid tumor of unspecified site: Secondary | ICD-10-CM

## 2014-07-13 MED ORDER — OCTREOTIDE ACETATE 20 MG IM KIT
20.0000 mg | PACK | Freq: Once | INTRAMUSCULAR | Status: AC
  Administered 2014-07-13: 20 mg via INTRAMUSCULAR
  Filled 2014-07-13: qty 1

## 2014-07-13 NOTE — Patient Instructions (Signed)

## 2014-07-18 ENCOUNTER — Other Ambulatory Visit: Payer: Self-pay | Admitting: Hematology and Oncology

## 2014-07-18 ENCOUNTER — Telehealth: Payer: Self-pay | Admitting: *Deleted

## 2014-07-18 NOTE — Telephone Encounter (Addendum)
LEFT MESSAGE ON PT.'S VOICE MAIL TO RETURN A CALL TO TRIAGE. PT. RETURNED A CALL TO TRIAGE. HE RECEIVED SANDOSTATIN ON 07/13/14. SINCE Thursday PT. HAS HAD INTERMITTENT PAIN IN HIS RIGHT HIP WHERE HE RECEIVED THE INJECTION. PT. HAS TAKEN ALEVE WHICH HELPS. ALSO ON Thursday HIS RIGHT UPPER ARM "NEAR HIS SHOULDER" STARTED ITCHING AND THE SKIN IN THAT AREA THE SIZE OF A FIFTY CENT PIECE HAS BECOME "DARK". NO RASH NOTED. PT. AND HIS DAUGHTER FEEL THESE SYMPTOMS ARE FROM THE MEDICATION PT. RECEIVED. THEY WOULD LIKE TO KNOW WHAT [facility] THINKS.

## 2014-07-18 NOTE — Telephone Encounter (Signed)
Not a direct side-effects from Sandostatin. The inj site hurt from the needle itself. Recommend continue Advil prn for pain. Not sure what the rash is, keep an eye on it for now. Call back Friday if not better

## 2014-07-18 NOTE — Telephone Encounter (Signed)
PER DR.GORSUCH- THE INJECTION SITE HURTS BECAUSE OF THE NEEDLE. PT. MAY USE ADVIL FOR THIS PAIN. UNSURE ABOUT THE RASH. KEEP AN EYE ON THE RASH. CALL Friday IF NOT BETTER. NOTIFIED PT. HE VOICES UNDERSTANDING.

## 2014-07-21 ENCOUNTER — Telehealth: Payer: Self-pay | Admitting: Pharmacist

## 2014-07-21 NOTE — Telephone Encounter (Signed)
Call to patient to confirm appointment for 07/25/14 at 10:00 lmtcb

## 2014-07-25 ENCOUNTER — Ambulatory Visit (INDEPENDENT_AMBULATORY_CARE_PROVIDER_SITE_OTHER): Payer: Medicare Other | Admitting: Pharmacist

## 2014-07-25 DIAGNOSIS — I4891 Unspecified atrial fibrillation: Secondary | ICD-10-CM | POA: Diagnosis not present

## 2014-07-25 DIAGNOSIS — I63239 Cerebral infarction due to unspecified occlusion or stenosis of unspecified carotid arteries: Secondary | ICD-10-CM

## 2014-07-25 DIAGNOSIS — Z8673 Personal history of transient ischemic attack (TIA), and cerebral infarction without residual deficits: Secondary | ICD-10-CM | POA: Diagnosis not present

## 2014-07-25 DIAGNOSIS — Z7901 Long term (current) use of anticoagulants: Secondary | ICD-10-CM | POA: Diagnosis not present

## 2014-07-25 LAB — POCT INR: INR: 3.6

## 2014-07-25 NOTE — Progress Notes (Signed)
INTERNAL MEDICINE TEACHING ATTENDING ADDENDUM - Bently Wyss M.D  Duration- indefinite, Indication- afib, INR- supratherapeutic. Agree with pharmacy recommendations as outlined in their note.     

## 2014-07-25 NOTE — Progress Notes (Signed)
Anti-Coagulation Progress Note  Jonathan Reed is a 69 y.o. male who is currently on an anti-coagulation regimen.    RECENT RESULTS: Recent results are below, the most recent result is correlated with a dose of 35 mg. per week: Lab Results  Component Value Date   INR 3.60 07/25/2014   INR 2.60 06/27/2014   INR 2.0 05/31/2014   PROTIME 30.0* 05/15/2012    ANTI-COAG DOSE: Anticoagulation Dose Instructions as of 07/25/2014      Sun Mon Tue Wed Thu Fri Sat   New Dose 5 mg 2.5 mg 5 mg 5 mg 2.5 mg 5 mg 5 mg       ANTICOAG SUMMARY: Anticoagulation Episode Summary    Current INR goal 2.0-3.0  Next INR check 08/22/2014  INR from last check 3.60! (07/25/2014)  Weekly max dose   Target end date Indefinite  [facility] check location [facility]  Preferred lab   Send INR reminders to    Indications  Long term (current) use of anticoagulants [Z79.01] CVA (cerebral infarction) [I63.9] A-fib [I48.91]        Comments         ANTICOAG TODAY: Anticoagulation Summary as of 07/25/2014    INR goal 2.0-3.0  Selected INR 3.60! (07/25/2014)  Next INR check 08/22/2014  Target end date Indefinite   Indications  Long term (current) use of anticoagulants [Z79.01] CVA (cerebral infarction) [I63.9] A-fib [I48.91]      Anticoagulation Episode Summary    [facility] check location [facility]   Preferred lab    Send INR reminders to    Comments       PATIENT INSTRUCTIONS: Patient Instructions  Patient instructed to take medications as defined in the Anti-coagulation Track section of this encounter.  Patient instructed to take today's dose.  Patient verbalized understanding of these instructions.       FOLLOW-UP Return in 4 weeks (on 08/22/2014) for Follow up INR at 1015h.  [name], III Pharm.D., CACP

## 2014-07-25 NOTE — Patient Instructions (Signed)
Patient instructed to take medications as defined in the Anti-coagulation Track section of this encounter.  Patient instructed to take today's dose.  Patient verbalized understanding of these instructions.    

## 2014-07-29 ENCOUNTER — Encounter: Payer: Self-pay | Admitting: *Deleted

## 2014-08-10 ENCOUNTER — Other Ambulatory Visit (HOSPITAL_BASED_OUTPATIENT_CLINIC_OR_DEPARTMENT_OTHER): Payer: Medicare Other

## 2014-08-10 ENCOUNTER — Ambulatory Visit (HOSPITAL_BASED_OUTPATIENT_CLINIC_OR_DEPARTMENT_OTHER): Payer: Medicare Other

## 2014-08-10 VITALS — BP 143/97 | HR 86 | Temp 98.3°F

## 2014-08-10 DIAGNOSIS — C7A00 Malignant carcinoid tumor of unspecified site: Secondary | ICD-10-CM | POA: Diagnosis not present

## 2014-08-10 DIAGNOSIS — C7B02 Secondary carcinoid tumors of liver: Secondary | ICD-10-CM

## 2014-08-10 DIAGNOSIS — C7B8 Other secondary neuroendocrine tumors: Secondary | ICD-10-CM

## 2014-08-10 LAB — COMPREHENSIVE METABOLIC PANEL (CC13)
ALT: 20 U/L (ref 0–55)
ANION GAP: 12 meq/L — AB (ref 3–11)
AST: 23 U/L (ref 5–34)
Albumin: 3.7 g/dL (ref 3.5–5.0)
Alkaline Phosphatase: 93 U/L (ref 40–150)
BILIRUBIN TOTAL: 0.47 mg/dL (ref 0.20–1.20)
BUN: 15.9 mg/dL (ref 7.0–26.0)
CALCIUM: 9.2 mg/dL (ref 8.4–10.4)
CO2: 25 meq/L (ref 22–29)
CREATININE: 1.4 mg/dL — AB (ref 0.7–1.3)
Chloride: 106 mEq/L (ref 98–109)
EGFR: 59 mL/min/{1.73_m2} — AB (ref >90)
Glucose: 124 mg/dl (ref 70–140)
Potassium: 3.9 mEq/L (ref 3.5–5.1)
SODIUM: 143 meq/L (ref 136–145)
TOTAL PROTEIN: 6.9 g/dL (ref 6.4–8.3)

## 2014-08-10 LAB — CBC WITH DIFFERENTIAL/PLATELET
BASO%: 0.6 % (ref 0.0–2.0)
Basophils Absolute: 0 10*3/uL (ref 0.0–0.1)
EOS ABS: 0.1 10*3/uL (ref 0.0–0.5)
EOS%: 3.4 % (ref 0.0–7.0)
HCT: 40.5 % (ref 38.4–49.9)
HGB: 13.5 g/dL (ref 13.0–17.1)
LYMPH%: 27.2 % (ref 14.0–49.0)
MCH: 31 pg (ref 27.2–33.4)
MCHC: 33.4 g/dL (ref 32.0–36.0)
MCV: 92.6 fL (ref 79.3–98.0)
MONO#: 0.5 10*3/uL (ref 0.1–0.9)
MONO%: 11.2 % (ref 0.0–14.0)
NEUT#: 2.5 10*3/uL (ref 1.5–6.5)
NEUT%: 57.6 % (ref 39.0–75.0)
PLATELETS: 149 10*3/uL (ref 140–400)
RBC: 4.38 10*6/uL (ref 4.20–5.82)
RDW: 15.4 % — AB (ref 11.0–14.6)
WBC: 4.4 10*3/uL (ref 4.0–10.3)
lymph#: 1.2 10*3/uL (ref 0.9–3.3)

## 2014-08-10 MED ORDER — OCTREOTIDE ACETATE 30 MG IM KIT
30.0000 mg | PACK | Freq: Once | INTRAMUSCULAR | Status: AC
  Administered 2014-08-10: 30 mg via INTRAMUSCULAR
  Filled 2014-08-10: qty 1

## 2014-09-05 ENCOUNTER — Ambulatory Visit (INDEPENDENT_AMBULATORY_CARE_PROVIDER_SITE_OTHER): Payer: Medicare Other | Admitting: Pharmacist

## 2014-09-05 DIAGNOSIS — I639 Cerebral infarction, unspecified: Secondary | ICD-10-CM

## 2014-09-05 DIAGNOSIS — I4891 Unspecified atrial fibrillation: Secondary | ICD-10-CM

## 2014-09-05 DIAGNOSIS — Z8673 Personal history of transient ischemic attack (TIA), and cerebral infarction without residual deficits: Secondary | ICD-10-CM

## 2014-09-05 DIAGNOSIS — Z7901 Long term (current) use of anticoagulants: Secondary | ICD-10-CM | POA: Diagnosis not present

## 2014-09-05 LAB — POCT INR: INR: 2.2

## 2014-09-05 NOTE — Patient Instructions (Signed)
Patient instructed to take medications as defined in the Anti-coagulation Track section of this encounter.  Patient instructed to take today's dose.  Patient verbalized understanding of these instructions.    

## 2014-09-05 NOTE — Progress Notes (Signed)
Anti-Coagulation Progress Note  Jonathan Reed is a 69 y.o. male who is currently on an anti-coagulation regimen.    RECENT RESULTS: Recent results are below, the most recent result is correlated with a dose of 30 mg. per week: Lab Results  Component Value Date   INR 2.20 09/05/2014   INR 3.60 07/25/2014   INR 2.60 06/27/2014   PROTIME 30.0* 05/15/2012    ANTI-COAG DOSE: Anticoagulation Dose Instructions as of 09/05/2014      [name] Tue Wed Thu Fri Sat   New Dose 5 mg 5 mg 5 mg 2.5 mg 5 mg 5 mg 5 mg       ANTICOAG SUMMARY: Anticoagulation Episode Summary    Current INR goal 2.0-3.0  Next INR check 10/03/2014  INR from last check 2.20 (09/05/2014)  Weekly max dose   Target end date Indefinite  [facility]  Preferred lab   Send INR reminders to    Indications  Long term (current) use of anticoagulants [Z79.01] CVA (cerebral infarction) [I63.9] A-fib [I48.91]        Comments         ANTICOAG TODAY: Anticoagulation Summary as of 09/05/2014    INR goal 2.0-3.0  Selected INR 2.20 (09/05/2014)  Next INR check 10/03/2014  Target end date Indefinite   Indications  Long term (current) use of anticoagulants [Z79.01] CVA (cerebral infarction) [I63.9] A-fib [I48.91]      Anticoagulation Episode Summary    [facility] check [facility]   Preferred lab    Send INR reminders to    Comments       PATIENT INSTRUCTIONS: Patient Instructions  Patient instructed to take medications as defined in the Anti-coagulation Track section of this encounter.  Patient instructed to take today's dose.  Patient verbalized understanding of these instructions.       FOLLOW-UP Return in 4 weeks (on 10/03/2014) for Follow up INR at 0915h.  [name], III Pharm.D., CACP

## 2014-09-06 NOTE — Progress Notes (Signed)
INTERNAL MEDICINE TEACHING ATTENDING ADDENDUM - Madolyn Ackroyd M.D  Duration- indefinite, Indication- afib, INR- therapeutic. Agree with pharmacy recommendations as outlined in their note.     

## 2014-09-07 ENCOUNTER — Ambulatory Visit (HOSPITAL_BASED_OUTPATIENT_CLINIC_OR_DEPARTMENT_OTHER): Payer: Medicare Other

## 2014-09-07 ENCOUNTER — Encounter: Payer: Self-pay | Admitting: Hematology and Oncology

## 2014-09-07 ENCOUNTER — Telehealth: Payer: Self-pay | Admitting: Hematology and Oncology

## 2014-09-07 ENCOUNTER — Ambulatory Visit (HOSPITAL_BASED_OUTPATIENT_CLINIC_OR_DEPARTMENT_OTHER): Payer: Medicare Other | Admitting: Hematology and Oncology

## 2014-09-07 ENCOUNTER — Other Ambulatory Visit (HOSPITAL_BASED_OUTPATIENT_CLINIC_OR_DEPARTMENT_OTHER): Payer: Medicare Other

## 2014-09-07 VITALS — BP 127/90 | HR 82 | Temp 98.0°F | Resp 18 | Ht 70.0 in | Wt 201.8 lb

## 2014-09-07 DIAGNOSIS — I482 Chronic atrial fibrillation, unspecified: Secondary | ICD-10-CM

## 2014-09-07 DIAGNOSIS — C7B02 Secondary carcinoid tumors of liver: Secondary | ICD-10-CM | POA: Diagnosis not present

## 2014-09-07 DIAGNOSIS — C7A00 Malignant carcinoid tumor of unspecified site: Secondary | ICD-10-CM

## 2014-09-07 DIAGNOSIS — C7B8 Other secondary neuroendocrine tumors: Secondary | ICD-10-CM

## 2014-09-07 DIAGNOSIS — N185 Chronic kidney disease, stage 5: Secondary | ICD-10-CM | POA: Diagnosis not present

## 2014-09-07 DIAGNOSIS — C787 Secondary malignant neoplasm of liver and intrahepatic bile duct: Secondary | ICD-10-CM

## 2014-09-07 DIAGNOSIS — N182 Chronic kidney disease, stage 2 (mild): Secondary | ICD-10-CM

## 2014-09-07 LAB — COMPREHENSIVE METABOLIC PANEL (CC13)
ALBUMIN: 3.5 g/dL (ref 3.5–5.0)
ALK PHOS: 84 U/L (ref 40–150)
ALT: 23 U/L (ref 0–55)
AST: 21 U/L (ref 5–34)
Anion Gap: 7 mEq/L (ref 3–11)
BUN: 17.7 mg/dL (ref 7.0–26.0)
CHLORIDE: 106 meq/L (ref 98–109)
CO2: 28 mEq/L (ref 22–29)
Calcium: 9.4 mg/dL (ref 8.4–10.4)
Creatinine: 1.4 mg/dL — ABNORMAL HIGH (ref 0.7–1.3)
EGFR: 60 mL/min/{1.73_m2} — ABNORMAL LOW (ref >90)
Glucose: 137 mg/dl (ref 70–140)
POTASSIUM: 3.7 meq/L (ref 3.5–5.1)
Sodium: 141 mEq/L (ref 136–145)
TOTAL PROTEIN: 6.9 g/dL (ref 6.4–8.3)
Total Bilirubin: 0.59 mg/dL (ref 0.20–1.20)

## 2014-09-07 LAB — CBC WITH DIFFERENTIAL/PLATELET
BASO%: 0.6 % (ref 0.0–2.0)
Basophils Absolute: 0 10*3/uL (ref 0.0–0.1)
EOS%: 1.9 % (ref 0.0–7.0)
Eosinophils Absolute: 0.1 10*3/uL (ref 0.0–0.5)
HCT: 41.1 % (ref 38.4–49.9)
HGB: 13.7 g/dL (ref 13.0–17.1)
LYMPH%: 16.7 % (ref 14.0–49.0)
MCH: 31.3 pg (ref 27.2–33.4)
MCHC: 33.2 g/dL (ref 32.0–36.0)
MCV: 94.2 fL (ref 79.3–98.0)
MONO#: 0.8 10*3/uL (ref 0.1–0.9)
MONO%: 12.2 % (ref 0.0–14.0)
NEUT%: 68.6 % (ref 39.0–75.0)
NEUTROS ABS: 4.3 10*3/uL (ref 1.5–6.5)
PLATELETS: 146 10*3/uL (ref 140–400)
RBC: 4.36 10*6/uL (ref 4.20–5.82)
RDW: 15.8 % — AB (ref 11.0–14.6)
WBC: 6.3 10*3/uL (ref 4.0–10.3)
lymph#: 1 10*3/uL (ref 0.9–3.3)

## 2014-09-07 MED ORDER — OCTREOTIDE ACETATE 20 MG IM KIT
30.0000 mg | PACK | Freq: Once | INTRAMUSCULAR | Status: AC
  Administered 2014-09-07: 30 mg via INTRAMUSCULAR
  Filled 2014-09-07: qty 2

## 2014-09-07 NOTE — Assessment & Plan Note (Signed)
His kidney function tests is stable. Due to risk of contrast nephropathy, I recommend he hold spironolactone and Zestoretic the day before, the day of and the day after CT scan.

## 2014-09-07 NOTE — Assessment & Plan Note (Signed)
He had history of stroke without long-term neurological deficit. He's doing well on anticoagulation therapy under the care of his internist. He denies bleeding complications.

## 2014-09-07 NOTE — Telephone Encounter (Signed)
per pof to sch pt appt-gave pt avs °

## 2014-09-07 NOTE — Progress Notes (Signed)
[facility] OFFICE PROGRESS NOTE  Patient Care Team: Blain Pais, MD as PCP - General (Internal Medicine) Estill Dooms, MD as Referring Physician (Surgery) Heath Lark, MD as Consulting Physician (Hematology and Oncology)  SUMMARY OF ONCOLOGIC HISTORY:   Metastatic malignant neuroendocrine tumor to liver   04/14/2012 Initial Diagnosis Metastatic malignant neuroendocrine tumor to liver   07/13/2014 -  Chemotherapy He received monthly Octreotide injection   In January of 2014 the patient was hospitalized for shortness of breath. He presented also with some epigastric pain and imaging studies review abnormal liver mass.  On 04/15/2012 he underwent biopsy which showed well differentiated neuroendocrine tumor.  Since February 2014, he was placed on palliative chemotherapy with Xeloda 1000 mg per meter squared twice a day for 2 weeks, off 2 weeks and Temodar 200 mg per meter squared daily on days 10-14, each cycle is a 28 day cycle.  May 2014 CT scan show partial response.  September 2014 repeat imaging study show continued response to treatment. January 2015, repeat imaging study showed mixed response In April 2015, repeat imaging study shows stable disease. After a lot of discussion, the patient is in agreement to stop treatment for chemotherapy holiday In July 2015, repeat imaging studies show mild disease progression. The patient elected for observation. It October 2015, repeat imaging studies showed stable disease. He is observed. On 07/05/2014, repeat CT scan of the abdomen show significant disease progression  INTERVAL HISTORY: Please see below for problem oriented charting. He tolerated the doctor to injection well. He denies side effects from the treatment including bleeding or bruising, nausea, vomiting or diarrhea.  REVIEW OF SYSTEMS:   Constitutional: Denies fevers, chills or abnormal weight loss Eyes: Denies blurriness of vision Ears, nose, mouth, throat, and  face: Denies mucositis or sore throat Respiratory: Denies cough, dyspnea or wheezes Cardiovascular: Denies palpitation, chest discomfort or lower extremity swelling Gastrointestinal:  Denies nausea, heartburn or change in bowel habits Skin: Denies abnormal skin rashes Lymphatics: Denies new lymphadenopathy or easy bruising Neurological:Denies numbness, tingling or new weaknesses Behavioral/Psych: Mood is stable, no new changes  All other systems were reviewed with the patient and are negative.  I have reviewed the past medical history, past surgical history, social history and family history with the patient and they are unchanged from previous note.  ALLERGIES:  has No Known Allergies.  MEDICATIONS:  Current Outpatient Prescriptions  Medication Sig Dispense Refill  . lisinopril-hydrochlorothiazide (PRINZIDE,ZESTORETIC) 20-25 MG per tablet Take 1 tablet by mouth daily. 90 tablet 2  . methocarbamol (ROBAXIN) 500 MG tablet Take 1 tablet (500 mg total) by mouth every 8 (eight) hours as needed. 30 tablet 0  . metoprolol succinate (TOPROL-XL) 50 MG 24 hr tablet Take 1 tablet (50 mg total) by mouth daily. Take with or immediately following a meal. 30 tablet 2  . spironolactone (ALDACTONE) 25 MG tablet Take 25 mg by mouth daily.    Marland Kitchen warfarin (COUMADIN) 5 MG tablet Take 1 tablet by mouth daily.  **Patient requests 90 day supply** 90 tablet 3   No current facility-administered medications for this visit.    PHYSICAL EXAMINATION: ECOG PERFORMANCE STATUS: 0 - Asymptomatic  Filed Vitals:   09/07/14 0839  BP: 127/90  Pulse: 82  Temp: 98 F (36.7 C)  Resp: 18   Filed Weights   09/07/14 0839  Weight: 201 lb 12.8 oz (91.536 kg)    GENERAL:alert, no distress and comfortable SKIN: skin color, texture, turgor are normal, no rashes or significant  lesions EYES: normal, Conjunctiva are pink and non-injected, sclera clear OROPHARYNX:no exudate, no erythema and lips, buccal mucosa, and tongue  normal  NECK: supple, thyroid normal size, non-tender, without nodularity LYMPH:  no palpable lymphadenopathy in the cervical, axillary or inguinal LUNGS: clear to auscultation and percussion with normal breathing effort HEART: regular rate & rhythm and no murmurs and no lower extremity edema ABDOMEN:abdomen soft, non-tender and normal bowel sounds Musculoskeletal:no cyanosis of digits and no clubbing  NEURO: alert & oriented x 3 with fluent speech, no focal motor/sensory deficits  LABORATORY DATA:  I have reviewed the data as listed    Component Value Date/Time   NA 141 09/07/2014 0811   NA 138 06/28/2013 1014   K 3.7 09/07/2014 0811   K 4.1 06/28/2013 1014   CL 103 06/28/2013 1014   CL 105 08/24/2012 0849   CO2 28 09/07/2014 0811   CO2 21 06/28/2013 1014   GLUCOSE 137 09/07/2014 0811   GLUCOSE 86 06/28/2013 1014   GLUCOSE 100* 08/24/2012 0849   BUN 17.7 09/07/2014 0811   BUN 19 06/28/2013 1014   CREATININE 1.4* 09/07/2014 0811   CREATININE 1.43* 06/28/2013 1014   CREATININE 1.09 04/16/2012 1016   CALCIUM 9.4 09/07/2014 0811   CALCIUM 10.2 06/28/2013 1014   PROT 6.9 09/07/2014 0811   PROT 7.5 06/28/2013 1014   ALBUMIN 3.5 09/07/2014 0811   ALBUMIN 4.5 06/28/2013 1014   AST 21 09/07/2014 0811   AST 17 06/28/2013 1014   ALT 23 09/07/2014 0811   ALT 15 06/28/2013 1014   ALKPHOS 84 09/07/2014 0811   ALKPHOS 60 06/28/2013 1014   BILITOT 0.59 09/07/2014 0811   BILITOT 0.6 06/28/2013 1014   GFRNONAA 51* 06/28/2013 1014   GFRNONAA 70* 04/16/2012 1016   GFRAA 59* 06/28/2013 1014   GFRAA 81* 04/16/2012 1016    No results found for: SPEP, UPEP  Lab Results  Component Value Date   WBC 6.3 09/07/2014   NEUTROABS 4.3 09/07/2014   HGB 13.7 09/07/2014   HCT 41.1 09/07/2014   MCV 94.2 09/07/2014   PLT 146 09/07/2014      Chemistry      Component Value Date/Time   NA 141 09/07/2014 0811   NA 138 06/28/2013 1014   K 3.7 09/07/2014 0811   K 4.1 06/28/2013 1014   CL  103 06/28/2013 1014   CL 105 08/24/2012 0849   CO2 28 09/07/2014 0811   CO2 21 06/28/2013 1014   BUN 17.7 09/07/2014 0811   BUN 19 06/28/2013 1014   CREATININE 1.4* 09/07/2014 0811   CREATININE 1.43* 06/28/2013 1014   CREATININE 1.09 04/16/2012 1016      Component Value Date/Time   CALCIUM 9.4 09/07/2014 0811   CALCIUM 10.2 06/28/2013 1014   ALKPHOS 84 09/07/2014 0811   ALKPHOS 60 06/28/2013 1014   AST 21 09/07/2014 0811   AST 17 06/28/2013 1014   ALT 23 09/07/2014 0811   ALT 15 06/28/2013 1014   BILITOT 0.59 09/07/2014 0811   BILITOT 0.6 06/28/2013 1014      ASSESSMENT & PLAN:  Metastatic malignant neuroendocrine tumor to liver He tolerated treatment well without any side effects from the octreotide injection. I will continue the same. Next month, before I see him for his fourth treatment, I will order CT scan of the abdomen and pelvis with contrast and blood work to assess response to treatment.  CKD (chronic kidney disease), stage II His kidney function tests is stable. Due to risk of  contrast nephropathy, I recommend he hold spironolactone and Zestoretic the day before, the day of and the day after CT scan.  Chronic atrial fibrillation He had history of stroke without long-term neurological deficit. He's doing well on anticoagulation therapy under the care of his internist. He denies bleeding complications.     Orders Placed This Encounter  Procedures  . CT Abdomen Pelvis W Contrast    Standing Status: Future     Number of Occurrences:      Standing Expiration Date: 12/08/2015    Order Specific Question:  Reason for Exam (SYMPTOM  OR DIAGNOSIS REQUIRED)    Answer:  staging metastatic neuroendocrine tumor to liver, assess response to Rx    Order Specific Question:  Preferred imaging location?    Answer:  [facility]   All questions were answered. The patient knows to call the clinic with any problems, questions or concerns. No barriers to learning was  detected. I spent 25 minutes counseling the patient face to face. The total time spent in the appointment was 30 minutes and more than 50% was on counseling and review of test results     [facility], [place], MD 09/07/2014 9:06 AM

## 2014-09-07 NOTE — Assessment & Plan Note (Signed)
He tolerated treatment well without any side effects from the octreotide injection. I will continue the same. Next month, before I see him for his fourth treatment, I will order CT scan of the abdomen and pelvis with contrast and blood work to assess response to treatment.

## 2014-09-18 ENCOUNTER — Telehealth: Payer: Self-pay | Admitting: Radiology

## 2014-09-18 ENCOUNTER — Ambulatory Visit (INDEPENDENT_AMBULATORY_CARE_PROVIDER_SITE_OTHER): Payer: Medicare Other | Admitting: Urgent Care

## 2014-09-18 ENCOUNTER — Ambulatory Visit (HOSPITAL_BASED_OUTPATIENT_CLINIC_OR_DEPARTMENT_OTHER)
Admission: RE | Admit: 2014-09-18 | Discharge: 2014-09-18 | Disposition: A | Discharge status: Home / Self Care | Payer: Medicare Other | Source: Ambulatory Visit | Attending: Urgent Care | Admitting: Urgent Care

## 2014-09-18 VITALS — BP 108/72 | HR 47 | Temp 97.9°F | Resp 16 | Ht 70.0 in | Wt 194.0 lb

## 2014-09-18 DIAGNOSIS — R1011 Right upper quadrant pain: Secondary | ICD-10-CM

## 2014-09-18 DIAGNOSIS — D649 Anemia, unspecified: Secondary | ICD-10-CM | POA: Diagnosis not present

## 2014-09-18 DIAGNOSIS — C7B8 Other secondary neuroendocrine tumors: Secondary | ICD-10-CM

## 2014-09-18 DIAGNOSIS — C787 Secondary malignant neoplasm of liver and intrahepatic bile duct: Secondary | ICD-10-CM | POA: Insufficient documentation

## 2014-09-18 DIAGNOSIS — I482 Chronic atrial fibrillation, unspecified: Secondary | ICD-10-CM

## 2014-09-18 LAB — COMPREHENSIVE METABOLIC PANEL
ALT: 16 U/L (ref 0–53)
AST: 14 U/L (ref 0–37)
Albumin: 3.9 g/dL (ref 3.5–5.2)
Alkaline Phosphatase: 136 U/L — ABNORMAL HIGH (ref 39–117)
BILIRUBIN TOTAL: 0.6 mg/dL (ref 0.2–1.2)
BUN: 22 mg/dL (ref 6–23)
CHLORIDE: 98 meq/L (ref 96–112)
CO2: 24 mEq/L (ref 19–32)
CREATININE: 1.45 mg/dL — AB (ref 0.50–1.35)
Calcium: 9 mg/dL (ref 8.4–10.5)
Glucose, Bld: 115 mg/dL — ABNORMAL HIGH (ref 70–99)
Potassium: 4 mEq/L (ref 3.5–5.3)
Sodium: 136 mEq/L (ref 135–145)
TOTAL PROTEIN: 7 g/dL (ref 6.0–8.3)

## 2014-09-18 LAB — POCT CBC
Granulocyte percent: 81.6 %G — AB (ref 37–80)
HCT, POC: 42.6 % — AB (ref 43.5–53.7)
Hemoglobin: 13.9 g/dL — AB (ref 14.1–18.1)
Lymph, poc: 1.6 (ref 0.6–3.4)
MCH, POC: 30 pg (ref 27–31.2)
MCHC: 32.7 g/dL (ref 31.8–35.4)
MCV: 91.6 fL (ref 80–97)
MID (cbc): 0.3 (ref 0–0.9)
MPV: 6.9 fL (ref 0–99.8)
POC GRANULOCYTE: 8.5 — AB (ref 2–6.9)
POC LYMPH %: 15.1 % (ref 10–50)
POC MID %: 3.3 % (ref 0–12)
Platelet Count, POC: 375 10*3/uL (ref 142–424)
RBC: 4.65 M/uL — AB (ref 4.69–6.13)
RDW, POC: 14.7 %
WBC: 10.4 10*3/uL — AB (ref 4.6–10.2)

## 2014-09-18 MED ORDER — OXYCODONE HCL 5 MG PO TABS: 5.0000 mg | ORAL_TABLET | Freq: Four times a day (QID) | ORAL | Status: DC | PRN

## 2014-09-18 MED ORDER — HYDROCODONE-ACETAMINOPHEN 7.5-325 MG PO TABS
1.0000 | ORAL_TABLET | Freq: Four times a day (QID) | ORAL | Status: DC | PRN
Start: 2014-09-18 — End: 2014-09-18

## 2014-09-18 NOTE — Progress Notes (Addendum)
MRN: [id number] DOB: 1945-04-13  Subjective:   Jonathan Reed is a 69 y.o. male presenting for chief complaint of Chest Pain and Shortness of Breath  Reports 3 day history of worsening right upper quadrant pain, pain is sharp in nature, initially intermittent but now more constant, nonradiating. Has tried medication for gas relief without any significant differences in his symptoms. Denies fevers, nausea, vomiting, chest pain, shortness of breath, heart racing, palpitations, diaphoresis, cough, limb pain, back pain, jaw pain, neck pain, constipation, bloody stool, dysuria, flank pain, hematuria. Of note, patient is being treated for neuroendocrine tumor of his liver with metastasis. Patient receives monthly injections of octreotide, is followed by [facility], Dr. Alvy Bimler. Patient was recently seen for followup on 09/07/2014, labs were all normal including Cmet and CBC. He also has history of atrial fibrillation, currently on anticoagulation with warfarin, last INR was 2.3. Denies any other aggravating or relieving factors, no other questions or concerns.  Jonathan Reed has a current medication list which includes the following prescription(s): lisinopril-hydrochlorothiazide, metoprolol succinate, spironolactone, and warfarin. He has No Known Allergies.  Jonathan Reed  has a past medical history of CHF (congestive heart failure); CVA (cerebral infarction) (2011); CAD (coronary artery disease) (2000); HTN (hypertension); Nausea alone (01/06/2013); Abdominal pain, unspecified site (01/06/2013); Stroke; Carcinoid syndrome; Metastatic malignant neuroendocrine tumor to liver; Metastasis to liver (10/06/2013); and Tooth ache (01/04/2014). Also  has past surgical history that includes Cardiac defibrillator placement; Inguinal hernia repair (Bilateral); Sinus surgery with Instatrak; and Colonoscopy (1980).  ROS As in subjective.  Objective:   Vitals: BP 108/72 mmHg  Pulse 47  Temp(Src) 97.9 F (36.6 C)   Resp 16  Ht 5\' 10"  (1.778 m)  Wt 194 lb (87.998 kg)  BMI 27.84 kg/m2  SpO2 95%  Physical Exam  Constitutional: He is oriented to person, place, and time. He appears well-developed and well-nourished.  HENT:  Mouth/Throat: Oropharynx is clear and moist.  Eyes: Conjunctivae are normal. Right eye exhibits no discharge. Left eye exhibits no discharge. No scleral icterus.  Cardiovascular: Normal rate, regular rhythm and intact distal pulses.  Exam reveals no gallop and no friction rub.   No murmur heard. Pulmonary/Chest: No stridor. No respiratory distress. He has no wheezes. He has no rales.  Abdominal: Soft. Bowel sounds are normal. He exhibits distension (slightly distended). He exhibits no mass. There is tenderness (RUQ).  No ecchymosis.  Lymphadenopathy:    He has no cervical adenopathy.  Neurological: He is alert and oriented to person, place, and time.  Skin: Skin is warm and dry. No rash noted. No erythema. No pallor.   Results for orders placed or performed in visit on 09/18/14 (from the past 48 hour(s))  POCT CBC     Status: Abnormal   Collection Time: 09/18/14  8:56 AM  Result Value Ref Range   WBC 10.4 (A) 4.6 - 10.2 K/uL   Lymph, poc 1.6 0.6 - 3.4   POC LYMPH PERCENT 15.1 10 - 50 %L   MID (cbc) 0.3 0 - 0.9   POC MID % 3.3 0 - 12 %M   POC Granulocyte 8.5 (A) 2 - 6.9   Granulocyte percent 81.6 (A) 37 - 80 %G   RBC 4.65 (A) 4.69 - 6.13 M/uL   Hemoglobin 13.9 (A) 14.1 - 18.1 g/dL   HCT, POC 42.6 (A) 43.5 - 53.7 %   MCV 91.6 80 - 97 fL   MCH, POC 30.0 27 - 31.2 pg   MCHC 32.7 31.8 -  35.4 g/dL   RDW, POC 14.7 %   Platelet Count, POC 375 142 - 424 K/uL   MPV 6.9 0 - 99.8 fL  Comprehensive metabolic panel     Status: Abnormal   Collection Time: 09/18/14  9:52 AM  Result Value Ref Range   Sodium 136 135 - 145 mEq/L   Potassium 4.0 3.5 - 5.3 mEq/L   Chloride 98 96 - 112 mEq/L   CO2 24 19 - 32 mEq/L   Glucose, Bld 115 (H) 70 - 99 mg/dL   BUN 22 6 - 23 mg/dL   Creat 1.45  (H) 0.50 - 1.35 mg/dL   Total Bilirubin 0.6 0.2 - 1.2 mg/dL   Alkaline Phosphatase 136 (H) 39 - 117 U/L   AST 14 0 - 37 U/L   ALT 16 0 - 53 U/L   Total Protein 7.0 6.0 - 8.3 g/dL   Albumin 3.9 3.5 - 5.2 g/dL   Calcium 9.0 8.4 - 10.5 mg/dL   US Abdomen Complete  09/18/2014   CLINICAL DATA:  RIGHT upper quadrant pain for 3 days. Metastatic neuroendocrine tumor to the liver.  EXAM: ULTRASOUND ABDOMEN COMPLETE  COMPARISON:  None.  FINDINGS: Gallbladder: Biliary sludge. No sonographic Murphy sign, wall thickening or pericholecystic fluid. No stones.  Common bile duct: Diameter: 2 mm.  Liver: Diffuse heterogeneous echotexture of the liver compatible with hepatic mid passed the sees identified on prior CT. No intrahepatic biliary ductal dilation identified in the background of metastatic disease.  IVC: No abnormality visualized.  Pancreas: Obscured by bowel gas.  Spleen: Obscured by bowel gas.  Right Kidney: Length: 10.6 cm. Echogenicity within normal limits. No mass or hydronephrosis visualized.  Left Kidney: Length: 10.3 cm. Echogenicity within normal limits. No mass or hydronephrosis visualized.  Abdominal aorta: No aneurysm visualized.  Other findings: None.  IMPRESSION: 1. Diffuse hepatic metastases. 2. Echogenic biliary sludge. 3. No acute abnormality.   Electronically Signed   By: Dereck Ligas M.D.   On: 09/18/2014 10:49   Assessment and Plan :   1. RUQ abdominal pain 2. Metastatic malignant neuroendocrine tumor to liver - Labs pending, sent patient for ultrasound today to further evaluate his right upper quadrant pain. Unclear whether his right upper quadrant pain is due to his cancer or possible obstruction, cholecystitis given his CBC changes (elevated wbc, left shift) from last week. Counseled patient on differential and we'll try to get in touch with his oncologist to see what he liked to do.  3. Chronic atrial fibrillation - INR within therapeutic range, continue anticoagulation and  followup with oncology  4. Anemia - Vitals stable, PE findings reassuring, follow up with oncologist  Jaynee Eagles, PA-C [facility] [phone] 09/18/2014 8:22 AM    UPDATE: Reviewed case with Dr. Laney Pastor, U/S findings show biliary sludge only, negative for acute cholecystitis. This together with this vitals, PE findings, current symptoms will decide to manage patient's pain until he can see his oncologist. Counseled patient and his daughter on differential, educated on worrisome symptoms of developing cholecystitis, patient is to rtc or report to ED if these develop. Patient agreed with plan of holding off on more invasive testing, follow up with oncologist on 09/20/2014, oxycodone 5mg  IR every 6 hours PRN.  Jaynee Eagles, PA-C [facility] [phone] 09/18/2014  12:58 PM    UPDATE: Increase in alkaline phosphatase suggests gall bladder dysfunction. I will forward results to Dr. Alvy Bimler  and report results to patient tomorrow morning, 09/20/2014. Per patient's family, patient was not a surgical candidate; however, patient may need HIDA scan with ejection fraction. Also consider Ursodiol as non-surgical management.   Jaynee Eagles, PA-C [facility] [phone] 09/19/2014  7:36 PM

## 2014-09-18 NOTE — Patient Instructions (Addendum)
Cholecystitis Cholecystitis is an inflammation of your gallbladder. It is usually caused by a buildup of gallstones or sludge (cholelithiasis) in your gallbladder. The gallbladder stores a fluid that helps digest fats (bile). Cholecystitis is serious and needs treatment right away.  CAUSES   Gallstones. Gallstones can block the tube that leads to your gallbladder, causing bile to build up. As bile builds up, the gallbladder becomes inflamed.  Bile duct problems, such as blockage from scarring or kinking.  Tumors. Tumors can stop bile from leaving your gallbladder correctly, causing bile to build up. As bile builds up, the gallbladder becomes inflamed. SYMPTOMS   Nausea.  Vomiting.  Abdominal pain, especially in the upper right area of your abdomen.  Abdominal tenderness or bloating.  Sweating.  Chills.  Fever.  Yellowing of the skin and the whites of the eyes (jaundice). DIAGNOSIS  Your caregiver may order blood tests to look for infection or gallbladder problems. Your caregiver may also order imaging tests, such as an ultrasound or computed tomography (CT) scan. Further tests may include a hepatobiliary iminodiacetic acid (HIDA) scan. This scan allows your caregiver to see your bile move from the liver to the gallbladder and to the small intestine. TREATMENT  A hospital stay is usually necessary to lessen the inflammation of your gallbladder. You may be required to not eat or drink (fast) for a certain amount of time. You may be given medicine to treat pain or an antibiotic medicine to treat an infection. Surgery may be needed to remove your gallbladder (cholecystectomy) once the inflammation has gone down. Surgery may be needed right away if you develop complications such as death of gallbladder tissue (gangrene) or a tear (perforation) of the gallbladder.  [place] care will depend on your treatment. In general:  If you were given antibiotics, take them as  directed. Finish them even if you start to feel better.  Only take over-the-counter or prescription medicines for pain, discomfort, or fever as directed by your caregiver.  Follow a low-fat diet until you see your caregiver again.  Keep all follow-up visits as directed by your caregiver. SEEK IMMEDIATE MEDICAL CARE IF:   Your pain is increasing and not controlled by medicines.  Your pain moves to another part of your abdomen or to your back.  You have a fever.  You have nausea and vomiting. MAKE SURE YOU:  Understand these instructions.  Will watch your condition.  Will get help right away if you are not doing well or get worse. Document Released: 03/04/2005 Document Revised: 05/27/2011 Document Reviewed: 01/18/2011 [facility] Patient Information 2015 [place], [place]. This information is not intended to replace advice given to you by your health care provider. Make sure you discuss any questions you have with your health care provider.   Abdominal Pain Many things can cause abdominal pain. Usually, abdominal pain is not caused by a disease and will improve without treatment. It can often be observed and treated at home. Your health care provider will do a physical exam and possibly order blood tests and X-rays to help determine the seriousness of your pain. However, in many cases, more time must pass before a clear cause of the pain can be found. Before that point, your health care provider may not know if you need more testing or further treatment. HOME CARE INSTRUCTIONS  Monitor your abdominal pain for any changes. The following actions may help to alleviate any discomfort you are experiencing:  Only take over-the-counter or prescription medicines  as directed by your health care provider.  Do not take laxatives unless directed to do so by your health care provider.  Try a clear liquid diet (broth, tea, or water) as directed by your health care provider. Slowly move to a bland diet  as tolerated. SEEK MEDICAL CARE IF:  You have unexplained abdominal pain.  You have abdominal pain associated with nausea or diarrhea.  You have pain when you urinate or have a bowel movement.  You experience abdominal pain that wakes you in the night.  You have abdominal pain that is worsened or improved by eating food.  You have abdominal pain that is worsened with eating fatty foods.  You have a fever. SEEK IMMEDIATE MEDICAL CARE IF:   Your pain does not go away within 2 hours.  You keep throwing up (vomiting).  Your pain is felt only in portions of the abdomen, such as the right side or the left lower portion of the abdomen.  You pass bloody or black tarry stools. MAKE SURE YOU:  Understand these instructions.   Will watch your condition.   Will get help right away if you are not doing well or get worse.  Document Released: 12/12/2004 Document Revised: 03/09/2013 Document Reviewed: 11/11/2012 [facility] Patient Information 2015 [place], [place]. This information is not intended to replace advice given to you by your health care provider. Make sure you discuss any questions you have with your health care provider.

## 2014-09-18 NOTE — Telephone Encounter (Signed)
Call report from Radiology for patients [place] report/ have given call to [facility] PA for further, patient is still at imaging center.

## 2014-09-20 ENCOUNTER — Telehealth: Payer: Self-pay | Admitting: *Deleted

## 2014-09-20 ENCOUNTER — Other Ambulatory Visit: Payer: Self-pay | Admitting: *Deleted

## 2014-09-20 ENCOUNTER — Ambulatory Visit (HOSPITAL_BASED_OUTPATIENT_CLINIC_OR_DEPARTMENT_OTHER): Payer: Medicare Other | Admitting: Hematology and Oncology

## 2014-09-20 ENCOUNTER — Encounter: Payer: Self-pay | Admitting: Hematology and Oncology

## 2014-09-20 ENCOUNTER — Telehealth: Payer: Self-pay | Admitting: Urgent Care

## 2014-09-20 VITALS — BP 139/86 | HR 58 | Temp 98.1°F | Ht 70.0 in | Wt 193.3 lb

## 2014-09-20 DIAGNOSIS — C7B02 Secondary carcinoid tumors of liver: Secondary | ICD-10-CM | POA: Diagnosis not present

## 2014-09-20 DIAGNOSIS — C7A00 Malignant carcinoid tumor of unspecified site: Secondary | ICD-10-CM | POA: Diagnosis present

## 2014-09-20 DIAGNOSIS — N50811 Right testicular pain: Secondary | ICD-10-CM | POA: Insufficient documentation

## 2014-09-20 NOTE — Assessment & Plan Note (Signed)
He tolerated treatment well without any side effects from the octreotide injection. Unfortunately, he developed an acute right upper quadrant pain. Examination did not suggest any acute cholecystitis, confirmed on recent ultrasound. I will try to more for his CT scan appointment to be done sooner than in 2 weeks' time and I'll see him back for further discussion. If CT scan showed disease progression, I might have to switch his treatment from octreotide to something else.

## 2014-09-20 NOTE — Progress Notes (Signed)
[place] OFFICE PROGRESS NOTE  Patient Care Team: Norval Gable, MD as PCP - General Estill Dooms, MD as Referring Physician (Surgery) Heath Lark, MD as Consulting Physician (Hematology and Oncology)  SUMMARY OF ONCOLOGIC HISTORY:   Metastatic malignant neuroendocrine tumor to liver   04/14/2012 Initial Diagnosis Metastatic malignant neuroendocrine tumor to liver   07/13/2014 -  Chemotherapy He received monthly Octreotide injection   09/18/2014 Imaging US showed diffuse hepatic metastases, echogenic biliary sludge.    In January of 2014 the patient was hospitalized for shortness of breath. He presented also with some epigastric pain and imaging studies review abnormal liver mass.  On 04/15/2012 he underwent biopsy which showed well differentiated neuroendocrine tumor.  Since February 2014, he was placed on palliative chemotherapy with Xeloda 1000 mg per meter squared twice a day for 2 weeks, off 2 weeks and Temodar 200 mg per meter squared daily on days 10-14, each cycle is a 28 day cycle.  May 2014 CT scan show partial response.  September 2014 repeat imaging study show continued response to treatment. January 2015, repeat imaging study showed mixed response In April 2015, repeat imaging study shows stable disease. After a lot of discussion, the patient is in agreement to stop treatment for chemotherapy holiday In July 2015, repeat imaging studies show mild disease progression. The patient elected for observation. It October 2015, repeat imaging studies showed stable disease. He is observed.  INTERVAL HISTORY: Please see below for problem oriented charting. He was seen at an urgent care area over the weekend for severe right upper quadrant pain, worse on deep inspiration. He was prescribed oxycodone but has not taken it. He rated his pain a 7 out of 10 pain with no radiation. It was not associated with any nausea or vomiting. He had mild constipation over the weekend  result of lack status. He felt that the pain was aggravated by certain food and positional change when he lie on the right side.  REVIEW OF SYSTEMS:   Constitutional: Denies fevers, chills or abnormal weight loss Eyes: Denies blurriness of vision Ears, nose, mouth, throat, and face: Denies mucositis or sore throat Respiratory: Denies cough, dyspnea or wheezes Cardiovascular: Denies palpitation, chest discomfort or lower extremity swelling Skin: Denies abnormal skin rashes Lymphatics: Denies new lymphadenopathy or easy bruising Neurological:Denies numbness, tingling or new weaknesses Behavioral/Psych: Mood is stable, no new changes  All other systems were reviewed with the patient and are negative.  I have reviewed the past medical history, past surgical history, social history and family history with the patient and they are unchanged from previous note.  ALLERGIES:  has No Known Allergies.  MEDICATIONS:  Current Outpatient Prescriptions  Medication Sig Dispense Refill  . lisinopril-hydrochlorothiazide (PRINZIDE,ZESTORETIC) 20-25 MG per tablet Take 1 tablet by mouth daily. 90 tablet 2  . metoprolol succinate (TOPROL-XL) 50 MG 24 hr tablet Take 1 tablet (50 mg total) by mouth daily. Take with or immediately following a meal. 30 tablet 2  . oxyCODONE (OXY IR/ROXICODONE) 5 MG immediate release tablet Take 1 tablet (5 mg total) by mouth every 6 (six) hours as needed for moderate pain or severe pain. 30 tablet 0  . spironolactone (ALDACTONE) 25 MG tablet Take 25 mg by mouth daily.    Marland Kitchen warfarin (COUMADIN) 5 MG tablet Take 1 tablet by mouth daily.  **Patient requests 90 day supply** 90 tablet 3   No current facility-administered medications for this visit.    PHYSICAL EXAMINATION: ECOG PERFORMANCE STATUS: 1 -  Symptomatic but completely ambulatory  Filed Vitals:   09/20/14 0928  BP: 139/86  Pulse: 58  Temp: 98.1 F (36.7 C)   Filed Weights   09/20/14 0928  Weight: 193 lb 4.8 oz  (87.68 kg)    GENERAL:alert, no distress and comfortable SKIN: skin color, texture, turgor are normal, no rashes or significant lesions EYES: normal, Conjunctiva are pink and non-injected, sclera clear OROPHARYNX:no exudate, no erythema and lips, buccal mucosa, and tongue normal  NECK: supple, thyroid normal size, non-tender, without nodularity LYMPH:  no palpable lymphadenopathy in the cervical, axillary or inguinal LUNGS: clear to auscultation and percussion with normal breathing effort HEART: regular rate & rhythm and no murmurs and no lower extremity edema ABDOMEN:abdomen soft, non-tender and normal bowel sounds. There is no rebound or guarding. There is mild tenderness in the right upper quadrant region with deep palpation Musculoskeletal:no cyanosis of digits and no clubbing  NEURO: alert & oriented x 3 with fluent speech, no focal motor/sensory deficits  LABORATORY DATA:  I have reviewed the data as listed    Component Value Date/Time   NA 136 09/18/2014 0952   NA 141 09/07/2014 0811   K 4.0 09/18/2014 0952   K 3.7 09/07/2014 0811   CL 98 09/18/2014 0952   CL 105 08/24/2012 0849   CO2 24 09/18/2014 0952   CO2 28 09/07/2014 0811   GLUCOSE 115* 09/18/2014 0952   GLUCOSE 137 09/07/2014 0811   GLUCOSE 100* 08/24/2012 0849   BUN 22 09/18/2014 0952   BUN 17.7 09/07/2014 0811   CREATININE 1.45* 09/18/2014 0952   CREATININE 1.4* 09/07/2014 0811   CREATININE 1.09 04/16/2012 1016   CALCIUM 9.0 09/18/2014 0952   CALCIUM 9.4 09/07/2014 0811   PROT 7.0 09/18/2014 0952   PROT 6.9 09/07/2014 0811   ALBUMIN 3.9 09/18/2014 0952   ALBUMIN 3.5 09/07/2014 0811   AST 14 09/18/2014 0952   AST 21 09/07/2014 0811   ALT 16 09/18/2014 0952   ALT 23 09/07/2014 0811   ALKPHOS 136* 09/18/2014 0952   ALKPHOS 84 09/07/2014 0811   BILITOT 0.6 09/18/2014 0952   BILITOT 0.59 09/07/2014 0811   GFRNONAA 51* 06/28/2013 1014   GFRNONAA 70* 04/16/2012 1016   GFRAA 59* 06/28/2013 1014   GFRAA 81*  04/16/2012 1016    No results found for: SPEP, UPEP  Lab Results  Component Value Date   WBC 10.4* 09/18/2014   NEUTROABS 4.3 09/07/2014   HGB 13.9* 09/18/2014   HCT 42.6* 09/18/2014   MCV 91.6 09/18/2014   PLT 146 09/07/2014      Chemistry      Component Value Date/Time   NA 136 09/18/2014 0952   NA 141 09/07/2014 0811   K 4.0 09/18/2014 0952   K 3.7 09/07/2014 0811   CL 98 09/18/2014 0952   CL 105 08/24/2012 0849   CO2 24 09/18/2014 0952   CO2 28 09/07/2014 0811   BUN 22 09/18/2014 0952   BUN 17.7 09/07/2014 0811   CREATININE 1.45* 09/18/2014 0952   CREATININE 1.4* 09/07/2014 0811   CREATININE 1.09 04/16/2012 1016      Component Value Date/Time   CALCIUM 9.0 09/18/2014 0952   CALCIUM 9.4 09/07/2014 0811   ALKPHOS 136* 09/18/2014 0952   ALKPHOS 84 09/07/2014 0811   AST 14 09/18/2014 0952   AST 21 09/07/2014 0811   ALT 16 09/18/2014 0952   ALT 23 09/07/2014 0811   BILITOT 0.6 09/18/2014 0952   BILITOT 0.59 09/07/2014 2683  RADIOGRAPHIC STUDIES: I have personally reviewed the findings in the report. US Abdomen Complete  09/18/2014   CLINICAL DATA:  RIGHT upper quadrant pain for 3 days. Metastatic neuroendocrine tumor to the liver.  EXAM: ULTRASOUND ABDOMEN COMPLETE  COMPARISON:  None.  FINDINGS: Gallbladder: Biliary sludge. No sonographic Murphy sign, wall thickening or pericholecystic fluid. No stones.  Common bile duct: Diameter: 2 mm.  Liver: Diffuse heterogeneous echotexture of the liver compatible with hepatic mid passed the sees identified on prior CT. No intrahepatic biliary ductal dilation identified in the background of metastatic disease.  IVC: No abnormality visualized.  Pancreas: Obscured by bowel gas.  Spleen: Obscured by bowel gas.  Right Kidney: Length: 10.6 cm. Echogenicity within normal limits. No mass or hydronephrosis visualized.  Left Kidney: Length: 10.3 cm. Echogenicity within normal limits. No mass or hydronephrosis visualized.  Abdominal  aorta: No aneurysm visualized.  Other findings: None.  IMPRESSION: 1. Diffuse hepatic metastases. 2. Echogenic biliary sludge. 3. No acute abnormality.   Electronically Signed   By: Dereck Ligas M.D.   On: 09/18/2014 10:49     ASSESSMENT & PLAN:  Metastatic malignant neuroendocrine tumor to liver He tolerated treatment well without any side effects from the octreotide injection. Unfortunately, he developed an acute right upper quadrant pain. Examination did not suggest any acute cholecystitis, confirmed on recent ultrasound. I will try to more for his CT scan appointment to be done sooner than in 2 weeks' time and I'll see him back for further discussion. If CT scan showed disease progression, I might have to switch his treatment from octreotide to something else.  Right testicular pain He has mild right testicular pain after recent octreotide injection. Examination is benign. As above, I will order a CT scan for evaluation. I recommend he take oxycodone as prescribed for pain as needed.   No orders of the defined types were placed in this encounter.   All questions were answered. The patient knows to call the clinic with any problems, questions or concerns. No barriers to learning was detected. I spent 25 minutes counseling the patient face to face. The total time spent in the appointment was 30 minutes and more than 50% was on counseling and review of test results     [facility], [place], MD 09/20/2014 10:09 AM

## 2014-09-20 NOTE — Telephone Encounter (Signed)
Called Central Scheduling to move up date of CT per Dr Calton Dach request & moved to this thurs 09/22/14 @ 8:30 am.  Informed daughter of time & date.  Message to [place] to f/u on precert.

## 2014-09-20 NOTE — Telephone Encounter (Signed)
Discussed results, elevated alk phos + U/S result of biliary sludge suggesting possible gall bladder dysfunction. Given complications with neuroendocrine tumor of liver, patient was told he is not a good surgical candidate. I forwarded results to his oncologist, offered to help in his management. Patient will likely need non-surgical management for biliary colic.

## 2014-09-20 NOTE — Telephone Encounter (Signed)
Daughter Andee Lineman called and left message requesting to speak to either Dr. Alvy Bimler or desk nurse only.  Tammy stated pt is not feeling well, has abdominal pain.  Pt had gone to Urgent Care on "Sunday and had abdominal US done.  Pt still has abdominal pain.  Tammy would like to discuss with Dr. Gorsuch about her father's pain. Tammy's  Phone     33" [phone].

## 2014-09-20 NOTE — Assessment & Plan Note (Signed)
He has mild right testicular pain after recent octreotide injection. Examination is benign. As above, I will order a CT scan for evaluation. I recommend he take oxycodone as prescribed for pain as needed.

## 2014-09-20 NOTE — Telephone Encounter (Signed)
Tammy called to say Freida Busman had just called her and she would like to have a return call regarding her father. Please call [phone]

## 2014-09-21 NOTE — Telephone Encounter (Signed)
I already spoke with her sister and gave her all the results and plan. I've also forwarded notes to patient's oncologist, Dr. Alvy Bimler and she confirmed receipt. Thank you.

## 2014-09-22 ENCOUNTER — Ambulatory Visit (HOSPITAL_COMMUNITY)
Admission: RE | Admit: 2014-09-22 | Discharge: 2014-09-22 | Disposition: A | Discharge status: Home / Self Care | Payer: Medicare Other | Source: Ambulatory Visit | Attending: Hematology and Oncology | Admitting: Hematology and Oncology

## 2014-09-22 ENCOUNTER — Encounter (HOSPITAL_COMMUNITY): Payer: Self-pay

## 2014-09-22 DIAGNOSIS — C787 Secondary malignant neoplasm of liver and intrahepatic bile duct: Secondary | ICD-10-CM

## 2014-09-22 DIAGNOSIS — R109 Unspecified abdominal pain: Secondary | ICD-10-CM | POA: Insufficient documentation

## 2014-09-22 DIAGNOSIS — C7B8 Other secondary neuroendocrine tumors: Secondary | ICD-10-CM

## 2014-09-22 MED ORDER — IOHEXOL 300 MG/ML  SOLN
100.0000 mL | Freq: Once | INTRAMUSCULAR | Status: AC | PRN
Start: 2014-09-22 — End: 2014-09-22
  Administered 2014-09-22: 100 mL via INTRAVENOUS

## 2014-09-23 ENCOUNTER — Ambulatory Visit (HOSPITAL_BASED_OUTPATIENT_CLINIC_OR_DEPARTMENT_OTHER): Payer: Medicare Other | Admitting: Hematology and Oncology

## 2014-09-23 ENCOUNTER — Encounter: Payer: Self-pay | Admitting: Hematology and Oncology

## 2014-09-23 ENCOUNTER — Telehealth: Payer: Self-pay | Admitting: Hematology and Oncology

## 2014-09-23 VITALS — BP 115/69 | HR 33 | Temp 97.8°F | Resp 17 | Ht 70.0 in | Wt 191.5 lb

## 2014-09-23 DIAGNOSIS — C7A00 Malignant carcinoid tumor of unspecified site: Secondary | ICD-10-CM

## 2014-09-23 DIAGNOSIS — R0781 Pleurodynia: Secondary | ICD-10-CM

## 2014-09-23 DIAGNOSIS — C7B02 Secondary carcinoid tumors of liver: Secondary | ICD-10-CM

## 2014-09-23 DIAGNOSIS — C787 Secondary malignant neoplasm of liver and intrahepatic bile duct: Secondary | ICD-10-CM

## 2014-09-23 DIAGNOSIS — J40 Bronchitis, not specified as acute or chronic: Secondary | ICD-10-CM | POA: Diagnosis not present

## 2014-09-23 DIAGNOSIS — C7B8 Other secondary neuroendocrine tumors: Secondary | ICD-10-CM

## 2014-09-23 HISTORY — DX: Bronchitis, not specified as acute or chronic: J40

## 2014-09-23 MED ORDER — AZITHROMYCIN 500 MG PO TABS: 500.0000 mg | ORAL_TABLET | Freq: Every day | ORAL | Status: DC

## 2014-09-23 NOTE — Progress Notes (Signed)
[place] OFFICE PROGRESS NOTE  Patient Care Team: Norval Gable, MD as PCP - General Estill Dooms, MD as Referring Physician (Surgery) Heath Lark, MD as Consulting Physician (Hematology and Oncology)  SUMMARY OF ONCOLOGIC HISTORY:   Metastatic malignant neuroendocrine tumor to liver   04/14/2012 Initial Diagnosis Metastatic malignant neuroendocrine tumor to liver   07/13/2014 - 09/07/2014 Chemotherapy He received monthly Octreotide injection   09/18/2014 Imaging US showed diffuse hepatic metastases, echogenic biliary sludge.   09/22/2014 Imaging  repeat CT scan of the abdomen and pelvis showed disease progression.    INTERVAL HISTORY: Please see below for problem oriented charting. He returns for further follow-up. He continues to have intermittent right upper quadrant/rib pain. He has very mild reduced appetite with  Mild weight loss.  he denies recent cough, fevers or chills. REVIEW OF SYSTEMS:   Eyes: Denies blurriness of vision Ears, nose, mouth, throat, and face: Denies mucositis or sore throat Respiratory: Denies cough, dyspnea or wheezes Cardiovascular: Denies palpitation, chest discomfort or lower extremity swelling Gastrointestinal:  Denies nausea, heartburn or change in bowel habits Skin: Denies abnormal skin rashes Lymphatics: Denies new lymphadenopathy or easy bruising Neurological:Denies numbness, tingling or new weaknesses Behavioral/Psych: Mood is stable, no new changes  All other systems were reviewed with the patient and are negative.  I have reviewed the past medical history, past surgical history, social history and family history with the patient and they are unchanged from previous note.  ALLERGIES:  has No Known Allergies.  MEDICATIONS:  Current Outpatient Prescriptions  Medication Sig Dispense Refill  . lisinopril-hydrochlorothiazide (PRINZIDE,ZESTORETIC) 20-25 MG per tablet Take 1 tablet by mouth daily. 90 tablet 2  . metoprolol  succinate (TOPROL-XL) 50 MG 24 hr tablet Take 1 tablet (50 mg total) by mouth daily. Take with or immediately following a meal. 30 tablet 2  . oxyCODONE (OXY IR/ROXICODONE) 5 MG immediate release tablet Take 1 tablet (5 mg total) by mouth every 6 (six) hours as needed for moderate pain or severe pain. 30 tablet 0  . spironolactone (ALDACTONE) 25 MG tablet Take 25 mg by mouth daily.    Marland Kitchen warfarin (COUMADIN) 5 MG tablet Take 1 tablet by mouth daily.  **Patient requests 90 day supply** 90 tablet 3  . azithromycin (ZITHROMAX) 500 MG tablet Take 1 tablet (500 mg total) by mouth daily. 3 tablet 0   No current facility-administered medications for this visit.    PHYSICAL EXAMINATION: ECOG PERFORMANCE STATUS: 1 - Symptomatic but completely ambulatory  Filed Vitals:   09/23/14 0826  BP: 115/69  Pulse: 33  Temp: 97.8 F (36.6 C)  Resp: 17   Filed Weights   09/23/14 0826  Weight: 191 lb 8 oz (86.864 kg)    GENERAL:alert, no distress and comfortable SKIN: skin color, texture, turgor are normal, no rashes or significant lesions EYES: normal, Conjunctiva are pink and non-injected, sclera clear Musculoskeletal:no cyanosis of digits and no clubbing  NEURO: alert & oriented x 3 with fluent speech, no focal motor/sensory deficits  LABORATORY DATA:  I have reviewed the data as listed    Component Value Date/Time   NA 136 09/18/2014 0952   NA 141 09/07/2014 0811   K 4.0 09/18/2014 0952   K 3.7 09/07/2014 0811   CL 98 09/18/2014 0952   CL 105 08/24/2012 0849   CO2 24 09/18/2014 0952   CO2 28 09/07/2014 0811   GLUCOSE 115* 09/18/2014 0952   GLUCOSE 137 09/07/2014 0811   GLUCOSE 100* 08/24/2012 9030  BUN 22 09/18/2014 0952   BUN 17.7 09/07/2014 0811   CREATININE 1.45* 09/18/2014 0952   CREATININE 1.4* 09/07/2014 0811   CREATININE 1.09 04/16/2012 1016   CALCIUM 9.0 09/18/2014 0952   CALCIUM 9.4 09/07/2014 0811   PROT 7.0 09/18/2014 0952   PROT 6.9 09/07/2014 0811   ALBUMIN 3.9  09/18/2014 0952   ALBUMIN 3.5 09/07/2014 0811   AST 14 09/18/2014 0952   AST 21 09/07/2014 0811   ALT 16 09/18/2014 0952   ALT 23 09/07/2014 0811   ALKPHOS 136* 09/18/2014 0952   ALKPHOS 84 09/07/2014 0811   BILITOT 0.6 09/18/2014 0952   BILITOT 0.59 09/07/2014 0811   GFRNONAA 51* 06/28/2013 1014   GFRNONAA 70* 04/16/2012 1016   GFRAA 59* 06/28/2013 1014   GFRAA 81* 04/16/2012 1016    No results found for: SPEP, UPEP  Lab Results  Component Value Date   WBC 10.4* 09/18/2014   NEUTROABS 4.3 09/07/2014   HGB 13.9* 09/18/2014   HCT 42.6* 09/18/2014   MCV 91.6 09/18/2014   PLT 146 09/07/2014      Chemistry      Component Value Date/Time   NA 136 09/18/2014 0952   NA 141 09/07/2014 0811   K 4.0 09/18/2014 0952   K 3.7 09/07/2014 0811   CL 98 09/18/2014 0952   CL 105 08/24/2012 0849   CO2 24 09/18/2014 0952   CO2 28 09/07/2014 0811   BUN 22 09/18/2014 0952   BUN 17.7 09/07/2014 0811   CREATININE 1.45* 09/18/2014 0952   CREATININE 1.4* 09/07/2014 0811   CREATININE 1.09 04/16/2012 1016      Component Value Date/Time   CALCIUM 9.0 09/18/2014 0952   CALCIUM 9.4 09/07/2014 0811   ALKPHOS 136* 09/18/2014 0952   ALKPHOS 84 09/07/2014 0811   AST 14 09/18/2014 0952   AST 21 09/07/2014 0811   ALT 16 09/18/2014 0952   ALT 23 09/07/2014 0811   BILITOT 0.6 09/18/2014 0952   BILITOT 0.59 09/07/2014 0811       RADIOGRAPHIC STUDIES: I reviewed the imaging study in great detail with the patient and his family I have personally reviewed the radiological images as listed and agreed with the findings in the report. Ct Abdomen Pelvis W Contrast  09/22/2014   CLINICAL DATA:  Patient with history of endocrine carcinoma metastasis to the liver. Mid abdominal pain. On chemotherapy.  EXAM: CT ABDOMEN AND PELVIS WITH CONTRAST  TECHNIQUE: Multidetector CT imaging of the abdomen and pelvis was performed using the standard protocol following bolus administration of intravenous contrast.   CONTRAST:  167mL OMNIPAQUE IOHEXOL 300 MG/ML  SOLN  COMPARISON:  CT abdomen pelvis 07/05/2014; 01/03/2014  FINDINGS: Lower chest: Partially visualized AICD device. Bandlike consolidation within right lower lobe. Elevation of the right hemidiaphragm.  Hepatobiliary: Interval progression in size and number of hepatic metastasis. Reference lesion within the left hepatic lobe (image 22; series 2) measures 2.2 x 1.8 cm, previously 0.9 x 0.7 cm. Left hepatic lobe lesion (image 23; series 2) measures 1.8 x 1.4 cm, previously 1.0 x 0.7 cm. Hepatic dome mass (image 15; series 2 measures 4.9 x 3.5 cm, previously 4.2 x 2.8 cm. Adjacent larger hepatic dome mass measures 14.3 x 8.3 cm (image 11; series 2), previously 6.5 x 12.3 cm. Gallbladder is unremarkable. No intrahepatic or extrahepatic biliary ductal dilatation.  Pancreas: Unremarkable  Spleen: Multiple calcified granulomata.  Adrenals/Urinary Tract: Unremarkable adrenal glands. Kidneys enhance symmetrically with contrast. Scarring lateral left kidney. No hydronephrosis. Urinary bladder  is decompressed. 3 mm nonobstructing stone inferior pole right kidney.  Stomach/Bowel: Normal appendix. No abnormal bowel wall thickening or evidence for bowel obstruction. No free fluid or free intraperitoneal air. Re- demonstrated 1 cm mass within the terminal ileum, most compatible with carcinoid tumor.  Vascular/Lymphatic: Normal caliber abdominal aorta. No retroperitoneal lymphadenopathy.  Other: Central dystrophic calcifications in the prostate.  Musculoskeletal: No aggressive or acute appearing osseous lesions. Lumbar spine degenerative changes.  IMPRESSION: Interval progression of hepatic metastatic disease.  Bandlike consolidation within the right lower lobe with elevation of the right hemidiaphragm. This may be secondary to atelectasis. Infection or potentially obstructing bronchial lesion are not excluded.   Electronically Signed   By: Lovey Newcomer M.D.   On: 09/22/2014 09:41      ASSESSMENT & PLAN:  Metastatic malignant neuroendocrine tumor to liver  Unfortunately, CT scan showed disease progression. This is likely the source of his pain. He denies significant pain requiring pain medicine. I have a long discussion with the patient and multiple family members today. I reviewed the most current national guidelines regarding the approach for metastatic differentiated neuroendocrine cancer to the liver. I recommend consideration for local ablation therapy by interventional radiologist. If this is not a possibility, I will resume systemic chemotherapy. The risk and benefits as well as side effects of each options were fully discussed with the patient and family and they agree to proceed.  Bronchitis  The base of the lung on the right side show some form of atelectasis. Pneumonia cannot be excluded. He does have a mild leukocytosis recently. I will cover him with 3 days course of azithromycin. Due to interaction with Coumadin, I recommend he reduce Coumadin dose by half a tablet for the next 3 days.   Orders Placed This Encounter  Procedures  . IR Radiologist Eval & Mgmt    Standing Status: Future     Number of Occurrences:      Standing Expiration Date: 11/24/2015    Order Specific Question:  Reason for Exam (SYMPTOM  OR DIAGNOSIS REQUIRED)    Answer:  For hepatic chemoembolization    Order Specific Question:  Preferred Imaging Location?    Answer:  [facility]  . Ambulatory referral to Interventional Radiology    Referral Priority:  Urgent    Referral Type:  Consultation    Referral Reason:  Specialty Services Required    Requested Specialty:  Interventional Radiology    Number of Visits Requested:  1   All questions were answered. The patient knows to call the clinic with any problems, questions or concerns. No barriers to learning was detected. I spent 25 minutes counseling the patient face to face. The total time spent in the appointment was 40  minutes and more than 50% was on counseling and review of test results     [facility], Shaquera Ansley, MD 09/23/2014 10:47 AM

## 2014-09-23 NOTE — Progress Notes (Signed)
I placed fmla forms for daughter on desk of nurse for dr. Alvy Bimler

## 2014-09-23 NOTE — Telephone Encounter (Signed)
per Alvy Bimler to CX appt for 7/20-adv pt IR to call to sch IR appt

## 2014-09-23 NOTE — Assessment & Plan Note (Signed)
Unfortunately, CT scan showed disease progression. This is likely the source of his pain. He denies significant pain requiring pain medicine. I have a long discussion with the patient and multiple family members today. I reviewed the most current national guidelines regarding the approach for metastatic differentiated neuroendocrine cancer to the liver. I recommend consideration for local ablation therapy by interventional radiologist. If this is not a possibility, I will resume systemic chemotherapy. The risk and benefits as well as side effects of each options were fully discussed with the patient and family and they agree to proceed.

## 2014-09-23 NOTE — Assessment & Plan Note (Signed)
The base of the lung on the right side show some form of atelectasis. Pneumonia cannot be excluded. He does have a mild leukocytosis recently. I will cover him with 3 days course of azithromycin. Due to interaction with Coumadin, I recommend he reduce Coumadin dose by half a tablet for the next 3 days.

## 2014-09-23 NOTE — Telephone Encounter (Signed)
gave pt copy of avs °

## 2014-09-28 ENCOUNTER — Encounter: Payer: Self-pay | Admitting: Hematology and Oncology

## 2014-09-28 NOTE — Progress Notes (Signed)
fmla forms were faxed 09/23/14 to  [phone]. I called and left a message for Lance at [phone] because he needs some more info. I left a message for him to call me bk with what info he needs.

## 2014-09-28 NOTE — Progress Notes (Signed)
I refaxed form to [place] with dates for fmla for daugther

## 2014-09-30 ENCOUNTER — Encounter: Payer: Self-pay | Admitting: Hematology and Oncology

## 2014-09-30 NOTE — Progress Notes (Signed)
I left a message for Mia [place] again to call me about fmla forms. Tammy left message also about date from 2014

## 2014-09-30 NOTE — Progress Notes (Signed)
I called and left Jonathan Reed a message to advise Dr. Calton Dach answer-- there is no date she can give as far as how long. Treatment is for the rest of life  If IR will do Rx, he will not see me back

## 2014-10-03 ENCOUNTER — Ambulatory Visit (INDEPENDENT_AMBULATORY_CARE_PROVIDER_SITE_OTHER): Payer: Medicare Other | Admitting: Pharmacist

## 2014-10-03 DIAGNOSIS — I482 Chronic atrial fibrillation, unspecified: Secondary | ICD-10-CM

## 2014-10-03 DIAGNOSIS — Z8673 Personal history of transient ischemic attack (TIA), and cerebral infarction without residual deficits: Secondary | ICD-10-CM

## 2014-10-03 DIAGNOSIS — Z7901 Long term (current) use of anticoagulants: Secondary | ICD-10-CM

## 2014-10-03 DIAGNOSIS — I639 Cerebral infarction, unspecified: Secondary | ICD-10-CM

## 2014-10-03 LAB — POCT INR: INR: 3

## 2014-10-03 NOTE — Patient Instructions (Signed)
Patient instructed to take medications as defined in the Anti-coagulation Track section of this encounter.  Patient instructed to take today's dose.  Patient verbalized understanding of these instructions.    

## 2014-10-03 NOTE — Progress Notes (Signed)
Anti-Coagulation Progress Note  Jonathan Reed is a 69 y.o. male who is currently on an anti-coagulation regimen.    RECENT RESULTS: Recent results are below, the most recent result is correlated with a dose of 32.5 mg. per week since last visit--with exception of one week, during which time he was on concomitant azithromycin and he was advised by his oncologist to "half  the dose for the 3 days on azithromycin: Lab Results  Component Value Date   INR 3.00 10/03/2014   INR 2.20 09/05/2014   INR 3.60 07/25/2014   PROTIME 30.0* 05/15/2012    ANTI-COAG DOSE: Anticoagulation Dose Instructions as of 10/03/2014      [name] Tue Wed Thu Fri Sat   New Dose 5 mg 2.5 mg 5 mg 2.5 mg 5 mg 2.5 mg 5 mg       ANTICOAG SUMMARY: Anticoagulation Episode Summary    Current INR goal 2.0-3.0  Next INR check 10/17/2014  INR from last check 3.00 (10/03/2014)  Weekly max dose   Target end date Indefinite  [facility] check [facility]  Preferred lab   Send INR reminders to    Indications  Long term (current) use of anticoagulants [Z79.01] CVA (cerebral infarction) [I63.9] Chronic atrial fibrillation [I48.2]        Comments         ANTICOAG TODAY: Anticoagulation Summary as of 10/03/2014    INR goal 2.0-3.0  Selected INR 3.00 (10/03/2014)  Next INR check 10/17/2014  Target end date Indefinite   Indications  Long term (current) use of anticoagulants [Z79.01] CVA (cerebral infarction) [I63.9] Chronic atrial fibrillation [I48.2]      Anticoagulation Episode Summary    [facility]R check location [facility]   Preferred lab    Send INR reminders to    Comments       PATIENT INSTRUCTIONS: Patient Instructions  Patient instructed to take medications as defined in the Anti-coagulation Track section of this encounter.  Patient instructed to take today's dose.  Patient verbalized understanding of these instructions.       FOLLOW-UP Return in 2 weeks (on 10/17/2014) for Follow up INR at  0930h.  [name], III Pharm.D., CACP

## 2014-10-04 ENCOUNTER — Ambulatory Visit: Payer: Medicare Other

## 2014-10-04 ENCOUNTER — Ambulatory Visit (HOSPITAL_COMMUNITY): Payer: Medicare Other

## 2014-10-04 ENCOUNTER — Other Ambulatory Visit: Payer: Medicare Other

## 2014-10-05 ENCOUNTER — Ambulatory Visit: Payer: Medicare Other

## 2014-10-05 ENCOUNTER — Ambulatory Visit: Payer: Medicare Other | Admitting: Hematology and Oncology

## 2014-10-06 ENCOUNTER — Ambulatory Visit
Admission: RE | Admit: 2014-10-06 | Discharge: 2014-10-06 | Disposition: A | Discharge status: Home / Self Care | Payer: Medicare Other | Source: Ambulatory Visit | Attending: Hematology and Oncology | Admitting: Hematology and Oncology

## 2014-10-06 DIAGNOSIS — C787 Secondary malignant neoplasm of liver and intrahepatic bile duct: Secondary | ICD-10-CM

## 2014-10-06 DIAGNOSIS — C7B8 Other secondary neuroendocrine tumors: Secondary | ICD-10-CM

## 2014-10-06 NOTE — Consult Note (Signed)
Chief Complaint: Patient was seen in consultation today for  Chief Complaint  Patient presents with  . Advice Only    Metastatic neuroendocrine tumor to liver     at the request of Gorsuch,Ni  Referring Physician(s): Gorsuch,Ni  History of Present Illness: Jonathan Reed is a 69 y.o. male with metastatic malignant neuroendocrine tumor to the liver. He has demonstrated interval hepatic disease progression since April 2016. He does not tolerate Sandostatin shots very well. He is currently not receiving any systemic chemotherapy. He is referred for directed interventional therapies today. He is experiencing mild right upper quadrant pain, suspect related to liver capsule distention. This does not require any significant pain medication. No physical limitations. Great functional status. He is retired but remains very active. Unfortunately he is not a surgical candidate. He has a history of an AICD, atrial fibrillation and prior stroke. He is on Coumadin for anticoagulation. He is followed by [facility] cardiology, Dr. Boyd Kerbs.  Past Medical History  Diagnosis Date  . CHF (congestive heart failure)     with ICD (f/u with [facility])  . CVA (cerebral infarction) 2011    No residual Sx  . CAD (coronary artery disease) 2000  . HTN (hypertension)   . Nausea alone 01/06/2013  . Abdominal pain, unspecified site 01/06/2013  . Stroke   . Tooth ache 01/04/2014  . Carcinoid syndrome   . Metastatic malignant neuroendocrine tumor to liver   . Metastasis to liver 10/06/2013  . Bronchitis 09/23/2014    Past Surgical History  Procedure Laterality Date  . Cardiac defibrillator placement    . Inguinal hernia repair Bilateral   . Sinus surgery with instatrak    . Colonoscopy  1980    colon polyps, no f/u since.     Allergies: Review of patient's allergies indicates no known allergies.  Medications: Prior to Admission medications   Medication Sig Start Date End Date Taking? Authorizing Provider    lisinopril-hydrochlorothiazide (PRINZIDE,ZESTORETIC) 20-25 MG per tablet Take 1 tablet by mouth daily. 06/29/13  Yes Blain Pais, MD  metoprolol succinate (TOPROL-XL) 50 MG 24 hr tablet Take 1 tablet (50 mg total) by mouth daily. Take with or immediately following a meal. 06/29/13  Yes Blain Pais, MD  oxyCODONE (OXY IR/ROXICODONE) 5 MG immediate release tablet Take 1 tablet (5 mg total) by mouth every 6 (six) hours as needed for moderate pain or severe pain. 09/18/14  Yes Jaynee Eagles, PA-C  spironolactone (ALDACTONE) 25 MG tablet Take 25 mg by mouth daily.   Yes Historical Provider, MD  vitamin C (ASCORBIC ACID) 500 MG tablet Take 500 mg by mouth daily.   Yes Historical Provider, MD  warfarin (COUMADIN) 5 MG tablet Take 1 tablet by mouth daily.  **Patient requests 90 day supply** 06/27/14  Yes Madilyn Fireman, MD  azithromycin (ZITHROMAX) 500 MG tablet Take 1 tablet (500 mg total) by mouth daily. Patient not taking: Reported on 10/03/2014 09/23/14   Heath Lark, MD     Family History  Problem Relation Age of Onset  . Stroke Mother   . Diabetes Father     diabetic coma  . Diabetes Sister   . Cancer Sister     intestinal ca?  . Prostate cancer Brother     prostate  . Breast cancer Maternal Aunt   . Clotting disorder Brother   . Heart disease Brother   . Kidney disease Brother   . Stroke Sister   . Hypertension Daughter  History   Social History  . Marital Status: Married    Spouse Name: N/A  . Number of Children: 2  . Years of Education: N/A   Occupational History  .      refurnishing furniture   Social History Main Topics  . Smoking status: Never Smoker   . Smokeless tobacco: Never Used  . Alcohol Use: No  . Drug Use: No  . Sexual Activity: Not Currently   Other Topics Concern  . None   Social History Narrative    ECOG Status: 0 - Asymptomatic  Review of Systems: A 12 point ROS discussed and pertinent positives are indicated in the HPI above.  All  other systems are negative.  Review of Systems  Constitutional: Negative for fever, activity change, appetite change, fatigue and unexpected weight change.  Respiratory: Negative for shortness of breath.   Cardiovascular: Negative for chest pain.  Gastrointestinal: Negative for abdominal distention.  Genitourinary: Negative for dysuria, frequency and flank pain.    Vital Signs: BP 118/79 mmHg  Pulse 52  Temp(Src) 98.5 F (36.9 C) (Oral)  Resp 15  Ht 5\' 11"  (1.803 m)  Wt 197 lb (89.359 kg)  BMI 27.49 kg/m2  SpO2 99%  Physical Exam  Constitutional: He appears well-developed and well-nourished. No distress.  Cardiovascular: Normal rate, regular rhythm, normal heart sounds and intact distal pulses.  Exam reveals no friction rub.   No murmur heard. Pulmonary/Chest: Effort normal and breath sounds normal. No respiratory distress. He has no wheezes.  Abdominal: Soft. Bowel sounds are normal. He exhibits no distension.  Skin: Skin is warm and dry. No rash noted. He is not diaphoretic. No erythema.  Psychiatric: He has a normal mood and affect. His behavior is normal. Thought content normal.    Imaging: US Abdomen Complete  09/18/2014   CLINICAL DATA:  RIGHT upper quadrant pain for 3 days. Metastatic neuroendocrine tumor to the liver.  EXAM: ULTRASOUND ABDOMEN COMPLETE  COMPARISON:  None.  FINDINGS: Gallbladder: Biliary sludge. No sonographic Murphy sign, wall thickening or pericholecystic fluid. No stones.  Common bile duct: Diameter: 2 mm.  Liver: Diffuse heterogeneous echotexture of the liver compatible with hepatic mid passed the sees identified on prior CT. No intrahepatic biliary ductal dilation identified in the background of metastatic disease.  IVC: No abnormality visualized.  Pancreas: Obscured by bowel gas.  Spleen: Obscured by bowel gas.  Right Kidney: Length: 10.6 cm. Echogenicity within normal limits. No mass or hydronephrosis visualized.  Left Kidney: Length: 10.3 cm.  Echogenicity within normal limits. No mass or hydronephrosis visualized.  Abdominal aorta: No aneurysm visualized.  Other findings: None.  IMPRESSION: 1. Diffuse hepatic metastases. 2. Echogenic biliary sludge. 3. No acute abnormality.   Electronically Signed   By: Dereck Ligas M.D.   On: 09/18/2014 10:49   Ct Abdomen Pelvis W Contrast  09/22/2014   CLINICAL DATA:  Patient with history of endocrine carcinoma metastasis to the liver. Mid abdominal pain. On chemotherapy.  EXAM: CT ABDOMEN AND PELVIS WITH CONTRAST  TECHNIQUE: Multidetector CT imaging of the abdomen and pelvis was performed using the standard protocol following bolus administration of intravenous contrast.  CONTRAST:  163mL OMNIPAQUE IOHEXOL 300 MG/ML  SOLN  COMPARISON:  CT abdomen pelvis 07/05/2014; 01/03/2014  FINDINGS: Lower chest: Partially visualized AICD device. Bandlike consolidation within right lower lobe. Elevation of the right hemidiaphragm.  Hepatobiliary: Interval progression in size and number of hepatic metastasis. Reference lesion within the left hepatic lobe (image 22; series 2) measures 2.2  x 1.8 cm, previously 0.9 x 0.7 cm. Left hepatic lobe lesion (image 23; series 2) measures 1.8 x 1.4 cm, previously 1.0 x 0.7 cm. Hepatic dome mass (image 15; series 2 measures 4.9 x 3.5 cm, previously 4.2 x 2.8 cm. Adjacent larger hepatic dome mass measures 14.3 x 8.3 cm (image 11; series 2), previously 6.5 x 12.3 cm. Gallbladder is unremarkable. No intrahepatic or extrahepatic biliary ductal dilatation.  Pancreas: Unremarkable  Spleen: Multiple calcified granulomata.  Adrenals/Urinary Tract: Unremarkable adrenal glands. Kidneys enhance symmetrically with contrast. Scarring lateral left kidney. No hydronephrosis. Urinary bladder is decompressed. 3 mm nonobstructing stone inferior pole right kidney.  Stomach/Bowel: Normal appendix. No abnormal bowel wall thickening or evidence for bowel obstruction. No free fluid or free intraperitoneal air. Re-  demonstrated 1 cm mass within the terminal ileum, most compatible with carcinoid tumor.  Vascular/Lymphatic: Normal caliber abdominal aorta. No retroperitoneal lymphadenopathy.  Other: Central dystrophic calcifications in the prostate.  Musculoskeletal: No aggressive or acute appearing osseous lesions. Lumbar spine degenerative changes.  IMPRESSION: Interval progression of hepatic metastatic disease.  Bandlike consolidation within the right lower lobe with elevation of the right hemidiaphragm. This may be secondary to atelectasis. Infection or potentially obstructing bronchial lesion are not excluded.   Electronically Signed   By: Lovey Newcomer M.D.   On: 09/22/2014 09:41    Labs:  CBC:  Recent Labs  01/03/14 0825 07/05/14 9381 08/10/14 0804 09/07/14 0811 09/18/14 0856  WBC 4.5 5.1 4.4 6.3 10.4*  HGB 14.5 14.3 13.5 13.7 13.9*  HCT 44.3 42.4 40.5 41.1 42.6*  PLT 191 178 149 146  --     COAGS:  Recent Labs  06/27/14 1015 07/25/14 1155 09/05/14 0946 10/03/14 1039  INR 2.60 3.60 2.20 3.00    BMP:  Recent Labs  07/05/14 0822 08/10/14 0804 09/07/14 0811 09/18/14 0952  NA 141 143 141 136  K 4.2 3.9 3.7 4.0  CL  --   --   --  98  CO2 24 25 28 24   GLUCOSE 104 124 137 115*  BUN 20.8 15.9 17.7 22  CALCIUM 9.6 9.2 9.4 9.0  CREATININE 1.2 1.4* 1.4* 1.45*    LIVER FUNCTION TESTS:  Recent Labs  07/05/14 0822 08/10/14 0804 09/07/14 0811 09/18/14 0952  BILITOT 0.29 0.47 0.59 0.6  AST 19 23 21 14   ALT 25 20 23 16   ALKPHOS 86 93 84 136*  PROT 7.4 6.9 6.9 7.0  ALBUMIN 3.8 3.7 3.5 3.9    TUMOR MARKERS: No results for input(s): AFPTM, CEA, CA199, CHROMGRNA in the last 8760 hours.  Assessment and Plan:  69 year old male with progressive metastatic neuroendocrine tumor to the liver. Hepatic tumor burden has progressed since April 2016. Both lobes are involved. He is developing mild associated right upper quadrant pain, suspect related to liver capsular distention. We  reviewed the interventional therapies including Y 90 radio embolization. The procedure, risks, benefits and alternatives were reviewed. I think he is a good candidate for Y 90 radio embolization currently. He still has a great functional status. His imaging is up-to-date and  therefore he does not need a preprocedure CTA.  plan:  He will need a Lovenox bridge for his Coumadin. He has a history of atrial fibrillation and a stroke as well as a defibrillator. This will be managed by Dr. Johney Maine with the [facility] clinic. His best contact number is [phone]. I spoke with him today. He will need the dates and times of the procedure once scheduled.  This was also discussed with his cardiologist, Dr. Boyd Kerbs in [place].  Schedule pre-Y 90 hepatic mapping, embolization and shunt calculation.  Anticipate actual Y 90 radio embolization 1-2 weeks after the mapping. To expedite his treatment, he would be agreeable to one of my interventional radiology partners performing the treatment.  Thank you for this interesting consult.  I greatly enjoyed meeting Jonathan Reed and look forward to participating in their care.  A copy of this report was sent to the requesting provider on this date.  SignedGreggory Keen 10/06/2014, 1:17 PM   I spent a total of  40 Minutes   in face to face in clinical consultation, greater than 50% of which was counseling/coordinating care for this patient with progressive hepatic neuroendocrine tumor.

## 2014-10-10 NOTE — Progress Notes (Signed)
I have reviewed Dr. Gladstone Pih note.  Patient is on anticoagulation for chronic Afib.

## 2014-10-13 ENCOUNTER — Other Ambulatory Visit: Payer: Self-pay | Admitting: Interventional Radiology

## 2014-10-13 ENCOUNTER — Telehealth: Payer: Self-pay | Admitting: *Deleted

## 2014-10-13 ENCOUNTER — Telehealth: Payer: Self-pay | Admitting: Pharmacist

## 2014-10-13 DIAGNOSIS — C7A8 Other malignant neuroendocrine tumors: Secondary | ICD-10-CM

## 2014-10-13 DIAGNOSIS — C7B8 Other secondary neuroendocrine tumors: Principal | ICD-10-CM

## 2014-10-13 NOTE — Telephone Encounter (Signed)
Pt left VM states he would like Appt to see Dr. Marin Olp for a second opinion.

## 2014-10-13 NOTE — Telephone Encounter (Signed)
Called daughter as documented. Instructed that LAST DOSE warfarin will be Saturday 30-JUL-16. Patient will see me in anticoagulation management clinic on Monday 1-AUG-16 at 1000h for further instructions and LMWH syringes to be provided.

## 2014-10-13 NOTE — Telephone Encounter (Signed)
Left VM for pt's daughter informing her of Referral order placed and to let us know if they do not hear about appt by middle of next week.

## 2014-10-13 NOTE — Telephone Encounter (Signed)
Pls refer him to Dr. Marin Olp

## 2014-10-13 NOTE — Telephone Encounter (Signed)
Discussed planned IR procedure scheduled for 0800h Friday 5-AUG-16 and have called the patient and provided a plan for DISCONTINUATION of warfarin 5d prior to IR procedure, will use LMWH bridge Tx at 1mg /kg SQ q12h.  Bridging Instructions 1. Five (5) days BEFORE your planned procedure-STOP taking your warfarin/Coumadin (LAST DOSE of warfarin will be SATURDAY October 15, 2014) 2. Four (4) days BEFORE your planned procedure-do NOT take warfarin/Coumadin. 3. Three (3) days BEFORE your planned procedure-do NOT take warfarin/Coumadin.  On this day (Monday October 17, 2014)-you will be seen in the anticoagulation management clinic at the [facility] and will be provided the low-molecular weight heparin syringes.  Begin giving yourself the low-molecular weight heparin by syringe that you have been provided. Give the first injection at 8:00PM.  4. Two (2) days BEFORE your planned procedure-do NOT take warfarin/Coumadin. Continue with low-molecular weight heparin injections at 8:00AM and 8:00PM.   5. One (1) day BEFORE your planned procedure-do NOT take warfarin/Coumadin. Give this injection at 8:00AM. This is your LAST DOSE BEFORE YOUR PROCEDURE tomorrow.  6. DAY OF PROCEDURE (Friday, October 21, 2014). NO WARFARIN. NO low-molecular-weight-heparin.  7. Day 1 after procedure-if your Physician or Surgeon indicates it is appropriate-commence your warfarin the evening after your procedure (Saturday, October 22, 2014)-using your warfarin dosing instructions last provided by Dr. Elie Confer. You will also START BACK on the low-molecular-weight-heparin syringe(s) that you should have available. Give your FIRST INJECTION at [facility] on SATURDAY October 22, 2014.  8. Continue giving yourself the low-molecular-weight-heparin injections AND taking your warfarin as instructed on your last instructions provided when you were seen in clinic.  9. Call the clinic at [phone] to schedule an appointment with Dr. Elie Confer or Dr. Mannie Stabile for  Monday, August 8th, 2016. 10. [place] [phone]) to speak with either Dr. Elie Confer or Dr. Maudie Mercury.

## 2014-10-14 ENCOUNTER — Other Ambulatory Visit: Payer: Self-pay | Admitting: Interventional Radiology

## 2014-10-14 DIAGNOSIS — C7A8 Other malignant neuroendocrine tumors: Secondary | ICD-10-CM

## 2014-10-14 DIAGNOSIS — C7B8 Other secondary neuroendocrine tumors: Principal | ICD-10-CM

## 2014-10-17 ENCOUNTER — Telehealth: Payer: Self-pay | Admitting: Hematology & Oncology

## 2014-10-17 ENCOUNTER — Ambulatory Visit (INDEPENDENT_AMBULATORY_CARE_PROVIDER_SITE_OTHER): Payer: Medicare Other | Admitting: Pharmacist

## 2014-10-17 DIAGNOSIS — I482 Chronic atrial fibrillation, unspecified: Secondary | ICD-10-CM

## 2014-10-17 DIAGNOSIS — Z8673 Personal history of transient ischemic attack (TIA), and cerebral infarction without residual deficits: Secondary | ICD-10-CM

## 2014-10-17 DIAGNOSIS — Z7901 Long term (current) use of anticoagulants: Secondary | ICD-10-CM

## 2014-10-17 DIAGNOSIS — I639 Cerebral infarction, unspecified: Secondary | ICD-10-CM

## 2014-10-17 LAB — POCT INR: INR: 1.9

## 2014-10-17 NOTE — Telephone Encounter (Signed)
Lt mess regarding new patient appt on 8/3

## 2014-10-17 NOTE — Progress Notes (Signed)
Anti-Coagulation Progress Note  Jonathan Reed is a 69 y.o. male who is currently on an anti-coagulation regimen.    RECENT RESULTS: Recent results are below, the most recent result is correlated with a dose of warfarin that has been "held" beginning yesterday, Sunday 31-JUL-16 in advance of planned procedure by IR on Friday 5-AUG-16. He will commence an 80mg  SQ q12h bridge with LMWH enoxaparin tonight at 8PM and continue LMWH every 12h until LAST dose scheduled for Thursday 4-AUG-16 at 0800h. This will give 24h OFF any anticoagulation prior to procedure on Friday 5-AUG-16. Lab Results  Component Value Date   INR 1.90 10/17/2014   INR 3.00 10/03/2014   INR 2.20 09/05/2014   PROTIME 30.0* 05/15/2012    ANTI-COAG DOSE: Anticoagulation Dose Instructions as of 10/17/2014      [name] Tue Wed Thu Fri Sat   New Dose 5 mg 0 mg 0 mg 0 mg 0 mg 0 mg 5 mg       ANTICOAG SUMMARY: Anticoagulation Episode Summary    Current INR goal 2.0-3.0  Next INR check 10/20/2014  INR from last check 1.90! (10/17/2014)  Weekly max dose   Target end date Indefinite  INR check location [facility]  Preferred lab   Send INR reminders to    Indications  Long term (current) use of anticoagulants [Z79.01] CVA (cerebral infarction) [I63.9] Chronic atrial fibrillation [I48.2]        Comments         ANTICOAG TODAY: Anticoagulation Summary as of 10/17/2014    INR goal 2.0-3.0  Selected INR 1.90! (10/17/2014)  Next INR check 10/20/2014  Target end date Indefinite   Indications  Long term (current) use of anticoagulants [Z79.01] CVA (cerebral infarction) [I63.9] Chronic atrial fibrillation [I48.2]      Anticoagulation Episode Summary    INR check location [facility]   Preferred lab    Send INR reminders to    Comments       PATIENT INSTRUCTIONS: Patient Instructions  Patient instructed to take medications as defined in the Anti-coagulation Track section of this encounter.  Patient instructed  to OMIT today's dose.  Patient instructed of the following:  Bridging Instructions 1. Five (5) days BEFORE your planned procedure-STOP taking your warfarin/Coumadin (LAST DOSE of warfarin will be SATURDAY October 15, 2014) 2. Four (4) days BEFORE your planned procedure-do NOT take warfarin/Coumadin. 3. Three (3) days BEFORE your planned procedure-do NOT take warfarin/Coumadin.  On this day (Monday October 17, 2014)-you will begin giving yourself the low-molecular weight heparin by syringe that you should have available. Give the first injection at 8:00PM.  4. Two (2) days BEFORE your planned procedure-do NOT take warfarin/Coumadin. Continue with low-molecular weight heparin injections at 8:00AM and 8:00PM.   5. One (1) day BEFORE your planned procedure-do NOT take warfarin/Coumadin. Give this injection at 8:00AM. This is your LAST DOSE BEFORE YOUR PROCEDURE tomorrow.  6. DAY OF PROCEDURE Friday, October 21, 2014). NO WARFARIN. NO low-molecular-weight-heparin.  7. Day 1 after procedure-if your Physician or Surgeon indicates it is appropriate-commence your warfarin the evening after your procedure (Saturday, October 22, 2014)-using your warfarin dosing instructions last provided by Dr. Elie Confer. You will also START BACK on the low-molecular-weight-heparin syringe(s) that you should have available. Give your FIRST INJECTION at [facility] on SATURDAY October 22, 2014.  8. Continue giving yourself the low-molecular-weight-heparin injections AND taking your warfarin as instructed on your last instructions provided when you were seen in clinic.  9. Call the clinic  at [phone] to schedule an appointment with Dr. Elie Confer or Dr. Mannie Stabile for Monday, August 8th, 2016. 10. [place] [phone]) to speak with either Dr. Elie Confer or Dr. Maudie Mercury. Patient verbalized understanding of these instructions.       FOLLOW-UP Return in 3 days (on 10/20/2014) for Follow up INR at 0930h.  [name], III Pharm.D., CACP

## 2014-10-17 NOTE — Patient Instructions (Signed)
Patient instructed to take medications as defined in the Anti-coagulation Track section of this encounter.  Patient instructed to OMIT today's dose.  Patient instructed of the following:  Bridging Instructions 1. Five (5) days BEFORE your planned procedure-STOP taking your warfarin/Coumadin (LAST DOSE of warfarin will be SATURDAY October 15, 2014) 2. Four (4) days BEFORE your planned procedure-do NOT take warfarin/Coumadin. 3. Three (3) days BEFORE your planned procedure-do NOT take warfarin/Coumadin.  On this day (Monday October 17, 2014)-you will begin giving yourself the low-molecular weight heparin by syringe that you should have available. Give the first injection at 8:00PM.  4. Two (2) days BEFORE your planned procedure-do NOT take warfarin/Coumadin. Continue with low-molecular weight heparin injections at 8:00AM and 8:00PM.   5. One (1) day BEFORE your planned procedure-do NOT take warfarin/Coumadin. Give this injection at 8:00AM. This is your LAST DOSE BEFORE YOUR PROCEDURE tomorrow.  6. DAY OF PROCEDURE Friday, October 21, 2014). NO WARFARIN. NO low-molecular-weight-heparin.  7. Day 1 after procedure-if your Physician or Surgeon indicates it is appropriate-commence your warfarin the evening after your procedure (Saturday, October 22, 2014)-using your warfarin dosing instructions last provided by Dr. Elie Confer. You will also START BACK on the low-molecular-weight-heparin syringe(s) that you should have available. Give your FIRST INJECTION at [facility] on SATURDAY October 22, 2014.  8. Continue giving yourself the low-molecular-weight-heparin injections AND taking your warfarin as instructed on your last instructions provided when you were seen in clinic.  9. Call the clinic at [phone] to schedule an appointment with Dr. Elie Confer or Dr. Mannie Stabile for Monday, August 8th, 2016. 10. [place] [phone]) to speak with either Dr. Elie Confer or Dr. Maudie Mercury. Patient verbalized  understanding of these instructions.

## 2014-10-18 NOTE — Telephone Encounter (Signed)
LVM regarding new pt appt on 10/19/14, for pt to call back and confirm

## 2014-10-19 ENCOUNTER — Telehealth: Payer: Self-pay | Admitting: Hematology & Oncology

## 2014-10-19 ENCOUNTER — Other Ambulatory Visit: Payer: Medicare Other

## 2014-10-19 ENCOUNTER — Encounter: Payer: Medicare Other | Admitting: Hematology & Oncology

## 2014-10-19 ENCOUNTER — Ambulatory Visit: Payer: Medicare Other

## 2014-10-19 NOTE — Telephone Encounter (Signed)
Lt mess regarding resched his new pt appt.

## 2014-10-20 ENCOUNTER — Other Ambulatory Visit: Payer: Self-pay | Admitting: Radiology

## 2014-10-20 ENCOUNTER — Other Ambulatory Visit: Payer: Self-pay | Admitting: Physician Assistant

## 2014-10-20 ENCOUNTER — Ambulatory Visit (INDEPENDENT_AMBULATORY_CARE_PROVIDER_SITE_OTHER): Payer: Medicare Other | Admitting: Pharmacist

## 2014-10-20 DIAGNOSIS — I482 Chronic atrial fibrillation, unspecified: Secondary | ICD-10-CM

## 2014-10-20 DIAGNOSIS — Z8673 Personal history of transient ischemic attack (TIA), and cerebral infarction without residual deficits: Secondary | ICD-10-CM

## 2014-10-20 DIAGNOSIS — Z7901 Long term (current) use of anticoagulants: Secondary | ICD-10-CM | POA: Diagnosis not present

## 2014-10-20 DIAGNOSIS — I639 Cerebral infarction, unspecified: Secondary | ICD-10-CM

## 2014-10-20 LAB — POCT INR: INR: 1.3

## 2014-10-20 NOTE — Progress Notes (Signed)
Anti-Coagulation Progress Note  Jonathan Reed is a 69 y.o. male who is currently on an anti-coagulation regimen.    RECENT RESULTS: Recent results are below, the most recent result is correlated with a dose of ZERO mg warfarin for past 4 days, tomorrow's held dose = 5 days off warfarin. INR < /= to 1.3 readying him for planned procedure tomorrow. Last LMWH injection was this morning. He understands this was LAST DOSE prior to procedure. He will recommence BOTH warfarin and LMWH on Saturday 6-AUG-16 (24h minimally after procedure).  Lab Results  Component Value Date   INR 1.30 10/20/2014   INR 1.90 10/17/2014   INR 3.00 10/03/2014   PROTIME 30.0* 05/15/2012    ANTI-COAG DOSE: Anticoagulation Dose Instructions as of 10/20/2014      [name] Tue Wed Thu Fri Sat   New Dose 5 mg 0 mg 0 mg 0 mg 0 mg 0 mg 5 mg       ANTICOAG SUMMARY: Anticoagulation Episode Summary    Current INR goal 2.0-3.0  Next INR check 10/24/2014  INR from last check 1.30! (10/20/2014)  Weekly max dose   Target end date Indefinite  INR check location [facility]  Preferred lab   Send INR reminders to    Indications  Long term (current) use of anticoagulants [Z79.01] CVA (cerebral infarction) [I63.9] Chronic atrial fibrillation [I48.2]        Comments         ANTICOAG TODAY: Anticoagulation Summary as of 10/20/2014    INR goal 2.0-3.0  Selected INR 1.30! (10/20/2014)  Next INR check 10/24/2014  Target end date Indefinite   Indications  Long term (current) use of anticoagulants [Z79.01] CVA (cerebral infarction) [I63.9] Chronic atrial fibrillation [I48.2]      Anticoagulation Episode Summary    INR check location [facility]   Preferred lab    Send INR reminders to    Comments       PATIENT INSTRUCTIONS: Patient Instructions  Patient instructed to take medications as defined in the Anti-coagulation Track section of this encounter.  Patient instructed to OMIT today's dose, OMIT tomorrow's  dose--all as previously documented and provided to patient.   Patient instructed to use Bridging instructions previously provided that will see BOTH warfarin and low-molecular-weight heparin injections being recommenced on Saturday 6-AUG-16. Patient verbalized understanding of these instructions.       FOLLOW-UP Return in 4 days (on 10/24/2014) for Follow up INR at 0930 with Dr. Maudie Mercury.  [name], III Pharm.D., CACP

## 2014-10-20 NOTE — Patient Instructions (Signed)
Patient instructed to take medications as defined in the Anti-coagulation Track section of this encounter.  Patient instructed to OMIT today's dose, OMIT tomorrow's dose--all as previously documented and provided to patient.   Patient instructed to use Bridging instructions previously provided that will see BOTH warfarin and low-molecular-weight heparin injections being recommenced on Saturday 6-AUG-16. Patient verbalized understanding of these instructions.

## 2014-10-21 ENCOUNTER — Ambulatory Visit (HOSPITAL_COMMUNITY)
Admission: RE | Admit: 2014-10-21 | Discharge: 2014-10-21 | Disposition: A | Discharge status: Home / Self Care | Payer: Medicare Other | Source: Ambulatory Visit | Attending: Interventional Radiology | Admitting: Interventional Radiology

## 2014-10-21 ENCOUNTER — Encounter (HOSPITAL_COMMUNITY): Payer: Self-pay

## 2014-10-21 ENCOUNTER — Encounter (HOSPITAL_COMMUNITY)
Admission: RE | Admit: 2014-10-21 | Discharge: 2014-10-21 | Disposition: A | Discharge status: Home / Self Care | Payer: Medicare Other | Source: Ambulatory Visit | Attending: Interventional Radiology | Admitting: Interventional Radiology

## 2014-10-21 ENCOUNTER — Other Ambulatory Visit: Payer: Self-pay | Admitting: Interventional Radiology

## 2014-10-21 DIAGNOSIS — Z8673 Personal history of transient ischemic attack (TIA), and cerebral infarction without residual deficits: Secondary | ICD-10-CM | POA: Diagnosis not present

## 2014-10-21 DIAGNOSIS — I4891 Unspecified atrial fibrillation: Secondary | ICD-10-CM | POA: Insufficient documentation

## 2014-10-21 DIAGNOSIS — C7A8 Other malignant neuroendocrine tumors: Secondary | ICD-10-CM

## 2014-10-21 DIAGNOSIS — C7B8 Other secondary neuroendocrine tumors: Principal | ICD-10-CM

## 2014-10-21 DIAGNOSIS — Z79899 Other long term (current) drug therapy: Secondary | ICD-10-CM | POA: Diagnosis not present

## 2014-10-21 DIAGNOSIS — Z7901 Long term (current) use of anticoagulants: Secondary | ICD-10-CM | POA: Insufficient documentation

## 2014-10-21 DIAGNOSIS — Z95810 Presence of automatic (implantable) cardiac defibrillator: Secondary | ICD-10-CM | POA: Diagnosis not present

## 2014-10-21 DIAGNOSIS — I509 Heart failure, unspecified: Secondary | ICD-10-CM | POA: Diagnosis not present

## 2014-10-21 LAB — BASIC METABOLIC PANEL
ANION GAP: 8 (ref 5–15)
BUN: 21 mg/dL — ABNORMAL HIGH (ref 6–20)
CALCIUM: 9.3 mg/dL (ref 8.9–10.3)
CHLORIDE: 105 mmol/L (ref 101–111)
CO2: 24 mmol/L (ref 22–32)
Creatinine, Ser: 1.51 mg/dL — ABNORMAL HIGH (ref 0.61–1.24)
GFR calc Af Amer: 54 mL/min — ABNORMAL LOW (ref >60)
GFR calc non Af Amer: 46 mL/min — ABNORMAL LOW (ref >60)
Glucose, Bld: 105 mg/dL — ABNORMAL HIGH (ref 65–99)
Potassium: 3.8 mmol/L (ref 3.5–5.1)
Sodium: 137 mmol/L (ref 135–145)

## 2014-10-21 LAB — CBC
HEMATOCRIT: 42 % (ref 39.0–52.0)
Hemoglobin: 14.1 g/dL (ref 13.0–17.0)
MCH: 30.9 pg (ref 26.0–34.0)
MCHC: 33.6 g/dL (ref 30.0–36.0)
MCV: 92.1 fL (ref 78.0–100.0)
PLATELETS: 177 10*3/uL (ref 150–400)
RBC: 4.56 MIL/uL (ref 4.22–5.81)
RDW: 14.8 % (ref 11.5–15.5)
WBC: 5.3 10*3/uL (ref 4.0–10.5)

## 2014-10-21 LAB — PROTIME-INR
INR: 1.1 (ref 0.00–1.49)
PROTHROMBIN TIME: 14.4 s (ref 11.6–15.2)

## 2014-10-21 LAB — HEPATIC FUNCTION PANEL
ALBUMIN: 3.9 g/dL (ref 3.5–5.0)
ALT: 30 U/L (ref 17–63)
AST: 31 U/L (ref 15–41)
Alkaline Phosphatase: 115 U/L (ref 38–126)
BILIRUBIN INDIRECT: 0.5 mg/dL (ref 0.3–0.9)
BILIRUBIN TOTAL: 0.7 mg/dL (ref 0.3–1.2)
Bilirubin, Direct: 0.2 mg/dL (ref 0.1–0.5)
Total Protein: 7.4 g/dL (ref 6.5–8.1)

## 2014-10-21 LAB — APTT: aPTT: 32 seconds (ref 24–37)

## 2014-10-21 MED ORDER — CEFAZOLIN SODIUM-DEXTROSE 2-3 GM-% IV SOLR: 2.0000 g | INTRAVENOUS | Status: DC

## 2014-10-21 MED ORDER — SODIUM CHLORIDE 0.9 % IV SOLN
INTRAVENOUS | Status: DC
  Administered 2014-10-21: 08:00:00 via INTRAVENOUS

## 2014-10-21 MED ORDER — IOHEXOL 300 MG/ML  SOLN
101.0000 mL | Freq: Once | INTRAMUSCULAR | Status: AC | PRN
  Administered 2014-10-21: 101 mL via INTRA_ARTERIAL

## 2014-10-21 MED ORDER — MIDAZOLAM HCL 2 MG/2ML IJ SOLN
INTRAMUSCULAR | Status: AC
  Filled 2014-10-21: qty 6

## 2014-10-21 MED ORDER — LIDOCAINE HCL 1 % IJ SOLN
INTRAMUSCULAR | Status: AC
  Filled 2014-10-21: qty 20

## 2014-10-21 MED ORDER — FENTANYL CITRATE (PF) 100 MCG/2ML IJ SOLN
INTRAMUSCULAR | Status: AC
  Filled 2014-10-21: qty 4

## 2014-10-21 MED ORDER — TECHNETIUM TO 99M ALBUMIN AGGREGATED
5.5000 | Freq: Once | INTRAVENOUS | Status: AC | PRN
  Administered 2014-10-21: 5.5 via INTRAVENOUS

## 2014-10-21 MED ORDER — MIDAZOLAM HCL 2 MG/2ML IJ SOLN
INTRAMUSCULAR | Status: AC | PRN
  Administered 2014-10-21: 0.5 mg via INTRAVENOUS
  Administered 2014-10-21: 1 mg via INTRAVENOUS
  Administered 2014-10-21 (×3): 0.5 mg via INTRAVENOUS

## 2014-10-21 MED ORDER — CEFAZOLIN SODIUM-DEXTROSE 2-3 GM-% IV SOLR
INTRAVENOUS | Status: AC
  Filled 2014-10-21: qty 50

## 2014-10-21 MED ORDER — FENTANYL CITRATE (PF) 100 MCG/2ML IJ SOLN
INTRAMUSCULAR | Status: AC | PRN
  Administered 2014-10-21: 50 ug via INTRAVENOUS

## 2014-10-21 MED ORDER — IOHEXOL 300 MG/ML  SOLN
68.0000 mL | Freq: Once | INTRAMUSCULAR | Status: AC | PRN
  Administered 2014-10-21: 68 mL via INTRA_ARTERIAL

## 2014-10-21 NOTE — Sedation Documentation (Signed)
Pt transported to Recovery on stretcher.

## 2014-10-21 NOTE — Discharge Instructions (Signed)
Arteriogram Care After These instructions give you information on caring for yourself after your procedure. Your doctor may also give you more specific instructions. Call your doctor if you have any problems or questions after your procedure. HOME CARE  Keep your leg straight for at least 6 hours.  Do not bathe, swim, or use a hot tub until directed by your doctor. You can shower.  Do not lift anything heavier than 10 pounds (about a gallon of milk) for 2 days.  Do not walk a lot, run, or drive for 2 days.  Return to normal activities in 2 days or as told by your doctor. Finding out the results of your test Ask when your test results will be ready. Make sure you get your test results. GET HELP RIGHT AWAY IF:   You have fever.  You have more pain in your leg.  The leg that was cut is:  Bleeding.  Puffy (swollen) or red.  Cold.  Pale or changes color.  Weak.  Tingly or numb. If you go to the Emergency Room, tell your nurse that you have had an arteriogram. Take this paper with you to show the nurse. MAKE SURE YOU:  Understand these instructions.  Will watch your condition.  Will get help right away if you are not doing well or get worse. Document Released: 05/31/2008 Document Revised: 03/09/2013 Document Reviewed: 05/31/2008 [facility] Patient Information 2015 [place], [place]. This information is not intended to replace advice given to you by your health care provider. Make sure you discuss any questions you have with your health care provider. Conscious Sedation, Adult, Care After Refer to this sheet in the next few weeks. These instructions provide you with information on caring for yourself after your procedure. Your health care provider may also give you more specific instructions. Your treatment has been planned according to current medical practices, but problems sometimes occur. Call your health care provider if you have any problems or questions after your  procedure. WHAT TO EXPECT AFTER THE PROCEDURE  After your procedure:  You may feel sleepy, clumsy, and have poor balance for several hours.  Vomiting may occur if you eat too soon after the procedure. HOME CARE INSTRUCTIONS  Do not participate in any activities where you could become injured for at least 24 hours. Do not:  Drive.  Swim.  Ride a bicycle.  Operate heavy machinery.  Cook.  Use power tools.  Climb ladders.  Work from a high place.  Do not make important decisions or sign legal documents until you are improved.  If you vomit, drink water, juice, or soup when you can drink without vomiting. Make sure you have little or no nausea before eating solid foods.  Only take over-the-counter or prescription medicines for pain, discomfort, or fever as directed by your health care provider.  Make sure you and your family fully understand everything about the medicines given to you, including what side effects may occur.  You should not drink alcohol, take sleeping pills, or take medicines that cause drowsiness for at least 24 hours.  If you smoke, do not smoke without supervision.  If you are feeling better, you may resume normal activities 24 hours after you were sedated.  Keep all appointments with your health care provider. SEEK MEDICAL CARE IF:  Your skin is pale or bluish in color.  You continue to feel nauseous or vomit.  Your pain is getting worse and is not helped by medicine.  You have bleeding or  swelling.  You are still sleepy or feeling clumsy after 24 hours. SEEK IMMEDIATE MEDICAL CARE IF:  You develop a rash.  You have difficulty breathing.  You develop any type of allergic problem.  You have a fever. MAKE SURE YOU:  Understand these instructions.  Will watch your condition.  Will get help right away if you are not doing well or get worse. Document Released: 12/23/2012 Document Reviewed: 12/23/2012 [facility] Patient Information 2015  [place], [place]. This information is not intended to replace advice given to you by your health care provider. Make sure you discuss any questions you have with your health care provider.   In 2 weeks (August 22) you will have the Y-90 procedure, the instructions below are the post procedure instructions for that day.  Please read over these and be familiar with them.   Post Y-90 Radioembolization Discharge Instructions  You have been given a radioactive material during your procedure.  While it is safe for you to be discharged home from the hospital, you need to proceed directly home.    Do not use public transportation, including air travel, lasting more than 2 hours for 1 week.  Avoid crowded public places for 1 week.  Adult visitors should try to avoid close contact with you for 1 week.    Children and pregnant females should not visit or have close contact with you for 1 week.  Items that you touch are not radioactive.  Do not sleep in the same bed as your partner for 1 week, and a condom should be used for sexual activity during the first 24 hours.  Your blood may be radioactive and caution should be used if any bleeding occurs during the recovery period.  Body fluids may be radioactive for 24 hours.  Wash your hands after voiding.  Men should sit to urinate.  Dispose of any soiled materials (flush down toilet or place in trash at home) during the first day.  Drink 6 to 8 glasses of fluids per day for 5 days to hydrate yourself.  If you need to see a doctor during the first week, you must let them know that you were treated with yttrium-90 microspheres, and will be slightly radioactive.  They can call Interventional Radiology [phone] with any questions.

## 2014-10-21 NOTE — Sedation Documentation (Addendum)
Pt transferred in stretcher to [place].

## 2014-10-21 NOTE — Procedures (Signed)
Successful viceral angiograms, cystic artery embo, and tech MAA INJECTION FOR SHUNT CALCULATION NO COMP STABLE FULL REPORT IN PACS PLAN FOR RT HEPATIC Y90 ON 8/22

## 2014-10-21 NOTE — H&P (Signed)
HPI: Patient with progressive metastatic neuroendocrine tumor of liver, seen in full IR consult on 10/06/14 and scheduled today for image guided pre Y-90 radioembolization.  The patient has had a H&P performed within the last 30 days, all history, medications, and exam have been reviewed. The patient denies any interval changes since the H&P.  Medications: Prior to Admission medications   Medication Sig Start Date End Date Taking? Authorizing Provider  azithromycin (ZITHROMAX) 500 MG tablet Take 1 tablet (500 mg total) by mouth daily. Patient not taking: Reported on 10/21/2014 09/23/14   Heath Lark, MD  enoxaparin (LOVENOX) 80 MG/0.8ML injection Inject 80 mg into the skin every 12 (twelve) hours. 0800 and 2000    Historical Provider, MD  lisinopril-hydrochlorothiazide (PRINZIDE,ZESTORETIC) 20-25 MG per tablet Take 1 tablet by mouth daily. 06/29/13   Blain Pais, MD  metoprolol succinate (TOPROL-XL) 50 MG 24 hr tablet Take 1 tablet (50 mg total) by mouth daily. Take with or immediately following a meal. 06/29/13   Blain Pais, MD  oxyCODONE (OXY IR/ROXICODONE) 5 MG immediate release tablet Take 1 tablet (5 mg total) by mouth every 6 (six) hours as needed for moderate pain or severe pain. Patient not taking: Reported on 10/21/2014 09/18/14   Jaynee Eagles, PA-C  spironolactone (ALDACTONE) 25 MG tablet Take 25 mg by mouth daily.    Historical Provider, MD  vitamin C (ASCORBIC ACID) 500 MG tablet Take 500 mg by mouth daily.    Historical Provider, MD  warfarin (COUMADIN) 5 MG tablet Take 1 tablet by mouth daily.  **Patient requests 90 day supply** 06/27/14   Madilyn Fireman, MD    Vital Signs: BP 108/89 mmHg  Pulse 83  Temp(Src) 98 F (36.7 C) (Oral)  Resp 16  Ht 5\' 11"  (1.803 m)  Wt 197 lb (89.359 kg)  BMI 27.49 kg/m2  SpO2 99%  Physical Exam  Constitutional: He is oriented to person, place, and time. No distress.  HENT:  Head: Normocephalic and atraumatic.  Cardiovascular: Intact  distal pulses.  Exam reveals no gallop and no friction rub.   No murmur heard. IRR IRR  Pulmonary/Chest: Effort normal and breath sounds normal. No respiratory distress. He has no wheezes. He has no rales.  Abdominal: Soft. Bowel sounds are normal. He exhibits no distension. There is no tenderness.  Neurological: He is alert and oriented to person, place, and time.  Skin: Skin is warm and dry. He is not diaphoretic.  AICD intact    Mallampati Score:  MD Evaluation Airway: WNL Heart: WNL Abdomen: WNL Chest/ Lungs: WNL ASA  Classification: 3 Mallampati/Airway Score: Two  Labs:  CBC:  Recent Labs  07/05/14 0821 08/10/14 0804 09/07/14 0811 09/18/14 0856 10/21/14 0732  WBC 5.1 4.4 6.3 10.4* 5.3  HGB 14.3 13.5 13.7 13.9* 14.1  HCT 42.4 40.5 41.1 42.6* 42.0  PLT 178 149 146  --  177    COAGS:  Recent Labs  10/03/14 1039 10/17/14 1022 10/20/14 0936 10/21/14 0732  INR 3.00 1.90 1.30 1.10  APTT  --   --   --  32    BMP:  Recent Labs  08/10/14 0804 09/07/14 0811 09/18/14 0952 10/21/14 0732  NA 143 141 136 137  K 3.9 3.7 4.0 3.8  CL  --   --  98 105  CO2 25 28 24 24   GLUCOSE 124 137 115* 105*  BUN 15.9 17.7 22 21*  CALCIUM 9.2 9.4 9.0 9.3  CREATININE 1.4* 1.4* 1.45* 1.51*  GFRNONAA  --   --   --  46*  GFRAA  --   --   --  54*    LIVER FUNCTION TESTS:  Recent Labs  07/05/14 0822 08/10/14 0804 09/07/14 0811 09/18/14 0952  BILITOT 0.29 0.47 0.59 0.6  AST 19 23 21 14   ALT 25 20 23 16   ALKPHOS 86 93 84 136*  PROT 7.4 6.9 6.9 7.0  ALBUMIN 3.8 3.7 3.5 3.9    Assessment/Plan:  Progressive metastatic neuroendocrine tumor of liver Seen in full IR consult on 10/06/14 Scheduled today for image guided pre Y-90 radioembolization with moderate sedation The patient has been NPO, no blood thinners taken, labs and vitals have been reviewed. Risks and Benefits discussed with the patient including, but not limited to bleeding, infection, vascular injury or  contrast induced renal failure. All of the patient's questions were answered, patient is agreeable to proceed. Consent signed and in chart. CHF s/p AICD CVA AFib on coumadin-last dose 7/30, bridged with Lovenox last dose yesterday morning   Signed: Hedy Jacob 10/21/2014, 8:44 AM

## 2014-10-21 NOTE — Sedation Documentation (Signed)
5FR sheath removed from R femoral artery by Robina Ade, RTR.  Hemostasis achieved using manual pressure.  R groin level 0, RDP 1+.

## 2014-10-24 ENCOUNTER — Inpatient Hospital Stay (HOSPITAL_COMMUNITY): Admission: RE | Admit: 2014-10-24 | Payer: Medicare Other | Source: Ambulatory Visit

## 2014-10-24 ENCOUNTER — Ambulatory Visit (INDEPENDENT_AMBULATORY_CARE_PROVIDER_SITE_OTHER): Payer: Medicare Other | Admitting: Pharmacist

## 2014-10-24 DIAGNOSIS — I482 Chronic atrial fibrillation, unspecified: Secondary | ICD-10-CM

## 2014-10-24 DIAGNOSIS — Z8673 Personal history of transient ischemic attack (TIA), and cerebral infarction without residual deficits: Secondary | ICD-10-CM

## 2014-10-24 DIAGNOSIS — Z7901 Long term (current) use of anticoagulants: Secondary | ICD-10-CM

## 2014-10-24 LAB — POCT INR: INR: 1.4

## 2014-10-24 MED ORDER — WARFARIN SODIUM 5 MG PO TABS: ORAL_TABLET | ORAL | Status: DC

## 2014-10-24 NOTE — Progress Notes (Signed)
Anticoagulation Management Jonathan Reed is a 69 y.o. male who reports to the clinic for monitoring of warfarin treatment.    Indication: atrial fibrillation Duration: indefinite  Anticoagulation Clinic Visit History: Anticoagulation Episode Summary    Current INR goal 2.0-3.0  Next INR check 10/31/2014  INR from last check 1.4! (10/24/2014)  Weekly max dose   Target end date Indefinite  [facility]  Preferred lab   Send INR reminders to    Indications  Long term (current) use of anticoagulants [Z79.01] CVA (cerebral infarction) [I63.9] Chronic atrial fibrillation [I48.2]        Comments        ASSESSMENT Recent Results: Recent results are below, the most recent result is correlated with a dose of 10 mg per week: Lab Results  Component Value Date   INR 1.4 10/24/2014   INR 1.10 10/21/2014   INR 1.30 10/20/2014   PROTIME 30.0* 05/15/2012   INR today: Subtherapeutic  Anticoagulation Dosing: INR as of 10/24/2014 and Previous Dosing Information    INR Dt INR Goal [employer] Sun Mon Tue Wed Thu Fri Sat   10/24/2014 1.4 2.0-3.0 10 mg 5 mg 0 mg 0 mg 0 mg 0 mg 0 mg 5 mg    Anticoagulation Dose Instructions as of 10/24/2014      Total Sun Mon Tue Wed Thu Fri Sat   New Dose 30 mg 5 mg 5 mg 5 mg 2.5 mg 5 mg 2.5 mg 5 mg     (5 mg x 1)  (5 mg x 1)  (5 mg x 1)  (5 mg x 0.5)  (5 mg x 1)  (5 mg x 0.5)  (5 mg x 1)                           PLAN Patient was resumed back on a weekly maintenance dose of 30 mg per week  Patient Instructions  Patient and Caregiver educated about medication as defined in this encounter and verbalized understanding by repeating back instructions provided.   Follow-up Return in about 1 week (around 10/31/2014) for Follow up INR on 10/31/14 at 8:30am.  Flossie Dibble, Clinical Pharmacist (patient seen in clinic with Viann Fish, PharmD Candidate)  15 minutes spent face-to-face with the patient during the encounter. 50% of time spent  on education. 50% of time was spent on assessment and plan.

## 2014-10-24 NOTE — Patient Instructions (Signed)
Patient and Caregiver educated about medication as defined in this encounter and verbalized understanding by repeating back instructions provided.   

## 2014-10-25 NOTE — Progress Notes (Signed)
I have reviewed Dr. Gladstone Pih note.  Warfarin on hold for procedure.  LMWH was supplied.  He is on anticoagulation for Afib/CVA.

## 2014-10-26 NOTE — Progress Notes (Signed)
I have reviewed Dr. Gladstone Pih note.  Patient is on anticoagulation for chronic Afib.  Warfarin on hold for procedure.  LMWH bridge provided as per Dr. Gladstone Pih note.

## 2014-10-31 ENCOUNTER — Ambulatory Visit: Payer: Medicare Other | Admitting: Internal Medicine

## 2014-10-31 ENCOUNTER — Ambulatory Visit (INDEPENDENT_AMBULATORY_CARE_PROVIDER_SITE_OTHER): Payer: Medicare Other | Admitting: Pharmacist

## 2014-10-31 DIAGNOSIS — I482 Chronic atrial fibrillation, unspecified: Secondary | ICD-10-CM

## 2014-10-31 DIAGNOSIS — Z7901 Long term (current) use of anticoagulants: Secondary | ICD-10-CM | POA: Diagnosis not present

## 2014-10-31 DIAGNOSIS — Z8673 Personal history of transient ischemic attack (TIA), and cerebral infarction without residual deficits: Secondary | ICD-10-CM

## 2014-10-31 DIAGNOSIS — I639 Cerebral infarction, unspecified: Secondary | ICD-10-CM

## 2014-10-31 LAB — POCT INR: INR: 2.9

## 2014-10-31 NOTE — Progress Notes (Signed)
Anti-Coagulation Progress Note  Jonathan Reed is a 69 y.o. male who is currently on an anti-coagulation regimen.    RECENT RESULTS: Recent results are below, the most recent result is correlated with a dose of 30 mg. per week with an interruption in warfarin as documented in CHL/EPIC around an IR procedure for which LMWH was used as a bridge before and post-procedure: Lab Results  Component Value Date   INR 2.90 10/31/2014   INR 1.4 10/24/2014   INR 1.10 10/21/2014   PROTIME 30.0* 05/15/2012    ANTI-COAG DOSE: Anticoagulation Dose Instructions as of 10/31/2014      [name] Tue Wed Thu Fri Sat   New Dose 5 mg 2.5 mg 5 mg 2.5 mg 5 mg 2.5 mg 5 mg       ANTICOAG SUMMARY: Anticoagulation Episode Summary    Current INR goal 2.0-3.0  Next INR check 11/02/2014  INR from last check 2.90 (10/31/2014)  Weekly max dose   Target end date Indefinite  [facility]  Preferred lab   Send INR reminders to    Indications  Long term (current) use of anticoagulants [Z79.01] CVA (cerebral infarction) [I63.9] Chronic atrial fibrillation [I48.2]        Comments         ANTICOAG TODAY: Anticoagulation Summary as of 10/31/2014    INR goal 2.0-3.0  Selected INR 2.90 (10/31/2014)  Next INR check 11/02/2014  Target end date Indefinite   Indications  Long term (current) use of anticoagulants [Z79.01] CVA (cerebral infarction) [I63.9] Chronic atrial fibrillation [I48.2]      Anticoagulation Episode Summary    [facility]   Preferred lab    Send INR reminders to    Comments       PATIENT INSTRUCTIONS: Patient Instructions  Patient instructed to take medications as defined in the Anti-coagulation Track section of this encounter.  Patient instructed to take today's dose.  Patient verbalized understanding of these instructions.       FOLLOW-UP Return in 2 days (on 11/02/2014) for Follow up INR and new bridging instructions for NEXT procedure  planned for 22-AUG-16. [name], III Pharm.D., CACP

## 2014-10-31 NOTE — Patient Instructions (Signed)
Patient instructed to take medications as defined in the Anti-coagulation Track section of this encounter.  Patient instructed to take today's dose.  Patient verbalized understanding of these instructions.    

## 2014-11-02 ENCOUNTER — Ambulatory Visit (INDEPENDENT_AMBULATORY_CARE_PROVIDER_SITE_OTHER): Payer: Medicare Other | Admitting: Pharmacist

## 2014-11-02 DIAGNOSIS — I482 Chronic atrial fibrillation, unspecified: Secondary | ICD-10-CM

## 2014-11-02 DIAGNOSIS — Z7901 Long term (current) use of anticoagulants: Secondary | ICD-10-CM

## 2014-11-02 DIAGNOSIS — Z8673 Personal history of transient ischemic attack (TIA), and cerebral infarction without residual deficits: Secondary | ICD-10-CM | POA: Diagnosis not present

## 2014-11-02 DIAGNOSIS — I6389 Other cerebral infarction: Secondary | ICD-10-CM

## 2014-11-02 LAB — POCT INR: INR: 3

## 2014-11-02 NOTE — Patient Instructions (Signed)
Patient instructed to take medications as defined in the Anti-coagulation Track section of this encounter.  Patient instructed to take today's dose.  Patient verbalized understanding of these instructions.  Patient instructed of the following "BRIDGING INSTRUCTIONS" as provided to patient and family:  1. Five (5) days BEFORE your planned procedure-STOP taking your warfarin/Coumadin (LAST DOSE of warfarin will be [facility] November 02, 2014) 2. Four (4) days BEFORE your planned procedure [facility] November 03, 2014-do NOT take warfarin/Coumadin. 3. Three (3) days BEFORE your planned procedure (FRIDAY November 04, 2014-do NOT take warfarin/Coumadin.  On this day (FRIDAY November 04, 2014)-you will begin giving yourself the low-molecular weight heparin by syringe that you should have available. Give the first injection at 8:00PM.   4. Two (2) days BEFORE your planned procedure (SATURDAY November 05, 2014)-do NOT take warfarin/Coumadin. Continue with low-molecular weight heparin injections at 8:00AM and 8:00PM.    5. One (1) day BEFORE your planned procedure [facility] November 06, 2014)-do NOT take warfarin/Coumadin. Give this injection at 8:00AM. This is your LAST DOSE BEFORE YOUR PROCEDURE tomorrow.   6. DAY OF PROCEDURE [facility] November 07, 2014). NO WARFARIN. NO low-molecular-weight-heparin.   7. Day 1 after procedure (TUESDAY November 08, 2014)-if your Physician or Surgeon indicates it is appropriate-commence your warfarin the evening after your procedure-using your warfarin dosing instructions last provided by Dr. Elie Confer (1/2 tablet on Mondays/Wednesdays/Fridays; 1 tablet on all other days). You will also START BACK on the low-molecular-weight-heparin syringe(s) that you should have available. Give your FIRST INJECTION at [facility] on TUESDAY November 08, 2014.   8. Continue giving yourself the low-molecular-weight-heparin injections at 8:00AM and 8:00PM AND taking your warfarin as instructed on your last instructions provided  when you were seen in clinic-( 1/2 tablet on Mondays/Wednesdays/Fridays; 1 tablet on all other days). 9. Return to clinic on MONDAY November 14, 2014 at 9:30AM for an appointment in the anticoagulation management clinic with Dr. Elie Confer.  10. [place] [phone]) to speak with either Dr. Elie Confer or Dr. Maudie Mercury.

## 2014-11-02 NOTE — Progress Notes (Signed)
Anti-Coagulation Progress Note  Jonathan Reed is a 69 y.o. male who is currently on an anti-coagulation regimen.    RECENT RESULTS: Recent results are below, the most recent result is correlated with a dose of 27.5 mg. per week:  TODAY will be LAST DOSE of warfarin. He will start tomorrow using his "Bridging Instructions" provided and documented--of which the patient and family support system have been provided copies. See patient instruction section of this note. I will MEET THE PATIENT ON Sunday 21-AUG-16 at 1000h in the [facility] to perform the "confirming" INR (target less than or equal to 1.5) and provide the written document/print-out from www.doseresposne.com for the patient to take with him for day of procedure confirmation of having an appropriate INR for procedure on Monday 22-AUG-16.  Lab Results  Component Value Date   INR 3.0 11/02/2014   INR 2.90 10/31/2014   INR 1.4 10/24/2014   PROTIME 30.0* 05/15/2012    ANTI-COAG DOSE: Anticoagulation Dose Instructions as of 11/02/2014      [name] Tue Wed Thu Fri Sat   New Dose Hold Hold 5 mg 2.5 mg Hold Hold Hold       ANTICOAG SUMMARY: Anticoagulation Episode Summary    Current INR goal 2.0-3.0  Next INR check 11/14/2014  INR from last check 3.0 (11/02/2014)  Weekly max dose   Target end date Indefinite  [facility]  Preferred lab   Send INR reminders to    Indications  Long term (current) use of anticoagulants [Z79.01] CVA (cerebral infarction) [I63.9] Chronic atrial fibrillation [I48.2]        Comments         ANTICOAG TODAY: Anticoagulation Summary as of 11/02/2014    INR goal 2.0-3.0  Selected INR 3.0 (11/02/2014)  Next INR check 11/14/2014  Target end date Indefinite   Indications  Long term (current) use of anticoagulants [Z79.01] CVA (cerebral infarction) [I63.9] Chronic atrial fibrillation [I48.2]      Anticoagulation Episode Summary    [facility]   Preferred lab     Send INR reminders to    Comments       PATIENT INSTRUCTIONS: Patient Instructions  Patient instructed to take medications as defined in the Anti-coagulation Track section of this encounter.  Patient instructed to take today's dose.  Patient verbalized understanding of these instructions.  Patient instructed of the following "BRIDGING INSTRUCTIONS" as provided to patient and family:  1. Five (5) days BEFORE your planned procedure-STOP taking your warfarin/Coumadin (LAST DOSE of warfarin will be [name] November 02, 2014) 2. Four (4) days BEFORE your planned procedure [name] November 03, 2014-do NOT take warfarin/Coumadin. 3. Three (3) days BEFORE your planned procedure (FRIDAY November 04, 2014-do NOT take warfarin/Coumadin.  On this day (FRIDAY November 04, 2014)-you will begin giving yourself the low-molecular weight heparin by syringe that you should have available. Give the first injection at 8:00PM.   4. Two (2) days BEFORE your planned procedure (SATURDAY November 05, 2014)-do NOT take warfarin/Coumadin. Continue with low-molecular weight heparin injections at 8:00AM and 8:00PM.    5. One (1) day BEFORE your planned procedure [name] November 06, 2014)-do NOT take warfarin/Coumadin. Give this injection at 8:00AM. This is your LAST DOSE BEFORE YOUR PROCEDURE tomorrow.   6. DAY OF PROCEDURE [name] November 07, 2014). NO WARFARIN. NO low-molecular-weight-heparin.   7. Day 1 after procedure (TUESDAY November 08, 2014)-if your Physician or Surgeon indicates it is appropriate-commence your warfarin the evening after your procedure-using  your warfarin dosing instructions last provided by Dr. Elie Confer (1/2 tablet on Mondays/Wednesdays/Fridays; 1 tablet on all other days). You will also START BACK on the low-molecular-weight-heparin syringe(s) that you should have available. Give your FIRST INJECTION at [name] on TUESDAY November 08, 2014.   8. Continue giving yourself the low-molecular-weight-heparin injections at  8:00AM and 8:00PM AND taking your warfarin as instructed on your last instructions provided when you were seen in clinic-( 1/2 tablet on Mondays/Wednesdays/Fridays; 1 tablet on all other days). 9. Return to clinic on MONDAY November 14, 2014 at 9:30AM for an appointment in the anticoagulation management clinic with Dr. Elie Confer.  10. [name] [phone]) to speak with either Dr. Elie Confer or Dr. Maudie Mercury.    FOLLOW-UP Return in 4 days (on 11/06/2014) for Confirming INR prior to procedure. I will be meeting the patient on Sunday AUGUST 21 at 1000h.[name], III Pharm.D., CACP

## 2014-11-02 NOTE — Progress Notes (Signed)
INTERNAL MEDICINE TEACHING ATTENDING ADDENDUM - Aldine Contes M.D  Duration- indefinite, Indication- afib, INR- therapeutic. Agree with pharmacy recommendations as outlined in their note. Patient to hold coumadin for surgical procedure and follow up with Dr. Elie Confer

## 2014-11-03 ENCOUNTER — Encounter: Payer: Medicare Other | Admitting: Internal Medicine

## 2014-11-04 ENCOUNTER — Other Ambulatory Visit: Payer: Self-pay | Admitting: Physician Assistant

## 2014-11-04 ENCOUNTER — Other Ambulatory Visit: Payer: Self-pay | Admitting: Radiology

## 2014-11-06 ENCOUNTER — Encounter: Payer: Self-pay | Admitting: Pharmacist

## 2014-11-06 ENCOUNTER — Telehealth: Payer: Self-pay | Admitting: Pharmacist

## 2014-11-06 DIAGNOSIS — Z7901 Long term (current) use of anticoagulants: Secondary | ICD-10-CM

## 2014-11-06 NOTE — Telephone Encounter (Signed)
Patient seen on Sunday at 1007h for purpose of confirmatory INR (goal less than or equal to 1.5). INR today = 1.5 after 4 days of warfarin having been HELD in advance of IR procedure tomorrow, Monday 22-AUG-16. Patient took LAST dose of LMWH this morning at 0800h. He and his wife both were able to verbalize the plan of action:  CONTINUED "HOLD" of warfarin, and NO MORE LMWH injections in-advance of the procedure tomorrow. Patient has plan of action instructions that instruct him to recommence BOTH warfarin and LMWH with the EVENING DOSE on Tuesday 23-AUG-16. Patient will be seen on follow up, Monday 29-AUG-16 but has been instructed to call me on my cell phone or report to the emergency department anytime between tomorrow's procedure and next scheduled visit if there were to be any signs or symptoms of bleeding, shortness of breath, chest-pain etc.

## 2014-11-07 ENCOUNTER — Encounter (HOSPITAL_COMMUNITY)
Admission: RE | Admit: 2014-11-07 | Discharge: 2014-11-07 | Disposition: A | Discharge status: Home / Self Care | Payer: Medicare Other | Source: Ambulatory Visit | Attending: Interventional Radiology | Admitting: Interventional Radiology

## 2014-11-07 ENCOUNTER — Encounter (HOSPITAL_COMMUNITY): Payer: Self-pay

## 2014-11-07 ENCOUNTER — Ambulatory Visit (HOSPITAL_COMMUNITY)
Admission: RE | Admit: 2014-11-07 | Discharge: 2014-11-07 | Disposition: A | Discharge status: Home / Self Care | Payer: Medicare Other | Source: Ambulatory Visit | Attending: Interventional Radiology | Admitting: Interventional Radiology

## 2014-11-07 ENCOUNTER — Other Ambulatory Visit: Payer: Self-pay | Admitting: Interventional Radiology

## 2014-11-07 DIAGNOSIS — Z880 Allergy status to penicillin: Secondary | ICD-10-CM | POA: Diagnosis not present

## 2014-11-07 DIAGNOSIS — Z7901 Long term (current) use of anticoagulants: Secondary | ICD-10-CM | POA: Insufficient documentation

## 2014-11-07 DIAGNOSIS — C7A8 Other malignant neuroendocrine tumors: Secondary | ICD-10-CM | POA: Insufficient documentation

## 2014-11-07 DIAGNOSIS — C7B8 Other secondary neuroendocrine tumors: Secondary | ICD-10-CM | POA: Diagnosis not present

## 2014-11-07 DIAGNOSIS — Z79899 Other long term (current) drug therapy: Secondary | ICD-10-CM | POA: Diagnosis not present

## 2014-11-07 HISTORY — DX: Gout, unspecified: M10.9

## 2014-11-07 LAB — COMPREHENSIVE METABOLIC PANEL
ALT: 61 U/L (ref 17–63)
ANION GAP: 5 (ref 5–15)
AST: 54 U/L — AB (ref 15–41)
Albumin: 3.4 g/dL — ABNORMAL LOW (ref 3.5–5.0)
Alkaline Phosphatase: 227 U/L — ABNORMAL HIGH (ref 38–126)
BILIRUBIN TOTAL: 0.5 mg/dL (ref 0.3–1.2)
BUN: 28 mg/dL — AB (ref 6–20)
CHLORIDE: 103 mmol/L (ref 101–111)
CO2: 27 mmol/L (ref 22–32)
Calcium: 9.4 mg/dL (ref 8.9–10.3)
Creatinine, Ser: 1.53 mg/dL — ABNORMAL HIGH (ref 0.61–1.24)
GFR calc Af Amer: 53 mL/min — ABNORMAL LOW (ref >60)
GFR, EST NON AFRICAN AMERICAN: 46 mL/min — AB (ref >60)
Glucose, Bld: 111 mg/dL — ABNORMAL HIGH (ref 65–99)
POTASSIUM: 4.3 mmol/L (ref 3.5–5.1)
Sodium: 135 mmol/L (ref 135–145)
TOTAL PROTEIN: 7.4 g/dL (ref 6.5–8.1)

## 2014-11-07 LAB — CBC WITH DIFFERENTIAL/PLATELET
BASOS PCT: 0 % (ref 0–1)
Basophils Absolute: 0 10*3/uL (ref 0.0–0.1)
EOS ABS: 0.1 10*3/uL (ref 0.0–0.7)
EOS PCT: 1 % (ref 0–5)
HCT: 40.1 % (ref 39.0–52.0)
HEMOGLOBIN: 13.2 g/dL (ref 13.0–17.0)
Lymphocytes Relative: 19 % (ref 12–46)
Lymphs Abs: 1.2 10*3/uL (ref 0.7–4.0)
MCH: 30.8 pg (ref 26.0–34.0)
MCHC: 32.9 g/dL (ref 30.0–36.0)
MCV: 93.5 fL (ref 78.0–100.0)
MONOS PCT: 10 % (ref 3–12)
Monocytes Absolute: 0.6 10*3/uL (ref 0.1–1.0)
NEUTROS PCT: 70 % (ref 43–77)
Neutro Abs: 4.5 10*3/uL (ref 1.7–7.7)
PLATELETS: 319 10*3/uL (ref 150–400)
RBC: 4.29 MIL/uL (ref 4.22–5.81)
RDW: 14.5 % (ref 11.5–15.5)
WBC: 6.4 10*3/uL (ref 4.0–10.5)

## 2014-11-07 LAB — APTT: APTT: 33 s (ref 24–37)

## 2014-11-07 LAB — PROTIME-INR
INR: 1.28 (ref 0.00–1.49)
PROTHROMBIN TIME: 16.2 s — AB (ref 11.6–15.2)

## 2014-11-07 MED ORDER — IOHEXOL 300 MG/ML  SOLN
12.0000 mL | Freq: Once | INTRAMUSCULAR | Status: DC | PRN
  Administered 2014-11-07: 12 mL via INTRA_ARTERIAL
  Filled 2014-11-07: qty 20

## 2014-11-07 MED ORDER — MIDAZOLAM HCL 2 MG/2ML IJ SOLN
INTRAMUSCULAR | Status: AC | PRN
  Administered 2014-11-07 (×2): 0.5 mg via INTRAVENOUS
  Administered 2014-11-07: 1 mg via INTRAVENOUS

## 2014-11-07 MED ORDER — LIDOCAINE HCL 1 % IJ SOLN
INTRAMUSCULAR | Status: AC
  Filled 2014-11-07: qty 20

## 2014-11-07 MED ORDER — MIDAZOLAM HCL 2 MG/2ML IJ SOLN
INTRAMUSCULAR | Status: AC
  Filled 2014-11-07: qty 6

## 2014-11-07 MED ORDER — SODIUM CHLORIDE 0.9 % IV SOLN
INTRAVENOUS | Status: DC
  Administered 2014-11-07: 500 mL via INTRAVENOUS

## 2014-11-07 MED ORDER — FENTANYL CITRATE (PF) 100 MCG/2ML IJ SOLN
INTRAMUSCULAR | Status: AC
  Filled 2014-11-07: qty 4

## 2014-11-07 MED ORDER — SODIUM CHLORIDE 0.9 % IV SOLN
8.0000 mg | Freq: Once | INTRAVENOUS | Status: AC
  Administered 2014-11-07: 8 mg via INTRAVENOUS
  Filled 2014-11-07: qty 4

## 2014-11-07 MED ORDER — PIPERACILLIN-TAZOBACTAM 3.375 G IVPB 30 MIN
3.3750 g | Freq: Once | INTRAVENOUS | Status: AC
  Administered 2014-11-07: 3.375 g via INTRAVENOUS
  Filled 2014-11-07: qty 50

## 2014-11-07 MED ORDER — DEXAMETHASONE SODIUM PHOSPHATE 10 MG/ML IJ SOLN
20.0000 mg | Freq: Once | INTRAMUSCULAR | Status: AC
  Administered 2014-11-07: 20 mg via INTRAVENOUS
  Filled 2014-11-07 (×2): qty 2

## 2014-11-07 MED ORDER — OCTREOTIDE ACETATE 100 MCG/ML IJ SOLN
200.0000 ug | Freq: Once | INTRAMUSCULAR | Status: AC
  Administered 2014-11-07: 200 ug via INTRAVENOUS
  Filled 2014-11-07: qty 2

## 2014-11-07 MED ORDER — PANTOPRAZOLE SODIUM 40 MG IV SOLR
40.0000 mg | Freq: Once | INTRAVENOUS | Status: AC
  Administered 2014-11-07: 40 mg via INTRAVENOUS
  Filled 2014-11-07: qty 40

## 2014-11-07 MED ORDER — OCTREOTIDE ACETATE 100 MCG/ML IJ SOLN
200.0000 ug | Freq: Once | INTRAMUSCULAR | Status: DC
  Filled 2014-11-07 (×2): qty 2

## 2014-11-07 MED ORDER — IOHEXOL 300 MG/ML  SOLN
20.0000 mL | Freq: Once | INTRAMUSCULAR | Status: DC | PRN
  Administered 2014-11-07: 20 mL via INTRA_ARTERIAL
  Filled 2014-11-07: qty 20

## 2014-11-07 MED ORDER — IOHEXOL 300 MG/ML  SOLN
58.0000 mL | Freq: Once | INTRAMUSCULAR | Status: DC | PRN
  Administered 2014-11-07: 58 mL via INTRA_ARTERIAL
  Filled 2014-11-07: qty 60

## 2014-11-07 MED ORDER — FENTANYL CITRATE (PF) 100 MCG/2ML IJ SOLN
INTRAMUSCULAR | Status: AC | PRN
  Administered 2014-11-07 (×2): 25 ug via INTRAVENOUS

## 2014-11-07 NOTE — Sedation Documentation (Signed)
5Fr sheath removed from R fem artery by Dr. Annamaria Boots.  Hemostasis achieved using Exoseal closure device. Groin level 0, 3+RDP.

## 2014-11-07 NOTE — Sedation Documentation (Signed)
Pt transported to Short Stay on Bed with RN for Recovery.

## 2014-11-07 NOTE — Procedures (Signed)
Successful RT HEPATIC Y90 radioembolization No comp Stable Full report in pacs

## 2014-11-07 NOTE — Sedation Documentation (Signed)
Gauze/tegaderm bandage applied to R fem artery puncture site.  CDI, Level 0, 3+RDP.

## 2014-11-07 NOTE — Discharge Instructions (Signed)
Post Y-90 Radioembolization Discharge Instructions  You have been given a radioactive material during your procedure.  While it is safe for you to be discharged home from the hospital, you need to proceed directly home.    Do not use public transportation, including air travel, lasting more than 2 hours for 1 week.  Avoid crowded public places for 1 week.  Adult visitors should try to avoid close contact with you for 1 week.    Children and pregnant females should not visit or have close contact with you for 1 week.  Items that you touch are not radioactive.  Do not sleep in the same bed as your partner for 1 week, and a condom should be used for sexual activity during the first 24 hours.  Your blood may be radioactive and caution should be used if any bleeding occurs during the recovery period.  Body fluids may be radioactive for 24 hours.  Wash your hands after voiding.  Men should sit to urinate.  Dispose of any soiled materials (flush down toilet or place in trash at home) during the first day.  Drink 6 to 8 glasses of fluids per day for 5 days to hydrate yourself.  If you need to see a doctor during the first week, you must let them know that you were treated with yttrium-90 microspheres, and will be slightly radioactive.  They can call Interventional Radiology [phone] with any questions.  Arteriogram Care After These instructions give you information on caring for yourself after your procedure. Your doctor may also give you more specific instructions. Call your doctor if you have any problems or questions after your procedure. HOME CARE  Keep your leg straight for at least 6 hours.  Do not bathe, swim, or use a hot tub until directed by your doctor. You can shower.  Do not lift anything heavier than 10 pounds (about a gallon of milk) for 2 days.  Do not walk a lot, run, or drive for 2 days.  Return to normal activities in 2 days or as told by your doctor. Finding out the  results of your test Ask when your test results will be ready. Make sure you get your test results. GET HELP RIGHT AWAY IF:   You have fever.  You have more pain in your leg.  The leg that was cut is:  Bleeding.  Puffy (swollen) or red.  Cold.  Pale or changes color.  Weak.  Tingly or numb. If you go to the Emergency Room, tell your nurse that you have had an arteriogram. Take this paper with you to show the nurse. MAKE SURE YOU:  Understand these instructions.  Will watch your condition.  Will get help right away if you are not doing well or get worse. Document Released: 05/31/2008 Document Revised: 03/09/2013 Document Reviewed: 05/31/2008 [employer] [facility] Patient Information 2015 [place], [place]. This information is not intended to replace advice given to you by your health care provider. Make sure you discuss any questions you have with your health care provider.  Conscious Sedation Sedation is the use of medicines to promote relaxation and relieve discomfort and anxiety. Conscious sedation is a type of sedation. Under conscious sedation you are less alert than normal but are still able to respond to instructions or stimulation. Conscious sedation is used during short medical and dental procedures. It is milder than deep sedation or general anesthesia and allows you to return to your regular activities sooner.  [place]  ABOUT:   Any allergies you have.  All medicines you are taking, including vitamins, herbs, eye drops, creams, and over-the-counter medicines.  Use of steroids (by mouth or creams).  Previous problems you or members of your family have had with the use of anesthetics.  Any blood disorders you have.  Previous surgeries you have had.  Medical conditions you have.  Possibility of pregnancy, if this applies.  Use of cigarettes, alcohol, or illegal drugs. RISKS AND COMPLICATIONS Generally, this is a safe procedure. However, as with  any procedure, problems can occur. Possible problems include:  Oversedation.  Trouble breathing on your own. You may need to have a breathing tube until you are awake and breathing on your own.  Allergic reaction to any of the medicines used for the procedure. BEFORE THE PROCEDURE  You may have blood tests done. These tests can help show how well your kidneys and liver are working. They can also show how well your blood clots.  A physical exam will be done.  Only take medicines as directed by your health care provider. You may need to stop taking medicines (such as blood thinners, aspirin, or nonsteroidal anti-inflammatory drugs) before the procedure.   Do not eat or drink at least 6 hours before the procedure or as directed by your health care provider.  Arrange for a responsible adult, family member, or friend to take you home after the procedure. He or she should stay with you for at least 24 hours after the procedure, until the medicine has worn off. PROCEDURE   An intravenous (IV) catheter will be inserted into one of your veins. Medicine will be able to flow directly into your body through this catheter. You may be given medicine through this tube to help prevent pain and help you relax.  The medical or dental procedure will be done. AFTER THE PROCEDURE  You will stay in a recovery area until the medicine has worn off. Your blood pressure and pulse will be checked.   Depending on the procedure you had, you may be allowed to go home when you can tolerate liquids and your pain is under control. Document Released: 11/27/2000 Document Revised: 03/09/2013 Document Reviewed: 11/09/2012 [facility] Patient Information 2015 [place], [place]. This information is not intended to replace advice given to you by your health care provider. Make sure you discuss any questions you have with your health care provider.

## 2014-11-07 NOTE — Progress Notes (Signed)
Patient ID: Jonathan Reed, male   DOB: [birth year], 69 y.o.   MRN: [id number]    Referring Physician(s): Alvy Bimler  Chief Complaint: Metastatic neuroendocrine tumor to liver   Subjective: Pt familiar to IR service from recent consultation 10/06/14 /pre y-90 hepatic mapping/ radioembolization 10/21/14 secondary to metastatic neuroendocrine tumor to liver. He presents today for Y-90 hepatic radioembolization. He has no significant medical changes since last visit and is currently asymptomatic.   Allergies: Penicillins  Medications: Prior to Admission medications   Medication Sig Start Date End Date Taking? Authorizing Provider  lisinopril-hydrochlorothiazide (PRINZIDE,ZESTORETIC) 20-25 MG per tablet Take 1 tablet by mouth daily. 06/29/13  Yes Blain Pais, MD  metoprolol succinate (TOPROL-XL) 50 MG 24 hr tablet Take 1 tablet (50 mg total) by mouth daily. Take with or immediately following a meal. 06/29/13  Yes Blain Pais, MD  spironolactone (ALDACTONE) 25 MG tablet Take 25 mg by mouth daily.   Yes Historical Provider, MD  warfarin (COUMADIN) 5 MG tablet Take 1 tablet by mouth daily. Adjust dose as instructed by [facility].  **Patient requests 90 day supply** Patient not taking: Reported on 11/06/2014 10/24/14  Yes Madilyn Fireman, MD  oxyCODONE (OXY IR/ROXICODONE) 5 MG immediate release tablet Take 1 tablet (5 mg total) by mouth every 6 (six) hours as needed for moderate pain or severe pain. 09/18/14   Jaynee Eagles, PA-C     Vital Signs: BP 97/71 mmHg  Pulse 50  Temp(Src) 97.4 F (36.3 C) (Oral)  Resp 18  Ht 5\' 11"  (1.803 m)  Wt 182 lb 8 oz (82.781 kg)  BMI 25.46 kg/m2  SpO2 100%  Physical Exam pt awake/alert; chest- CTA bilat; heart- bradycardic, nl S1,S2; left chest wall pacer; abd- soft,+BS,NT,ND; ext- FROM, no edema  Imaging: No results found.  Labs:  CBC:  Recent Labs  07/05/14 0821 08/10/14 0804 09/07/14 0811 09/18/14 0856 10/21/14 0732  WBC 5.1 4.4  6.3 10.4* 5.3  HGB 14.3 13.5 13.7 13.9* 14.1  HCT 42.4 40.5 41.1 42.6* 42.0  PLT 178 149 146  --  177    COAGS:  Recent Labs  10/21/14 0732 10/24/14 0908 10/31/14 1037 11/02/14 1018  INR 1.10 1.4 2.90 3.0  APTT 32  --   --   --     BMP:  Recent Labs  08/10/14 0804 09/07/14 0811 09/18/14 0952 10/21/14 0732  NA 143 141 136 137  K 3.9 3.7 4.0 3.8  CL  --   --  98 105  CO2 25 28 24 24   GLUCOSE 124 137 115* 105*  BUN 15.9 17.7 22 21*  CALCIUM 9.2 9.4 9.0 9.3  CREATININE 1.4* 1.4* 1.45* 1.51*  GFRNONAA  --   --   --  46*  GFRAA  --   --   --  54*    LIVER FUNCTION TESTS:  Recent Labs  08/10/14 0804 09/07/14 0811 09/18/14 0952 10/21/14 0732  BILITOT 0.47 0.59 0.6 0.7  AST 23 21 14 31   ALT 20 23 16 30   ALKPHOS 93 84 136* 115  PROT 6.9 6.9 7.0 7.4  ALBUMIN 3.7 3.5 3.9 3.9    Assessment and Plan: Pt with history of neuroendocrine tumor with unresectable liver metastases, s/p pre Y-90 mapping/ prophylactic embolization 10/21/14. Plan is for Y-90 hepatic radioembolization today. Details/risks of procedure d/w pt/family including bleeding, infection, renal injury d/w pt/family with their understanding and consent.    Signed: D. Rowe Robert 11/07/2014, 8:32 AM   I spent a total  of 15 minutes at the the patient's bedside AND on the patient's hospital floor or unit, greater than 50% of which was counseling/coordinating care for  Y-90 hepatic radioembolization

## 2014-11-08 LAB — CHROMOGRANIN A: CHROMOGRANIN A: 1 nmol/L (ref 0–5)

## 2014-11-08 LAB — CEA: CEA: 3.1 ng/mL (ref 0.0–4.7)

## 2014-11-08 LAB — AFP TUMOR MARKER: AFP-Tumor Marker: 9 ng/mL — ABNORMAL HIGH (ref 0.0–8.3)

## 2014-11-11 ENCOUNTER — Ambulatory Visit: Payer: Medicare Other | Admitting: Internal Medicine

## 2014-11-14 ENCOUNTER — Ambulatory Visit (INDEPENDENT_AMBULATORY_CARE_PROVIDER_SITE_OTHER): Payer: Medicare Other | Admitting: Pharmacist

## 2014-11-14 DIAGNOSIS — Z8673 Personal history of transient ischemic attack (TIA), and cerebral infarction without residual deficits: Secondary | ICD-10-CM

## 2014-11-14 DIAGNOSIS — I482 Chronic atrial fibrillation, unspecified: Secondary | ICD-10-CM

## 2014-11-14 DIAGNOSIS — I639 Cerebral infarction, unspecified: Secondary | ICD-10-CM

## 2014-11-14 DIAGNOSIS — Z7901 Long term (current) use of anticoagulants: Secondary | ICD-10-CM

## 2014-11-14 LAB — POCT INR: INR: 2.1

## 2014-11-14 NOTE — Progress Notes (Signed)
INTERNAL MEDICINE TEACHING ATTENDING ADDENDUM - Maxamillian Tienda M.D  Duration- indefinite, Indication- afib, INR- therapeutic. Agree with pharmacy recommendations as outlined in their note.     

## 2014-11-14 NOTE — Patient Instructions (Signed)
Patient instructed to take medications as defined in the Anti-coagulation Track section of this encounter.  Patient instructed to take today's dose.  Patient verbalized understanding of these instructions.  Patient has been provided #10 syringes of LMWH enoxaparin 80mg  for bridging off of warfarin into his planned IR procedure for 9-SEP-16.  Patient has been provided the following instructions regarding discontinuation of warfarin, commencing LMWH and post-procedure instructions for recommencing BOTH drugs 24h after procedure:    Bridging Instructions 1. Five (5) days BEFORE your planned procedure-STOP taking your warfarin/Coumadin (LAST DOSE of warfarin will be SUNDAY September 4rd, 2016) 2. Four (4) days BEFORE your planned procedure [facility] September 5th, 2016-do NOT take warfarin/Coumadin. 3. Three (3) days BEFORE your planned procedure (TUESDAY September 6th, 2016-do NOT take warfarin/Coumadin.  On this day (TUESDAY September 6th, 2016)-you will begin giving yourself the low-molecular weight heparin by syringe that you should have available. Give the first injection at 8:00PM.   4. Two (2) days BEFORE your planned procedure [facility] September 7th, 2016)-do NOT take warfarin/Coumadin. Continue with low-molecular weight heparin injections at 8:00AM and 8:00PM.    5. One (1) day BEFORE your planned procedure [facility] September 8th, 2016)-do NOT take warfarin/Coumadin. Give this injection at 8:00AM. This is your LAST DOSE BEFORE YOUR PROCEDURE tomorrow.  COME TO CLINIC ON THURSDAY SEPTEMBER 8th at 9:30AM to have your prothrombin time test/INR performed so you can take this result to your physician performing the procedure tomorrow.  6. DAY OF PROCEDURE (FRIDAY September 9th, 2016). NO WARFARIN. NO low-molecular-weight-heparin.   7. Day 1 after procedure [facility] September 10th, 2016)-if your Physician or Surgeon indicates it is appropriate-commence your warfarin the evening after your  procedure-using your warfarin dosing instructions last provided by Dr. Elie Confer (1/2 tablet on Sundays/Wednesdays/Fridays; 1 tablet on all other days). You will also START BACK on the low-molecular-weight-heparin syringe(s) that you should have available. Give your FIRST INJECTION at [facility] on SATURDAY September 10th, 2016.   8. The next day, [facility] September 11th, continue giving yourself the low-molecular-weight-heparin injections at 8:00AM and 8:00PM AND taking your warfarin as instructed on your last instructions provided when you were seen in clinic-( 1/2 tablet on Sundays/Wednesdays/Fridays; 1 tablet on all other days). 9. Return to clinic on MONDAY SEPTEMBER 12th, 2016 at 9:30AM for an appointment in the anticoagulation management clinic with Dr. Elie Confer.  10. [place] [phone]) to speak with either Dr. Elie Confer or Dr. Maudie Mercury.

## 2014-11-14 NOTE — Progress Notes (Signed)
Anti-Coagulation Progress Note  Jonathan Reed is a 69 y.o. male who is currently on an anti-coagulation regimen.    RECENT RESULTS: Recent results are below, the most recent result is correlated with a dose of 27.5 mg. per week:  Patient will continue this regimen until 5 days prior to IR procedure planned for 9-SEPT-16. He has been provided full-detailed written instructions regarding his discontinuation of warfarin, his commencing LMWH 80mg  injections, discontinuation of LMWH 24h prior to procedure; recommencing BOTH medications 24h after procedure and seeing me on Monday 12-SEP-16. See instructions provided.  Lab Results  Component Value Date   INR 2.10 11/14/2014   INR 1.28 11/07/2014   INR 3.0 11/02/2014   PROTIME 30.0* 05/15/2012    ANTI-COAG DOSE: Anticoagulation Dose Instructions as of 11/14/2014      Sun Mon Tue Wed Thu Fri Sat   New Dose 2.5 mg 5 mg 5 mg 2.5 mg 5 mg 2.5 mg 5 mg       ANTICOAG SUMMARY: Anticoagulation Episode Summary    Current INR goal 2.0-3.0  Next INR check 11/24/2014  INR from last check 2.10 (11/14/2014)  Weekly max dose   Target end date Indefinite  INR check location [facility]  Preferred lab   Send INR reminders to    Indications  Long term (current) use of anticoagulants [Z79.01] CVA (cerebral infarction) [I63.9] Chronic atrial fibrillation [I48.2]        Comments         ANTICOAG TODAY: Anticoagulation Summary as of 11/14/2014    INR goal 2.0-3.0  Selected INR 2.10 (11/14/2014)  Next INR check 11/24/2014  Target end date Indefinite   Indications  Long term (current) use of anticoagulants [Z79.01] CVA (cerebral infarction) [I63.9] Chronic atrial fibrillation [I48.2]      Anticoagulation Episode Summary    INR check location [facility]   Preferred lab    Send INR reminders to    Comments       PATIENT INSTRUCTIONS: Patient Instructions  Patient instructed to take medications as defined in the Anti-coagulation Track  section of this encounter.  Patient instructed to take today's dose.  Patient verbalized understanding of these instructions.  Patient has been provided #10 syringes of LMWH enoxaparin 80mg  for bridging off of warfarin into his planned IR procedure for 9-SEP-16.  Patient has been provided the following instructions regarding discontinuation of warfarin, commencing LMWH and post-procedure instructions for recommencing BOTH drugs 24h after procedure:    Bridging Instructions 1. Five (5) days BEFORE your planned procedure-STOP taking your warfarin/Coumadin (LAST DOSE of warfarin will be SUNDAY September 4rd, 2016) 2. Four (4) days BEFORE your planned procedure [facility] September 5th, 2016-do NOT take warfarin/Coumadin. 3. Three (3) days BEFORE your planned procedure (TUESDAY September 6th, 2016-do NOT take warfarin/Coumadin.  On this day (TUESDAY September 6th, 2016)-you will begin giving yourself the low-molecular weight heparin by syringe that you should have available. Give the first injection at 8:00PM.   4. Two (2) days BEFORE your planned procedure [facility] September 7th, 2016)-do NOT take warfarin/Coumadin. Continue with low-molecular weight heparin injections at 8:00AM and 8:00PM.    5. One (1) day BEFORE your planned procedure [facility] September 8th, 2016)-do NOT take warfarin/Coumadin. Give this injection at 8:00AM. This is your LAST DOSE BEFORE YOUR PROCEDURE tomorrow.  COME TO CLINIC ON THURSDAY SEPTEMBER 8th at 9:30AM to have your prothrombin time test/INR performed so you can take this result to your physician performing the procedure tomorrow.  6. DAY OF  PROCEDURE [facility] September 9th, 2016). NO WARFARIN. NO low-molecular-weight-heparin.   7. Day 1 after procedure [facility] September 10th, 2016)-if your Physician or Surgeon indicates it is appropriate-commence your warfarin the evening after your procedure-using your warfarin dosing instructions last provided by Dr. Elie Confer (1/2 tablet on  Sundays/Wednesdays/Fridays; 1 tablet on all other days). You will also START BACK on the low-molecular-weight-heparin syringe(s) that you should have available. Give your FIRST INJECTION at [facility] on SATURDAY September 10th, 2016.   8. The next day, [facility] September 11th, continue giving yourself the low-molecular-weight-heparin injections at 8:00AM and 8:00PM AND taking your warfarin as instructed on your last instructions provided when you were seen in clinic-( 1/2 tablet on Sundays/Wednesdays/Fridays; 1 tablet on all other days). 9. Return to clinic on MONDAY SEPTEMBER 12th, 2016 at 9:30AM for an appointment in the anticoagulation management clinic with Dr. Elie Confer.  10. [place] [phone]) to speak with either Dr. Elie Confer or Dr. Maudie Mercury.    FOLLOW-UP Return in 10 days (on 11/24/2014) for Follow up confirmatory INR at 0930h.  [name], III Pharm.D., CACP

## 2014-11-15 ENCOUNTER — Telehealth: Payer: Self-pay | Admitting: Internal Medicine

## 2014-11-15 NOTE — Telephone Encounter (Signed)
Call to patient to confirm appointment for 11/16/14 at 10:15 lmtcb

## 2014-11-16 ENCOUNTER — Encounter: Payer: Self-pay | Admitting: Internal Medicine

## 2014-11-16 ENCOUNTER — Ambulatory Visit (INDEPENDENT_AMBULATORY_CARE_PROVIDER_SITE_OTHER): Payer: Medicare Other | Admitting: Internal Medicine

## 2014-11-16 VITALS — BP 93/65 | HR 55 | Temp 97.8°F | Ht 70.0 in | Wt 183.0 lb

## 2014-11-16 DIAGNOSIS — I10 Essential (primary) hypertension: Secondary | ICD-10-CM

## 2014-11-16 DIAGNOSIS — M109 Gout, unspecified: Secondary | ICD-10-CM

## 2014-11-16 DIAGNOSIS — C7B8 Other secondary neuroendocrine tumors: Secondary | ICD-10-CM

## 2014-11-16 DIAGNOSIS — M10071 Idiopathic gout, right ankle and foot: Secondary | ICD-10-CM | POA: Diagnosis present

## 2014-11-16 MED ORDER — LISINOPRIL 20 MG PO TABS: 20.0000 mg | ORAL_TABLET | Freq: Every day | ORAL | Status: DC

## 2014-11-16 NOTE — Progress Notes (Signed)
Patient ID: Jonathan Reed, male   DOB: [birth year], 69 y.o.   MRN: [id number]   Subjective:   Patient ID: Jonathan Reed male   DOB: [birth year] 69 y.o.   MRN: [id number]  HPI: Jonathan Reed is a 69 y.o. male with a PMH of CHF, CAD, HTN, CVA, metastatic malignant neuroendocrine tumor, gout presents to clinic today for routine follow up.   For details of today's visit please refer to problem based charting.  Past Medical History  Diagnosis Date  . CHF (congestive heart failure)     with ICD (f/u with [facility])  . CVA (cerebral infarction) 2011    No residual Sx  . CAD (coronary artery disease) 2000  . HTN (hypertension)   . Nausea alone 01/06/2013  . Abdominal pain, unspecified site 01/06/2013  . Stroke   . Tooth ache 01/04/2014  . Carcinoid syndrome   . Metastatic malignant neuroendocrine tumor to liver   . Metastasis to liver 10/06/2013  . Bronchitis 09/23/2014  . Gout    Current Outpatient Prescriptions  Medication Sig Dispense Refill  . lisinopril (PRINIVIL,ZESTRIL) 20 MG tablet Take 1 tablet (20 mg total) by mouth daily. 30 tablet 11  . metoprolol succinate (TOPROL-XL) 50 MG 24 hr tablet Take 1 tablet (50 mg total) by mouth daily. Take with or immediately following a meal. 30 tablet 2  . omeprazole (PRILOSEC) 40 MG capsule TK ONE C PO  QD  2  . oxyCODONE (OXY IR/ROXICODONE) 5 MG immediate release tablet Take 1 tablet (5 mg total) by mouth every 6 (six) hours as needed for moderate pain or severe pain. 30 tablet 0  . spironolactone (ALDACTONE) 25 MG tablet Take 25 mg by mouth daily.    Marland Kitchen warfarin (COUMADIN) 5 MG tablet Take 1 tablet by mouth daily. Adjust dose as instructed by [facility].  **Patient requests 90 day supply** 90 tablet 3   No current facility-administered medications for this visit.   Family History  Problem Relation Age of Onset  . Stroke Mother   . Diabetes Father     diabetic coma  . Diabetes Sister   . Cancer Sister     intestinal ca?  . Prostate cancer  Brother     prostate  . Breast cancer Maternal Aunt   . Clotting disorder Brother   . Heart disease Brother   . Kidney disease Brother   . Stroke Sister   . Hypertension Daughter    Social History   Social History  . Marital Status: Married    Spouse Name: N/A  . Number of Children: 2  . Years of Education: N/A   Occupational History  .      refurnishing furniture   Social History Main Topics  . Smoking status: Never Smoker   . Smokeless tobacco: Never Used  . Alcohol Use: No  . Drug Use: No  . Sexual Activity: Not Currently   Other Topics Concern  . None   Social History Narrative   Review of Systems: Review of Systems  Constitutional: Negative for fever, chills and malaise/fatigue.  Eyes: Negative for blurred vision.  Respiratory: Negative for shortness of breath.   Cardiovascular: Negative for chest pain and palpitations.  Gastrointestinal: Negative for nausea, vomiting, abdominal pain, diarrhea and constipation.  Musculoskeletal: Positive for joint pain.  Skin: Negative for rash.  Neurological: Negative for dizziness, weakness and headaches.   Objective:  Physical Exam: Filed Vitals:   11/16/14 1044  BP: 93/65  Pulse: 55  Temp: 97.8 F (36.6 C)  TempSrc: Oral  Height: 5\' 10"  (1.778 m)  Weight: 183 lb (83.008 kg)  SpO2: 100%   GENERAL- alert, co-operative, appears as stated age, not in any distress. HEENT- Atraumatic, normocephalic, PERRL, EOMI, oral mucosa appears moist CARDIAC- RRR, no murmurs, rubs or gallops. RESP- Moving equal volumes of air, and clear to auscultation bilaterally, no wheezes or crackles. ABDOMEN- Soft, nontender, no guarding or rebound, bowel sounds present. NEURO- No obvious Cr N abnormality EXTREMITIES- pulse 2+, symmetric, no pedal edema. SKIN- Warm, dry, No rash or lesion. PSYCH- Normal mood and affect, appropriate thought content and speech.  Assessment & Plan:   Case discussed with Dr. Dareen Piano. Please refer to  Problem based carting for further details of today's visit.

## 2014-11-16 NOTE — Patient Instructions (Addendum)
Thank you for coming in today  - Stop taking the Lisinopril-Hydrochlorothiazide pill. I have sent in a new prescription for just the Lisinopril 20 mg daily only.  - Continue to check your BP at home, if you are consistently in the 90s (top number) then give the clinic a call to be seen - If you have flares of Gout, please call in to the clinic to be seen - Follow up with [place] in 1 week for BP recheck  Gout Gout is an inflammatory arthritis caused by a buildup of uric acid crystals in the joints. Uric acid is a chemical that is normally present in the blood. When the level of uric acid in the blood is too high it can form crystals that deposit in your joints and tissues. This causes joint redness, soreness, and swelling (inflammation). Repeat attacks are common. Over time, uric acid crystals can form into masses (tophi) near a joint, destroying bone and causing disfigurement. Gout is treatable and often preventable. CAUSES  The disease begins with elevated levels of uric acid in the blood. Uric acid is produced by your body when it breaks down a naturally found substance called purines. Certain foods you eat, such as meats and fish, contain high amounts of purines. Causes of an elevated uric acid level include:  Being passed down from parent to child (heredity).  Diseases that cause increased uric acid production (such as obesity, psoriasis, and certain cancers).  Excessive alcohol use.  Diet, especially diets rich in meat and seafood.  Medicines, including certain cancer-fighting medicines (chemotherapy), water pills (diuretics), and aspirin.  Chronic kidney disease. The kidneys are no longer able to remove uric acid well.  Problems with metabolism. Conditions strongly associated with gout include:  Obesity.  High blood pressure.  High cholesterol.  Diabetes. Not everyone with elevated uric acid levels gets gout. It is not understood why some people get gout and others do not.  Surgery, joint injury, and eating too much of certain foods are some of the factors that can lead to gout attacks. SYMPTOMS   An attack of gout comes on quickly. It causes intense pain with redness, swelling, and warmth in a joint.  Fever can occur.  Often, only one joint is involved. Certain joints are more commonly involved:  Base of the big toe.  Knee.  Ankle.  Wrist.  Finger. Without treatment, an attack usually goes away in a few days to weeks. Between attacks, you usually will not have symptoms, which is different from many other forms of arthritis. DIAGNOSIS  Your caregiver will suspect gout based on your symptoms and exam. In some cases, tests may be recommended. The tests may include:  Blood tests.  Urine tests.  X-rays.  Joint fluid exam. This exam requires a needle to remove fluid from the joint (arthrocentesis). Using a microscope, gout is confirmed when uric acid crystals are seen in the joint fluid. TREATMENT  There are two phases to gout treatment: treating the sudden onset (acute) attack and preventing attacks (prophylaxis).  Treatment of an Acute Attack.  Medicines are used. These include anti-inflammatory medicines or steroid medicines.  An injection of steroid medicine into the affected joint is sometimes necessary.  The painful joint is rested. Movement can worsen the arthritis.  You may use warm or cold treatments on painful joints, depending which works best for you.  Treatment to Prevent Attacks.  If you suffer from frequent gout attacks, your caregiver may advise preventive medicine. These medicines are started  after the acute attack subsides. These medicines either help your kidneys eliminate uric acid from your body or decrease your uric acid production. You may need to stay on these medicines for a very long time.  The early phase of treatment with preventive medicine can be associated with an increase in acute gout attacks. For this reason,  during the first few months of treatment, your caregiver may also advise you to take medicines usually used for acute gout treatment. Be sure you understand your caregiver's directions. Your caregiver may make several adjustments to your medicine dose before these medicines are effective.  Discuss dietary treatment with your caregiver or dietitian. Alcohol and drinks high in sugar and fructose and foods such as meat, poultry, and seafood can increase uric acid levels. Your caregiver or dietitian can advise you on drinks and foods that should be limited. HOME CARE INSTRUCTIONS   Do not take aspirin to relieve pain. This raises uric acid levels.  Only take over-the-counter or prescription medicines for pain, discomfort, or fever as directed by your caregiver.  Rest the joint as much as possible. When in bed, keep sheets and blankets off painful areas.  Keep the affected joint raised (elevated).  Apply warm or cold treatments to painful joints. Use of warm or cold treatments depends on which works best for you.  Use crutches if the painful joint is in your leg.  Drink enough fluids to keep your urine clear or pale yellow. This helps your body get rid of uric acid. Limit alcohol, sugary drinks, and fructose drinks.  Follow your dietary instructions. Pay careful attention to the amount of protein you eat. Your daily diet should emphasize fruits, vegetables, whole grains, and fat-free or low-fat milk products. Discuss the use of coffee, vitamin C, and cherries with your caregiver or dietitian. These may be helpful in lowering uric acid levels.  Maintain a healthy body weight. SEEK MEDICAL CARE IF:   You develop diarrhea, vomiting, or any side effects from medicines.  You do not feel better in 24 hours, or you are getting worse. SEEK IMMEDIATE MEDICAL CARE IF:   Your joint becomes suddenly more tender, and you have chills or a fever. MAKE SURE YOU:   Understand these instructions.  Will  watch your condition.  Will get help right away if you are not doing well or get worse. Document Released: 03/01/2000 Document Revised: 07/19/2013 Document Reviewed: 10/16/2011 [facility] Patient Information 2015 [place], [place]. This information is not intended to replace advice given to you by your health care provider. Make sure you discuss any questions you have with your health care provider.

## 2014-11-17 DIAGNOSIS — M109 Gout, unspecified: Secondary | ICD-10-CM | POA: Insufficient documentation

## 2014-11-17 NOTE — Assessment & Plan Note (Signed)
Patient reports he had a gout flare 2 weeks ago. He had pain and swelling in his right great toe. Has had attacks in the past after undergoing chemotherapy. Was on prophylactic allopurinol at one point but is no longer on any medications for gout. The gout has resolved and he has no tenderness to palpation and full range of motion of all of his toes. He does not have any swelling or warmth. Does report some lingering tenderness while walking. Says he has flares every six months or so. Does not desire any medications for gout today.

## 2014-11-17 NOTE — Assessment & Plan Note (Signed)
BP Readings from Last 3 Encounters:  11/16/14 93/65  11/07/14 96/65  10/21/14 130/73    Lab Results  Component Value Date   NA 135 11/07/2014   K 4.3 11/07/2014   CREATININE 1.53* 11/07/2014    Assessment: Blood pressure control:   Progress toward BP goal:    Comments: Patient's BP is 93/65 today. On chart review he has had low BP for the past several months. Currently on Lisinopril-HCTZ 10-25 mg, Toprol 50 mg daily and Spironolactone 25 mg daily. Denies any symptoms today. Denies any dizziness or lightheadedness with standing.   'Plan: Medications:  Stop HCTZ, Continue Lisinopril 10 mg daily Educational resources provided:   Self management tools provided:   Other plans: RTC in 1 week for BP check. If BP still low could consider stopping Spironolactone. Would continue Toprol and Lisinopril in setting of CAD/A. Fib.

## 2014-11-17 NOTE — Assessment & Plan Note (Signed)
Patient currently seeing Dr. Reesa Chew for local ablation therapy. Tolerating treatment well. Has third and final treatment at the end of September.

## 2014-11-22 NOTE — Progress Notes (Signed)
Internal Medicine Clinic Attending  I saw and evaluated the patient.  I personally confirmed the key portions of the history and exam documented by Dr. Boswell and I reviewed pertinent patient test results.  The assessment, diagnosis, and plan were formulated together and I agree with the documentation in the resident's note. 

## 2014-11-23 ENCOUNTER — Telehealth: Payer: Self-pay | Admitting: Internal Medicine

## 2014-11-23 NOTE — Telephone Encounter (Signed)
Call to patient to confirm appointment for 11/24/14 at 9:30 and 10:15 lmtcb

## 2014-11-24 ENCOUNTER — Telehealth: Payer: Self-pay | Admitting: *Deleted

## 2014-11-24 ENCOUNTER — Encounter: Payer: Self-pay | Admitting: Internal Medicine

## 2014-11-24 ENCOUNTER — Ambulatory Visit (INDEPENDENT_AMBULATORY_CARE_PROVIDER_SITE_OTHER): Payer: Medicare Other | Admitting: Pharmacist

## 2014-11-24 ENCOUNTER — Ambulatory Visit (INDEPENDENT_AMBULATORY_CARE_PROVIDER_SITE_OTHER): Payer: Medicare Other | Admitting: Internal Medicine

## 2014-11-24 VITALS — BP 112/61 | HR 71 | Temp 98.0°F | Ht 70.0 in | Wt 186.6 lb

## 2014-11-24 DIAGNOSIS — C7B8 Other secondary neuroendocrine tumors: Secondary | ICD-10-CM

## 2014-11-24 DIAGNOSIS — Z8673 Personal history of transient ischemic attack (TIA), and cerebral infarction without residual deficits: Secondary | ICD-10-CM | POA: Diagnosis not present

## 2014-11-24 DIAGNOSIS — I482 Chronic atrial fibrillation, unspecified: Secondary | ICD-10-CM

## 2014-11-24 DIAGNOSIS — Z7901 Long term (current) use of anticoagulants: Secondary | ICD-10-CM | POA: Diagnosis not present

## 2014-11-24 DIAGNOSIS — I63239 Cerebral infarction due to unspecified occlusion or stenosis of unspecified carotid arteries: Secondary | ICD-10-CM

## 2014-11-24 DIAGNOSIS — M1A0710 Idiopathic chronic gout, right ankle and foot, without tophus (tophi): Secondary | ICD-10-CM | POA: Diagnosis not present

## 2014-11-24 DIAGNOSIS — M109 Gout, unspecified: Secondary | ICD-10-CM

## 2014-11-24 DIAGNOSIS — I10 Essential (primary) hypertension: Secondary | ICD-10-CM

## 2014-11-24 LAB — BILIRUBIN, FRACTIONATED(TOT/DIR/INDIR)
BILIRUBIN TOTAL: 0.5 mg/dL (ref 0.3–1.2)
Bilirubin, Direct: <0.1 mg/dL — ABNORMAL LOW (ref 0.1–0.5)

## 2014-11-24 LAB — POCT INR: INR: 2.9

## 2014-11-24 MED ORDER — COLCHICINE 0.6 MG PO TABS: 0.6000 mg | ORAL_TABLET | ORAL | Status: DC | PRN

## 2014-11-24 NOTE — Progress Notes (Signed)
Patient ID: Jonathan Reed, male   DOB: [birth year], 69 y.o.   MRN: [id number]   Subjective:   Patient ID: Jonathan Reed male   DOB: March 06, 1946 69 y.o.   MRN: [id number]  HPI: Jonathan Reed is a 69 y.o. pleasant male with PMH of Metastatic Malignant Neuroendocrine tumor, gout, HTN, CHF, CAD, Chronic A. Fib on warfarin, and CVA who presents for 1 week follow up for blood pressure check.  Patient receiving radioembolization Y90 treatment at [facility] and we are requested by radiology to check bilirubin today to save patient an extra trip.  Patient also has complaint of recent gout flare up beginning about 3 weeks ago which is resolving.  Please see problem list for details.  Past Medical History  Diagnosis Date  . CHF (congestive heart failure)     with ICD (f/u with [facility])  . CVA (cerebral infarction) 2011    No residual Sx  . CAD (coronary artery disease) 2000  . HTN (hypertension)   . Nausea alone 01/06/2013  . Abdominal pain, unspecified site 01/06/2013  . Stroke   . Tooth ache 01/04/2014  . Carcinoid syndrome   . Metastatic malignant neuroendocrine tumor to liver   . Metastasis to liver 10/06/2013  . Bronchitis 09/23/2014  . Gout    Current Outpatient Prescriptions  Medication Sig Dispense Refill  . colchicine (COLCRYS) 0.6 MG tablet Take 1 tablet (0.6 mg total) by mouth as needed (For acute gout flare up.). Take 2 tabs (1.2 mg) then 1 tab (0.6 mg) one hour later. 15 tablet 0  . lisinopril (PRINIVIL,ZESTRIL) 20 MG tablet Take 1 tablet (20 mg total) by mouth daily. 30 tablet 11  . metoprolol succinate (TOPROL-XL) 50 MG 24 hr tablet Take 1 tablet (50 mg total) by mouth daily. Take with or immediately following a meal. 30 tablet 2  . omeprazole (PRILOSEC) 40 MG capsule TK ONE C PO  QD  2  . oxyCODONE (OXY IR/ROXICODONE) 5 MG immediate release tablet Take 1 tablet (5 mg total) by mouth every 6 (six) hours as needed for moderate pain or severe pain. 30 tablet 0  . spironolactone  (ALDACTONE) 25 MG tablet Take 25 mg by mouth daily.    Marland Kitchen warfarin (COUMADIN) 5 MG tablet Take 1 tablet by mouth daily. Adjust dose as instructed by [facility].  **Patient requests 90 day supply** 90 tablet 3   No current facility-administered medications for this visit.   Family History  Problem Relation Age of Onset  . Stroke Mother   . Diabetes Father     diabetic coma  . Diabetes Sister   . Cancer Sister     intestinal ca?  . Prostate cancer Brother     prostate  . Breast cancer Maternal Aunt   . Clotting disorder Brother   . Heart disease Brother   . Kidney disease Brother   . Stroke Sister   . Hypertension Daughter    Social History   Social History  . Marital Status: Married    Spouse Name: N/A  . Number of Children: 2  . Years of Education: N/A   Occupational History  .      refurnishing furniture   Social History Main Topics  . Smoking status: Never Smoker   . Smokeless tobacco: Never Used  . Alcohol Use: No  . Drug Use: No  . Sexual Activity: Not Currently   Other Topics Concern  . None   Social History Narrative   Review  of Systems: Review of Systems  Constitutional: Negative for fever and chills.  Respiratory: Negative for cough, shortness of breath and wheezing.   Cardiovascular: Negative for chest pain, palpitations and leg swelling.  Gastrointestinal: Negative for nausea, vomiting, abdominal pain, diarrhea, constipation and blood in stool.  Genitourinary: Negative for dysuria and hematuria.  Musculoskeletal:       Positive joint pain right first and second toes.  Neurological: Negative for dizziness and headaches.    Objective:  Physical Exam: Filed Vitals:   11/24/14 1003  BP: 112/61  Pulse: 71  Temp: 98 F (36.7 C)  TempSrc: Oral  Height: 5\' 10"  (1.778 m)  Weight: 186 lb 9.6 oz (84.641 kg)  SpO2: 100%   Physical Exam  Constitutional: He is oriented to person, place, and time. He appears well-developed and well-nourished.    Pleasant, NAD.  HENT:  Head: Normocephalic and atraumatic.  Cardiovascular: Normal rate and intact distal pulses.  An irregularly irregular rhythm present.  No murmur heard. Pulmonary/Chest: Effort normal and breath sounds normal. No respiratory distress. He has no wheezes. He has no rales.  Abdominal: Soft. There is no tenderness.  Musculoskeletal: Normal range of motion. He exhibits no edema or tenderness.  Very slight swelling of right 1st and 2nd toes compared to left. Non tender to palpation.  Neurological: He is alert and oriented to person, place, and time.  Skin: Skin is warm.  Psychiatric: He has a normal mood and affect.    Assessment & Plan:  Please see problem-based charting for assessment and plan.

## 2014-11-24 NOTE — Assessment & Plan Note (Addendum)
Patient reports gout flare up 3 weeks ago that is nearly resolved to his usual health. He had pain in his right 1st and 2nd toes. He says his first gout attack was about 2 years ago around the timeline when he received chemotherapy. Was also on HCTZ until discontinued last visit which may have contributed. Patient does not want to take daily allopurinol for prevention but is agreeable to starting colchicine for acute gout flare ups. His gout has resolved and there is only very minimal swelling of his 1st and 2nd right toes when compared to the left. No warmth or tenderness to palpation, ROM intact.  -Start Colchicine 0.6 mg tab prn for acute gout flare up. Can take 2 tabs at onset followed by 1 tab one hour afterwards. -Follow up with PCP in 1-2 months. Consider allopurinol if patient agreeable and uric acid check.

## 2014-11-24 NOTE — Patient Instructions (Addendum)
It was a pleasure to meet you Jonathan Reed.  We will draw some blood today to check your bilirubin as requested from [facility] so you do not have to make an extra trip.  I have prescribed you Colchicine (Colcrys) that you can take if you have an acute gout flare up. You may take 2 tablets (1.2 mg total) then 1 tablet (0.6 mg) one hour later.  Your blood pressure is improved today at 112/61, we will not make any changes to your blood pressure medication at this time.  Please schedule a follow up in 2 months.

## 2014-11-24 NOTE — Telephone Encounter (Signed)
Ordered

## 2014-11-24 NOTE — Progress Notes (Signed)
Anti-Coagulation Progress Note  Jonathan Reed is a 69 y.o. male who is currently on an anti-coagulation regimen.    RECENT RESULTS: Recent results are below, the most recent result is correlated with a dose of 27.5 mg. per week: Lab Results  Component Value Date   INR 2.90 11/24/2014   INR 2.10 11/14/2014   INR 1.28 11/07/2014   PROTIME 30.0* 05/15/2012    ANTI-COAG DOSE: Anticoagulation Dose Instructions as of 11/24/2014      Sun Mon Tue Wed Thu Fri Sat   New Dose 2.5 mg 5 mg 2.5 mg 5 mg 2.5 mg 5 mg 2.5 mg       ANTICOAG SUMMARY: Anticoagulation Episode Summary    Current INR goal 2.0-3.0  Next INR check 12/04/2014  INR from last check 2.90 (11/24/2014)  Weekly max dose   Target end date Indefinite  INR check location [facility]  Preferred lab   Send INR reminders to    Indications  Long term (current) use of anticoagulants [Z79.01] CVA (cerebral infarction) [I63.9] Chronic atrial fibrillation [I48.2]        Comments         ANTICOAG TODAY: Anticoagulation Summary as of 11/24/2014    INR goal 2.0-3.0  Selected INR 2.90 (11/24/2014)  Next INR check 12/04/2014  Target end date Indefinite   Indications  Long term (current) use of anticoagulants [Z79.01] CVA (cerebral infarction) [I63.9] Chronic atrial fibrillation [I48.2]      Anticoagulation Episode Summary    INR check location [facility]   Preferred lab    Send INR reminders to    Comments       PATIENT INSTRUCTIONS: Patient Instructions  Patient instructed to take medications as defined in the Anti-coagulation Track section of this encounter.  Patient instructed to take today's dose.  Patient verbalized understanding of these instructions.  Patient provided the following bridging instructions (he has been provided the LMWH):  Bridging Instructions 1. Five (5) days BEFORE your planned procedure-STOP taking your warfarin/Coumadin (LAST DOSE of warfarin will be [facility] September 14th, 2016) 2.  Four (4) days BEFORE your planned procedure [facility] September 15th, 2016-do NOT take warfarin/Coumadin. 3. Three (3) days BEFORE your planned procedure [facility] September 16th, 2016-do NOT take warfarin/Coumadin.  On this day (FRIDAY September 16th, 2016)-you will begin giving yourself the low-molecular weight heparin by syringe that you should have available. Give the first injection at 8:00PM.   4. Two (2) days BEFORE your planned procedure [facility] September 17th, 2016)-do NOT take warfarin/Coumadin. Continue with low-molecular weight heparin injections at 8:00AM and 8:00PM.    5. One (1) day BEFORE your planned procedure [facility] September 18th, 2016)-do NOT take warfarin/Coumadin. Give this injection at 8:00AM. This is your LAST DOSE BEFORE YOUR PROCEDURE tomorrow.  COME TO CLINIC ON SUNDAY SEPTEMBER 18th at 10:00am to have your prothrombin time test/INR performed so you can take this result to your physician performing the procedure tomorrow.  6. DAY OF PROCEDURE (MONDAY September 19th, 2016). NO WARFARIN. NO low-molecular-weight-heparin.   7. Day 1 after procedure [facility] September 20th, 2016)-if your Physician or Surgeon indicates it is appropriate-commence your warfarin the evening after your procedure-using your warfarin dosing instructions last provided by Dr. Elie Confer (1/2 tablet on Sundays/Tuesdays/Thursdays/Saturdays; 1 tablet on all other days). You will also START BACK on the low-molecular-weight-heparin syringe(s) that you should have available. Give your FIRST INJECTION at [facility] on TUESDAY September 20th, 2016.   8. The next day, [facility] September 21st continue giving yourself the  low-molecular-weight-heparin injections at 8:00AM and 8:00PM AND taking your warfarin as instructed on your last instructions provided. 9. Return to clinic on MONDAY SEPTEMBER 26th, 2016 at 9:30AM for an appointment in the anticoagulation management clinic with Dr. Elie Confer.  10. [place] [phone]) to speak with either Dr. Elie Confer or Dr. Maudie Mercury.      FOLLOW-UP Return in about 10 days (around 12/04/2014) for Confirming INR for procedure on 19-SEP-16.  [name], III Pharm.D., CACP

## 2014-11-24 NOTE — Telephone Encounter (Signed)
wlong int at int. Radiology calls and needs a bilirubin drawn today when pt comes for his INR as ordered by Dr Elie Confer, he is having a Y90 and this will save him a trip, could you please put the order in?

## 2014-11-24 NOTE — Patient Instructions (Signed)
Patient instructed to take medications as defined in the Anti-coagulation Track section of this encounter.  Patient instructed to take today's dose.  Patient verbalized understanding of these instructions.  Patient provided the following bridging instructions (he has been provided the LMWH):  Bridging Instructions 1. Five (5) days BEFORE your planned procedure-STOP taking your warfarin/Coumadin (LAST DOSE of warfarin will be [facility] September 14th, 2016) 2. Four (4) days BEFORE your planned procedure [facility] September 15th, 2016-do NOT take warfarin/Coumadin. 3. Three (3) days BEFORE your planned procedure [facility] September 16th, 2016-do NOT take warfarin/Coumadin.  On this day (FRIDAY September 16th, 2016)-you will begin giving yourself the low-molecular weight heparin by syringe that you should have available. Give the first injection at 8:00PM.   4. Two (2) days BEFORE your planned procedure [facility] September 17th, 2016)-do NOT take warfarin/Coumadin. Continue with low-molecular weight heparin injections at 8:00AM and 8:00PM.    5. One (1) day BEFORE your planned procedure [facility] September 18th, 2016)-do NOT take warfarin/Coumadin. Give this injection at 8:00AM. This is your LAST DOSE BEFORE YOUR PROCEDURE tomorrow.  COME TO CLINIC ON SUNDAY SEPTEMBER 18th at 10:00am to have your prothrombin time test/INR performed so you can take this result to your physician performing the procedure tomorrow.  6. DAY OF PROCEDURE (MONDAY September 19th, 2016). NO WARFARIN. NO low-molecular-weight-heparin.   7. Day 1 after procedure [facility] September 20th, 2016)-if your Physician or Surgeon indicates it is appropriate-commence your warfarin the evening after your procedure-using your warfarin dosing instructions last provided by Dr. Elie Confer (1/2 tablet on Sundays/Tuesdays/Thursdays/Saturdays; 1 tablet on all other days). You will also START BACK on the low-molecular-weight-heparin syringe(s) that you should  have available. Give your FIRST INJECTION at [facility] on TUESDAY September 20th, 2016.   8. The next day, [facility] September 21st continue giving yourself the low-molecular-weight-heparin injections at 8:00AM and 8:00PM AND taking your warfarin as instructed on your last instructions provided. 9. Return to clinic on MONDAY SEPTEMBER 26th, 2016 at 9:30AM for an appointment in the anticoagulation management clinic with Dr. Elie Confer.  10. [place] [phone]) to speak with either Dr. Elie Confer or Dr. Maudie Mercury.

## 2014-11-24 NOTE — Addendum Note (Signed)
Addended by: Truddie Crumble on: 11/24/2014 11:12 AM   Modules accepted: Orders

## 2014-11-24 NOTE — Assessment & Plan Note (Signed)
BP Readings from Last 3 Encounters:  11/24/14 112/61  11/16/14 93/65  11/07/14 96/65   Blood pressure improved at 112/61 after discontinuation of combination Lisinopril-HCTZ 10-25 mg. Patient started on Lisinopril 20 mg daily, continued Toprol 50 mg 2 tabs daily, and Spirinolactone 25 mg daily. Denies any dizziness or lightheadedness or other concerning symptoms. -Continue Lisinopril 20 mg, Toprol 50 mg, and Spirinolactone 25 mg -Follow up with PCP in 1-2 months

## 2014-11-24 NOTE — Assessment & Plan Note (Signed)
Patient follows Dr. Reesa Chew for radioembolation Y90 therapy. Next treatment on 12/05/14. -Check bilirubin fractionated (tot/dir/ind) today to save patient extra trip as request per radiology

## 2014-11-25 NOTE — Progress Notes (Signed)
This encounter was created in error - please disregard.

## 2014-11-29 NOTE — Progress Notes (Signed)
Internal Medicine Clinic Attending  I saw and evaluated the patient.  I personally confirmed the key portions of the history and exam documented by Dr. Patel,Vishal and I reviewed pertinent patient test results.  The assessment, diagnosis, and plan were formulated together and I agree with the documentation in the resident's note.  

## 2014-11-29 NOTE — Progress Notes (Signed)
I have reviewed Dr. Gladstone Pih note.  Patient on [facility] for Afib.  INR at goal.

## 2014-12-02 ENCOUNTER — Other Ambulatory Visit: Payer: Self-pay | Admitting: Radiology

## 2014-12-04 ENCOUNTER — Encounter: Payer: Self-pay | Admitting: Pharmacist

## 2014-12-04 ENCOUNTER — Telehealth: Payer: Self-pay | Admitting: Pharmacist

## 2014-12-04 DIAGNOSIS — Z7901 Long term (current) use of anticoagulants: Secondary | ICD-10-CM

## 2014-12-04 NOTE — Telephone Encounter (Signed)
Met patient at [facility] Sunday18-Sep-16 at 1000h for confirming INR in advance of IR procedure scheduled for Monday 19-SEP-16. INR = 1.3 Ready for procedure tommorrow.

## 2014-12-05 ENCOUNTER — Ambulatory Visit (HOSPITAL_COMMUNITY)
Admission: RE | Admit: 2014-12-05 | Discharge: 2014-12-05 | Disposition: A | Discharge status: Home / Self Care | Payer: Medicare Other | Source: Ambulatory Visit | Attending: Interventional Radiology | Admitting: Interventional Radiology

## 2014-12-05 ENCOUNTER — Encounter (HOSPITAL_COMMUNITY): Admission: RE | Admit: 2014-12-05 | Payer: Medicare Other | Source: Ambulatory Visit

## 2014-12-05 ENCOUNTER — Encounter (HOSPITAL_COMMUNITY)
Admission: RE | Admit: 2014-12-05 | Discharge: 2014-12-05 | Disposition: A | Discharge status: Home / Self Care | Payer: Medicare Other | Source: Ambulatory Visit | Attending: Interventional Radiology | Admitting: Interventional Radiology

## 2014-12-05 ENCOUNTER — Encounter (HOSPITAL_COMMUNITY): Payer: Self-pay

## 2014-12-05 ENCOUNTER — Other Ambulatory Visit: Payer: Self-pay | Admitting: Interventional Radiology

## 2014-12-05 ENCOUNTER — Other Ambulatory Visit: Payer: Self-pay | Admitting: Radiology

## 2014-12-05 ENCOUNTER — Other Ambulatory Visit (HOSPITAL_COMMUNITY): Payer: Self-pay | Admitting: Interventional Radiology

## 2014-12-05 DIAGNOSIS — C7B8 Other secondary neuroendocrine tumors: Secondary | ICD-10-CM | POA: Diagnosis not present

## 2014-12-05 DIAGNOSIS — C7A8 Other malignant neuroendocrine tumors: Secondary | ICD-10-CM

## 2014-12-05 DIAGNOSIS — Z7901 Long term (current) use of anticoagulants: Secondary | ICD-10-CM | POA: Diagnosis not present

## 2014-12-05 DIAGNOSIS — Z880 Allergy status to penicillin: Secondary | ICD-10-CM | POA: Diagnosis not present

## 2014-12-05 DIAGNOSIS — M109 Gout, unspecified: Secondary | ICD-10-CM | POA: Insufficient documentation

## 2014-12-05 DIAGNOSIS — Z79899 Other long term (current) drug therapy: Secondary | ICD-10-CM | POA: Diagnosis not present

## 2014-12-05 LAB — CBC WITH DIFFERENTIAL/PLATELET
BASOS ABS: 0 10*3/uL (ref 0.0–0.1)
Basophils Relative: 0 %
EOS ABS: 0 10*3/uL (ref 0.0–0.7)
EOS PCT: 0 %
HCT: 38.5 % — ABNORMAL LOW (ref 39.0–52.0)
Hemoglobin: 13.4 g/dL (ref 13.0–17.0)
LYMPHS ABS: 1.2 10*3/uL (ref 0.7–4.0)
Lymphocytes Relative: 13 %
MCH: 31.8 pg (ref 26.0–34.0)
MCHC: 34.8 g/dL (ref 30.0–36.0)
MCV: 91.2 fL (ref 78.0–100.0)
MONO ABS: 1.3 10*3/uL — AB (ref 0.1–1.0)
Monocytes Relative: 14 %
NEUTROS PCT: 73 %
Neutro Abs: 6.6 10*3/uL (ref 1.7–7.7)
PLATELETS: 227 10*3/uL (ref 150–400)
RBC: 4.22 MIL/uL (ref 4.22–5.81)
RDW: 15.1 % (ref 11.5–15.5)
WBC: 9.1 10*3/uL (ref 4.0–10.5)

## 2014-12-05 LAB — COMPREHENSIVE METABOLIC PANEL
ALBUMIN: 3.4 g/dL — AB (ref 3.5–5.0)
ALT: 26 U/L (ref 17–63)
AST: 32 U/L (ref 15–41)
Alkaline Phosphatase: 140 U/L — ABNORMAL HIGH (ref 38–126)
Anion gap: 10 (ref 5–15)
BUN: 16 mg/dL (ref 6–20)
CHLORIDE: 104 mmol/L (ref 101–111)
CO2: 23 mmol/L (ref 22–32)
CREATININE: 1.32 mg/dL — AB (ref 0.61–1.24)
Calcium: 9.6 mg/dL (ref 8.9–10.3)
GFR calc Af Amer: >60 mL/min (ref >60)
GFR calc non Af Amer: 55 mL/min — ABNORMAL LOW (ref >60)
Glucose, Bld: 119 mg/dL — ABNORMAL HIGH (ref 65–99)
POTASSIUM: 4 mmol/L (ref 3.5–5.1)
SODIUM: 137 mmol/L (ref 135–145)
Total Bilirubin: 0.9 mg/dL (ref 0.3–1.2)
Total Protein: 7.9 g/dL (ref 6.5–8.1)

## 2014-12-05 LAB — PROTIME-INR
INR: 1.21 (ref 0.00–1.49)
PROTHROMBIN TIME: 15.5 s — AB (ref 11.6–15.2)

## 2014-12-05 LAB — APTT: APTT: 37 s (ref 24–37)

## 2014-12-05 MED ORDER — PIPERACILLIN-TAZOBACTAM 3.375 G IVPB
3.3750 g | Freq: Once | INTRAVENOUS | Status: AC
  Administered 2014-12-05: 3.375 g via INTRAVENOUS
  Filled 2014-12-05: qty 50

## 2014-12-05 MED ORDER — DEXTROSE 5 % IV SOLN
2.0000 g | Freq: Once | INTRAVENOUS | Status: DC
  Filled 2014-12-05: qty 2

## 2014-12-05 MED ORDER — SODIUM CHLORIDE 0.9 % IV SOLN
INTRAVENOUS | Status: DC
Start: 2014-12-05 — End: 2014-12-06

## 2014-12-05 MED ORDER — FENTANYL CITRATE (PF) 100 MCG/2ML IJ SOLN
INTRAMUSCULAR | Status: AC
  Filled 2014-12-05: qty 2

## 2014-12-05 MED ORDER — MIDAZOLAM HCL 2 MG/2ML IJ SOLN
INTRAMUSCULAR | Status: AC | PRN
  Administered 2014-12-05: 0.5 mg via INTRAVENOUS
  Administered 2014-12-05: 1 mg via INTRAVENOUS
  Administered 2014-12-05: 0.5 mg via INTRAVENOUS

## 2014-12-05 MED ORDER — MIDAZOLAM HCL 2 MG/2ML IJ SOLN
INTRAMUSCULAR | Status: AC
  Filled 2014-12-05: qty 4

## 2014-12-05 MED ORDER — METRONIDAZOLE IN NACL 5-0.79 MG/ML-% IV SOLN
500.0000 mg | Freq: Once | INTRAVENOUS | Status: DC
  Filled 2014-12-05: qty 100

## 2014-12-05 MED ORDER — SODIUM CHLORIDE 0.9 % IV SOLN: Freq: Once | INTRAVENOUS | Status: DC

## 2014-12-05 MED ORDER — FENTANYL CITRATE (PF) 100 MCG/2ML IJ SOLN
INTRAMUSCULAR | Status: AC | PRN
  Administered 2014-12-05 (×2): 25 ug via INTRAVENOUS

## 2014-12-05 MED ORDER — OCTREOTIDE ACETATE 100 MCG/ML IJ SOLN
200.0000 ug | Freq: Once | INTRAMUSCULAR | Status: AC
  Administered 2014-12-05: 200 ug via INTRAVENOUS
  Filled 2014-12-05: qty 2

## 2014-12-05 MED ORDER — SODIUM CHLORIDE 0.9 % IV SOLN
Freq: Once | INTRAVENOUS | Status: AC
  Administered 2014-12-05: 08:00:00 via INTRAVENOUS

## 2014-12-05 MED ORDER — DEXAMETHASONE SODIUM PHOSPHATE 10 MG/ML IJ SOLN
20.0000 mg | Freq: Once | INTRAMUSCULAR | Status: AC
  Administered 2014-12-05: 20 mg via INTRAVENOUS
  Filled 2014-12-05: qty 2

## 2014-12-05 MED ORDER — IOHEXOL 300 MG/ML  SOLN
65.0000 mL | Freq: Once | INTRAMUSCULAR | Status: AC | PRN
  Administered 2014-12-05: 65 mL

## 2014-12-05 MED ORDER — SODIUM CHLORIDE 0.9 % IV SOLN
8.0000 mg | Freq: Once | INTRAVENOUS | Status: AC
  Administered 2014-12-05: 8 mg via INTRAVENOUS
  Filled 2014-12-05: qty 4

## 2014-12-05 MED ORDER — PANTOPRAZOLE SODIUM 40 MG IV SOLR
40.0000 mg | Freq: Once | INTRAVENOUS | Status: AC
  Administered 2014-12-05: 40 mg via INTRAVENOUS
  Filled 2014-12-05: qty 40

## 2014-12-05 MED ORDER — OCTREOTIDE ACETATE 100 MCG/ML IJ SOLN
200.0000 ug | Freq: Once | INTRAMUSCULAR | Status: DC
  Filled 2014-12-05: qty 2

## 2014-12-05 MED ORDER — LIDOCAINE HCL 1 % IJ SOLN
INTRAMUSCULAR | Status: AC
  Filled 2014-12-05: qty 20

## 2014-12-05 NOTE — Procedures (Signed)
Successful LEFT hepatic lobe Y90 radioembolization No comp Stable Full report in PACS

## 2014-12-05 NOTE — Discharge Instructions (Signed)
Refer to this sheet in the next few weeks. These instructions provide you with information on caring for yourself after your procedure. Your health care provider may also give you more specific instructions. Your treatment has been planned according to current medical practices, but problems sometimes occur. Call your health care provider if you have any problems or questions after your procedure.  WHAT TO EXPECT AFTER THE PROCEDURE After your procedure, it is typical to have the following sensations:  Minor discomfort or tenderness and a small bump at the catheter insertion site. The bump should usually decrease in size and tenderness within 1 to 2 weeks.  Any bruising will usually fade within 2 to 4 weeks. HOME CARE INSTRUCTIONS   You may need to keep taking blood thinners if they were prescribed for you. Take medicines only as directed by your health care provider.  Do not apply powder or lotion to the site.  Do not take baths, swim, or use a hot tub until your health care provider approves.  You may shower 24 hours after the procedure. Remove the bandage (dressing) and gently wash the site with plain soap and water. Gently pat the site dry.  Inspect the site at least twice daily.  Limit your activity for the first 48 hours. Do not bend, squat, or lift anything over 20 lb (9 kg) or as directed by your health care provider.  Plan to have someone take you home after the procedure. Follow instructions about when you can drive or return to work. SEEK MEDICAL CARE IF:  You get light-headed when standing up.  You have drainage (other than a small amount of blood on the dressing).  You have chills.  You have a fever.  You have redness, warmth, swelling, or pain at the insertion site. SEEK IMMEDIATE MEDICAL CARE IF:   You develop chest pain or shortness of breath, feel faint, or pass out.  You have bleeding, swelling larger than a walnut, or drainage from the catheter insertion  site.  You develop pain, discoloration, coldness, or severe bruising in the leg or arm that held the catheter.  You develop bleeding from any other place, such as the bowels. You may see bright red blood in your urine or stools, or your stools may appear black and tarry.  You have heavy bleeding from the site. If this happens, hold pressure on the site. MAKE SURE YOU:  Understand these instructions.  Will watch your condition.  Will get help right away if you are not doing well or get worse. Document Released: 09/20/2004 Document Revised: 07/19/2013 Document Reviewed: 07/27/2012 [facility] Patient Information 2015 [place], [place]. This information is not intended to replace advice given to you by your health care provider. Make sure you discuss any questions you have with your health care provider.  Post Y-90 Radioembolization Discharge Instructions  You have been given a radioactive material during your procedure.  While it is safe for you to be discharged home from the hospital, you need to proceed directly home.    Do not use public transportation, including air travel, lasting more than 2 hours for 1 week.  Avoid crowded public places for 1 week.  Adult visitors should try to avoid close contact with you for 1 week.    Children and pregnant females should not visit or have close contact with you for 1 week.  Items that you touch are not radioactive.  Do not sleep in the same bed as your partner for 1 week,  and a condom should be used for sexual activity during the first 24 hours.  Your blood may be radioactive and caution should be used if any bleeding occurs during the recovery period.  Body fluids may be radioactive for 24 hours.  Wash your hands after voiding.  Men should sit to urinate.  Dispose of any soiled materials (flush down toilet or place in trash at home) during the first day.  Drink 6 to 8 glasses of fluids per day for 5 days to hydrate yourself.  If you need to  see a doctor during the first week, you must let them know that you were treated with yttrium-90 microspheres, and will be slightly radioactive.  They can call Interventional Radiology [phone] with any questions Conscious Sedation, Adult, Care After Refer to this sheet in the next few weeks. These instructions provide you with information on caring for yourself after your procedure. Your health care provider may also give you more specific instructions. Your treatment has been planned according to current medical practices, but problems sometimes occur. Call your health care provider if you have any problems or questions after your procedure. WHAT TO EXPECT AFTER THE PROCEDURE  After your procedure:  You may feel sleepy, clumsy, and have poor balance for several hours.  Vomiting may occur if you eat too soon after the procedure. HOME CARE INSTRUCTIONS  Do not participate in any activities where you could become injured for at least 24 hours. Do not:  Drive.  Swim.  Ride a bicycle.  Operate heavy machinery.  Cook.  Use power tools.  Climb ladders.  Work from a high place.  Do not make important decisions or sign legal documents until you are improved.  If you vomit, drink water, juice, or soup when you can drink without vomiting. Make sure you have little or no nausea before eating solid foods.  Only take over-the-counter or prescription medicines for pain, discomfort, or fever as directed by your health care provider.  Make sure you and your family fully understand everything about the medicines given to you, including what side effects may occur.  You should not drink alcohol, take sleeping pills, or take medicines that cause drowsiness for at least 24 hours.  If you smoke, do not smoke without supervision.  If you are feeling better, you may resume normal activities 24 hours after you were sedated.  Keep all appointments with your health care provider. SEEK MEDICAL CARE  IF:  Your skin is pale or bluish in color.  You continue to feel nauseous or vomit.  Your pain is getting worse and is not helped by medicine.  You have bleeding or swelling.  You are still sleepy or feeling clumsy after 24 hours. SEEK IMMEDIATE MEDICAL CARE IF:  You develop a rash.  You have difficulty breathing.  You develop any type of allergic problem.  You have a fever. MAKE SURE YOU:  Understand these instructions.  Will watch your condition.  Will get help right away if you are not doing well or get worse. Document Released: 12/23/2012 Document Reviewed: 12/23/2012 [facility] Patient Information 2015 [place], [place]. This information is not intended to replace advice given to you by your health care provider. Make sure you discuss any questions you have with your health care provider.

## 2014-12-05 NOTE — Sedation Documentation (Signed)
5Fr sheath removed from right fem artery by Dr. Annamaria Boots. Hemostasis achieved used exoseal closure device and manual pressure held by Lambert Mody, RTR for 2 minutes. RDP +2, groin level 0.

## 2014-12-05 NOTE — Progress Notes (Signed)
Patient ID: Jonathan Reed, male   DOB: 1945-11-23, 69 y.o.   MRN: [id number]    Referring Physician(s): Gorsuch,N  Chief Complaint: Metastatic neuroendocrine tumor to liver   Subjective: Pt familiar to IR service from recent celiac and right hepatic arteriograms/right hepatic artery Y-90 radioembolization on 11/07/14. He has known metastatic neuroendocrine tumor to the liver with recent progression of tumor burden . He presents today for left hepatic Y 90 radial embolization. He currently denies fevers, chills, headaches, chest pain, dyspnea, abdominal, back pain, nausea vomiting or abnormal bleeding. He has had recent flareup of gout in right first and second toes.   Allergies: Penicillins  Medications: Prior to Admission medications   Medication Sig Start Date End Date Taking? Authorizing Provider  colchicine (COLCRYS) 0.6 MG tablet Take 1 tablet (0.6 mg total) by mouth as needed (For acute gout flare up.). Take 2 tabs (1.2 mg) then 1 tab (0.6 mg) one hour later. 11/24/14   Zada Finders, MD  Enoxaparin Sodium (LOVENOX IJ) Inject as directed 2 (two) times daily.     Historical Provider, MD  lisinopril (PRINIVIL,ZESTRIL) 20 MG tablet Take 1 tablet (20 mg total) by mouth daily. 11/16/14 11/16/15  Maryellen Pile, MD  metoprolol succinate (TOPROL-XL) 50 MG 24 hr tablet Take 1 tablet (50 mg total) by mouth daily. Take with or immediately following a meal. 06/29/13   Blain Pais, MD  naproxen sodium (ANAPROX) 220 MG tablet Take 220 mg by mouth 2 (two) times daily with a meal.    Historical Provider, MD  omeprazole (PRILOSEC) 20 MG capsule Take 20 mg by mouth daily.    Historical Provider, MD  oxyCODONE (OXY IR/ROXICODONE) 5 MG immediate release tablet Take 1 tablet (5 mg total) by mouth every 6 (six) hours as needed for moderate pain or severe pain. 09/18/14   Jaynee Eagles, PA-C  spironolactone (ALDACTONE) 25 MG tablet Take 25 mg by mouth daily.    Historical Provider, MD  warfarin (COUMADIN) 5  MG tablet Take 5 mg by mouth daily.    Historical Provider, MD     Vital Signs: BP 107/72 mmHg  Pulse 89  Temp(Src) 98 F (36.7 C) (Oral)  Resp 18  SpO2 100%  Physical Exam patient awake, alert. Chest clear to auscultation. Heart with irregularly irregular rhythm. Abdomen soft, positive bowel sounds, nontender. Extremities- full range of motion and no edema ;some joint tenderness in first and second toes on the right  Imaging: No results found.  Labs:  CBC:  Recent Labs  09/07/14 0811 09/18/14 0856 10/21/14 0732 11/07/14 0810 12/05/14 0755  WBC 6.3 10.4* 5.3 6.4 9.1  HGB 13.7 13.9* 14.1 13.2 13.4  HCT 41.1 42.6* 42.0 40.1 38.5*  PLT 146  --  177 319 227    COAGS:  Recent Labs  10/21/14 0732  11/07/14 0810 11/14/14 1049 11/24/14 1045 12/05/14 0755  INR 1.10  < > 1.28 2.10 2.90 1.21  APTT 32  --  33  --   --  37  < > = values in this interval not displayed.  BMP:  Recent Labs  09/18/14 0952 10/21/14 0732 11/07/14 0810 12/05/14 0755  NA 136 137 135 137  K 4.0 3.8 4.3 4.0  CL 98 105 103 104  CO2 24 24 27 23   GLUCOSE 115* 105* 111* 119*  BUN 22 21* 28* 16  CALCIUM 9.0 9.3 9.4 9.6  CREATININE 1.45* 1.51* 1.53* 1.32*  GFRNONAA  --  46* 46* 55*  GFRAA  --  54* 53* >60    LIVER FUNCTION TESTS:  Recent Labs  09/18/14 0952 10/21/14 0732 11/07/14 0810 11/24/14 1105 12/05/14 0755  BILITOT 0.6 0.7 0.5 0.5 0.9  AST 14 31 54*  --  32  ALT 16 30 61  --  26  ALKPHOS 136* 115 227*  --  140*  PROT 7.0 7.4 7.4  --  7.9  ALBUMIN 3.9 3.9 3.4*  --  3.4*    Assessment and Plan: Patient with known metastatic neuroendocrine tumor to the liver and recent progression, status post right hepatic Y 90 radio embolization on 11/07/14. He presents today for left hepatic Y 90 radio embolization. Details/risks of procedure, including but not limited to, internal bleeding, infection, renal injury, nontarget embolization, discussed with patient and family with their  understanding and consent. Patient's creatinine today is 1.32.   Signed: D. Rowe Robert 12/05/2014, 9:00 AM   I spent a total of 15 minutes at the the patient's bedside AND on the patient's hospital floor or unit, greater than 50% of which was counseling/coordinating care for hepatic Y 90 radio embolization

## 2014-12-12 ENCOUNTER — Ambulatory Visit (INDEPENDENT_AMBULATORY_CARE_PROVIDER_SITE_OTHER): Payer: Medicare Other | Admitting: Pharmacist

## 2014-12-12 DIAGNOSIS — I482 Chronic atrial fibrillation, unspecified: Secondary | ICD-10-CM

## 2014-12-12 DIAGNOSIS — I63239 Cerebral infarction due to unspecified occlusion or stenosis of unspecified carotid arteries: Secondary | ICD-10-CM

## 2014-12-12 DIAGNOSIS — Z7901 Long term (current) use of anticoagulants: Secondary | ICD-10-CM

## 2014-12-12 DIAGNOSIS — Z8673 Personal history of transient ischemic attack (TIA), and cerebral infarction without residual deficits: Secondary | ICD-10-CM

## 2014-12-12 LAB — POCT INR: INR: 1.3

## 2014-12-12 NOTE — Patient Instructions (Signed)
Patient instructed to take medications as defined in the Anti-coagulation Track section of this encounter.  Patient instructed to take today's dose.  Patient instructed to use enoxaparin/Lovenox 120mg  (1.5mg /kg) subcutaneously administered once daily to an area 2 inches away from your "belly-button", alternating sites left-right-left, etc.  Patient verbalized understanding of these instructions.

## 2014-12-12 NOTE — Progress Notes (Signed)
Anti-Coagulation Progress Note  Jonathan Reed is a 69 y.o. male who is currently on an anti-coagulation regimen.    RECENT RESULTS: Recent results are below, the most recent result is correlated with a dose of 25mg . per week:  Am increasing to 30mg  per week and bridging with enoxaparin 1.5mg /kg (120mg ) SQ q24h until next INR. Syringes were provided to patient.  Lab Results  Component Value Date   INR 1.30 12/12/2014   INR 1.21 12/05/2014   INR 2.90 11/24/2014   PROTIME 30.0* 05/15/2012    ANTI-COAG DOSE: Anticoagulation Dose Instructions as of 12/12/2014      [name] Tue Wed Thu Fri Sat   New Dose 5 mg 5 mg 5 mg 5 mg 2.5 mg 5 mg 2.5 mg       ANTICOAG SUMMARY: Anticoagulation Episode Summary    Current INR goal 2.0-3.0  Next INR check 12/19/2014  INR from last check 1.30! (12/12/2014)  Weekly max dose   Target end date Indefinite  [facility] check [facility]  Preferred lab   Send INR reminders to    Indications  Long term (current) use of anticoagulants [Z79.01] CVA (cerebral infarction) [I63.9] Chronic atrial fibrillation [I48.2]        Comments         ANTICOAG TODAY: Anticoagulation Summary as of 12/12/2014    INR goal 2.0-3.0  Selected INR 1.30! (12/12/2014)  Next INR check 12/19/2014  Target end date Indefinite   Indications  Long term (current) use of anticoagulants [Z79.01] CVA (cerebral infarction) [I63.9] Chronic atrial fibrillation [I48.2]      Anticoagulation Episode Summary    [facility] check [facility]   Preferred lab    Send INR reminders to    Comments       PATIENT INSTRUCTIONS: Patient Instructions  Patient instructed to take medications as defined in the Anti-coagulation Track section of this encounter.  Patient instructed to take today's dose.  Patient instructed to use enoxaparin/Lovenox 120mg  (1.5mg /kg) subcutaneously administered once daily to an area 2 inches away from your "belly-button", alternating sites  left-right-left, etc.  Patient verbalized understanding of these instructions.       FOLLOW-UP Return in 7 days (on 12/19/2014) for Follow up INR at 0900h.  [name], III Pharm.D., CACP

## 2014-12-13 ENCOUNTER — Other Ambulatory Visit (HOSPITAL_COMMUNITY): Payer: Self-pay | Admitting: Interventional Radiology

## 2014-12-13 DIAGNOSIS — C787 Secondary malignant neoplasm of liver and intrahepatic bile duct: Secondary | ICD-10-CM

## 2014-12-13 DIAGNOSIS — C7B8 Other secondary neuroendocrine tumors: Secondary | ICD-10-CM

## 2014-12-13 NOTE — Progress Notes (Signed)
I have reviewed Dr. Gladstone Pih note.  Jonathan Reed on anticoagulation for chronic afib.  He is being bridged with lovenox, as INR low.

## 2014-12-19 ENCOUNTER — Ambulatory Visit (INDEPENDENT_AMBULATORY_CARE_PROVIDER_SITE_OTHER): Payer: Medicare Other | Admitting: Pharmacist

## 2014-12-19 DIAGNOSIS — Z8673 Personal history of transient ischemic attack (TIA), and cerebral infarction without residual deficits: Secondary | ICD-10-CM | POA: Diagnosis not present

## 2014-12-19 DIAGNOSIS — I482 Chronic atrial fibrillation, unspecified: Secondary | ICD-10-CM

## 2014-12-19 DIAGNOSIS — Z7901 Long term (current) use of anticoagulants: Secondary | ICD-10-CM | POA: Diagnosis not present

## 2014-12-19 DIAGNOSIS — I639 Cerebral infarction, unspecified: Secondary | ICD-10-CM

## 2014-12-19 LAB — POCT INR: INR: 2.1

## 2014-12-19 NOTE — Patient Instructions (Signed)
Patient instructed to take medications as defined in the Anti-coagulation Track section of this encounter.  Patient instructed to take today's dose.  Patient verbalized understanding of these instructions.    

## 2014-12-19 NOTE — Progress Notes (Signed)
Anti-Coagulation Progress Note  Jonathan Reed is a 69 y.o. male who is currently on an anti-coagulation regimen.    RECENT RESULTS: Recent results are below, the most recent result is correlated with a dose of 30 mg. per week: Lab Results  Component Value Date   INR 2.10 12/19/2014   INR 1.30 12/12/2014   INR 1.21 12/05/2014   PROTIME 30.0* 05/15/2012    ANTI-COAG DOSE: Anticoagulation Dose Instructions as of 12/19/2014      [name] Tue Wed Thu Fri Sat   New Dose 5 mg 5 mg 5 mg 5 mg 5 mg 5 mg 2.5 mg       ANTICOAG SUMMARY: Anticoagulation Episode Summary    Current INR goal 2.0-3.0  Next INR check 01/09/2015  INR from last check 2.10 (12/19/2014)  Weekly max dose   Target end date Indefinite  [facility]  Preferred lab   Send INR reminders to    Indications  Long term (current) use of anticoagulants [Z79.01] CVA (cerebral infarction) [I63.9] Chronic atrial fibrillation ([place]) [I48.2]        Comments         ANTICOAG TODAY: Anticoagulation Summary as of 12/19/2014    INR goal 2.0-3.0  Selected INR 2.10 (12/19/2014)  Next INR check 01/09/2015  Target end date Indefinite   Indications  Long term (current) use of anticoagulants [Z79.01] CVA (cerebral infarction) [I63.9] Chronic atrial fibrillation ([place]) [I48.2]      Anticoagulation Episode Summary    [facility] check location [facility]   Preferred lab    Send INR reminders to    Comments       PATIENT INSTRUCTIONS: Patient Instructions  Patient instructed to take medications as defined in the Anti-coagulation Track section of this encounter.  Patient instructed to take today's dose.  Patient verbalized understanding of these instructions.       FOLLOW-UP Return in 3 weeks (on 01/09/2015) for Follow up INR at 0845h.  [name], III Pharm.D., CACP

## 2014-12-23 NOTE — Progress Notes (Signed)
INTERNAL MEDICINE TEACHING ATTENDING ADDENDUM - Jezabella Schriever M.D  Duration- indefinite, Indication- afib, INR- therapeutic. Agree with pharmacy recommendations as outlined in their note.     

## 2015-01-09 ENCOUNTER — Ambulatory Visit (INDEPENDENT_AMBULATORY_CARE_PROVIDER_SITE_OTHER): Payer: Medicare Other | Admitting: Pharmacist

## 2015-01-09 DIAGNOSIS — I482 Chronic atrial fibrillation, unspecified: Secondary | ICD-10-CM

## 2015-01-09 DIAGNOSIS — Z8673 Personal history of transient ischemic attack (TIA), and cerebral infarction without residual deficits: Secondary | ICD-10-CM | POA: Diagnosis not present

## 2015-01-09 DIAGNOSIS — Z7901 Long term (current) use of anticoagulants: Secondary | ICD-10-CM

## 2015-01-09 DIAGNOSIS — I639 Cerebral infarction, unspecified: Secondary | ICD-10-CM

## 2015-01-09 LAB — POCT INR: INR: 3

## 2015-01-09 MED ORDER — WARFARIN SODIUM 5 MG PO TABS: ORAL_TABLET | ORAL | Status: AC

## 2015-01-09 NOTE — Progress Notes (Signed)
Anti-Coagulation Progress Note  Jonathan Reed is a 69 y.o. male who is currently on an anti-coagulation regimen.    RECENT RESULTS: Recent results are below, the most recent result is correlated with a dose of 35 mg. per week: Lab Results  Component Value Date   INR 3.0 01/09/2015   INR 2.10 12/19/2014   INR 1.30 12/12/2014   PROTIME 30.0* 05/15/2012    ANTI-COAG DOSE: Anticoagulation Dose Instructions as of 01/09/2015      [name] Tue Wed Thu Fri Sat   New Dose 5 mg 2.5 mg 5 mg 5 mg 2.5 mg 5 mg 5 mg       ANTICOAG SUMMARY: Anticoagulation Episode Summary    Current INR goal 2.0-3.0  Next INR check 01/30/2015  INR from last check 3.0 (01/09/2015)  Weekly max dose   Target end date Indefinite  [facility] location [facility]  Preferred lab   Send INR reminders to    Indications  Long term (current) use of anticoagulants [Z79.01] CVA (cerebral infarction) [I63.9] Chronic atrial fibrillation ([place]) [I48.2]        Comments         ANTICOAG TODAY: Anticoagulation Summary as of 01/09/2015    INR goal 2.0-3.0  Selected INR 3.0 (01/09/2015)  Next INR check 01/30/2015  Target end date Indefinite   Indications  Long term (current) use of anticoagulants [Z79.01] CVA (cerebral infarction) [I63.9] Chronic atrial fibrillation ([place]) [I48.2]      Anticoagulation Episode Summary    [facility] check [facility]   Preferred lab    Send INR reminders to    Comments       PATIENT INSTRUCTIONS: Patient Instructions  Patient instructed to take medications as defined in the Anti-coagulation Track section of this encounter.  Patient instructed to take today's dose.  Patient verbalized understanding of these instructions.       FOLLOW-UP Return in 3 weeks (on 01/30/2015) for Follow up INR at 0900h.  [name], III Pharm.D., CACP

## 2015-01-09 NOTE — Patient Instructions (Signed)
Patient instructed to take medications as defined in the Anti-coagulation Track section of this encounter.  Patient instructed to take today's dose.  Patient verbalized understanding of these instructions.    

## 2015-01-09 NOTE — Addendum Note (Signed)
Addended by: [name] B on: 01/09/2015 09:04 AM   Modules accepted: Orders

## 2015-01-10 ENCOUNTER — Ambulatory Visit
Admission: RE | Admit: 2015-01-10 | Discharge: 2015-01-10 | Disposition: A | Discharge status: Home / Self Care | Payer: Medicare Other | Source: Ambulatory Visit | Attending: Interventional Radiology | Admitting: Interventional Radiology

## 2015-01-10 ENCOUNTER — Encounter: Payer: Self-pay | Admitting: *Deleted

## 2015-01-10 ENCOUNTER — Other Ambulatory Visit: Payer: Self-pay | Admitting: *Deleted

## 2015-01-10 ENCOUNTER — Other Ambulatory Visit (HOSPITAL_COMMUNITY): Payer: Self-pay | Admitting: Interventional Radiology

## 2015-01-10 DIAGNOSIS — C7B8 Other secondary neuroendocrine tumors: Secondary | ICD-10-CM

## 2015-01-10 DIAGNOSIS — C787 Secondary malignant neoplasm of liver and intrahepatic bile duct: Secondary | ICD-10-CM

## 2015-01-10 NOTE — Progress Notes (Signed)
Chief Complaint: Patient was seen in consultation today for  Chief Complaint  Patient presents with  . Follow-up    follow up Y-90    at the request of Shick,Michael  Referring Physician(s): Shick,Michael  History of Present Illness: Jonathan Reed is a 69 y.o. male who underwent a Y-90 for a metastatic neuroendocrine tumor on 12/05/2014.  He is accompanied by his daughters today.  He is doing pretty well and stays active by refinishing antique furniture.  He does c/o some weight loss. He reports only about 5-6 lbs.  He says "food has no taste".  He is sleeping well at night. He does take about an hour nap during the day but states "I get up and go back to work".  His daughter states that "he gets tired a lot" but [name] states "I just keep going".  He is currently not taking Sandostatin injections and states when it was increased to 30 it started to make him sick, so he stopped them completely.  He has not seen Dr. Lucas Mallow since July.  He denies alcohol use.  Past Medical History  Diagnosis Date  . CHF (congestive heart failure)     with ICD (f/u with [facility])  . CVA (cerebral infarction) 2011    No residual Sx  . CAD (coronary artery disease) 2000  . HTN (hypertension)   . Nausea alone 01/06/2013  . Abdominal pain, unspecified site 01/06/2013  . Stroke   . Tooth ache 01/04/2014  . Carcinoid syndrome   . Metastatic malignant neuroendocrine tumor to liver   . Metastasis to liver 10/06/2013  . Bronchitis 09/23/2014  . Gout     Past Surgical History  Procedure Laterality Date  . Cardiac defibrillator placement    . Inguinal hernia repair Bilateral   . Sinus surgery with instatrak    . Colonoscopy  1980    colon polyps, no f/u since.     Allergies: Penicillins  Medications: Prior to Admission medications   Medication Sig Start Date End Date Taking? Authorizing Provider  colchicine (COLCRYS) 0.6 MG tablet Take 1 tablet (0.6 mg total) by mouth as needed  (For acute gout flare up.). Take 2 tabs (1.2 mg) then 1 tab (0.6 mg) one hour later. 11/24/14   Zada Finders, MD  Enoxaparin Sodium (LOVENOX IJ) Inject as directed 2 (two) times daily.     Historical Provider, MD  lisinopril (PRINIVIL,ZESTRIL) 20 MG tablet Take 1 tablet (20 mg total) by mouth daily. 11/16/14 11/16/15  Maryellen Pile, MD  metoprolol succinate (TOPROL-XL) 50 MG 24 hr tablet Take 1 tablet (50 mg total) by mouth daily. Take with or immediately following a meal. 06/29/13   Blain Pais, MD  naproxen sodium (ANAPROX) 220 MG tablet Take 220 mg by mouth 2 (two) times daily with a meal.    Historical Provider, MD  omeprazole (PRILOSEC) 20 MG capsule Take 20 mg by mouth daily.    Historical Provider, MD  oxyCODONE (OXY IR/ROXICODONE) 5 MG immediate release tablet Take 1 tablet (5 mg total) by mouth every 6 (six) hours as needed for moderate pain or severe pain. 09/18/14   Jaynee Eagles, PA-C  spironolactone (ALDACTONE) 25 MG tablet Take 25 mg by mouth daily.    Historical Provider, MD  warfarin (COUMADIN) 5 MG tablet Take 1/2 tablet on Mondays/Thursdays; take 1 tablet all other days. 01/09/15   Oval Linsey, MD     Family History  Problem Relation Age of Onset  . Stroke  Mother   . Diabetes Father     diabetic coma  . Diabetes Sister   . Cancer Sister     intestinal ca?  . Prostate cancer Brother     prostate  . Breast cancer Maternal Aunt   . Clotting disorder Brother   . Heart disease Brother   . Kidney disease Brother   . Stroke Sister   . Hypertension Daughter     Social History   Social History  . Marital Status: Married    Spouse Name: N/A  . Number of Children: 2  . Years of Education: N/A   Occupational History  .      refurnishing furniture   Social History Main Topics  . Smoking status: Never Smoker   . Smokeless tobacco: Never Used  . Alcohol Use: No  . Drug Use: No  . Sexual Activity: Not Currently   Other Topics Concern  . Not on file   Social  History Narrative    Review of Systems  Constitutional: Positive for activity change, appetite change and fatigue. Negative for fever and chills.  HENT: Negative.   Respiratory: Negative.   Cardiovascular: Negative.   Gastrointestinal: Positive for abdominal pain.       Some RUQ pain but has slowly improved since the procedure.  Genitourinary: Negative.   Musculoskeletal: Negative.   Skin: Negative.   Neurological: Negative.   Psychiatric/Behavioral: Negative.     Vital Signs: BP 103/78 mmHg  Pulse 64  Temp(Src) 98 F (36.7 C) (Oral)  Resp 13  SpO2 98%  Physical Exam  Constitutional: He is oriented to person, place, and time. He appears well-developed and well-nourished.  HENT:  Head: Normocephalic and atraumatic.  Eyes: EOM are normal.  Neck: Normal range of motion. Neck supple.  Cardiovascular: Normal rate, regular rhythm and normal heart sounds.   Pulmonary/Chest: Effort normal and breath sounds normal. He has no wheezes.  Abdominal: Soft. Bowel sounds are normal. He exhibits no distension. There is no tenderness.  Musculoskeletal: Normal range of motion.  Neurological: He is alert and oriented to person, place, and time.  Skin: Skin is warm and dry.  Psychiatric: He has a normal mood and affect. His behavior is normal. Judgment and thought content normal.  Vitals reviewed.   Mallampati Score:     Imaging: No results found.  Labs:  CBC:  Recent Labs  09/07/14 0811 09/18/14 0856 10/21/14 0732 11/07/14 0810 12/05/14 0755  WBC 6.3 10.4* 5.3 6.4 9.1  HGB 13.7 13.9* 14.1 13.2 13.4  HCT 41.1 42.6* 42.0 40.1 38.5*  PLT 146  --  177 319 227    COAGS:  Recent Labs  10/21/14 0732  11/07/14 0810  12/05/14 0755 12/12/14 1618 12/19/14 0901 01/09/15 0858  INR 1.10  < > 1.28  < > 1.21 1.30 2.10 3.0  APTT 32  --  33  --  37  --   --   --   < > = values in this interval not displayed.  BMP:  Recent Labs  09/18/14 0952 10/21/14 0732 11/07/14 0810  12/05/14 0755  NA 136 137 135 137  K 4.0 3.8 4.3 4.0  CL 98 105 103 104  CO2 24 24 27 23   GLUCOSE 115* 105* 111* 119*  BUN 22 21* 28* 16  CALCIUM 9.0 9.3 9.4 9.6  CREATININE 1.45* 1.51* 1.53* 1.32*  GFRNONAA  --  46* 46* 55*  GFRAA  --  54* 53* >60    LIVER FUNCTION TESTS:  Recent Labs  09/18/14 0952 10/21/14 0732 11/07/14 0810 11/24/14 1105 12/05/14 0755  BILITOT 0.6 0.7 0.5 0.5 0.9  AST 14 31 54*  --  32  ALT 16 30 61  --  26  ALKPHOS 136* 115 227*  --  140*  PROT 7.0 7.4 7.4  --  7.9  ALBUMIN 3.9 3.9 3.4*  --  3.4*    TUMOR MARKERS:  Recent Labs  11/07/14 0810  AFPTM 9.0*  CEA 3.1  CHROMGRNA 1    Assessment:  Progressive metastatic neuroendocrine tumor of liver s/p Y-90 on 12/05/2014  Will obtain follow up CT scan of Abdomen with IV Contrast in one month  Will arrange follow up with Dr Alvy Bimler as soon as possible.  Recommend resuming Sandostatin injections, likely needs reduced dose.  Recommend no napping in the daytime so nighttime sleep is better.  Recommend 5 small meals per day, including supplements like Ensure or milkshakes.   Signed: Murrell Redden PA-C 01/10/2015, 10:51 AM   Please refer to Dr. Fritz Pickerel attestation of this note for management and plan.

## 2015-01-10 NOTE — Progress Notes (Signed)
Indication: Chronic atrial fibrillation.  Duration: Lifelong.  INR at target.  Agree with Dr. Gladstone Pih assessment and plan as documented.

## 2015-01-12 ENCOUNTER — Other Ambulatory Visit: Payer: Self-pay | Admitting: Hematology and Oncology

## 2015-01-16 ENCOUNTER — Telehealth: Payer: Self-pay | Admitting: *Deleted

## 2015-01-16 NOTE — Telephone Encounter (Signed)
TC from patient's daughter, [name]. She  States patient is requesting appt with Dr. Marin Olp and would like the appt as soon as possible - in lieu of appt with Dr. Alvy Bimler on 02/02/15

## 2015-01-17 NOTE — Telephone Encounter (Signed)
Spoke with Charlie Pitter and she will take care of this

## 2015-01-17 NOTE — Telephone Encounter (Signed)
Please call KIm Hegarty to transfer care to Dr. Marin Olp and document in the chart

## 2015-01-30 ENCOUNTER — Ambulatory Visit (INDEPENDENT_AMBULATORY_CARE_PROVIDER_SITE_OTHER): Payer: Medicare Other | Admitting: Pharmacist

## 2015-01-30 ENCOUNTER — Telehealth: Payer: Self-pay | Admitting: *Deleted

## 2015-01-30 DIAGNOSIS — I482 Chronic atrial fibrillation, unspecified: Secondary | ICD-10-CM

## 2015-01-30 DIAGNOSIS — Z7901 Long term (current) use of anticoagulants: Secondary | ICD-10-CM

## 2015-01-30 DIAGNOSIS — Z8673 Personal history of transient ischemic attack (TIA), and cerebral infarction without residual deficits: Secondary | ICD-10-CM

## 2015-01-30 DIAGNOSIS — I639 Cerebral infarction, unspecified: Secondary | ICD-10-CM

## 2015-01-30 LAB — POCT INR: INR: 1.9

## 2015-01-30 NOTE — Progress Notes (Signed)
Anti-Coagulation Progress Note  Jonathan Reed is a 69 y.o. male who is currently on an anti-coagulation regimen.    RECENT RESULTS: Recent results are below, the most recent result is correlated with a dose of 30 mg. per week: Lab Results  Component Value Date   INR 1.90 01/30/2015   INR 3.0 01/09/2015   INR 2.10 12/19/2014   PROTIME 30.0* 05/15/2012    ANTI-COAG DOSE: Anticoagulation Dose Instructions as of 01/30/2015      [name] Tue Wed Thu Fri Sat   New Dose 5 mg 5 mg 5 mg 5 mg 5 mg 5 mg 5 mg       ANTICOAG SUMMARY: Anticoagulation Episode Summary    Current INR goal 2.0-3.0  Next INR check 02/27/2015  INR from last check 1.90! (01/30/2015)  Weekly max dose   Target end date Indefinite  [facility] check location [facility]  Preferred lab   Send INR reminders to    Indications  Long term (current) use of anticoagulants [Z79.01] CVA (cerebral infarction) [I63.9] Chronic atrial fibrillation ([place]) [I48.2]        Comments         ANTICOAG TODAY: Anticoagulation Summary as of 01/30/2015    INR goal 2.0-3.0  Selected INR 1.90! (01/30/2015)  Next INR check 02/27/2015  Target end date Indefinite   Indications  Long term (current) use of anticoagulants [Z79.01] CVA (cerebral infarction) [I63.9] Chronic atrial fibrillation ([place]) [I48.2]      Anticoagulation Episode Summary    INR check location [facility]   Preferred lab    Send INR reminders to    Comments       PATIENT INSTRUCTIONS: Patient Instructions  Patient instructed to take medications as defined in the Anti-coagulation Track section of this encounter.  Patient instructed to take today's dose.  Patient verbalized understanding of these instructions.       FOLLOW-UP Return for Follow up INR at 1000h.  [name], III Pharm.D., CACP

## 2015-01-30 NOTE — Telephone Encounter (Signed)
Daughter notified of appt with Dr Marin Olp.

## 2015-01-30 NOTE — Patient Instructions (Signed)
Patient instructed to take medications as defined in the Anti-coagulation Track section of this encounter.  Patient instructed to take today's dose.  Patient verbalized understanding of these instructions.    

## 2015-02-02 ENCOUNTER — Ambulatory Visit: Payer: Medicare Other

## 2015-02-02 ENCOUNTER — Other Ambulatory Visit: Payer: Medicare Other

## 2015-02-02 ENCOUNTER — Ambulatory Visit: Payer: Medicare Other | Admitting: Hematology and Oncology

## 2015-02-08 LAB — CREATININE WITH EST GFR
Creat: 1 mg/dL (ref 0.70–1.25)
GFR, EST NON AFRICAN AMERICAN: 78 mL/min (ref >=60)
GFR, Est African American: >89 mL/min (ref >=60)

## 2015-02-08 LAB — BUN: BUN: 17 mg/dL (ref 7–25)

## 2015-02-14 ENCOUNTER — Ambulatory Visit
Admission: RE | Admit: 2015-02-14 | Discharge: 2015-02-14 | Disposition: A | Discharge status: Home / Self Care | Payer: Medicare Other | Source: Ambulatory Visit | Attending: Interventional Radiology | Admitting: Interventional Radiology

## 2015-02-14 ENCOUNTER — Encounter (HOSPITAL_COMMUNITY): Payer: Self-pay

## 2015-02-14 ENCOUNTER — Ambulatory Visit (HOSPITAL_COMMUNITY)
Admission: RE | Admit: 2015-02-14 | Discharge: 2015-02-14 | Disposition: A | Discharge status: Home / Self Care | Payer: Medicare Other | Source: Ambulatory Visit | Attending: Interventional Radiology | Admitting: Interventional Radiology

## 2015-02-14 ENCOUNTER — Other Ambulatory Visit: Payer: Self-pay | Admitting: Radiology

## 2015-02-14 DIAGNOSIS — C7B8 Other secondary neuroendocrine tumors: Secondary | ICD-10-CM | POA: Insufficient documentation

## 2015-02-14 DIAGNOSIS — C787 Secondary malignant neoplasm of liver and intrahepatic bile duct: Secondary | ICD-10-CM

## 2015-02-14 MED ORDER — IOHEXOL 350 MG/ML SOLN
100.0000 mL | Freq: Once | INTRAVENOUS | Status: AC | PRN
  Administered 2015-02-14: 100 mL via INTRAVENOUS

## 2015-02-14 NOTE — Progress Notes (Signed)
Patient ID: Jonathan Reed, male   DOB: 11-04-1945, 69 y.o.   MRN: [id number]       Chief Complaint: Patient was seen in consultation today for  Chief Complaint  Patient presents with  . Follow-up    follow up Y-90 SIRT x2    at the request of Joaquina Nissen  Referring Physician(s): Qais Jowers  History of Present Illness: Jonathan Reed is a 69 y.o. male 3 months status post Y 90 radio embolization for metastatic neuroendocrine tumor to the liver. He is accompanied by several family members today. Overall he continues to do very well. He stays active refinishing antique furniture. He reports no significant flushing symptoms, tachycardia, palpitations, or diarrhea. Stable bowel habits. He reports slight weight gain. His appetite has improved since his last visit. He is sleeping better at night. Overall he continues to do well. He has not had a Sandostatin injection from oncology since June 2016. He is here for three-month follow-up and to review posttreatment imaging.  Past Medical History  Diagnosis Date  . CHF (congestive heart failure) ([place])     with ICD (f/u with [facility])  . CVA (cerebral infarction) 2011    No residual Sx  . CAD (coronary artery disease) 2000  . HTN (hypertension)   . Nausea alone 01/06/2013  . Abdominal pain, unspecified site 01/06/2013  . Stroke ([place])   . Tooth ache 01/04/2014  . Carcinoid syndrome ([place])   . Metastatic malignant neuroendocrine tumor to liver ([place])   . Metastasis to liver ([place]) 10/06/2013  . Bronchitis 09/23/2014  . Gout     Past Surgical History  Procedure Laterality Date  . Cardiac defibrillator placement    . Inguinal hernia repair Bilateral   . Sinus surgery with instatrak    . Colonoscopy  1980    colon polyps, no f/u since.     Allergies: Penicillins  Medications: Prior to Admission medications   Medication Sig Start Date End Date Taking? Authorizing Provider  colchicine (COLCRYS) 0.6 MG tablet Take 1 tablet (0.6 mg total) by  mouth as needed (For acute gout flare up.). Take 2 tabs (1.2 mg) then 1 tab (0.6 mg) one hour later. 11/24/14  Yes Zada Finders, MD  lisinopril (PRINIVIL,ZESTRIL) 20 MG tablet Take 1 tablet (20 mg total) by mouth daily. 11/16/14 11/16/15 Yes Maryellen Pile, MD  metoprolol succinate (TOPROL-XL) 50 MG 24 hr tablet Take 1 tablet (50 mg total) by mouth daily. Take with or immediately following a meal. 06/29/13  Yes Blain Pais, MD  warfarin (COUMADIN) 5 MG tablet Take 1/2 tablet on Mondays/Thursdays; take 1 tablet all other days. 01/09/15  Yes Oval Linsey, MD  Enoxaparin Sodium (LOVENOX IJ) Inject as directed 2 (two) times daily.     Historical Provider, MD  naproxen sodium (ANAPROX) 220 MG tablet Take 220 mg by mouth 2 (two) times daily with a meal.    Historical Provider, MD  omeprazole (PRILOSEC) 20 MG capsule Take 20 mg by mouth daily.    Historical Provider, MD  oxyCODONE (OXY IR/ROXICODONE) 5 MG immediate release tablet Take 1 tablet (5 mg total) by mouth every 6 (six) hours as needed for moderate pain or severe pain. Patient not taking: Reported on 02/14/2015 09/18/14   Jaynee Eagles, PA-C  spironolactone (ALDACTONE) 25 MG tablet Take 25 mg by mouth daily.    Historical Provider, MD     Family History  Problem Relation Age of Onset  . Stroke Mother   . Diabetes Father  diabetic coma  . Diabetes Sister   . Cancer Sister     intestinal ca?  . Prostate cancer Brother     prostate  . Breast cancer Maternal Aunt   . Clotting disorder Brother   . Heart disease Brother   . Kidney disease Brother   . Stroke Sister   . Hypertension Daughter     Social History   Social History  . Marital Status: Married    Spouse Name: N/A  . Number of Children: 2  . Years of Education: N/A   Occupational History  .      refurnishing furniture   Social History Main Topics  . Smoking status: Never Smoker   . Smokeless tobacco: Never Used  . Alcohol Use: No  . Drug Use: No  . Sexual  Activity: Not Currently   Other Topics Concern  . Not on file   Social History Narrative    ECOG Status: 1 - Symptomatic but completely ambulatory  Review of Systems: A 12 point ROS discussed and pertinent positives are indicated in the HPI above.  All other systems are negative.  Review of Systems  Constitutional: Negative for fever, activity change, appetite change, fatigue and unexpected weight change.  Respiratory: Negative for chest tightness, shortness of breath and wheezing.   Cardiovascular: Negative for chest pain and palpitations.  Gastrointestinal: Negative for nausea, vomiting, abdominal pain, diarrhea and constipation.    Vital Signs: BP 116/83 mmHg  Pulse 78  Temp(Src) 98.2 F (36.8 C) (Oral)  Resp 14  Ht 5\' 11"  (1.803 m)  Wt 178 lb (80.74 kg)  BMI 24.84 kg/m2  SpO2 99%  Physical Exam  Constitutional: He appears well-developed and well-nourished. No distress.  Eyes: Conjunctivae are normal. No scleral icterus.  Cardiovascular: Normal rate and regular rhythm.   No murmur heard. Pulmonary/Chest: Effort normal and breath sounds normal. No respiratory distress. He has no rales.  Abdominal: Soft. Bowel sounds are normal. He exhibits no distension. There is no rebound and no guarding.  Liver is palpable beneath the right subcostal margin.  Skin: He is not diaphoretic.  Psychiatric: He has a normal mood and affect. His behavior is normal.     Imaging: Ct Abd Wo & W Cm  02/14/2015  ADDENDUM REPORT: 02/14/2015 13:54 ADDENDUM: Additional data: In examining the liver craniocaudad dimension there is measurable interval increase in volume of the liver measuring 27 cm in craniocaudad dimension compared to 22 cm on most recent CT exam. Visually the liver also is increased in volume. There is increase nodularity along the margin of the liver additionally Increase in volume of the liver is concerning for under lying increase in tumor volume burden. Increased nodularity  could relate to cirrhosis or tumor burden. Findings conveyed toMICHAEL Quartez Lag[place] on 02/14/2015  at13:52. Electronically Signed   By: Suzy Bouchard M.D.   On: 02/14/2015 13:54  02/14/2015  CLINICAL DATA:  Metastatic neuroendocrine tumor. Oral chemotherapy ongoing. Yttrium 90 radio embolization of the LEFT and RIGHT hepatic lobe and August and September 2016. EXAM: CT ABDOMEN WITHOUT AND WITH CONTRAST TECHNIQUE: Multidetector CT imaging of the abdomen was performed following the standard protocol before and following the bolus administration of intravenous contrast. CONTRAST:  171mL OMNIPAQUE IOHEXOL 350 MG/ML SOLN COMPARISON:  None. FINDINGS: Lower chest: Atelectasis in the inferior RIGHT lower lobe. Hepatobiliary: With abundance of metastatic disease and variable enhancement pattern difficult to assess treatment response. Lesion in the caudate lobe measures 2.5 x 3.9 cm decreased from 3.3 x  4.3 cm. Additional previously measured individual lesions are less well-defined on current exam. Dominant lesion in the superior RIGHT hepatic lobe measures 11.4 by 8.1 cm (image 28, series 6) compared to 12.0 by 8.1 cm remeasured for no clear change. Interval size of liver is unchanged. On the early arterial phase imaging there are multiple discrete round enhancing lesions within the liver parenchyma. For example 2.9 cm lesion in the inferior RIGHT hepatic lobe (image 81, series 4). The liver has a nodular contour similar prior. The portal veins are patent. No ascites. Pancreas: Pancreas is normal. No ductal dilatation. No pancreatic inflammation. Spleen: Normal spleen Adrenals/urinary tract: Adrenal glands normal. Adrenal glands kidneys are normal. Stomach/Bowel: The stomach and limited view of the bowel is unremarkable. Vascular/Lymphatic: Aorta is normal. Mild intimal calcification. No retroperitoneal periportal lymphadenopathy. Other: No free fluid. Musculoskeletal: No aggressive osseous lesion. IMPRESSION: 1. Difficult to  assess change in the widespread hepatic metastasis due to variable enhancement between studies and ill-defined lesions. No clear evidence of disease progression. At least 1 lesion does measure smaller. There are multiple round enhancing lesion on the arterial phase. No previous arterial phase performed. Overall size of liver is unchanged. Recommend FDG PET scan for future evaluation of metabolically active lesions to serve as baseline. Also recommend following chromogranin A go levels (last drawn August 2016). 2. Nodular liver unchanged. 3. No ascites. 4. No evidence of metastatic disease in the abdomen. Electronically Signed: By: Suzy Bouchard M.D. On: 02/14/2015 12:38    Labs:  CBC:  Recent Labs  09/07/14 0811 09/18/14 0856 10/21/14 0732 11/07/14 0810 12/05/14 0755  WBC 6.3 10.4* 5.3 6.4 9.1  HGB 13.7 13.9* 14.1 13.2 13.4  HCT 41.1 42.6* 42.0 40.1 38.5*  PLT 146  --  177 319 227    COAGS:  Recent Labs  10/21/14 0732  11/07/14 0810  12/05/14 0755 12/12/14 1618 12/19/14 0901 01/09/15 0858 01/30/15 1018  INR 1.10  < > 1.28  < > 1.21 1.30 2.10 3.0 1.90  APTT 32  --  33  --  37  --   --   --   --   < > = values in this interval not displayed.  BMP:  Recent Labs  09/18/14 0952 10/21/14 0732 11/07/14 0810 12/05/14 0755 02/08/15 1059  NA 136 137 135 137  --   K 4.0 3.8 4.3 4.0  --   CL 98 105 103 104  --   CO2 24 24 27 23   --   GLUCOSE 115* 105* 111* 119*  --   BUN 22 21* 28* 16 17  CALCIUM 9.0 9.3 9.4 9.6  --   CREATININE 1.45* 1.51* 1.53* 1.32* 1.00  GFRNONAA  --  46* 46* 55* 78  GFRAA  --  54* 53* >60 >89    LIVER FUNCTION TESTS:  Recent Labs  09/18/14 0952 10/21/14 0732 11/07/14 0810 11/24/14 1105 12/05/14 0755  BILITOT 0.6 0.7 0.5 0.5 0.9  AST 14 31 54*  --  32  ALT 16 30 61  --  26  ALKPHOS 136* 115 227*  --  140*  PROT 7.0 7.4 7.4  --  7.9  ALBUMIN 3.9 3.9 3.4*  --  3.4*    TUMOR MARKERS:  Recent Labs  11/07/14 0810  AFPTM 9.0*  CEA  3.1  CHROMGRNA 1    Assessment and Plan:  3 months status post Y 90 radio embolization for progressive metastatic neuroendocrine tumor to the liver. He has recovered very well.  Stable functional status. No physical limitations. CT imaging today demonstrates a mixed response with some lesions appearing smaller however he does have extensive tumor burden throughout the liver. Liver also has enlarged compared to his pretreatment CTs.  Plan: Repeat CBC, CMP, and tumor markers.  Follow-up with oncology. Patient is transferring his care to Dr.Ennever, who will see him next week.  Reconsider Sandostatin injections  Review CMP and tumor markers to consider a third Y 90 treatment.   SignedGreggory Keen 02/14/2015, 3:16 PM   I spent a total of  30 Minutes   in face to face in clinical consultation, greater than 50% of which was counseling/coordinating care for this patient with metastatic neuroendocrine tumor to the liver

## 2015-02-15 LAB — CBC
HEMATOCRIT: 40.2 % (ref 39.0–52.0)
HEMOGLOBIN: 13 g/dL (ref 13.0–17.0)
MCH: 30.3 pg (ref 26.0–34.0)
MCHC: 32.3 g/dL (ref 30.0–36.0)
MCV: 93.7 fL (ref 78.0–100.0)
MPV: 11.1 fL (ref 8.6–12.4)
Platelets: 253 10*3/uL (ref 150–400)
RBC: 4.29 MIL/uL (ref 4.22–5.81)
RDW: 15.4 % (ref 11.5–15.5)
WBC: 5.4 10*3/uL (ref 4.0–10.5)

## 2015-02-15 LAB — COMPREHENSIVE METABOLIC PANEL
ALBUMIN: 3.5 g/dL — AB (ref 3.6–5.1)
ALT: 30 U/L (ref 9–46)
AST: 37 U/L — AB (ref 10–35)
Alkaline Phosphatase: 412 U/L — ABNORMAL HIGH (ref 40–115)
BUN: 19 mg/dL (ref 7–25)
CHLORIDE: 106 mmol/L (ref 98–110)
CO2: 29 mmol/L (ref 20–31)
CREATININE: 1.15 mg/dL (ref 0.70–1.25)
Calcium: 9.5 mg/dL (ref 8.6–10.3)
Glucose, Bld: 88 mg/dL (ref 65–99)
POTASSIUM: 4.4 mmol/L (ref 3.5–5.3)
SODIUM: 141 mmol/L (ref 135–146)
TOTAL PROTEIN: 6.5 g/dL (ref 6.1–8.1)
Total Bilirubin: 0.7 mg/dL (ref 0.2–1.2)

## 2015-02-15 LAB — AFP TUMOR MARKER: AFP TUMOR MARKER: 9.9 ng/mL — AB (ref <6.1)

## 2015-02-15 LAB — CEA: CEA: 2.1 ng/mL (ref 0.0–5.0)

## 2015-02-17 LAB — CHROMOGRANIN A: Chromogranin A: 23 ng/mL — ABNORMAL HIGH (ref <=15)

## 2015-02-20 ENCOUNTER — Other Ambulatory Visit: Payer: Medicare Other

## 2015-02-20 ENCOUNTER — Ambulatory Visit (HOSPITAL_BASED_OUTPATIENT_CLINIC_OR_DEPARTMENT_OTHER): Payer: Medicare Other | Admitting: Hematology & Oncology

## 2015-02-20 ENCOUNTER — Encounter: Payer: Self-pay | Admitting: Hematology & Oncology

## 2015-02-20 VITALS — BP 145/103 | HR 83 | Temp 97.6°F | Resp 18 | Ht 71.0 in | Wt 180.0 lb

## 2015-02-20 DIAGNOSIS — C7A8 Other malignant neuroendocrine tumors: Secondary | ICD-10-CM

## 2015-02-20 DIAGNOSIS — C7B8 Other secondary neuroendocrine tumors: Secondary | ICD-10-CM | POA: Diagnosis not present

## 2015-02-20 DIAGNOSIS — R160 Hepatomegaly, not elsewhere classified: Secondary | ICD-10-CM

## 2015-02-22 NOTE — Progress Notes (Signed)
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 Follow-up  : I have cancer my liver.  HPI: Mr. Jonathan Reed is a very nice 69 year old African-American gentleman. He has a couple year history of a well-differentiated neuroendocrine carcinoma. It is felt that the primary was probably in the ileum.  He presented back in January 2014. He did undergo a liver biopsy. The pathology report  [id number]) showed a well differentiated neuroendocrine carcinoma.  He was seen out at [facility]. He was treated with Xeloda and Temodar. He appeared every respond to this.  He was then given a break from treatment. He was treated with Sandostatin. From what he says, he got very sick with 1 dose of Sandostatin.  He recently underwent Y 90 therapy. This was done in radiology on September 19. He tolerated this well. He had a recent CT scan. He does have some hepatomegaly. However, he looks like he has had a good response with decrease in his tumor burden.  His local oncologist has moved. And since he lives close to our office, he was kindly referred to our office for treatment management.  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 From what his family says, he has some pieces in the [facility]. He still does this.  He's had no pain. He's had no diarrhea. His appetite has been good.  He does not drink or smoke.  He had tumor levels done back in November. His chromogranin A was 23. His alpha-fetoprotein was 9.9. CEA was 2.1.  He's had no cough or shortness of breath. He's had no rashes. He's had no wheezing.  Overall, his performance status is ECOG 1.                              Past Medical History  Diagnosis Date  . CHF (congestive heart failure) ([place])     with ICD (f/u with [facility])  . CVA (cerebral infarction) 2011    No residual Sx   . CAD (coronary artery disease) 2000  . HTN (hypertension)   . Nausea alone 01/06/2013  . Abdominal pain, unspecified site 01/06/2013  . Stroke ([place])   . Tooth ache 01/04/2014  . Carcinoid syndrome ([place])   . Metastatic malignant neuroendocrine tumor to liver ([place])   . Metastasis to liver ([place]) 10/06/2013  . Bronchitis 09/23/2014  . Gout   :  Past Surgical History  Procedure Laterality Date  . Cardiac defibrillator placement    . Inguinal hernia repair Bilateral   . Sinus surgery with instatrak    . Colonoscopy  1980    colon polyps, no f/u since.   :   Current outpatient prescriptions:  .  colchicine (COLCRYS) 0.6 MG tablet, Take 1 tablet (0.6 mg total) by mouth as needed (For acute gout flare up.). Take 2 tabs (1.2 mg) then 1 tab (0.6 mg) one hour later., Disp: 15 tablet, Rfl: 0 .  Enoxaparin Sodium (LOVENOX IJ), Inject as directed 2 (two) times daily. , Disp: , Rfl:  .  lisinopril (PRINIVIL,ZESTRIL) 20 MG tablet, Take 1 tablet (20 mg total) by mouth daily., Disp: 30 tablet, Rfl: 11 .  metoprolol succinate (TOPROL-XL) 50 MG 24 hr tablet, Take 1 tablet (50 mg total) by mouth daily. Take with or immediately following a meal., Disp: 30 tablet, Rfl: 2 .  naproxen sodium (ANAPROX) 220 MG tablet, Take 220 mg by mouth 2 (two) times daily with a meal., Disp: , Rfl:  .  omeprazole (PRILOSEC) 20 MG capsule, Take 20 mg by mouth daily., Disp: , Rfl:  .  oxyCODONE (OXY IR/ROXICODONE) 5 MG immediate release tablet, Take 1 tablet (5 mg total) by mouth every 6 (six) hours as needed for moderate pain or severe pain., Disp: 30 tablet, Rfl: 0 .  spironolactone (ALDACTONE) 25 MG tablet, Take 25 mg by mouth daily., Disp: , Rfl:  .  warfarin (COUMADIN) 5 MG tablet, Take 1/2 tablet on Mondays/Thursdays; take 1 tablet all other days., Disp: 30 tablet, Rfl: 2:  :  Allergies  Allergen Reactions  . Penicillins     Has patient had a PCN reaction causing immediate rash, facial/tongue/throat swelling, SOB  or lightheadedness with hypotension: No Has patient had a PCN reaction causing severe rash involving mucus membranes or skin necrosis: No Has patient had a PCN reaction that required hospitalization No Has patient had a PCN reaction occurring within the last 10 years: No   CONSTIPATION If all of the above answers are "NO", then may proceed with Cephalosporin use.  :  Family History  Problem Relation Age of Onset  . Stroke Mother   . Diabetes Father     diabetic coma  . Diabetes Sister   . Cancer Sister     intestinal ca?  . Prostate cancer Brother     prostate  . Breast cancer Maternal Aunt   . Clotting disorder Brother   . Heart disease Brother   . Kidney disease Brother   . Stroke Sister   . Hypertension Daughter   :  Social History   Social History  . Marital Status: Married    Spouse Name: N/A  . Number of Children: 2  . Years of Education: N/A   Occupational History  .      refurnishing furniture   Social History Main Topics  . Smoking status: Never Smoker   . Smokeless tobacco: Never Used  . Alcohol Use: No  . Drug Use: No  . Sexual Activity: Not Currently   Other Topics Concern  . Not on file   Social History Narrative  :  Pertinent items are noted in HPI.  Exam: @IPVITALS @  well-developed and well-nourished African-American male. His vital signs show temperature of 97.6. Pulse 85. Blood pressure 145/103. Weight is 180 pounds. Head and neck exam shows no ocular or oral lesions. He has no palpable cervical or supraclavicular lymph nodes. Lungs are clear. Cardiac exam regular rate and rhythm with no murmurs, rubs or bruits. Abdomen is soft. He has no fluid wave. He has no guarding or rebound tenderness. His liver edge is probably about 6 cm below the right costal margin. His liver does extend across the midline. There is no splenomegaly. Back exam shows no tenderness over the spine, ribs or hips. Extremities shows no clubbing, cyanosis or edema. Skin exam  shows no rashes, ecchymoses or petechia. Neurological exam is nonfocal.   No results for input(s): WBC, HGB, HCT, PLT in the last 72 hours. No results for input(s): NA, K, CL, CO2, GLUCOSE, BUN, CREATININE, CALCIUM in the last 72 hours.  Blood smear review:  None  Pathology: None     Assessment and Plan:  Mr. Jonathan Reed is a very nice 69 year old African-American male. He has a metastatic neuroendocrine tumor that is well-differentiated. He has hepatomegaly. He may have more the one issue going on  with the liver. He has been checked for hepatitis in the past. Everything looked okay.  He has to get back onto therapy. I think a good idea would be Somatuline. I think this would be tolerable. I talked to him and his family about this. His family is very very nice. One of his daughters served in the [employer].  I think the Somatuline would be reasonable. We would follow him closely. His chromogranin A level was certainly gives [place] an idea as to how he is doing.  I think if we had to go with systemic therapy, we could utilize Afinitor or Sutent. I think this would have less toxicity than Temodar/Xeloda. However, it was apparent that the Temodar and Xeloda seem to work well for him. So there is a was that option.  I spent about an hour with he and his family. Again they are all very nice. He really has a very fascinating job.  I will go ahead and get him set up with a PET scan. We cannot do an MRI because he has a an implantable defibrillator..  I will start the Somatuline next week. I will then plan to get him back in January.

## 2015-02-27 ENCOUNTER — Ambulatory Visit (INDEPENDENT_AMBULATORY_CARE_PROVIDER_SITE_OTHER): Payer: Medicare Other | Admitting: Pharmacist

## 2015-02-27 ENCOUNTER — Ambulatory Visit: Payer: Medicare Other

## 2015-02-27 DIAGNOSIS — Z7901 Long term (current) use of anticoagulants: Secondary | ICD-10-CM

## 2015-02-27 DIAGNOSIS — I639 Cerebral infarction, unspecified: Secondary | ICD-10-CM

## 2015-02-27 DIAGNOSIS — I482 Chronic atrial fibrillation, unspecified: Secondary | ICD-10-CM

## 2015-02-27 LAB — POCT INR: INR: 2.5

## 2015-02-27 NOTE — Addendum Note (Signed)
Addended by: [name] B on: 02/27/2015 11:30 AM   Modules accepted: Medications

## 2015-02-27 NOTE — Patient Instructions (Signed)
Patient instructed to take medications as defined in the Anti-coagulation Track section of this encounter.  Patient instructed to take today's dose.  Patient verbalized understanding of these instructions.    

## 2015-02-27 NOTE — Progress Notes (Signed)
Anti-Coagulation Progress Note  Jonathan Reed is a 69 y.o. male who is currently on an anti-coagulation regimen.    RECENT RESULTS: Recent results are below, the most recent result is correlated with a dose of 35 mg. per week: Lab Results  Component Value Date   INR 2.50 02/27/2015   INR 1.90 01/30/2015   INR 3.0 01/09/2015   PROTIME 30.0* 05/15/2012    ANTI-COAG DOSE: Anticoagulation Dose Instructions as of 02/27/2015      [name] Tue Wed Thu Fri Sat   New Dose 5 mg 5 mg 5 mg 5 mg 5 mg 5 mg 5 mg       ANTICOAG SUMMARY: Anticoagulation Episode Summary    Current INR goal 2.0-3.0  Next INR check 03/27/2015  INR from last check 2.50 (02/27/2015)  Weekly max dose   Target end date Indefinite  [facility]  Preferred lab   Send INR reminders to    Indications  Long term (current) use of anticoagulants [Z79.01] CVA (cerebral infarction) [I63.9] Chronic atrial fibrillation ([place]) [I48.2]        Comments         ANTICOAG TODAY: Anticoagulation Summary as of 02/27/2015    INR goal 2.0-3.0  Selected INR 2.50 (02/27/2015)  Next INR check 03/27/2015  Target end date Indefinite   Indications  Long term (current) use of anticoagulants [Z79.01] CVA (cerebral infarction) [I63.9] Chronic atrial fibrillation ([place]) [I48.2]      Anticoagulation Episode Summary    [facility] check [facility]   Preferred lab    Send INR reminders to    Comments       PATIENT INSTRUCTIONS: Patient Instructions  Patient instructed to take medications as defined in the Anti-coagulation Track section of this encounter.  Patient instructed to take today's dose.  Patient verbalized understanding of these instructions.       FOLLOW-UP Return in 4 weeks (on 03/27/2015) for Follow up INR at 1030h.  [name], III Pharm.D., CACP

## 2015-03-02 NOTE — Progress Notes (Signed)
I have reviewed Dr. Groce's note.  Patient is on AC for Afib.  INR at goal.  

## 2015-03-03 ENCOUNTER — Ambulatory Visit (HOSPITAL_COMMUNITY): Payer: Medicare Other

## 2015-03-27 ENCOUNTER — Ambulatory Visit: Payer: Medicare Other

## 2015-03-27 ENCOUNTER — Other Ambulatory Visit: Payer: Medicare Other

## 2015-03-27 ENCOUNTER — Ambulatory Visit: Payer: Medicare Other | Admitting: Hematology & Oncology

## 2015-03-27 ENCOUNTER — Ambulatory Visit (INDEPENDENT_AMBULATORY_CARE_PROVIDER_SITE_OTHER): Payer: Medicare Other | Admitting: Pharmacist

## 2015-03-27 DIAGNOSIS — I639 Cerebral infarction, unspecified: Secondary | ICD-10-CM

## 2015-03-27 DIAGNOSIS — Z7901 Long term (current) use of anticoagulants: Secondary | ICD-10-CM

## 2015-03-27 DIAGNOSIS — I482 Chronic atrial fibrillation, unspecified: Secondary | ICD-10-CM

## 2015-03-27 DIAGNOSIS — Z8673 Personal history of transient ischemic attack (TIA), and cerebral infarction without residual deficits: Secondary | ICD-10-CM

## 2015-03-27 LAB — POCT INR: INR: 6

## 2015-03-27 NOTE — Progress Notes (Signed)
Anti-Coagulation Progress Note  Jonathan Reed is a 70 y.o. male who is currently on an anti-coagulation regimen.    RECENT RESULTS: Recent results are below, the most recent result is correlated with a dose of 35 mg. per week: Lab Results  Component Value Date   INR 6.00 03/27/2015   INR 2.50 02/27/2015   INR 1.90 01/30/2015   PROTIME 30.0* 05/15/2012    ANTI-COAG DOSE: Anticoagulation Dose Instructions as of 03/27/2015      [name] Tue Wed Thu Fri Sat   New Dose 5 mg 5 mg 2.5 mg 5 mg 5 mg 2.5 mg 5 mg    Description        OMIT / DO NOT TAKE any warfarin (Coumadin) tablets on Monday January 9th, 2017. The following day, re-commence based upon your dose response instructions provided.        ANTICOAG SUMMARY: Anticoagulation Episode Summary    Current INR goal 2.0-3.0  Next INR check 04/10/2015  INR from last check 6.00! (03/27/2015)  Weekly max dose   Target end date Indefinite  [facility]  Preferred lab   Send INR reminders to    Indications  Long term (current) use of anticoagulants [Z79.01] CVA (cerebral infarction) [I63.9] Chronic atrial fibrillation ([place]) [I48.2]        Comments         ANTICOAG TODAY: Anticoagulation Summary as of 03/27/2015    INR goal 2.0-3.0  Selected INR 6.00! (03/27/2015)  Next INR check 04/10/2015  Target end date Indefinite   Indications  Long term (current) use of anticoagulants [Z79.01] CVA (cerebral infarction) [I63.9] Chronic atrial fibrillation ([place]) [I48.2]      Anticoagulation Episode Summary    [facility]   Preferred lab    Send INR reminders to    Comments       PATIENT INSTRUCTIONS: Patient Instructions  Patient instructed to take medications as defined in the Anti-coagulation Track section of this encounter.  Patient instructed to OMIT today's dose.  Patient verbalized understanding of these instructions.       FOLLOW-UP Return in 2 weeks (on 04/10/2015) for Follow  up INR at 1000h.  [name], III Pharm.D., CACP

## 2015-03-27 NOTE — Patient Instructions (Signed)
Patient instructed to take medications as defined in the Anti-coagulation Track section of this encounter.  Patient instructed to OMIT today's dose.  Patient verbalized understanding of these instructions.    

## 2015-03-28 NOTE — Progress Notes (Signed)
I have reviewed Dr. Gladstone Pih note.  Patient is on [facility] for Afib.  INR elevated and coumadin decreased.

## 2015-03-30 ENCOUNTER — Encounter (HOSPITAL_COMMUNITY): Payer: Self-pay | Admitting: *Deleted

## 2015-03-30 ENCOUNTER — Ambulatory Visit: Payer: Medicare Other | Admitting: Pharmacist

## 2015-03-30 ENCOUNTER — Emergency Department (HOSPITAL_COMMUNITY)
Admission: EM | Admit: 2015-03-30 | Discharge: 2015-03-30 | Disposition: A | Discharge status: Home / Self Care | Payer: Medicare Other | Attending: Emergency Medicine | Admitting: Emergency Medicine

## 2015-03-30 DIAGNOSIS — Z86012 Personal history of benign carcinoid tumor: Secondary | ICD-10-CM | POA: Insufficient documentation

## 2015-03-30 DIAGNOSIS — I10 Essential (primary) hypertension: Secondary | ICD-10-CM | POA: Diagnosis not present

## 2015-03-30 DIAGNOSIS — R8299 Other abnormal findings in urine: Secondary | ICD-10-CM | POA: Diagnosis present

## 2015-03-30 DIAGNOSIS — Z95810 Presence of automatic (implantable) cardiac defibrillator: Secondary | ICD-10-CM | POA: Diagnosis not present

## 2015-03-30 DIAGNOSIS — Z8673 Personal history of transient ischemic attack (TIA), and cerebral infarction without residual deficits: Secondary | ICD-10-CM | POA: Insufficient documentation

## 2015-03-30 DIAGNOSIS — I509 Heart failure, unspecified: Secondary | ICD-10-CM | POA: Diagnosis not present

## 2015-03-30 DIAGNOSIS — N390 Urinary tract infection, site not specified: Secondary | ICD-10-CM | POA: Insufficient documentation

## 2015-03-30 DIAGNOSIS — Z7901 Long term (current) use of anticoagulants: Secondary | ICD-10-CM | POA: Diagnosis not present

## 2015-03-30 DIAGNOSIS — Z8719 Personal history of other diseases of the digestive system: Secondary | ICD-10-CM | POA: Insufficient documentation

## 2015-03-30 DIAGNOSIS — Z880 Allergy status to penicillin: Secondary | ICD-10-CM | POA: Insufficient documentation

## 2015-03-30 DIAGNOSIS — Z8709 Personal history of other diseases of the respiratory system: Secondary | ICD-10-CM | POA: Diagnosis not present

## 2015-03-30 DIAGNOSIS — I2510 Atherosclerotic heart disease of native coronary artery without angina pectoris: Secondary | ICD-10-CM | POA: Insufficient documentation

## 2015-03-30 DIAGNOSIS — M109 Gout, unspecified: Secondary | ICD-10-CM | POA: Insufficient documentation

## 2015-03-30 DIAGNOSIS — Z8505 Personal history of malignant neoplasm of liver: Secondary | ICD-10-CM | POA: Diagnosis not present

## 2015-03-30 DIAGNOSIS — R17 Unspecified jaundice: Secondary | ICD-10-CM

## 2015-03-30 LAB — URINALYSIS, ROUTINE W REFLEX MICROSCOPIC
Glucose, UA: NEGATIVE mg/dL
KETONES UR: 15 mg/dL — AB
NITRITE: POSITIVE — AB
PH: 5 (ref 5.0–8.0)
Protein, ur: 100 mg/dL — AB
SPECIFIC GRAVITY, URINE: 1.029 (ref 1.005–1.030)

## 2015-03-30 LAB — CBC WITH DIFFERENTIAL/PLATELET
BASOS PCT: 0 %
Basophils Absolute: 0 10*3/uL (ref 0.0–0.1)
EOS ABS: 0.1 10*3/uL (ref 0.0–0.7)
Eosinophils Relative: 2 %
HCT: 41.2 % (ref 39.0–52.0)
Hemoglobin: 13.7 g/dL (ref 13.0–17.0)
Lymphocytes Relative: 24 %
Lymphs Abs: 1 10*3/uL (ref 0.7–4.0)
MCH: 30.6 pg (ref 26.0–34.0)
MCHC: 33.3 g/dL (ref 30.0–36.0)
MCV: 92.2 fL (ref 78.0–100.0)
MONO ABS: 0.5 10*3/uL (ref 0.1–1.0)
MONOS PCT: 12 %
Neutro Abs: 2.7 10*3/uL (ref 1.7–7.7)
Neutrophils Relative %: 62 %
PLATELETS: 167 10*3/uL (ref 150–400)
RBC: 4.47 MIL/uL (ref 4.22–5.81)
RDW: 17.1 % — ABNORMAL HIGH (ref 11.5–15.5)
WBC: 4.3 10*3/uL (ref 4.0–10.5)

## 2015-03-30 LAB — URINE MICROSCOPIC-ADD ON

## 2015-03-30 LAB — COMPREHENSIVE METABOLIC PANEL
ALBUMIN: 2.6 g/dL — AB (ref 3.5–5.0)
ALK PHOS: 479 U/L — AB (ref 38–126)
ALT: 45 U/L (ref 17–63)
ANION GAP: 11 (ref 5–15)
AST: 67 U/L — AB (ref 15–41)
BILIRUBIN TOTAL: 1.5 mg/dL — AB (ref 0.3–1.2)
BUN: 16 mg/dL (ref 6–20)
CALCIUM: 9.3 mg/dL (ref 8.9–10.3)
CO2: 21 mmol/L — AB (ref 22–32)
CREATININE: 1.18 mg/dL (ref 0.61–1.24)
Chloride: 110 mmol/L (ref 101–111)
GFR calc Af Amer: >60 mL/min (ref >60)
GFR calc non Af Amer: >60 mL/min (ref >60)
GLUCOSE: 93 mg/dL (ref 65–99)
POTASSIUM: 4.6 mmol/L (ref 3.5–5.1)
SODIUM: 142 mmol/L (ref 135–145)
Total Protein: 6.1 g/dL — ABNORMAL LOW (ref 6.5–8.1)

## 2015-03-30 LAB — PROTIME-INR
INR: 2.94 — ABNORMAL HIGH (ref 0.00–1.49)
Prothrombin Time: 30.1 seconds — ABNORMAL HIGH (ref 11.6–15.2)

## 2015-03-30 LAB — CK: CK TOTAL: 60 U/L (ref 49–397)

## 2015-03-30 MED ORDER — CEPHALEXIN 500 MG PO CAPS: 500.0000 mg | ORAL_CAPSULE | Freq: Four times a day (QID) | ORAL | Status: DC

## 2015-03-30 MED ORDER — DEXTROSE 5 % IV SOLN
1.0000 g | Freq: Once | INTRAVENOUS | Status: AC
  Administered 2015-03-30: 1 g via INTRAVENOUS
  Filled 2015-03-30: qty 10

## 2015-03-30 NOTE — ED Provider Notes (Signed)
CSN: [id number]     Arrival date & time 03/30/15  0813 History   First MD Initiated Contact with Patient 03/30/15 1041     Chief Complaint  Patient presents with  . urine discoloration    Jonathan Reed is a 70 y.o. male with a history of CHF, paroxysmal atrial fibrillation, CVA, hypertension, and liver cancer who presents to the emergency department complaining of dark colored urine for the past 2 days. Patient reports he has a history of liver cancer and last received radiation therapy September 2016. He is not currently undergoing therapy for his liver cancer. His oncologist is Dr. Marin Olp.  He is currently on warfarin for paroxysmal atrial fibrillation. He denies any abdominal pain. He reports his abdomen is at its baseline. He denies any dysuria, urinary frequency or urinary urgency. He denies fevers, abdominal pain, nausea, vomiting, diarrhea, changes to his bowel habits, changes to his medications, chest pain, coughing, shortness of breath, headaches, or muscle aches.  (Consider location/radiation/quality/duration/timing/severity/associated sxs/prior Treatment) HPI  Past Medical History  Diagnosis Date  . CHF (congestive heart failure) ([place])     with ICD (f/u with [facility])  . CVA (cerebral infarction) 2011    No residual Sx  . CAD (coronary artery disease) 2000  . HTN (hypertension)   . Nausea alone 01/06/2013  . Abdominal pain, unspecified site 01/06/2013  . Stroke ([place])   . Tooth ache 01/04/2014  . Carcinoid syndrome ([place])   . Metastatic malignant neuroendocrine tumor to liver ([place])   . Metastasis to liver ([place]) 10/06/2013  . Bronchitis 09/23/2014  . Gout    Past Surgical History  Procedure Laterality Date  . Cardiac defibrillator placement    . Inguinal hernia repair Bilateral   . Sinus surgery with instatrak    . Colonoscopy  1980    colon polyps, no f/u since.    Family History  Problem Relation Age of Onset  . Stroke Mother   . Diabetes Father     diabetic coma  .  Diabetes Sister   . Cancer Sister     intestinal ca?  . Prostate cancer Brother     prostate  . Breast cancer Maternal Aunt   . Clotting disorder Brother   . Heart disease Brother   . Kidney disease Brother   . Stroke Sister   . Hypertension Daughter    Social History  Substance Use Topics  . Smoking status: Never Smoker   . Smokeless tobacco: Never Used  . Alcohol Use: No    Review of Systems  Constitutional: Negative for fever and chills.  HENT: Negative for congestion and sore throat.   Eyes: Negative for visual disturbance.  Respiratory: Negative for cough, shortness of breath and wheezing.   Cardiovascular: Negative for chest pain and palpitations.  Gastrointestinal: Negative for nausea, vomiting, abdominal pain, diarrhea, constipation and blood in stool.  Genitourinary: Negative for dysuria, urgency, frequency, hematuria, flank pain, decreased urine volume, difficulty urinating, penile pain and testicular pain.       Urine discoloration.   Musculoskeletal: Negative for myalgias, back pain, arthralgias and neck pain.  Skin: Negative for rash.  Neurological: Negative for headaches.  Psychiatric/Behavioral: Negative for confusion.      Allergies  Penicillins  Home Medications   Prior to Admission medications   Medication Sig Start Date End Date Taking? Authorizing Provider  lisinopril (PRINIVIL,ZESTRIL) 20 MG tablet Take 1 tablet (20 mg total) by mouth daily. 11/16/14 11/16/15 Yes Maryellen Pile, MD  metoprolol succinate (TOPROL-XL)  50 MG 24 hr tablet Take 1 tablet (50 mg total) by mouth daily. Take with or immediately following a meal. Patient taking differently: Take 100 mg by mouth daily. Take with or immediately following a meal. 06/29/13  Yes Blain Pais, MD  naproxen sodium (ANAPROX) 220 MG tablet Take 220 mg by mouth 2 (two) times daily as needed (pain).    Yes Historical Provider, MD  warfarin (COUMADIN) 5 MG tablet Take 1/2 tablet on Mondays/Thursdays;  take 1 tablet all other days. Patient taking differently: Take 1/2 tablet on Tuesday and Thursday, and 1 tablet on all other days 01/09/15  Yes Oval Linsey, MD  cephALEXin (KEFLEX) 500 MG capsule Take 1 capsule (500 mg total) by mouth 4 (four) times daily. 03/30/15   Waynetta Pean, PA-C  colchicine (COLCRYS) 0.6 MG tablet Take 1 tablet (0.6 mg total) by mouth as needed (For acute gout flare up.). Take 2 tabs (1.2 mg) then 1 tab (0.6 mg) one hour later. Patient not taking: Reported on 03/30/2015 11/24/14   Zada Finders, MD  oxyCODONE (OXY IR/ROXICODONE) 5 MG immediate release tablet Take 1 tablet (5 mg total) by mouth every 6 (six) hours as needed for moderate pain or severe pain. Patient not taking: Reported on 03/30/2015 09/18/14   Jaynee Eagles, PA-C   BP 130/91 mmHg  Pulse 71  Temp(Src) 97.5 F (36.4 C) (Oral)  Resp 16  SpO2 99% Physical Exam  Constitutional: He is oriented to person, place, and time. He appears well-developed and well-nourished. No distress.   Nontoxic appearing.  HENT:  Head: Normocephalic and atraumatic.  Right Ear: External ear normal.  Left Ear: External ear normal.  Mouth/Throat: Oropharynx is clear and moist.  Eyes: Conjunctivae are normal. Pupils are equal, round, and reactive to light. Right eye exhibits no discharge. Left eye exhibits no discharge.  Neck: Normal range of motion. Neck supple. No JVD present. No tracheal deviation present.  Cardiovascular: Normal rate, regular rhythm, normal heart sounds and intact distal pulses.  Exam reveals no gallop and no friction rub.   No murmur heard. Pulmonary/Chest: Effort normal and breath sounds normal. No respiratory distress. He has no wheezes. He has no rales.  Abdominal: Soft. Bowel sounds are normal. There is no tenderness. There is no guarding.   Abdomen is soft. Bowel sounds are present. Patient has hepatomegaly. No ascites. No fluid wave. No tenderness to palpation.  Musculoskeletal: He exhibits no edema or  tenderness.   No lower extremity edema or tenderness.  Lymphadenopathy:    He has no cervical adenopathy.  Neurological: He is alert and oriented to person, place, and time. Coordination normal.  Skin: Skin is warm and dry. No rash noted. He is not diaphoretic. No erythema. No pallor.  Psychiatric: He has a normal mood and affect. His behavior is normal.  Nursing note and vitals reviewed.   ED Course  Procedures (including critical care time) Labs Review Labs Reviewed  URINALYSIS, ROUTINE W REFLEX MICROSCOPIC (NOT AT [facility]) - Abnormal; Notable for the following:    Color, Urine RED (*)    APPearance TURBID (*)    Hgb urine dipstick LARGE (*)    Bilirubin Urine LARGE (*)    Ketones, ur 15 (*)    Protein, ur 100 (*)    Nitrite POSITIVE (*)    Leukocytes, UA SMALL (*)    All other components within normal limits  COMPREHENSIVE METABOLIC PANEL - Abnormal; Notable for the following:    CO2 21 (*)  Total Protein 6.1 (*)    Albumin 2.6 (*)    AST 67 (*)    Alkaline Phosphatase 479 (*)    Total Bilirubin 1.5 (*)    All other components within normal limits  CBC WITH DIFFERENTIAL/PLATELET - Abnormal; Notable for the following:    RDW 17.1 (*)    All other components within normal limits  PROTIME-INR - Abnormal; Notable for the following:    Prothrombin Time 30.1 (*)    INR 2.94 (*)    All other components within normal limits  URINE MICROSCOPIC-ADD ON - Abnormal; Notable for the following:    Squamous Epithelial / LPF 0-5 (*)    Bacteria, UA MANY (*)    Crystals CA OXALATE CRYSTALS (*)    All other components within normal limits  URINE CULTURE  CK  GC/CHLAMYDIA PROBE AMP ([place]) NOT AT [facility]    Imaging Review No results found. I have personally reviewed and evaluated these images and lab results as part of my medical decision-making.   EKG Interpretation None      Filed Vitals:   03/30/15 1245 03/30/15 1315 03/30/15 1345 03/30/15 1415  BP: 119/91 122/88 120/93  130/91  Pulse: 73 63 68 71  Temp:      TempSrc:      Resp:      SpO2: 99% 96% 97% 99%     MDM   Meds given in ED:  Medications  cefTRIAXone (ROCEPHIN) 1 g in dextrose 5 % 50 mL IVPB (0 g Intravenous Stopped 03/30/15 1359)    Discharge Medication List as of 03/30/2015  3:03 PM    START taking these medications   Details  cephALEXin (KEFLEX) 500 MG capsule Take 1 capsule (500 mg total) by mouth 4 (four) times daily., Starting 03/30/2015, Until Discontinued, Print        Final diagnoses:  UTI (lower urinary tract infection)  Serum total bilirubin elevated   This  is a 70 y.o. male with a history of CHF, paroxysmal atrial fibrillation, CVA, hypertension, and liver cancer who presents to the emergency department complaining of dark colored urine for the past 2 days. Patient reports he has a history of liver cancer and last received radiation therapy September 2016. He denies fevers, dysuria, abdominal pain, nausea, vomiting or diarrhea. On exam the patient is afebrile nontoxic appearing. His abdomen is soft and nontender to palpation. Urinalysis revealed and nitrite positive urine with small leukocytes andmany bacteria. It has a 0-5 white blood cells on urine microscopic and on. Urine sent for culture. Urine also indicates large hemoglobin.  CMP reveals baseline liver enzymes with a total bilirubin of 1.5 which is elevated. CBC is unremarkable. No leukocytosis. INR is therapeutic at 2.94. CK is within normal limits. Urine color is likely mostly related to his hemoglobin and his urine. I doubt a bilirubin of 1.5 is contributing to this color. Urine sent for culture. Will see with a gram of Rocephin in the emergency department and discharged with Keflex. Urine gonorrhea and chlamydia were also sent to rule out gonorrhea and chlamydia with his urinalysis. I encouraged him to follow-up closely with his primary care provider and his oncologist. I advised the patient to follow-up with their primary  care provider this week. I advised the patient to return to the emergency department with new or worsening symptoms or new concerns. The patient verbalized understanding and agreement with plan.    This patient was discussed with and evaluated by Dr. Lita Mains who agrees  with assessment and plan.    Waynetta Pean, PA-C 03/30/15 2027  Julianne Rice, MD 03/31/15 [phone]

## 2015-03-30 NOTE — ED Notes (Signed)
Pt report that he has noticed a dark brown cloudy urine for several days. Pt denies any symptoms. States that he has had liver cancer and has went thought chemo and radiation. States that he has had changes in urine color since.

## 2015-03-30 NOTE — ED Notes (Signed)
Pt is in stable condition upon d/c and ambulates from ED. 

## 2015-03-30 NOTE — Discharge Instructions (Signed)
Urinary Tract Infection Urinary tract infections (UTIs) can develop anywhere along your urinary tract. Your urinary tract is your body's drainage system for removing wastes and extra water. Your urinary tract includes two kidneys, two ureters, a bladder, and a urethra. Your kidneys are a pair of bean-shaped organs. Each kidney is about the size of your fist. They are located below your ribs, one on each side of your spine. CAUSES Infections are caused by microbes, which are microscopic organisms, including fungi, viruses, and bacteria. These organisms are so small that they can only be seen through a microscope. Bacteria are the microbes that most commonly cause UTIs. SYMPTOMS  Symptoms of UTIs may vary by age and gender of the patient and by the location of the infection. Symptoms in young women typically include a frequent and intense urge to urinate and a painful, burning feeling in the bladder or urethra during urination. Older women and men are more likely to be tired, shaky, and weak and have muscle aches and abdominal pain. A fever may mean the infection is in your kidneys. Other symptoms of a kidney infection include pain in your back or sides below the ribs, nausea, and vomiting. DIAGNOSIS To diagnose a UTI, your caregiver will ask you about your symptoms. Your caregiver will also ask you to provide a urine sample. The urine sample will be tested for bacteria and white blood cells. White blood cells are made by your body to help fight infection. TREATMENT  Typically, UTIs can be treated with medication. Because most UTIs are caused by a bacterial infection, they usually can be treated with the use of antibiotics. The choice of antibiotic and length of treatment depend on your symptoms and the type of bacteria causing your infection. HOME CARE INSTRUCTIONS  If you were prescribed antibiotics, take them exactly as your caregiver instructs you. Finish the medication even if you feel better after  you have only taken some of the medication.  Drink enough water and fluids to keep your urine clear or pale yellow.  Avoid caffeine, tea, and carbonated beverages. They tend to irritate your bladder.  Empty your bladder often. Avoid holding urine for long periods of time.  Empty your bladder before and after sexual intercourse.  After a bowel movement, women should cleanse from front to back. Use each tissue only once. SEEK MEDICAL CARE IF:   You have back pain.  You develop a fever.  Your symptoms do not begin to resolve within 3 days. SEEK IMMEDIATE MEDICAL CARE IF:   You have severe back pain or lower abdominal pain.  You develop chills.  You have nausea or vomiting.  You have continued burning or discomfort with urination. MAKE SURE YOU:   Understand these instructions.  Will watch your condition.  Will get help right away if you are not doing well or get worse.   This information is not intended to replace advice given to you by your health care provider. Make sure you discuss any questions you have with your health care provider.   Document Released: 12/12/2004 Document Revised: 11/23/2014 Document Reviewed: 04/12/2011 Elsevier Interactive Patient Education 2016 Else[employer] Warfarin: What You Need to Know Warfarin is an anticoagulant. Anticoagulants help prevent the formation of blood clots. They also help stop the growth of blood clots. Warfarin is sometimes referred to as a "blood thinner."  Normally, when body tissues are cut or damaged, the blood clots in order to prevent blood loss. Sometimes clots form inside your blood vessels  and obstruct the flow of blood through your circulatory system (thrombosis). These clots may travel through your bloodstream and become lodged in smaller blood vessels in your brain, which can cause a stroke, or in your lungs (pulmonary embolism). WHO SHOULD USE WARFARIN? Warfarin is prescribed for people at risk of developing harmful  blood clots:  People with surgically implanted mechanical heart valves, irregular heart rhythms called atrial fibrillation, and certain clotting disorders.  People who have developed harmful blood clotting in the past, including those who have had a stroke or a pulmonary embolism, or thrombosis in their legs (deep vein thrombosis [DVT]).  People with an existing blood clot, such as a pulmonary embolism. WARFARIN DOSING Warfarin tablets come in different strengths. Each tablet strength is a different color, with the amount of warfarin (in milligrams) clearly printed on the tablet. If the color of your tablet is different than usual when you receive a new prescription, report it immediately to your pharmacist or health care provider. WARFARIN MONITORING The goal of warfarin therapy is to lessen the clotting tendency of blood but not prevent clotting completely. Your health care provider will monitor the anticoagulation effect of warfarin closely and adjust your dose as needed. For your safety, blood tests called prothrombin time (PT) or international normalized ratio (INR) are used to measure the effects of warfarin. Both of these tests can be done with a finger stick or a blood draw. The longer it takes the blood to clot, the higher the PT or INR. Your health care provider will inform you of your "target" PT or INR range. If, at any time, your PT or INR is above the target range, there is a risk of bleeding. If your PT or INR is below the target range, there is a risk of clotting. Whether you are started on warfarin while you are in the hospital or in your health care provider's office, you will need to have your PT or INR checked within one week of starting the medicine. Initially, some people are asked to have their PT or INR checked as much as twice a week. Once you are on a stable maintenance dose, the PT or INR is checked less often, usually once every 2 to 4 weeks. The warfarin dose may be adjusted  if the PT or INR is not within the target range. It is important to keep all laboratory and health care provider follow-up appointments. Not keeping appointments could result in a chronic or permanent injury, pain, or disability because warfarin is a medicine that requires close monitoring. WHAT ARE THE SIDE EFFECTS OF WARFARIN?  Too much warfarin can cause bleeding (hemorrhage) from any part of the body. This may include bleeding from the gums, blood in the urine, bloody or dark stools, a nosebleed that is not easily stopped, coughing up blood, or vomiting blood.  Too little warfarin can increase the risk of blood clots.  Too little or too much warfarin can also increase the risk of a stroke.  Warfarin use may cause a skin rash or irritation, an unusual fever, continual nausea or stomach upset, or severe pain in your joints or back. SPECIAL PRECAUTIONS WHILE TAKING WARFARIN Warfarin should be taken exactly as directed. It is very important to take warfarin as directed since bleeding or blood clots could result in chronic or permanent injury, pain, or disability.  Take your medicine at the same time every day. If you forget to take your dose, you can take it if it is  within 6 hours of when it was due.  Do not change the dose of warfarin on your own to make up for missed or extra doses.  If you miss more than 2 doses in a row, you should contact your health care provider for advice. Avoid situations that cause bleeding. You may have a tendency to bleed more easily than usual while taking warfarin. The following actions can limit bleeding:  Using a softer toothbrush.  Flossing with waxed floss rather than unwaxed floss.  Shaving with an Copy rather than a blade.  Limiting the use of sharp objects.  Avoiding potentially harmful activities, such as contact sports. Warfarin and Pregnancy or Breastfeeding  Warfarin is not advised during the first trimester of pregnancy due to an  increased risk of birth defects. In certain situations, a woman may take warfarin after her first trimester of pregnancy. A woman who becomes pregnant or plans to become pregnant while taking warfarin should notify her health care provider immediately.  Although warfarin does not pass into breast milk, a woman who wishes to breastfeed while taking warfarin should also consult with her health care provider. Alcohol, Smoking, and Illicit Drug Use  Alcohol affects how warfarin works in the body. It is best to avoid alcoholic drinks or consume very small amounts while taking warfarin. In general, alcohol intake should be limited to 1 oz (30 mL) of liquor, 6 oz (180 mL) of wine, or 12 oz (360 mL) of beer each day. Notify your health care provider if you change your alcohol intake.  Smoking affects how warfarin works. It is best to avoid smoking while taking warfarin. Notify your health care provider if you change your smoking habits.  It is best to avoid all illicit drugs while taking warfarin since there are few studies that show how warfarin interacts with these drugs. Other Medicines and Dietary Supplements Many prescription and over-the-counter medicines can interfere with warfarin. Be sure all of your health care providers know you are taking warfarin. Notify your health care provider who prescribed warfarin for you or your pharmacist before starting or stopping any new medicines, including over-the-counter vitamins, dietary supplements, and pain medicines. Your warfarin dose may need to be adjusted. Some common over-the-counter medicines that may increase the risk of bleeding while taking warfarin include:   Acetaminophen.  Aspirin.  Nonsteroidal anti-inflammatory medicines (NSAIDs), such as ibuprofen or naproxen.  Vitamin E. Dietary Considerations  Foods that have moderate or high amounts of vitamin K can interfere with warfarin. Avoid major changes in your diet or notify your health care  provider before changing your diet. Eat a consistent amount of foods that have moderate or high amounts of vitamin K. Eating less foods containing vitamin K can increase the risk of bleeding. Eating more foods containing vitamin K can increase the risk of blood clots. Additional questions about dietary considerations can be discussed with a dietitian. Foods that are very high in vitamin K:  Greens, such as Swiss chard and beet, collard, mustard, or turnip greens (fresh or frozen, cooked).  Kale (fresh or frozen, cooked).  Parsley (raw).  Spinach (cooked). Foods that are high in vitamin K:  Asparagus (frozen, cooked).  Broccoli.  Bok choy (cooked).  Brussels sprouts (fresh or frozen, cooked).  Cabbage (cooked).   Coleslaw. Foods that are moderately high in vitamin K:  Blueberries.  Black-eyed peas.  Endive (raw).  Green leaf lettuce (raw).  Green scallions (raw).  Kale (raw).  Okra (frozen, cooked).  Plantains (  fried).  Romaine lettuce (raw).  Sauerkraut (canned).  Spinach (raw). CALL YOUR CLINIC OR HEALTH CARE PROVIDER IF YOU:  Plan to have any surgery or procedure.  Feel sick, especially if you have diarrhea or vomiting.  Experience or anticipate any major changes in your diet.  Start or stop a prescription or over-the-counter medicine.  Become, plan to become, or think you may be pregnant.  Are having heavier than usual menstrual periods.  Have had a fall, accident, or any symptoms of bleeding or unusual bruising.  Develop an unusual fever. CALL 911 IN THE [place] OR GO TO THE EMERGENCY DEPARTMENT IF YOU:   Think you may be having an allergic reaction to warfarin. The signs of an allergic reaction could include itching, rash, hives, swelling, chest tightness, or trouble breathing.  See signs of blood in your urine. The signs could include reddish, pinkish, or tea-colored urine.  See signs of blood in your stools. The signs could include bright red  or black stools.  Vomit or cough up blood. In these instances, the blood could have either a bright red or a "coffee-grounds" appearance.  Have bleeding that will not stop after applying pressure for 30 minutes such as cuts, nosebleeds, or other injuries.  Have severe pain in your joints or back.  Have a new and severe headache.  Have sudden weakness or numbness of your face, arm, or leg, especially on one side of your body.  Have sudden confusion or trouble understanding.  Have sudden trouble seeing in one or both eyes.  Have sudden trouble walking, dizziness, loss of balance, or coordination.  Have trouble speaking or understanding (aphasia).   This information is not intended to replace advice given to you by your health care provider. Make sure you discuss any questions you have with your health care provider.   Document Released: 03/04/2005 Document Revised: 03/25/2014 Document Reviewed: 08/28/2012 Elsevier Interactive Patient Education [employer].

## 2015-03-31 LAB — GC/CHLAMYDIA PROBE AMP (~~LOC~~) NOT AT ARMC
Chlamydia: NEGATIVE
Neisseria Gonorrhea: NEGATIVE

## 2015-03-31 LAB — URINE CULTURE: Culture: NO GROWTH

## 2015-04-06 ENCOUNTER — Telehealth: Payer: Self-pay | Admitting: Internal Medicine

## 2015-04-06 NOTE — Telephone Encounter (Signed)
Call to patient to confirm appointment for 04/06/14 at 2:15 lmtcb

## 2015-04-07 ENCOUNTER — Ambulatory Visit (INDEPENDENT_AMBULATORY_CARE_PROVIDER_SITE_OTHER): Payer: Medicare Other | Admitting: Pharmacist

## 2015-04-07 ENCOUNTER — Ambulatory Visit (INDEPENDENT_AMBULATORY_CARE_PROVIDER_SITE_OTHER): Payer: Medicare Other | Admitting: Internal Medicine

## 2015-04-07 ENCOUNTER — Encounter: Payer: Self-pay | Admitting: Internal Medicine

## 2015-04-07 VITALS — BP 138/92 | HR 82 | Temp 97.8°F | Ht 71.0 in | Wt 177.4 lb

## 2015-04-07 DIAGNOSIS — I482 Chronic atrial fibrillation, unspecified: Secondary | ICD-10-CM

## 2015-04-07 DIAGNOSIS — R8299 Other abnormal findings in urine: Secondary | ICD-10-CM | POA: Diagnosis present

## 2015-04-07 DIAGNOSIS — R319 Hematuria, unspecified: Secondary | ICD-10-CM | POA: Diagnosis not present

## 2015-04-07 DIAGNOSIS — N390 Urinary tract infection, site not specified: Secondary | ICD-10-CM | POA: Insufficient documentation

## 2015-04-07 DIAGNOSIS — Z8673 Personal history of transient ischemic attack (TIA), and cerebral infarction without residual deficits: Secondary | ICD-10-CM

## 2015-04-07 DIAGNOSIS — I639 Cerebral infarction, unspecified: Secondary | ICD-10-CM

## 2015-04-07 DIAGNOSIS — Z7901 Long term (current) use of anticoagulants: Secondary | ICD-10-CM

## 2015-04-07 DIAGNOSIS — N3001 Acute cystitis with hematuria: Secondary | ICD-10-CM

## 2015-04-07 LAB — POCT INR: INR: 2.7

## 2015-04-07 NOTE — Progress Notes (Signed)
Subjective:    Patient ID: Jonathan Reed, male    DOB: 10-Feb-1946, 70 y.o.   MRN: [id number]  HPI Comments: Jonathan Reed is a 70 year old male with PMH as below here for ED follow-up.  He was in the ED for dark urine and found to have a UTI.  He has no other symptoms and urine is now normal in color.     Past Medical History  Diagnosis Date  . CHF (congestive heart failure) ([place])     with ICD (f/u with [facility])  . CVA (cerebral infarction) 2011    No residual Sx  . CAD (coronary artery disease) 2000  . HTN (hypertension)   . Nausea alone 01/06/2013  . Abdominal pain, unspecified site 01/06/2013  . Stroke ([place])   . Tooth ache 01/04/2014  . Carcinoid syndrome ([place])   . Metastatic malignant neuroendocrine tumor to liver ([place])   . Metastasis to liver ([place]) 10/06/2013  . Bronchitis 09/23/2014  . Gout    Current Outpatient Prescriptions on File Prior to Visit  Medication Sig Dispense Refill  . cephALEXin (KEFLEX) 500 MG capsule Take 1 capsule (500 mg total) by mouth 4 (four) times daily. 40 capsule 0  . colchicine (COLCRYS) 0.6 MG tablet Take 1 tablet (0.6 mg total) by mouth as needed (For acute gout flare up.). Take 2 tabs (1.2 mg) then 1 tab (0.6 mg) one hour later. 15 tablet 0  . lisinopril (PRINIVIL,ZESTRIL) 20 MG tablet Take 1 tablet (20 mg total) by mouth daily. 30 tablet 11  . metoprolol succinate (TOPROL-XL) 50 MG 24 hr tablet Take 1 tablet (50 mg total) by mouth daily. Take with or immediately following a meal. (Patient taking differently: Take 100 mg by mouth daily. Take with or immediately following a meal.) 30 tablet 2  . naproxen sodium (ANAPROX) 220 MG tablet Take 220 mg by mouth 2 (two) times daily as needed (pain).     Marland Kitchen oxyCODONE (OXY IR/ROXICODONE) 5 MG immediate release tablet Take 1 tablet (5 mg total) by mouth every 6 (six) hours as needed for moderate pain or severe pain. 30 tablet 0  . warfarin (COUMADIN) 5 MG tablet Take 1/2 tablet on Mondays/Thursdays; take 1 tablet all  other days. (Patient taking differently: Take 1/2 tablet on Tuesday and Thursday, and 1 tablet on all other days) 30 tablet 2   No current facility-administered medications on file prior to visit.    Review of Systems  Constitutional: Negative for fever, chills and appetite change.  Respiratory: Negative for shortness of breath.   Cardiovascular: Negative for chest pain, palpitations and leg swelling.  Gastrointestinal: Negative for nausea, vomiting and diarrhea.  Genitourinary: Negative for dysuria, urgency, frequency and hematuria.       Filed Vitals:   04/07/15 1450  BP: 138/92  Pulse: 82  Temp: 97.8 F (36.6 C)  TempSrc: Oral  Height: 5\' 11"  (1.803 m)  Weight: 177 lb 6.4 oz (80.468 kg)  SpO2: 100%     Objective:   Physical Exam  Constitutional: He is oriented to person, place, and time. He appears well-developed. No distress.  HENT:  Head: Normocephalic and atraumatic.  Mouth/Throat: Oropharynx is clear and moist. No oropharyngeal exudate.  Eyes: Conjunctivae and EOM are normal. Pupils are equal, round, and reactive to light. Right eye exhibits no discharge. Left eye exhibits no discharge. No scleral icterus.  Neck: Neck supple.  Cardiovascular: Normal rate, regular rhythm and normal heart sounds.  Exam reveals no gallop and  no friction rub.   No murmur heard. Pulmonary/Chest: Effort normal and breath sounds normal. No respiratory distress. He has no wheezes. He has no rales.  Abdominal: Soft. Bowel sounds are normal. He exhibits no distension. There is tenderness. There is no rebound.  Mild RUQ TTP.  Musculoskeletal: Normal range of motion. He exhibits no edema or tenderness.  No CVAT.  Neurological: He is alert and oriented to person, place, and time. No cranial nerve deficit.  Skin: Skin is warm. He is not diaphoretic.  Psychiatric: He has a normal mood and affect. His behavior is normal. Judgment and thought content normal.  Vitals reviewed.           Assessment & Plan:  Please see problem based charting for A&P.

## 2015-04-07 NOTE — Patient Instructions (Signed)
1. Please come back to clinic in 4-5 weeks so we can repeat your urinalysis.  Come back sooner if you start to feel unwell or notice dark or bloody urine again.  Please finish your antibiotics and be sure to follow-up with Dr. Marin Olp as well.    2. Please take all medications as prescribed.    3. If you have worsening of your symptoms or new symptoms arise, please call the clinic [phone] or go to the ER immediately if symptoms are severe.

## 2015-04-07 NOTE — Assessment & Plan Note (Addendum)
Assessment:  "mud" colored urine + for blood, nitrites and leukocytes c/w UTI.  Patient's INR previously supratherapeutic at 6 but today it is in therapeutic range.  Dark urine has resolved.  He has no symptoms and feels well.  Interestingly, urine cx is neg. Plan:  Finish abx that was prescribed by ED.  Return to clinic in 4-5 weeks to repeat UA.  If blood still present will refer to urology for cystoscopy.

## 2015-04-07 NOTE — Patient Instructions (Signed)
Patient instructed to take medications as defined in the Anti-coagulation Track section of this encounter.  Patient instructed to take today's dose.  Patient verbalized understanding of these instructions.    

## 2015-04-07 NOTE — Progress Notes (Signed)
Anti-Coagulation Progress Note  Jonathan Reed is a 70 y.o. male who is currently on an anti-coagulation regimen.    RECENT RESULTS: Recent results are below, the most recent result is correlated with a dose of 30 mg. per week: Lab Results  Component Value Date   INR 2.70 04/07/2015   INR 2.94* 03/30/2015   INR 6.00 03/27/2015   PROTIME 30.0* 05/15/2012    ANTI-COAG DOSE: Anticoagulation Dose Instructions as of 04/07/2015      [name] Tue Wed Thu Fri Sat   New Dose 5 mg 5 mg 2.5 mg 5 mg 5 mg 2.5 mg 5 mg       ANTICOAG SUMMARY: Anticoagulation Episode Summary    Current INR goal 2.0-3.0  Next INR check 04/24/2015  INR from last check 2.70 (04/07/2015)  Weekly max dose   Target end date Indefinite  [facility] check [facility]  Preferred lab   Send INR reminders to    Indications  Long term (current) use of anticoagulants [Z79.01] CVA (cerebral infarction) [I63.9] Chronic atrial fibrillation ([place]) [I48.2]        Comments         ANTICOAG TODAY: Anticoagulation Summary as of 04/07/2015    INR goal 2.0-3.0  Selected INR 2.70 (04/07/2015)  Next INR check 04/24/2015  Target end date Indefinite   Indications  Long term (current) use of anticoagulants [Z79.01] CVA (cerebral infarction) [I63.9] Chronic atrial fibrillation ([place]) [I48.2]      Anticoagulation Episode Summary    [facility] check [facility]   Preferred lab    Send INR reminders to    Comments       PATIENT INSTRUCTIONS: Patient Instructions  Patient instructed to take medications as defined in the Anti-coagulation Track section of this encounter.  Patient instructed to take today's dose.  Patient verbalized understanding of these instructions.       FOLLOW-UP Return in 2 weeks (on 04/24/2015) for Follow up INR at 1000h.  [name], III Pharm.D., CACP

## 2015-04-10 ENCOUNTER — Ambulatory Visit: Payer: Medicare Other

## 2015-04-10 NOTE — Progress Notes (Signed)
Internal Medicine Clinic Attending  Case discussed with Dr. Wilson at the time of the visit.  We reviewed the resident's history and exam and pertinent patient test results.  I agree with the assessment, diagnosis, and plan of care documented in the resident's note.  

## 2015-04-12 ENCOUNTER — Other Ambulatory Visit: Payer: Self-pay | Admitting: Hematology & Oncology

## 2015-04-12 ENCOUNTER — Other Ambulatory Visit (HOSPITAL_COMMUNITY): Payer: Self-pay | Admitting: Interventional Radiology

## 2015-04-12 DIAGNOSIS — C7B8 Other secondary neuroendocrine tumors: Secondary | ICD-10-CM

## 2015-04-13 ENCOUNTER — Other Ambulatory Visit (HOSPITAL_COMMUNITY): Payer: Self-pay | Admitting: Interventional Radiology

## 2015-04-13 DIAGNOSIS — C787 Secondary malignant neoplasm of liver and intrahepatic bile duct: Secondary | ICD-10-CM

## 2015-04-18 ENCOUNTER — Telehealth: Payer: Self-pay | Admitting: Radiology

## 2015-04-18 NOTE — Telephone Encounter (Signed)
Quadre Dabdoub (patient's daughter) states that PET scan & app't with Dr Marin Olp are scheduled for next week.  Prefers to wait until after that time to schedule an appointment with Dr. Annamaria Boots.  She states that her father becomes angry if he feels pressured by too many appointments.  She also states that he does not want a repeat Y-90 treatment.    Johneisha Broaden Riki Rusk, RN 04/18/2015 9:38 AM

## 2015-04-24 ENCOUNTER — Ambulatory Visit (INDEPENDENT_AMBULATORY_CARE_PROVIDER_SITE_OTHER): Payer: Medicare Other | Admitting: Pharmacist

## 2015-04-24 DIAGNOSIS — Z7901 Long term (current) use of anticoagulants: Secondary | ICD-10-CM

## 2015-04-24 DIAGNOSIS — Z8673 Personal history of transient ischemic attack (TIA), and cerebral infarction without residual deficits: Secondary | ICD-10-CM

## 2015-04-24 DIAGNOSIS — I482 Chronic atrial fibrillation, unspecified: Secondary | ICD-10-CM

## 2015-04-24 DIAGNOSIS — I639 Cerebral infarction, unspecified: Secondary | ICD-10-CM

## 2015-04-24 LAB — POCT INR: INR: 4.4

## 2015-04-24 NOTE — Progress Notes (Signed)
Anti-Coagulation Progress Note  Jonathan Reed is a 70 y.o. male who is currently on an anti-coagulation regimen.    RECENT RESULTS: Recent results are below, the most recent result is correlated with a dose of 30 mg. per week:  Patient having repeat PET scan of liver performed 8-FEB-17 and seeing new oncologist Dr. Charlesetta Garibaldi on 9-FEB-17 he states. Patient has cited on problem list a neuroendocrine tumor with mets to liver. Suspicious that this may be impacting upon functional synthesis of clotting factors from his liver accounting from this increased hypoprothrombinemic response. Last LFTs were elevated (during workup for hematuria in January '17) and his albumin level was low.  Lab Results  Component Value Date   INR 4.40 04/24/2015   INR 2.70 04/07/2015   INR 2.94* 03/30/2015   PROTIME 30.0* 05/15/2012    ANTI-COAG DOSE: Anticoagulation Dose Instructions as of 04/24/2015      [name] Tue Wed Thu Fri Sat   New Dose 5 mg 2.5 mg 2.5 mg 2.5 mg 5 mg 2.5 mg 5 mg    Description        OMIT today's dose (Monday 6-FEB-17). Resume as shown/provided tomorrow Tuesday 7-FEB-17.        ANTICOAG SUMMARY: Anticoagulation Episode Summary    Current INR goal 2.0-3.0  Next INR check 05/15/2015  INR from last check 4.40! (04/24/2015)  Weekly max dose   Target end date Indefinite  [facility]  Preferred lab   Send INR reminders to    Indications  Long term (current) use of anticoagulants [Z79.01] CVA (cerebral infarction) [I63.9] Chronic atrial fibrillation ([place]) [I48.2]        Comments         ANTICOAG TODAY: Anticoagulation Summary as of 04/24/2015    INR goal 2.0-3.0  Selected INR 4.40! (04/24/2015)  Next INR check 05/15/2015  Target end date Indefinite   Indications  Long term (current) use of anticoagulants [Z79.01] CVA (cerebral infarction) [I63.9] Chronic atrial fibrillation ([place]) [I48.2]      Anticoagulation Episode Summary    [facility] check location  [facility]   Preferred lab    Send INR reminders to    Comments       PATIENT INSTRUCTIONS: Patient Instructions  Patient instructed to take medications as defined in the Anti-coagulation Track section of this encounter.  Patient instructed to OMIT today's dose.  Patient verbalized understanding of these instructions.       FOLLOW-UP Return in 3 weeks (on 05/15/2015) for Follow up INR at 1030h.  [name], III Pharm.D., CACP

## 2015-04-24 NOTE — Patient Instructions (Signed)
Patient instructed to take medications as defined in the Anti-coagulation Track section of this encounter.  Patient instructed to OMIT today's dose.  Patient verbalized understanding of these instructions.    

## 2015-04-24 NOTE — Progress Notes (Signed)
INTERNAL MEDICINE TEACHING ATTENDING ADDENDUM - Serria Sloma M.D  Duration- indefinite, Indication- afib, INR- supratherapeutic. Agree with pharmacy recommendations as outlined in their note.     

## 2015-04-25 ENCOUNTER — Other Ambulatory Visit: Payer: Self-pay | Admitting: Hematology & Oncology

## 2015-04-25 ENCOUNTER — Encounter (HOSPITAL_COMMUNITY)
Admission: RE | Admit: 2015-04-25 | Discharge: 2015-04-25 | Disposition: A | Discharge status: Home / Self Care | Payer: Medicare Other | Source: Ambulatory Visit | Attending: Hematology & Oncology | Admitting: Hematology & Oncology

## 2015-04-25 DIAGNOSIS — C7A8 Other malignant neuroendocrine tumors: Secondary | ICD-10-CM | POA: Diagnosis not present

## 2015-04-25 DIAGNOSIS — C7B8 Other secondary neuroendocrine tumors: Secondary | ICD-10-CM

## 2015-04-25 LAB — GLUCOSE, CAPILLARY: Glucose-Capillary: 98 mg/dL (ref 65–99)

## 2015-04-25 MED ORDER — FLUDEOXYGLUCOSE F - 18 (FDG) INJECTION
8.7600 | Freq: Once | INTRAVENOUS | Status: AC | PRN
  Administered 2015-04-25: 8.76 via INTRAVENOUS

## 2015-04-27 ENCOUNTER — Encounter: Payer: Self-pay | Admitting: Hematology & Oncology

## 2015-04-27 ENCOUNTER — Telehealth: Payer: Self-pay | Admitting: *Deleted

## 2015-04-27 ENCOUNTER — Other Ambulatory Visit (HOSPITAL_BASED_OUTPATIENT_CLINIC_OR_DEPARTMENT_OTHER): Payer: Medicare Other

## 2015-04-27 ENCOUNTER — Ambulatory Visit (HOSPITAL_BASED_OUTPATIENT_CLINIC_OR_DEPARTMENT_OTHER): Payer: Medicare Other | Admitting: Hematology & Oncology

## 2015-04-27 ENCOUNTER — Ambulatory Visit (HOSPITAL_BASED_OUTPATIENT_CLINIC_OR_DEPARTMENT_OTHER): Payer: Medicare Other

## 2015-04-27 VITALS — BP 122/73 | HR 70 | Temp 97.9°F | Resp 16 | Ht 71.0 in | Wt 181.0 lb

## 2015-04-27 DIAGNOSIS — C7A8 Other malignant neuroendocrine tumors: Secondary | ICD-10-CM

## 2015-04-27 DIAGNOSIS — C7B8 Other secondary neuroendocrine tumors: Secondary | ICD-10-CM

## 2015-04-27 DIAGNOSIS — R160 Hepatomegaly, not elsewhere classified: Secondary | ICD-10-CM

## 2015-04-27 LAB — COMPREHENSIVE METABOLIC PANEL
ALBUMIN: 2.3 g/dL — AB (ref 3.5–5.0)
ALK PHOS: 464 U/L — AB (ref 40–150)
ALT: 40 U/L (ref 0–55)
ANION GAP: 9 meq/L (ref 3–11)
AST: 71 U/L — ABNORMAL HIGH (ref 5–34)
BILIRUBIN TOTAL: 3.84 mg/dL — AB (ref 0.20–1.20)
BUN: 13.6 mg/dL (ref 7.0–26.0)
CALCIUM: 9.1 mg/dL (ref 8.4–10.4)
CO2: 21 mEq/L — ABNORMAL LOW (ref 22–29)
Chloride: 109 mEq/L (ref 98–109)
Creatinine: 0.9 mg/dL (ref 0.7–1.3)
Glucose: 84 mg/dl (ref 70–140)
POTASSIUM: 4.3 meq/L (ref 3.5–5.1)
SODIUM: 139 meq/L (ref 136–145)
TOTAL PROTEIN: 6.3 g/dL — AB (ref 6.4–8.3)

## 2015-04-27 LAB — CBC WITH DIFFERENTIAL (CANCER CENTER ONLY)
BASO#: 0 10*3/uL (ref 0.0–0.2)
BASO%: 0.2 % (ref 0.0–2.0)
EOS ABS: 0 10*3/uL (ref 0.0–0.5)
EOS%: 0.6 % (ref 0.0–7.0)
HEMATOCRIT: 39.6 % (ref 38.7–49.9)
HEMOGLOBIN: 14 g/dL (ref 13.0–17.1)
LYMPH#: 0.9 10*3/uL (ref 0.9–3.3)
LYMPH%: 18.6 % (ref 14.0–48.0)
MCH: 30.5 pg (ref 28.0–33.4)
MCHC: 35.4 g/dL (ref 32.0–35.9)
MCV: 86 fL (ref 82–98)
MONO#: 0.8 10*3/uL (ref 0.1–0.9)
MONO%: 16 % — AB (ref 0.0–13.0)
NEUT#: 3.2 10*3/uL (ref 1.5–6.5)
NEUT%: 64.6 % (ref 40.0–80.0)
Platelets: 122 10*3/uL — ABNORMAL LOW (ref 145–400)
RBC: 4.59 10*6/uL (ref 4.20–5.70)
RDW: 18.6 % — ABNORMAL HIGH (ref 11.1–15.7)
WBC: 5 10*3/uL (ref 4.0–10.0)

## 2015-04-27 MED ORDER — LANREOTIDE ACETATE 120 MG/0.5ML ~~LOC~~ SOLN
120.0000 mg | Freq: Once | SUBCUTANEOUS | Status: AC
  Administered 2015-04-27: 120 mg via SUBCUTANEOUS
  Filled 2015-04-27: qty 120

## 2015-04-27 NOTE — Telephone Encounter (Signed)
Critical Value Total Bilirubin 3.84 Dr Marin Olp notified. No orders at this time.

## 2015-04-27 NOTE — Patient Instructions (Signed)
Lanreotide injection What is this medicine? LANREOTIDE (lan REE oh tide) is used to reduce blood levels of growth hormone in patients with a condition called acromegaly. It also works to slow or stop tumor growth in patients with gastroenteropancreatic neuroendocrine tumor (GEP-NET). This medicine may be used for other purposes; ask your health care provider or pharmacist if you have questions. What should I tell my health care provider before I take this medicine? They need to know if you have any of these conditions: -diabetes -gallbladder disease -heart disease -kidney disease -liver disease -an unusual or allergic reaction to lanreotide, other medicines, latex, foods, dyes, or preservatives -pregnant or trying to get pregnant -breast-feeding How should I use this medicine? This medicine is for injection under the skin. It is given by a health care professional in a hospital or clinic setting. Contact your pediatrician or health care professional regarding the use of this medicine in children. Special care may be needed. Overdosage: If you think you have taken too much of this medicine contact a poison control center or emergency room at once. NOTE: This medicine is only for you. Do not share this medicine with others. What if I miss a dose? It is important not to miss your dose. Call your doctor or health care professional if you are unable to keep an appointment. What may interact with this medicine? -bromocriptine -cyclosporine -medicines for diabetes, including insulin -medicines for heart disease or hypertension -quinidine This list may not describe all possible interactions. Give your health care provider a list of all the medicines, herbs, non-prescription drugs, or dietary supplements you use. Also tell them if you smoke, drink alcohol, or use illegal drugs. Some items may interact with your medicine. What should I watch for while using this medicine? Visit your doctor or  health care professional for regular checks on your progress. Your condition will be monitored carefully while you are receiving this medicine. This medicine may cause increases or decreases in blood sugar. Signs of high blood sugar include frequent urination, unusual thirst, flushed or dry skin, difficulty breathing, drowsiness, stomach ache, nausea, vomiting or dry mouth. Signs of low blood sugar include chills, cool, pale skin or cold sweats, drowsiness, extreme hunger, fast heartbeat, headache, nausea, nervousness or anxiety, shakiness, trembling, unsteadiness, tiredness, or weakness. Contact your doctor or health care professional right away if you experience any of these symptoms. What side effects may I notice from receiving this medicine? Side effects that you should report to your doctor or health care professional as soon as possible: -allergic reactions like skin rash, itching or hives, swelling of the face, lips, or tongue -changes in blood sugar -changes in heart rate -severe stomach pain Side effects that usually do not require medical attention (report to your doctor or health care professional if they continue or are bothersome): -diarrhea or constipation -gas or stomach pain -nausea, vomiting -pain, redness, swelling and irritation at site where injected This list may not describe all possible side effects. Call your doctor for medical advice about side effects. You may report side effects to FDA at 1-800-FDA-1088. Where should I keep my medicine? This drug is given in a hospital or clinic and will not be stored at home. NOTE: This sheet is a summary. It may not cover all possible information. If you have questions about this medicine, talk to your doctor, pharmacist, or health care provider.    2016, Elsevier/Gold Standard. (2013-03-03 17:43:04)  

## 2015-04-28 LAB — CHROMOGRANIN A: CHROMOGRAN A: 4 nmol/L (ref 0–5)

## 2015-04-28 NOTE — Progress Notes (Signed)
Hematology and Oncology Follow Up Visit  BRAYLYN RAPIER [id number] 1945/11/23 70 y.o. 04/28/2015   Principle Diagnosis:   Low-grade Neuroendocrine carcinoma with liver metastasis  Current Therapy:    Somatuline 120 mg subcutaneous every month     Interim History:  Mr. Jonathan Reed is back for follow-up. We initially saw him back in early December. He has had some issues that have prevented him from coming back to see [place]. Per appendectomy finding to have another PET scan done. This was done 2 days ago. The PET scan showed hepatomegaly. He had hypermetabolic liver lesions. The liver has slightly increased in size since late November area and his small size. He had no disease outside the liver.  His last chromogranin A level was 23.  He's been very reluctant to take Somatuline. He thinks this is similar to octreotide which she is convinced that he had a reaction to.  He has had intrahepatic therapy with Y-90. He had 2 treatments. Apparently this helped his his hepatomegaly and liver metastases.  He is not complaining of much pain. Pap he is on Coumadin. Not sure who manages this. I told him that the Somatuline will not interfere with the Coumadin. I gave his wife some information about the Somatuline.  His appetite is marginal. Over this will improve.  I did go ahead and give him a urine jug for a 24-hour urine collection.  Overall, his performance status is ECOG 1-2.   Medications:  Current outpatient prescriptions:  .  lisinopril (PRINIVIL,ZESTRIL) 20 MG tablet, Take 1 tablet (20 mg total) by mouth daily., Disp: 30 tablet, Rfl: 11 .  metoprolol succinate (TOPROL-XL) 50 MG 24 hr tablet, Take 1 tablet (50 mg total) by mouth daily. Take with or immediately following a meal. (Patient taking differently: Take 100 mg by mouth daily. Take with or immediately following a meal.), Disp: 30 tablet, Rfl: 2 .  warfarin (COUMADIN) 5 MG tablet, Take 1/2 tablet on Mondays/Thursdays; take 1 tablet all other  days. (Patient taking differently: Take 1/2 tablet on Tuesday and Thursday, and 1 tablet on all other days), Disp: 30 tablet, Rfl: 2 .  colchicine (COLCRYS) 0.6 MG tablet, Take 1 tablet (0.6 mg total) by mouth as needed (For acute gout flare up.). Take 2 tabs (1.2 mg) then 1 tab (0.6 mg) one hour later. (Patient not taking: Reported on 04/07/2015), Disp: 15 tablet, Rfl: 0 .  naproxen sodium (ANAPROX) 220 MG tablet, Take 220 mg by mouth 2 (two) times daily as needed (pain). Reported on 04/27/2015, Disp: , Rfl:   Allergies:  Allergies  Allergen Reactions  . Penicillins     Has patient had a PCN reaction causing immediate rash, facial/tongue/throat swelling, SOB or lightheadedness with hypotension: No Has patient had a PCN reaction causing severe rash involving mucus membranes or skin necrosis: No Has patient had a PCN reaction that required hospitalization No Has patient had a PCN reaction occurring within the last 10 years: No   CONSTIPATION If all of the above answers are "NO", then may proceed with Cephalosporin use.    Past Medical History, Surgical history, Social history, and Family History were reviewed and updated.  Review of Systems: As above  Physical Exam:  height is 5\' 11"  (1.803 m) and weight is 181 lb (82.101 kg). His oral temperature is 97.9 F (36.6 C). His blood pressure is 122/73 and his pulse is 70. His respiration is 16.   Wt Readings from Last 3 Encounters:  04/27/15 181 lb (82.101  kg)  04/07/15 177 lb 6.4 oz (80.468 kg)  02/20/15 180 lb (81.647 kg)     Thin African-American male. Head and neck exam shows no ocular or oral lesions. He has no palpable cervical or supraclavicular lymph nodes. Lungs are clear. Cardiac exam irregular rate and rhythm consistent with atrial fibrillation. He has 1/6 systolic ejection murmur. Abdomen is soft. He has good bowel sounds. Has marked hepatomegaly. His liver edge is probably 6 mL below the right costal margin. His liver extends  across the midline. There is no splenomegaly. Extremity shows no clubbing, cyanosis or edema. His good range motion of his joints. Skin exam shows no rashes. Neurological exam is nonfocal.  Lab Results  Component Value Date   WBC 5.0 04/27/2015   HGB 14.0 04/27/2015   HCT 39.6 04/27/2015   MCV 86 04/27/2015   PLT 122* 04/27/2015     Chemistry      Component Value Date/Time   NA 139 04/27/2015 0919   NA 142 03/30/2015 1100   K 4.3 04/27/2015 0919   K 4.6 03/30/2015 1100   CL 110 03/30/2015 1100   CL 105 08/24/2012 0849   CO2 21* 04/27/2015 0919   CO2 21* 03/30/2015 1100   BUN 13.6 04/27/2015 0919   BUN 16 03/30/2015 1100   CREATININE 0.9 04/27/2015 0919   CREATININE 1.18 03/30/2015 1100   CREATININE 1.15 02/14/2015 1406      Component Value Date/Time   CALCIUM 9.1 04/27/2015 0919   CALCIUM 9.3 03/30/2015 1100   ALKPHOS 464* 04/27/2015 0919   ALKPHOS 479* 03/30/2015 1100   AST 71* 04/27/2015 0919   AST 67* 03/30/2015 1100   ALT 40 04/27/2015 0919   ALT 45 03/30/2015 1100   BILITOT 3.84* 04/27/2015 0919   BILITOT 1.5* 03/30/2015 1100         Impression and Plan: Mr. Coulon is a 70 year old Afro-American male. He has a well-differentiated neuroendocrine tumor. He has liver metastasis.  I still think that seeing legend Somatuline would be helpful. I think it would be well tolerated. I tried to reassure him that I do not think he would have any side effects.  Hopefully, the FDA will approve the new radio-immunotherapy- Lutathera - we generally would be a very good option for him.   I will give Somatuline for 3 or 4 cycles and then reassess with our scans.  Assessment about 35 minutes with he and his family. It was nice to see him again. He has a very interesting job.  Quality-of-life is what is important. I want to make sure that he is still able to do his work. This is very important for him.   Volanda Napoleon, MD 2/10/20177:39 AM

## 2015-05-01 ENCOUNTER — Ambulatory Visit: Payer: Medicare Other

## 2015-05-06 LAB — 5 HIAA, QUANTITATIVE, URINE, 24 HOUR
5-HIAA, Urine: 10.3 mg/L
5-HIAA,Quant.,24 Hr Urine: 5.2 mg/24 hr (ref 0.0–14.9)

## 2015-05-15 ENCOUNTER — Ambulatory Visit: Payer: Medicare Other

## 2015-05-24 ENCOUNTER — Ambulatory Visit (INDEPENDENT_AMBULATORY_CARE_PROVIDER_SITE_OTHER): Payer: Medicare Other | Admitting: Pharmacist

## 2015-05-24 ENCOUNTER — Encounter: Payer: Self-pay | Admitting: Internal Medicine

## 2015-05-24 ENCOUNTER — Telehealth: Payer: Self-pay | Admitting: Radiology

## 2015-05-24 ENCOUNTER — Ambulatory Visit (INDEPENDENT_AMBULATORY_CARE_PROVIDER_SITE_OTHER): Payer: Medicare Other | Admitting: Internal Medicine

## 2015-05-24 VITALS — BP 126/88 | HR 64 | Temp 97.4°F | Ht 71.0 in | Wt 176.6 lb

## 2015-05-24 DIAGNOSIS — I4891 Unspecified atrial fibrillation: Secondary | ICD-10-CM

## 2015-05-24 DIAGNOSIS — I639 Cerebral infarction, unspecified: Secondary | ICD-10-CM

## 2015-05-24 DIAGNOSIS — I482 Chronic atrial fibrillation, unspecified: Secondary | ICD-10-CM

## 2015-05-24 DIAGNOSIS — R05 Cough: Secondary | ICD-10-CM

## 2015-05-24 DIAGNOSIS — R531 Weakness: Secondary | ICD-10-CM

## 2015-05-24 DIAGNOSIS — R195 Other fecal abnormalities: Secondary | ICD-10-CM

## 2015-05-24 DIAGNOSIS — Z8673 Personal history of transient ischemic attack (TIA), and cerebral infarction without residual deficits: Secondary | ICD-10-CM

## 2015-05-24 DIAGNOSIS — Z7901 Long term (current) use of anticoagulants: Secondary | ICD-10-CM

## 2015-05-24 DIAGNOSIS — R319 Hematuria, unspecified: Secondary | ICD-10-CM

## 2015-05-24 DIAGNOSIS — Z87440 Personal history of urinary (tract) infections: Secondary | ICD-10-CM | POA: Diagnosis not present

## 2015-05-24 DIAGNOSIS — R059 Cough, unspecified: Secondary | ICD-10-CM

## 2015-05-24 LAB — INFLUENZA PANEL BY PCR (TYPE A & B)
H1N1FLUPCR: NOT DETECTED
INFLAPCR: NEGATIVE
Influenza B By PCR: NEGATIVE

## 2015-05-24 LAB — POCT INR: INR: 3

## 2015-05-24 MED ORDER — GUAIFENESIN-DM 100-10 MG/5ML PO SYRP
5.0000 mL | ORAL_SOLUTION | ORAL | Status: DC | PRN
Start: 2015-05-24 — End: 2015-07-20

## 2015-05-24 NOTE — Progress Notes (Signed)
Anti-Coagulation Progress Note  Jonathan Reed is a 70 y.o. male who is currently on an anti-coagulation regimen.    RECENT RESULTS: Recent results are below, the most recent result is correlated with a dose of 25 mg. per week: Lab Results  Component Value Date   INR 3.0 05/24/2015   INR 4.40 04/24/2015   INR 2.70 04/07/2015   PROTIME 30.0* 05/15/2012    ANTI-COAG DOSE: Anticoagulation Dose Instructions as of 05/24/2015      [name] Tue Wed Thu Fri Sat   New Dose 5 mg 2.5 mg 2.5 mg 2.5 mg 2.5 mg 2.5 mg 5 mg       ANTICOAG SUMMARY: Anticoagulation Episode Summary    Current INR goal 2.0-3.0  Next INR check 06/12/2015  INR from last check 3.0 (05/24/2015)  Weekly max dose   Target end date Indefinite  [facility] check [facility]  Preferred lab   Send INR reminders to    Indications  Long term (current) use of anticoagulants [Z79.01] CVA (cerebral infarction) [I63.9] Chronic atrial fibrillation ([place]) [I48.2]        Comments         ANTICOAG TODAY: Anticoagulation Summary as of 05/24/2015    INR goal 2.0-3.0  Selected INR 3.0 (05/24/2015)  Next INR check 06/12/2015  Target end date Indefinite   Indications  Long term (current) use of anticoagulants [Z79.01] CVA (cerebral infarction) [I63.9] Chronic atrial fibrillation ([place]) [I48.2]      Anticoagulation Episode Summary    [facility] check [facility]   Preferred lab    Send INR reminders to    Comments       PATIENT INSTRUCTIONS: Patient Instructions  Patient instructed to take medications as defined in the Anti-coagulation Track section of this encounter.  Patient instructed to take today's dose.  Patient verbalized understanding of these instructions.       FOLLOW-UP Return in 3 weeks (on 06/12/2015) for Follow up INR at 0900h.  [name], III Pharm.D., CACP

## 2015-05-24 NOTE — Telephone Encounter (Signed)
Left message requesting patient call to schedule follow up app't w/ Dr Gardenia Phlegm, RN 05/24/2015 4:38 PM

## 2015-05-24 NOTE — Assessment & Plan Note (Addendum)
Pt denies hematuria today. Last UA- with large Hgb, numerous RBCs. Was treated for a UTI, Urine culture was negative. He is still taking warfain for atria fib.  Plan- repeat UA today. Abdominal imaging in the recent past was negative for Kidney mass or stones. He might require cystoscopy and hence referral to urology.  Addendum- 05/30/15- UA negative for blood/RBCs

## 2015-05-24 NOTE — Patient Instructions (Signed)
Patient instructed to take medications as defined in the Anti-coagulation Track section of this encounter.  Patient instructed to take today's dose.  Patient verbalized understanding of these instructions.    

## 2015-05-24 NOTE — Patient Instructions (Signed)
We will be giving you something for your cough. We will also be doing some blood work, we will call you if anything is abnormal.  Try to eat a balance diet.

## 2015-05-24 NOTE — Progress Notes (Signed)
INTERNAL MEDICINE TEACHING ATTENDING ADDENDUM - Lonia Roane M.D  Duration- indefinite, Indication- atrial fibrillation, INR- therapeutic. Agree with pharmacy recommendations as outlined in their note.    

## 2015-05-24 NOTE — Progress Notes (Signed)
Patient ID: Jonathan Reed, male   DOB: [birth year], 70 y.o.   MRN: [id number]   Subjective:   Patient ID: Jonathan Reed male   DOB: 10-16-1945 70 y.o.   MRN: [id number]  HPI: Jonathan Reed is a 70 y.o. with PMH listedbelow, presented today with complainst of coughing up white phlegm, that was getting better and then started getting worse again, for about 2 weeks now. Productive of whitish sputum, no rhinorrhea, no initial sorethroat. No fever, no myalgias, no SOB. No chest pain. No leg swelling.  Pt says he started feeling weak about 2 weeks ago. Has not been able to walk around, cant drive, he is usually very active. His appetite is poor. Occasional a loose stools, about 2 times a day, started in november after he had irradiation treatment, he has no vomiting. No abdominal pain. Started a new medication for his neuroendocrine tumor, with mets to liver- 04/28/15, and gets this med once a month.  Past Medical History  Diagnosis Date  . CHF (congestive heart failure) ([place])     with ICD (f/u with [facility])  . CVA (cerebral infarction) 2011    No residual Sx  . CAD (coronary artery disease) 2000  . HTN (hypertension)   . Nausea alone 01/06/2013  . Abdominal pain, unspecified site 01/06/2013  . Stroke ([place])   . Tooth ache 01/04/2014  . Carcinoid syndrome ([place])   . Metastatic malignant neuroendocrine tumor to liver ([place])   . Metastasis to liver ([place]) 10/06/2013  . Bronchitis 09/23/2014  . Gout    Review of Systems: CONSTITUTIONAL- No Fever, some weightloss SKIN- No Rash, colour changes or itching. HEAD- No Headache or dizziness. EYES- No Vision change  Mouth/throat- No Sorethroat, or bleeding gums. RESPIRATORY- No Cough or SOB. CARDIAC- No Palpitations, or chest pain. GI- No vomiting, diarrhoea, abd pain. URINARY- No Frequency, or dysuria.  Objective:  Physical Exam: Filed Vitals:   05/24/15 0857  BP: 126/88  Pulse: 64  Temp: 97.4 F (36.3 C)  TempSrc: Oral  Height: 5\' 11"  (1.803 m)    Weight: 176 lb 9.6 oz (80.105 kg)  SpO2: 100%   GENERAL- alert, co-operative, appears as stated age, not in any distress, appears disheveled HEENT- Atraumatic, normocephalic, PERRL, EOMI, oral mucosa appears moist, neck supple. CARDIAC- RRR, no murmurs, rubs or gallops.  RESP- Moving equal volumes of air, and clear to auscultation bilaterally, no wheezes or crackles. ABDOMEN- Soft, nontender, no guarding or rebound, palpable masses epigastric and RUQ, bowel sounds present. BACK- Normal curvature of the spine, No tenderness along the vertebrae, no CVA tenderness. NEURO- Alert and oriented, strenght upper and lower extremities- 5/5, Gait- Normal. EXTREMITIES- Warm, trace pedal edema, around the legs. PSYCH- Normal mood and affect, appropriate thought content and speech.  Assessment & Plan:  The patient's case and plan of care was discussed with attending physician, Dr. Lalla Brothers.  Please see problem based charting for assessment and plan.

## 2015-05-24 NOTE — Assessment & Plan Note (Signed)
Patient with weakness, generalized. He has been having 1-2 loose non bloody, non melanic stools most days, without vomiting, with Poor appetite. Likely this is related to his malignacy- neuroendocrine tumor with mets to brain or related to the new medication he was started on Somatuline, which he gets once a month or related to the radiation treatment he got in November. He technically cannot be described as having a diarrhea.  Plan- Will check Stool for C. Diff, as he has had a lot of contact with healthcare facilities, and is immunocompromised. - Encouraged PO intake - CBC - CMET - Check FLu, with persistent cough, fatigue and sorethroat hx  Flu- negative.

## 2015-05-25 ENCOUNTER — Other Ambulatory Visit (HOSPITAL_BASED_OUTPATIENT_CLINIC_OR_DEPARTMENT_OTHER): Payer: Medicare Other

## 2015-05-25 ENCOUNTER — Ambulatory Visit (HOSPITAL_BASED_OUTPATIENT_CLINIC_OR_DEPARTMENT_OTHER): Payer: Medicare Other

## 2015-05-25 ENCOUNTER — Ambulatory Visit (HOSPITAL_BASED_OUTPATIENT_CLINIC_OR_DEPARTMENT_OTHER): Payer: Medicare Other | Admitting: Hematology & Oncology

## 2015-05-25 ENCOUNTER — Encounter: Payer: Self-pay | Admitting: Hematology & Oncology

## 2015-05-25 VITALS — BP 104/79 | HR 67 | Temp 97.3°F | Resp 16 | Ht 71.0 in | Wt 177.0 lb

## 2015-05-25 DIAGNOSIS — D696 Thrombocytopenia, unspecified: Secondary | ICD-10-CM

## 2015-05-25 DIAGNOSIS — C7A8 Other malignant neuroendocrine tumors: Secondary | ICD-10-CM

## 2015-05-25 DIAGNOSIS — C7B8 Other secondary neuroendocrine tumors: Secondary | ICD-10-CM

## 2015-05-25 LAB — CBC WITH DIFFERENTIAL/PLATELET

## 2015-05-25 LAB — CBC WITH DIFFERENTIAL (CANCER CENTER ONLY)
BASO#: 0 10*3/uL (ref 0.0–0.2)
BASO%: 0.6 % (ref 0.0–2.0)
EOS%: 1.8 % (ref 0.0–7.0)
Eosinophils Absolute: 0.1 10*3/uL (ref 0.0–0.5)
HEMATOCRIT: 39.3 % (ref 38.7–49.9)
HEMOGLOBIN: 14 g/dL (ref 13.0–17.1)
LYMPH#: 0.8 10*3/uL — AB (ref 0.9–3.3)
LYMPH%: 22.9 % (ref 14.0–48.0)
MCH: 31 pg (ref 28.0–33.4)
MCHC: 35.6 g/dL (ref 32.0–35.9)
MCV: 87 fL (ref 82–98)
MONO#: 0.5 10*3/uL (ref 0.1–0.9)
MONO%: 13.8 % — ABNORMAL HIGH (ref 0.0–13.0)
NEUT%: 60.9 % (ref 40.0–80.0)
NEUTROS ABS: 2.1 10*3/uL (ref 1.5–6.5)
Platelets: 38 10*3/uL — ABNORMAL LOW (ref 145–400)
RBC: 4.52 10*6/uL (ref 4.20–5.70)
RDW: 19.5 % — ABNORMAL HIGH (ref 11.1–15.7)
WBC: 3.4 10*3/uL — ABNORMAL LOW (ref 4.0–10.0)

## 2015-05-25 LAB — CMP14 + ANION GAP
ALBUMIN: 2.9 g/dL — AB (ref 3.6–4.8)
ALT: 41 IU/L (ref 0–44)
ANION GAP: 14 mmol/L (ref 10.0–18.0)
AST: 73 IU/L — ABNORMAL HIGH (ref 0–40)
Albumin/Globulin Ratio: 0.9 — ABNORMAL LOW (ref 1.1–2.5)
Alkaline Phosphatase: 489 IU/L — ABNORMAL HIGH (ref 39–117)
BUN / CREAT RATIO: 13 (ref 10–22)
BUN: 15 mg/dL (ref 8–27)
Bilirubin Total: 3.7 mg/dL — ABNORMAL HIGH (ref 0.0–1.2)
CALCIUM: 8.7 mg/dL (ref 8.6–10.2)
CHLORIDE: 103 mmol/L (ref 96–106)
CO2: 23 mmol/L (ref 18–29)
CREATININE: 1.15 mg/dL (ref 0.76–1.27)
GFR, EST AFRICAN AMERICAN: 76 mL/min/{1.73_m2} (ref >59)
GFR, EST NON AFRICAN AMERICAN: 65 mL/min/{1.73_m2} (ref >59)
GLUCOSE: 109 mg/dL — AB (ref 65–99)
Globulin, Total: 3.1 g/dL (ref 1.5–4.5)
Potassium: 4.2 mmol/L (ref 3.5–5.2)
Sodium: 140 mmol/L (ref 134–144)
TOTAL PROTEIN: 6 g/dL (ref 6.0–8.5)

## 2015-05-25 LAB — URINALYSIS, ROUTINE W REFLEX MICROSCOPIC
BILIRUBIN UA: POSITIVE — AB
Glucose, UA: NEGATIVE
NITRITE UA: POSITIVE — AB
PH UA: 5.5 (ref 5.0–7.5)
RBC UA: NEGATIVE
Specific Gravity, UA: 1.027 (ref 1.005–1.030)
UUROB: 4 mg/dL — AB (ref 0.2–1.0)

## 2015-05-25 LAB — MICROSCOPIC EXAMINATION
CASTS: NONE SEEN /LPF
Epithelial Cells (non renal): NONE SEEN /hpf (ref 0–10)

## 2015-05-25 LAB — LACTATE DEHYDROGENASE: LDH: 255 U/L — ABNORMAL HIGH (ref 125–245)

## 2015-05-25 MED ORDER — LANREOTIDE ACETATE 120 MG/0.5ML ~~LOC~~ SOLN
120.0000 mg | Freq: Once | SUBCUTANEOUS | Status: AC
  Administered 2015-05-25: 120 mg via SUBCUTANEOUS
  Filled 2015-05-25: qty 120

## 2015-05-25 NOTE — Patient Instructions (Signed)
Lanreotide injection What is this medicine? LANREOTIDE (lan REE oh tide) is used to reduce blood levels of growth hormone in patients with a condition called acromegaly. It also works to slow or stop tumor growth in patients with gastroenteropancreatic neuroendocrine tumor (GEP-NET). This medicine may be used for other purposes; ask your health care provider or pharmacist if you have questions. What should I tell my health care provider before I take this medicine? They need to know if you have any of these conditions: -diabetes -gallbladder disease -heart disease -kidney disease -liver disease -an unusual or allergic reaction to lanreotide, other medicines, latex, foods, dyes, or preservatives -pregnant or trying to get pregnant -breast-feeding How should I use this medicine? This medicine is for injection under the skin. It is given by a health care professional in a hospital or clinic setting. Contact your pediatrician or health care professional regarding the use of this medicine in children. Special care may be needed. Overdosage: If you think you have taken too much of this medicine contact a poison control center or emergency room at once. NOTE: This medicine is only for you. Do not share this medicine with others. What if I miss a dose? It is important not to miss your dose. Call your doctor or health care professional if you are unable to keep an appointment. What may interact with this medicine? -bromocriptine -cyclosporine -medicines for diabetes, including insulin -medicines for heart disease or hypertension -quinidine This list may not describe all possible interactions. Give your health care provider a list of all the medicines, herbs, non-prescription drugs, or dietary supplements you use. Also tell them if you smoke, drink alcohol, or use illegal drugs. Some items may interact with your medicine. What should I watch for while using this medicine? Visit your doctor or  health care professional for regular checks on your progress. Your condition will be monitored carefully while you are receiving this medicine. This medicine may cause increases or decreases in blood sugar. Signs of high blood sugar include frequent urination, unusual thirst, flushed or dry skin, difficulty breathing, drowsiness, stomach ache, nausea, vomiting or dry mouth. Signs of low blood sugar include chills, cool, pale skin or cold sweats, drowsiness, extreme hunger, fast heartbeat, headache, nausea, nervousness or anxiety, shakiness, trembling, unsteadiness, tiredness, or weakness. Contact your doctor or health care professional right away if you experience any of these symptoms. What side effects may I notice from receiving this medicine? Side effects that you should report to your doctor or health care professional as soon as possible: -allergic reactions like skin rash, itching or hives, swelling of the face, lips, or tongue -changes in blood sugar -changes in heart rate -severe stomach pain Side effects that usually do not require medical attention (report to your doctor or health care professional if they continue or are bothersome): -diarrhea or constipation -gas or stomach pain -nausea, vomiting -pain, redness, swelling and irritation at site where injected This list may not describe all possible side effects. Call your doctor for medical advice about side effects. You may report side effects to FDA at 1-800-FDA-1088. Where should I keep my medicine? This drug is given in a hospital or clinic and will not be stored at home. NOTE: This sheet is a summary. It may not cover all possible information. If you have questions about this medicine, talk to your doctor, pharmacist, or health care provider.    2016, Elsevier/Gold Standard. (2013-03-03 17:43:04)  

## 2015-05-25 NOTE — Progress Notes (Signed)
Hematology and Oncology Follow Up Visit  Jonathan Reed [id number] 1946-01-29 70 y.o. 05/25/2015   Principle Diagnosis:   Low-grade Neuroendocrine carcinoma with liver metastasis  Current Therapy:    Somatuline 120 mg subcutaneous every month     Interim History:  Jonathan Reed is back for follow-up. He does not feel all that well. He says he really is not that hungry. He just does not have much of an appetite.  He's had no problems with bleeding. He is on Coumadin.  He got Somatuline back in February. He seemed to tolerate this pretty well.  His last chromogranin A value was 4 back in early February. We did do a 24-hour urine on him. This showed a 5-HIAA level of only 10.3 mg.  He's not really working much. He just does not have a lot of energy.  He's had no leg swelling. On occasion, he says that his right leg may get a little bit swollen.  He's had no fever. He's had no cough or shortness of breath. He's had no nausea or vomiting.  Overall, I see that his performance status is ECOG 1.   Medications:  Current outpatient prescriptions:  .  colchicine (COLCRYS) 0.6 MG tablet, Take 1 tablet (0.6 mg total) by mouth as needed (For acute gout flare up.). Take 2 tabs (1.2 mg) then 1 tab (0.6 mg) one hour later., Disp: 15 tablet, Rfl: 0 .  guaiFENesin-dextromethorphan (ROBITUSSIN DM) 100-10 MG/5ML syrup, Take 5 mLs by mouth every 4 (four) hours as needed for cough., Disp: 118 mL, Rfl: 0 .  lisinopril (PRINIVIL,ZESTRIL) 20 MG tablet, Take 1 tablet (20 mg total) by mouth daily., Disp: 30 tablet, Rfl: 11 .  metoprolol succinate (TOPROL-XL) 50 MG 24 hr tablet, Take 1 tablet (50 mg total) by mouth daily. Take with or immediately following a meal. (Patient taking differently: Take 100 mg by mouth daily. Take with or immediately following a meal.), Disp: 30 tablet, Rfl: 2 .  naproxen sodium (ANAPROX) 220 MG tablet, Take 220 mg by mouth 2 (two) times daily as needed (pain). Reported on 04/27/2015,  Disp: , Rfl:  .  warfarin (COUMADIN) 5 MG tablet, Take 1/2 tablet on Mondays/Thursdays; take 1 tablet all other days. (Patient taking differently: Take 1/2 tablet on Tuesday and Thursday, and 1 tablet on all other days), Disp: 30 tablet, Rfl: 2  Allergies:  No Active Allergies  Past Medical History, Surgical history, Social history, and Family History were reviewed and updated.  Review of Systems: As above  Physical Exam:  height is 5\' 11"  (1.803 m) and weight is 177 lb (80.287 kg). His oral temperature is 97.3 F (36.3 C). His blood pressure is 104/79 and his pulse is 67. His respiration is 16.   Wt Readings from Last 3 Encounters:  05/25/15 177 lb (80.287 kg)  05/24/15 176 lb 9.6 oz (80.105 kg)  04/27/15 181 lb (82.101 kg)     Thin African-American male. Head and neck exam shows no ocular or oral lesions. He has no palpable cervical or supraclavicular lymph nodes. His pacemaker is in the upper part of the left anterior chest wall. Lungs are clear. Cardiac exam irregular rate and rhythm consistent with atrial fibrillation. He has 1/6 systolic ejection murmur. Abdomen is soft. He has good bowel sounds. Has marked hepatomegaly. His liver edge is probably 6 mL below the right costal margin. His liver extends across the midline. His spleen tip is about 3-4 cm below the left costal margin.Marland Kitchen Extremity shows  no clubbing, cyanosis or edema. There may be a little bit of nonpitting edema of the right lower leg. No venous cord is noted in the legs. He has a negative Homans sign. His good range motion of his joints. Skin exam shows no rashes. Neurological exam is nonfocal.  Lab Results  Component Value Date   WBC 3.4* 05/25/2015   HGB 14.0 05/25/2015   HCT 39.3 05/25/2015   MCV 87 05/25/2015   PLT 38 Platelet count confirmed by slide estimate* 05/25/2015     Chemistry      Component Value Date/Time   NA 140 05/24/2015 1011   NA 139 04/27/2015 0919   NA 142 03/30/2015 1100   K 4.2  05/24/2015 1011   K 4.3 04/27/2015 0919   CL 103 05/24/2015 1011   CL 105 08/24/2012 0849   CO2 23 05/24/2015 1011   CO2 21* 04/27/2015 0919   BUN 15 05/24/2015 1011   BUN 13.6 04/27/2015 0919   BUN 16 03/30/2015 1100   CREATININE 1.15 05/24/2015 1011   CREATININE 0.9 04/27/2015 0919   CREATININE 1.15 02/14/2015 1406      Component Value Date/Time   CALCIUM 8.7 05/24/2015 1011   CALCIUM 9.1 04/27/2015 0919   ALKPHOS 489* 05/24/2015 1011   ALKPHOS 464* 04/27/2015 0919   AST 73* 05/24/2015 1011   AST 71* 04/27/2015 0919   ALT 41 05/24/2015 1011   ALT 40 04/27/2015 0919   BILITOT 3.7* 05/24/2015 1011   BILITOT 3.84* 04/27/2015 0919   BILITOT 1.5* 03/30/2015 1100         Impression and Plan: Jonathan Reed is a 70 year old Afro-American male. He has a well-differentiated neuroendocrine tumor. He has liver metastasis.  I am not sure as to what is going on. His platelets is clearly less. I was at his blood smear. I do not see anything on the blood smear that looked suspicious area did  His splenomegaly is much more prominent. I cannot feel this last time.  It seems as if he is also developing cirrhosis. I would not think that this would be from his underlying malignancy. Again, the chromogranin A was normal. I suppose that some of these tumors may transforming into a more aggressive neuroendocrine tumor.  I think we really need to get a CT scan of his abdomen and pelvis. I really need to see what is going on with his spleen and to some degree with his liver.  He did have the intrahepatic therapy back in September. I would not think that we would be see in complications's from this at this point.   I still think we should do the Somatuline.  I spent about 45 minutes with he and his family. As always, they're very nice. I just am not sure as to what is going on with him right now and I think we really have to figure this out.  I want to see him back in 2 weeks.  He is on Coumadin.  I think it is okay for him to still be on Coumadin despite the thrombocytopenia.  Volanda Napoleon, MD 3/9/20175:02 PM

## 2015-05-26 NOTE — Progress Notes (Signed)
Internal Medicine Clinic Attending  Case discussed with Dr. Emokpae at the time of the visit.  We reviewed the resident's history and exam and pertinent patient test results.  I agree with the assessment, diagnosis, and plan of care documented in the resident's note.  

## 2015-05-29 LAB — CHROMOGRANIN A: CHROMOGRAN A: 2 nmol/L (ref 0–5)

## 2015-05-30 ENCOUNTER — Telehealth: Payer: Self-pay | Admitting: Radiology

## 2015-05-30 NOTE — Telephone Encounter (Signed)
Left message on Jonathan Reed's M# voice mail requesting that she call to schedule a follow up app't for Jonathan Reed w/ Dr Annamaria Boots.    Haron Beilke Riki Rusk, [place] 05/30/2015 8:33 AM

## 2015-05-31 ENCOUNTER — Encounter (HOSPITAL_COMMUNITY): Payer: Self-pay

## 2015-05-31 ENCOUNTER — Ambulatory Visit (HOSPITAL_COMMUNITY)
Admission: RE | Admit: 2015-05-31 | Discharge: 2015-05-31 | Disposition: A | Discharge status: Home / Self Care | Payer: Medicare Other | Source: Ambulatory Visit | Attending: Hematology & Oncology | Admitting: Hematology & Oncology

## 2015-05-31 DIAGNOSIS — C787 Secondary malignant neoplasm of liver and intrahepatic bile duct: Secondary | ICD-10-CM | POA: Insufficient documentation

## 2015-05-31 DIAGNOSIS — R188 Other ascites: Secondary | ICD-10-CM | POA: Insufficient documentation

## 2015-05-31 DIAGNOSIS — D696 Thrombocytopenia, unspecified: Secondary | ICD-10-CM

## 2015-05-31 DIAGNOSIS — R160 Hepatomegaly, not elsewhere classified: Secondary | ICD-10-CM | POA: Diagnosis not present

## 2015-05-31 DIAGNOSIS — C7B8 Other secondary neuroendocrine tumors: Secondary | ICD-10-CM | POA: Insufficient documentation

## 2015-05-31 DIAGNOSIS — I2510 Atherosclerotic heart disease of native coronary artery without angina pectoris: Secondary | ICD-10-CM | POA: Diagnosis not present

## 2015-05-31 MED ORDER — IOHEXOL 300 MG/ML  SOLN
100.0000 mL | Freq: Once | INTRAMUSCULAR | Status: AC | PRN
  Administered 2015-05-31: 100 mL via INTRAVENOUS

## 2015-06-06 ENCOUNTER — Telehealth: Payer: Self-pay | Admitting: Radiology

## 2015-06-06 ENCOUNTER — Telehealth: Payer: Self-pay | Admitting: Internal Medicine

## 2015-06-06 NOTE — Telephone Encounter (Signed)
Jonathan Reed (patient's daughter) states that she will check with her dad before scheduling his follow up appointment.  She is uncertain as to his willingness to schedule a follow up with Dr. Annamaria Boots. His next follow up appointment with Dr. Marin Olp is on 06/08/2015.    Braylinn Gulden Riki Rusk, RN 06/06/2015 9:34 AM

## 2015-06-06 NOTE — Telephone Encounter (Signed)
APPT. REMINDER CALL, LMTCB °

## 2015-06-07 ENCOUNTER — Ambulatory Visit: Payer: Medicare Other | Admitting: Internal Medicine

## 2015-06-08 ENCOUNTER — Other Ambulatory Visit (HOSPITAL_BASED_OUTPATIENT_CLINIC_OR_DEPARTMENT_OTHER): Payer: Medicare Other

## 2015-06-08 ENCOUNTER — Encounter: Payer: Self-pay | Admitting: Hematology & Oncology

## 2015-06-08 ENCOUNTER — Telehealth: Payer: Self-pay | Admitting: *Deleted

## 2015-06-08 ENCOUNTER — Ambulatory Visit (HOSPITAL_BASED_OUTPATIENT_CLINIC_OR_DEPARTMENT_OTHER): Payer: Medicare Other | Admitting: Hematology & Oncology

## 2015-06-08 VITALS — BP 127/96 | HR 86 | Temp 97.6°F | Resp 16 | Ht 71.0 in | Wt 179.0 lb

## 2015-06-08 DIAGNOSIS — Z9889 Other specified postprocedural states: Secondary | ICD-10-CM | POA: Insufficient documentation

## 2015-06-08 DIAGNOSIS — I429 Cardiomyopathy, unspecified: Secondary | ICD-10-CM | POA: Insufficient documentation

## 2015-06-08 DIAGNOSIS — R160 Hepatomegaly, not elsewhere classified: Secondary | ICD-10-CM

## 2015-06-08 DIAGNOSIS — C7A8 Other malignant neuroendocrine tumors: Secondary | ICD-10-CM | POA: Diagnosis present

## 2015-06-08 DIAGNOSIS — C7B8 Other secondary neuroendocrine tumors: Secondary | ICD-10-CM

## 2015-06-08 DIAGNOSIS — I4891 Unspecified atrial fibrillation: Secondary | ICD-10-CM | POA: Insufficient documentation

## 2015-06-08 DIAGNOSIS — D696 Thrombocytopenia, unspecified: Secondary | ICD-10-CM

## 2015-06-08 DIAGNOSIS — R188 Other ascites: Secondary | ICD-10-CM

## 2015-06-08 DIAGNOSIS — R64 Cachexia: Secondary | ICD-10-CM

## 2015-06-08 DIAGNOSIS — I639 Cerebral infarction, unspecified: Secondary | ICD-10-CM | POA: Insufficient documentation

## 2015-06-08 LAB — CMP (CANCER CENTER ONLY)
ALK PHOS: 438 U/L — AB (ref 26–84)
ALT: 41 U/L (ref 10–47)
AST: 70 U/L — AB (ref 11–38)
Albumin: 2.4 g/dL — ABNORMAL LOW (ref 3.3–5.5)
BILIRUBIN TOTAL: 4.9 mg/dL — AB (ref 0.20–1.60)
BUN: 13 mg/dL (ref 7–22)
CO2: 27 mEq/L (ref 18–33)
Calcium: 8.5 mg/dL (ref 8.0–10.3)
Chloride: 104 mEq/L (ref 98–108)
Creat: 1.2 mg/dl (ref 0.6–1.2)
GLUCOSE: 102 mg/dL (ref 73–118)
Potassium: 4.1 mEq/L (ref 3.3–4.7)
Sodium: 142 mEq/L (ref 128–145)
Total Protein: 6 g/dL — ABNORMAL LOW (ref 6.4–8.1)

## 2015-06-08 LAB — CBC WITH DIFFERENTIAL (CANCER CENTER ONLY)
BASO#: 0 10*3/uL (ref 0.0–0.2)
BASO%: 1.1 % (ref 0.0–2.0)
EOS ABS: 0.1 10*3/uL (ref 0.0–0.5)
EOS%: 2.1 % (ref 0.0–7.0)
HCT: 41 % (ref 38.7–49.9)
HEMOGLOBIN: 14.8 g/dL (ref 13.0–17.1)
LYMPH#: 0.8 10*3/uL — AB (ref 0.9–3.3)
LYMPH%: 28 % (ref 14.0–48.0)
MCH: 31.1 pg (ref 28.0–33.4)
MCHC: 36.1 g/dL — ABNORMAL HIGH (ref 32.0–35.9)
MCV: 86 fL (ref 82–98)
MONO#: 0.3 10*3/uL (ref 0.1–0.9)
MONO%: 11.7 % (ref 0.0–13.0)
NEUT%: 57.1 % (ref 40.0–80.0)
NEUTROS ABS: 1.6 10*3/uL (ref 1.5–6.5)
Platelets: 65 10*3/uL — ABNORMAL LOW (ref 145–400)
RBC: 4.76 10*6/uL (ref 4.20–5.70)
RDW: 20.9 % — ABNORMAL HIGH (ref 11.1–15.7)
WBC: 2.8 10*3/uL — AB (ref 4.0–10.0)

## 2015-06-08 LAB — CHCC SATELLITE - SMEAR

## 2015-06-08 LAB — LACTATE DEHYDROGENASE: LDH: 279 U/L — AB (ref 125–245)

## 2015-06-08 MED ORDER — MEGESTROL ACETATE 40 MG/ML PO SUSP: 400.0000 mg | Freq: Two times a day (BID) | ORAL | Status: DC

## 2015-06-08 NOTE — Progress Notes (Signed)
Hematology and Oncology Follow Up Visit  Jonathan Reed [id number] May 06, 1945 70 y.o. 06/08/2015   Principle Diagnosis:   Low-grade Neuroendocrine carcinoma with liver metastasis  Current Therapy:    Somatuline 120 mg subcutaneous every month     Interim History:  Jonathan Reed is back for follow-up. He still has some weakness in his legs. I had believed that this is probably from his liver.  We did go ahead and get a CT scan of his abdomen and pelvis. This was done on March 15. This showed marked hepatomegaly. This is different than what was just 4 months ago. He has a widespread metastatic disease. Some of the tumors appear to be larger. Some might be stable. He has a small amount of ascites. There is no splenomegaly.  I have to believe that there is something that is changing with his disease. I would not think that a low-grade neuroendocrine cancer would be causing these problems. As such, I think that were going to have to get a biopsy of one of these lesions to see if there is a change in aggressiveness.  He's had no problems with bleeding. His weight is stable. He does not have a lot of appetite. I think his hepatomegaly is probably pressing on his liver.  He's had no bruising. He is on Coumadin. He does have a pacemaker in place.  He's had no fever.  He's had some slight swelling in his ankles, particularly the right ankle.  He's had no pruritus. He's had no nausea or vomiting.  Overall, I see that his performance status is ECOG 1.   Medications:  Current outpatient prescriptions:  .  colchicine (COLCRYS) 0.6 MG tablet, Take 1 tablet (0.6 mg total) by mouth as needed (For acute gout flare up.). Take 2 tabs (1.2 mg) then 1 tab (0.6 mg) one hour later., Disp: 15 tablet, Rfl: 0 .  guaiFENesin-dextromethorphan (ROBITUSSIN DM) 100-10 MG/5ML syrup, Take 5 mLs by mouth every 4 (four) hours as needed for cough., Disp: 118 mL, Rfl: 0 .  lisinopril (PRINIVIL,ZESTRIL) 20 MG tablet, Take  1 tablet (20 mg total) by mouth daily., Disp: 30 tablet, Rfl: 11 .  megestrol (MEGACE) 40 MG/ML suspension, Take 10 mLs (400 mg total) by mouth 2 (two) times daily., Disp: 480 mL, Rfl: 3 .  metoprolol succinate (TOPROL-XL) 50 MG 24 hr tablet, Take 1 tablet (50 mg total) by mouth daily. Take with or immediately following a meal. (Patient taking differently: Take 100 mg by mouth daily. Take with or immediately following a meal.), Disp: 30 tablet, Rfl: 2 .  naproxen sodium (ANAPROX) 220 MG tablet, Take 220 mg by mouth 2 (two) times daily as needed (pain). Reported on 04/27/2015, Disp: , Rfl:  .  warfarin (COUMADIN) 5 MG tablet, Take 1/2 tablet on Mondays/Thursdays; take 1 tablet all other days. (Patient taking differently: Take 1/2 tablet on Tuesday and Thursday, and 1 tablet on all other days), Disp: 30 tablet, Rfl: 2  Allergies:  No Active Allergies  Past Medical History, Surgical history, Social history, and Family History were reviewed and updated.  Review of Systems: As above  Physical Exam:  height is '5\' 11"'  (1.803 m) and weight is 179 lb (81.194 kg). His oral temperature is 97.6 F (36.4 C). His blood pressure is 127/96 and his pulse is 86. His respiration is 16.   Wt Readings from Last 3 Encounters:  06/08/15 179 lb (81.194 kg)  05/25/15 177 lb (80.287 kg)  05/24/15 176 lb 9.6  oz (80.105 kg)     Thin African-American male. Head and neck exam shows no ocular or oral lesions. He has no palpable cervical or supraclavicular lymph nodes. His pacemaker is in the upper part of the left anterior chest wall. Lungs are clear. Cardiac exam irregular rate and rhythm consistent with atrial fibrillation. He has 1/6 systolic ejection murmur. Abdomen is soft. He has good bowel sounds. Has marked hepatomegaly. His liver edge is probably 6 mL below the right costal margin. His liver extends across the midline.  below the left costal margin.Marland Kitchen Extremity shows no clubbing, cyanosis or edema. There may be a  little bit of nonpitting edema of the right lower leg. No venous cord is noted in the legs. He has a negative Homans sign. His good range motion of his joints. Skin exam shows no rashes. Neurological exam is nonfocal.  Lab Results  Component Value Date   WBC 2.8* 06/08/2015   HGB 14.8 06/08/2015   HCT 41.0 06/08/2015   MCV 86 06/08/2015   PLT 65 Platelet count consistent in citrate* 06/08/2015     Chemistry      Component Value Date/Time   NA 142 06/08/2015 1156   NA 140 05/24/2015 1011   NA 139 04/27/2015 0919   NA 142 03/30/2015 1100   K 4.1 06/08/2015 1156   K 4.2 05/24/2015 1011   K 4.3 04/27/2015 0919   CL 104 06/08/2015 1156   CL 103 05/24/2015 1011   CL 105 08/24/2012 0849   CO2 27 06/08/2015 1156   CO2 23 05/24/2015 1011   CO2 21* 04/27/2015 0919   BUN 13 06/08/2015 1156   BUN 15 05/24/2015 1011   BUN 13.6 04/27/2015 0919   BUN 16 03/30/2015 1100   CREATININE 1.2 06/08/2015 1156   CREATININE 1.15 05/24/2015 1011   CREATININE 0.9 04/27/2015 0919      Component Value Date/Time   CALCIUM 8.5 06/08/2015 1156   CALCIUM 8.7 05/24/2015 1011   CALCIUM 9.1 04/27/2015 0919   ALKPHOS 438* 06/08/2015 1156   ALKPHOS 489* 05/24/2015 1011   ALKPHOS 464* 04/27/2015 0919   AST 70* 06/08/2015 1156   AST 73* 05/24/2015 1011   AST 71* 04/27/2015 0919   ALT 41 06/08/2015 1156   ALT 41 05/24/2015 1011   ALT 40 04/27/2015 0919   BILITOT 4.90* 06/08/2015 1156   BILITOT 3.7* 05/24/2015 1011   BILITOT 3.84* 04/27/2015 0919   BILITOT 1.5* 03/30/2015 1100         Impression and Plan: Jonathan Reed is a 70 year old Afro-American male. He has a well-differentiated neuroendocrine tumor. He has liver metastasis.  I am grateful has played a count is a little bit better. I still have to believe that all of this is stemming from his hepatomegaly.  This is a very "tricky" situation. I think a biopsy really will help [place] out. However, he is on Coumadin. He has had the  thrombocytopenia.  I spoke to his pharmacist, Jonathan Reed, who is one anticoagulant pharmacist. He will seen if we have to get Jonathan Reed on heparin and off Coumadin.   As far as therapy, I think we are going to have to change therapy. I think the choice of therapy will be dictated by the biopsy results.  If he still has a low-grade type of tumor, then we might be overdue getaway with Afinitor.  If he has a high-grade tumor, though he might have to treat this like small cell lung cancer, and  use carboplatinum-based chemotherapy. I would really hate to do that.   I don't think he needs a bone marrow biopsy given that his platelet count is better.   I spent about an hour with he and his family. They're all very nice. There are all very involved. This is very complicated. It has taken [place] while to try to sort things out.   His liver function tests show that his bilirubin is up a little bit. Again this might be from his hepatic metastasis. He is not hypercalcemic. He is not hypo-glycemic.  I want to try to get the biopsy next week. I will then see him back the following week and we will, with a "game plan" as far as treatment goes. and I think we really have to figure this out.   Volanda Napoleon, MD 3/23/20175:41 PM

## 2015-06-08 NOTE — Telephone Encounter (Signed)
Critical Value Total Bilirubin 4.90 Dr Marin Olp notified. No orders at this time

## 2015-06-09 ENCOUNTER — Other Ambulatory Visit: Payer: Self-pay | Admitting: Family

## 2015-06-09 DIAGNOSIS — C7B8 Other secondary neuroendocrine tumors: Secondary | ICD-10-CM

## 2015-06-12 ENCOUNTER — Ambulatory Visit (INDEPENDENT_AMBULATORY_CARE_PROVIDER_SITE_OTHER): Payer: Medicare Other | Admitting: Pharmacist

## 2015-06-12 DIAGNOSIS — I639 Cerebral infarction, unspecified: Secondary | ICD-10-CM

## 2015-06-12 DIAGNOSIS — I482 Chronic atrial fibrillation, unspecified: Secondary | ICD-10-CM

## 2015-06-12 DIAGNOSIS — Z7901 Long term (current) use of anticoagulants: Secondary | ICD-10-CM

## 2015-06-12 DIAGNOSIS — Z8673 Personal history of transient ischemic attack (TIA), and cerebral infarction without residual deficits: Secondary | ICD-10-CM | POA: Diagnosis not present

## 2015-06-12 LAB — POCT INR: INR: 1.8

## 2015-06-12 LAB — CHROMOGRANIN A: Chromogranin A: 2 nmol/L (ref 0–5)

## 2015-06-12 NOTE — Patient Instructions (Signed)
Patient instructed to take medications as defined in the Anti-coagulation Track section of this encounter.  Patient instructed to OMIT doses of warfarin until instructed otherwise. Continue to OMIT all warfarin doses. Continue Lovenox 80mg  administered subcutaneously as was demonstrated today--level of the belly button--2 inches away from the belly button, alternating sites. You will call Pharmacist Paulla Dolly at [phone] to indicate WHEN the procedure is PLANNED and I will then indicate to you when your LAST dose of Lovenox will be administered.  Patient verbalized understanding of these instructions.

## 2015-06-12 NOTE — Progress Notes (Signed)
INTERNAL MEDICINE TEACHING ATTENDING ADDENDUM - Aldine Contes M.D  Duration- indefinite, Indication- afib, CVA, INR- sub therapeutic. Agree with pharmacy recommendations as outlined in their note. Of note, coumadin is on hold for possible liver biopsy. Will resume after biopsy

## 2015-06-12 NOTE — Progress Notes (Signed)
Anti-Coagulation Progress Note  Jonathan Reed is a 70 y.o. male who is currently on an anti-coagulation regimen.    RECENT RESULTS: Recent results are below, the most recent result is correlated with a dose of 12.5 mg. per week--since he commenced his "hold" of warfarin. I was contacted on 23-MAR-17 by Dr. Marin Olp who indicated the patient would need a liver biopsy probably to be scheduled this week. I discussed case with Dr. Kayren Eaves Physician in [facility]. We calculated a CHADS2 score that indicated 3-4 putting patient at high risk for stroke OFF of warfarin. We are sensitive too to the issues with active oncologic disease he is at increased risk for VTE. We discussed the patient's bleeding risk relative to the procedure as well as a recent history of thrombocytopenia--though PLTC has improved as noted by Dr. Marin Olp. Discussion(s) with the family have focused upon all of these issues. With advice and consent of the patient/family we will OMIT warfarin (underway) and commence Lovenox 80mg  SQ q12h (commenced this morning in [facility])--subsequent doses to be administered by family. Syringes provided. We will STOP the Lovenox 24 prior to planned liver biopsy. Patient/daughter(s) will call me to apprise of day/date/time of procedure and I will then indicate to them LAST SCHEDULED dose of Lovenox.  Lab Results  Component Value Date   INR 1.80 06/12/2015   INR 3.0 05/24/2015   INR 4.40 04/24/2015   PROTIME 30.0* 05/15/2012    ANTI-COAG DOSE: Anticoagulation Dose Instructions as of 06/12/2015      [name] Tue Wed Thu Fri Sat   New Dose Hold Hold Hold Hold Hold Hold Hold    Description        Continue to OMIT all warfarin doses. Continue Lovenox 80mg  administered subcutaneously as was demonstrated today--level of the belly button--2 inches away from the belly button, alternating sites. You will call Pharmacist Paulla Dolly at [phone] to indicate WHEN the procedure is PLANNED and I will then  indicate to you when your LAST dose of Lovenox will be administered.        ANTICOAG SUMMARY: Anticoagulation Episode Summary    Current INR goal 2.0-3.0  Next INR check 06/19/2015  INR from last check 1.80! (06/12/2015)  Weekly max dose   Target end date Indefinite  [facility]  Preferred lab   Send INR reminders to    Indications  Long term (current) use of anticoagulants [Z79.01] CVA (cerebral infarction) [I63.9] Chronic atrial fibrillation ([place]) [I48.2]        Comments         ANTICOAG TODAY: Anticoagulation Summary as of 06/12/2015    INR goal 2.0-3.0  Selected INR 1.80! (06/12/2015)  Next INR check 06/19/2015  Target end date Indefinite   Indications  Long term (current) use of anticoagulants [Z79.01] CVA (cerebral infarction) [I63.9] Chronic atrial fibrillation ([place]) [I48.2]      Anticoagulation Episode Summary    [facility] check [facility]   Preferred lab    Send INR reminders to    Comments       PATIENT INSTRUCTIONS: Patient Instructions  Patient instructed to take medications as defined in the Anti-coagulation Track section of this encounter.  Patient instructed to OMIT doses of warfarin until instructed otherwise. Continue to OMIT all warfarin doses. Continue Lovenox 80mg  administered subcutaneously as was demonstrated today--level of the belly button--2 inches away from the belly button, alternating sites. You will call Pharmacist Paulla Dolly at [phone] to indicate WHEN the procedure is  PLANNED and I will then indicate to you when your LAST dose of Lovenox will be administered.  Patient verbalized understanding of these instructions.       FOLLOW-UP Return in 7 days (on 06/19/2015) for Follow up visit after procedure at 0915h.  [name], III Pharm.D., CACP

## 2015-06-13 ENCOUNTER — Ambulatory Visit (INDEPENDENT_AMBULATORY_CARE_PROVIDER_SITE_OTHER): Payer: Medicare Other | Admitting: Pharmacist

## 2015-06-13 ENCOUNTER — Other Ambulatory Visit: Payer: Self-pay | Admitting: General Surgery

## 2015-06-13 DIAGNOSIS — Z8673 Personal history of transient ischemic attack (TIA), and cerebral infarction without residual deficits: Secondary | ICD-10-CM

## 2015-06-13 DIAGNOSIS — Z7901 Long term (current) use of anticoagulants: Secondary | ICD-10-CM | POA: Diagnosis not present

## 2015-06-13 DIAGNOSIS — I482 Chronic atrial fibrillation, unspecified: Secondary | ICD-10-CM

## 2015-06-13 DIAGNOSIS — I639 Cerebral infarction, unspecified: Secondary | ICD-10-CM

## 2015-06-13 LAB — POCT INR: INR: 1.7

## 2015-06-13 NOTE — Progress Notes (Signed)
Anti-Coagulation Progress Note  Jonathan Reed is a 70 y.o. male who is currently on an anti-coagulation regimen.    RECENT RESULTS: Recent results are below, the most recent result is correlated with a dose of ZERO mg warfarin since Friday 24-MAR-17.Received TWO Lovenox bridge doses yesterday (Monday 27-MAR-17 at 0900 and 2100h); received this morning's 0900 dose of Lovenox. Lovenox NOW ON HOLD. NO MORE DOSES between now and planned biopsy tomorrow Wednesday 29-MAR-17 at 1045h. This will represent 24++h OFF Lovenox in advance of bx.  Lab Results  Component Value Date   INR 1.70 06/13/2015   INR 1.80 06/12/2015   INR 3.0 05/24/2015   PROTIME 30.0* 05/15/2012    ANTI-COAG DOSE: Anticoagulation Dose Instructions as of 06/13/2015      [name] Tue Wed Thu Fri Sat   New Dose Hold Hold Hold Hold Hold Hold Hold    Description        Continue to OMIT all warfarin doses. Continue Lovenox 80mg  administered subcutaneously as was demonstrated today--level of the belly button--2 inches away from the belly button, alternating sites. You will call Pharmacist Paulla Dolly at [phone] to indicate WHEN the procedure is PLANNED and I will then indicate to you when your LAST dose of Lovenox will be administered.        ANTICOAG SUMMARY: Anticoagulation Episode Summary    Current INR goal 2.0-3.0  Next INR check 06/19/2015  INR from last check 1.70! (06/13/2015)  Weekly max dose   Target end date Indefinite  [facility]  Preferred lab   Send INR reminders to    Indications  Long term (current) use of anticoagulants [Z79.01] CVA (cerebral infarction) [I63.9] Chronic atrial fibrillation ([place]) [I48.2]        Comments         ANTICOAG TODAY: Anticoagulation Summary as of 06/13/2015    INR goal 2.0-3.0  Selected INR 1.70! (06/13/2015)  Next INR check 06/19/2015  Target end date Indefinite   Indications  Long term (current) use of anticoagulants [Z79.01] CVA (cerebral  infarction) [I63.9] Chronic atrial fibrillation (Jonathan Reed) [I48.2]      Anticoagulation Episode Summary    [facility]   Preferred lab    Send INR reminders to    Comments       PATIENT INSTRUCTIONS: Patient Instructions  Patient instructed to take medications as defined in the Anti-coagulation Track section of this encounter.  Patient instructed to OMIT today's dose of warfarin . DISCONTINUE LOVENOX (Last dose was at 0900h to day June 13, 2015---providing 24++ hours OFF Lovenox before planned liver biopsy tomorrow Wednesday June 14, 2015 at 1045h).  Patient verbalized understanding of these instructions.       FOLLOW-UP Return in 6 days (on 06/19/2015) for Follow up visit after biopsy at 0900h.  [name], III Pharm.D., CACP

## 2015-06-13 NOTE — Patient Instructions (Signed)
Patient instructed to take medications as defined in the Anti-coagulation Track section of this encounter.  Patient instructed to OMIT today's dose of warfarin . DISCONTINUE LOVENOX (Last dose was at 0900h to day June 13, 2015---providing 24++ hours OFF Lovenox before planned liver biopsy tomorrow Wednesday June 14, 2015 at 1045h).  Patient verbalized understanding of these instructions.

## 2015-06-14 ENCOUNTER — Other Ambulatory Visit: Payer: Self-pay | Admitting: Family

## 2015-06-14 ENCOUNTER — Ambulatory Visit (HOSPITAL_COMMUNITY)
Admission: RE | Admit: 2015-06-14 | Discharge: 2015-06-14 | Disposition: A | Discharge status: Home / Self Care | Payer: Medicare Other | Source: Ambulatory Visit | Attending: Family | Admitting: Family

## 2015-06-14 ENCOUNTER — Ambulatory Visit (HOSPITAL_COMMUNITY)
Admission: RE | Admit: 2015-06-14 | Discharge: 2015-06-14 | Disposition: A | Discharge status: Home / Self Care | Payer: Medicare Other | Source: Ambulatory Visit | Attending: Hematology & Oncology | Admitting: Hematology & Oncology

## 2015-06-14 ENCOUNTER — Telehealth: Payer: Self-pay | Admitting: Pharmacist

## 2015-06-14 DIAGNOSIS — C7B8 Other secondary neuroendocrine tumors: Secondary | ICD-10-CM

## 2015-06-14 LAB — PROTIME-INR
INR: 1.37 (ref 0.00–1.49)
Prothrombin Time: 17 seconds — ABNORMAL HIGH (ref 11.6–15.2)

## 2015-06-14 LAB — CBC
HCT: 36.5 % — ABNORMAL LOW (ref 39.0–52.0)
Hemoglobin: 12.9 g/dL — ABNORMAL LOW (ref 13.0–17.0)
MCH: 30.8 pg (ref 26.0–34.0)
MCHC: 35.3 g/dL (ref 30.0–36.0)
MCV: 87.1 fL (ref 78.0–100.0)
PLATELETS: 56 10*3/uL — AB (ref 150–400)
RBC: 4.19 MIL/uL — ABNORMAL LOW (ref 4.22–5.81)
RDW: 20.3 % — AB (ref 11.5–15.5)
WBC: 3.3 10*3/uL — AB (ref 4.0–10.5)

## 2015-06-14 LAB — APTT: aPTT: 35 seconds (ref 24–37)

## 2015-06-14 MED ORDER — MIDAZOLAM HCL 2 MG/2ML IJ SOLN
INTRAMUSCULAR | Status: AC | PRN
  Administered 2015-06-14: 0.5 mg via INTRAVENOUS
  Administered 2015-06-14 (×2): 1 mg via INTRAVENOUS

## 2015-06-14 MED ORDER — SODIUM CHLORIDE 0.9 % IV SOLN: INTRAVENOUS | Status: DC

## 2015-06-14 MED ORDER — FENTANYL CITRATE (PF) 100 MCG/2ML IJ SOLN
INTRAMUSCULAR | Status: AC
  Filled 2015-06-14: qty 4

## 2015-06-14 MED ORDER — FENTANYL CITRATE (PF) 100 MCG/2ML IJ SOLN
INTRAMUSCULAR | Status: AC | PRN
  Administered 2015-06-14 (×2): 25 ug via INTRAVENOUS

## 2015-06-14 MED ORDER — MIDAZOLAM HCL 2 MG/2ML IJ SOLN
INTRAMUSCULAR | Status: AC
  Filled 2015-06-14: qty 6

## 2015-06-14 NOTE — Telephone Encounter (Signed)
[name]:  Thanks again for all of your expertise!!!!  You really have simplified my life!!!  [name]

## 2015-06-14 NOTE — Sedation Documentation (Signed)
Patient denies pain and is resting comfortably.  

## 2015-06-14 NOTE — Procedures (Signed)
Successful US guided paracentesis yielding 1.3 L of serous ascitic fluid. Sample sent to laboratory as requested.  Technically successful US guided biopsy of infiltrative masses within the right lobe of the liver.   EBL: Minimal   No immediate complications.   Ronny Bacon, MD Pager #: [phone]

## 2015-06-14 NOTE — H&P (Signed)
Referring Physician(s): [place],Sarah M/Ennever,Peter  Supervising Physician: Sandi Mariscal  Chief Complaint: "I'm getting a liver biopsy"   Subjective: Pt familiar to IR service from prior liver biopsy in January 2014 as well as hepatic Y 90 radioembolization 2, most recently on 12/05/14. He has a known history of well-differentiated neuroendocrine carcinoma with liver metastases. Recent follow-up CT scan of abdomen/pelvis on 05/31/15 has revealed progression of hepatomegaly with widespread progressive hepatic metastases and small to moderate volume of ascites. He presents again today for ultrasound-guided liver lesion biopsy and possible paracentesis for further evaluation. He currently denies fever, headache, chest pain, dyspnea, worsening abdominal pain, nausea, vomiting or abnormal bleeding. He has experienced weight loss, diminished appetite, lower extremity weakness, right lower extremity edema and some abdominal fullness. Additional past medical history as below.   Past Medical History  Diagnosis Date  . CHF (congestive heart failure) ([place])     with ICD (f/u with [facility])  . CVA (cerebral infarction) 2011    No residual Sx  . CAD (coronary artery disease) 2000  . HTN (hypertension)   . Nausea alone 01/06/2013  . Abdominal pain, unspecified site 01/06/2013  . Stroke ([place])   . Tooth ache 01/04/2014  . Bronchitis 09/23/2014  . Gout   . Carcinoid syndrome ([place])   . Metastatic malignant neuroendocrine tumor to liver ([place])   . Metastasis to liver [facility]) 10/06/2013   Past Surgical History  Procedure Laterality Date  . Cardiac defibrillator placement    . Inguinal hernia repair Bilateral   . Sinus surgery with instatrak    . Colonoscopy  1980    colon polyps, no f/u since.      Allergies: Review of patient's allergies indicates no active allergies.  Medications: Prior to Admission medications   Medication Sig Start Date End Date Taking? Authorizing Provider   guaiFENesin-dextromethorphan (ROBITUSSIN DM) 100-10 MG/5ML syrup Take 5 mLs by mouth every 4 (four) hours as needed for cough. 05/24/15  Yes Ejiroghene E Emokpae, MD  lisinopril (PRINIVIL,ZESTRIL) 20 MG tablet Take 1 tablet (20 mg total) by mouth daily. 11/16/14 11/16/15 Yes Maryellen Pile, MD  metoprolol succinate (TOPROL-XL) 50 MG 24 hr tablet Take 1 tablet (50 mg total) by mouth daily. Take with or immediately following a meal. Patient taking differently: Take 100 mg by mouth daily. Take with or immediately following a meal. 06/29/13  Yes Blain Pais, MD  colchicine (COLCRYS) 0.6 MG tablet Take 1 tablet (0.6 mg total) by mouth as needed (For acute gout flare up.). Take 2 tabs (1.2 mg) then 1 tab (0.6 mg) one hour later. 11/24/14   Zada Finders, MD  megestrol (MEGACE) 40 MG/ML suspension Take 10 mLs (400 mg total) by mouth 2 (two) times daily. 06/08/15   Volanda Napoleon, MD  naproxen sodium (ANAPROX) 220 MG tablet Take 220 mg by mouth 2 (two) times daily as needed (pain). Reported on 04/27/2015    Historical Provider, MD  warfarin (COUMADIN) 5 MG tablet Take 1/2 tablet on Mondays/Thursdays; take 1 tablet all other days. Patient not taking: Reported on 06/13/2015 01/09/15   Oval Linsey, MD     Vital Signs: BP 124/84 mmHg  Pulse 64  Resp 18  SpO2 100%  Physical Exam patient awake, alert. Chest clear to auscultation bilaterally. Left chest wall pacer in place. Heart with irregularly irregular rhythm. 1/ 6 systolic ejection murmur. Abdomen soft, distended, marked pattern megaly, nontender. 1+ right lower extremity edema, no left lower extremity edema.  Imaging: No results  found.  Labs:  CBC:  Recent Labs  04/27/15 0918 05/24/15 1011 05/25/15 0946 06/08/15 1155 06/14/15 1120  WBC 5.0 CANCELED 3.4* 2.8* 3.3*  HGB 14.0  --  14.0 14.8 12.9*  HCT 39.6 CANCELED 39.3 41.0 36.5*  PLT 122* CANCELED 38 Platelet count confirmed by slide estimate* 65 Platelet count consistent in citrate*  PENDING    COAGS:  Recent Labs  10/21/14 0732  11/07/14 0810  12/05/14 0755  05/24/15 0904 06/12/15 1001 06/13/15 1533 06/14/15 1120  INR 1.10  < > 1.28  < > 1.21  < > 3.0 1.80 1.70 1.37  APTT 32  --  33  --  37  --   --   --   --  35  < > = values in this interval not displayed.  BMP:  Recent Labs  12/05/14 0755 02/08/15 1059 02/14/15 1406 03/30/15 1100 04/27/15 0919 05/24/15 1011 06/08/15 1156  NA 137  --  141 142 139 140 142  K 4.0  --  4.4 4.6 4.3 4.2 4.1  CL 104  --  106 110  --  103 104  CO2 23  --  29 21* 21* 23 27  GLUCOSE 119*  --  88 93 84 109* 102  BUN 16 17 19 16  13.6 15 13   CALCIUM 9.6  --  9.5 9.3 9.1 8.7 8.5  CREATININE 1.32* 1.00 1.15 1.18 0.9 1.15 1.2  GFRNONAA 55* 78  --  >60  --  65  --   GFRAA >60 >89  --  >60  --  76  --     LIVER FUNCTION TESTS:  Recent Labs  03/30/15 1100 04/27/15 0919 05/24/15 1011 06/08/15 1156  BILITOT 1.5* 3.84* 3.7* 4.90*  AST 67* 71* 73* 70*  ALT 45 40 41 41  ALKPHOS 479* 464* 489* 438*  PROT 6.1* 6.3* 6.0 6.0*  ALBUMIN 2.6* 2.3* 2.9* 2.4*    Assessment and Plan: Pt with known history of well-differentiated neuroendocrine carcinoma with liver metastases, status post hepatic Y 90 radioembolization 2, most recently in September 2016.  Follow-up CT scan of abdomen/pelvis on 05/31/15 has revealed progression of hepatomegaly with widespread progressive hepatic metastases and small to moderate volume of ascites. He presents again today for ultrasound-guided liver lesion biopsy and possible paracentesis for further evaluation.Risks and benefits discussed with the patient/family including, but not limited to bleeding, infection, damage to adjacent structures or low yield requiring additional tests.All of the patient's questions were answered, patient is agreeable to proceed.Consent signed and in chart.Platelet count today 56 k.     Electronically Signed: D. Rowe Robert 06/14/2015, 11:52 AM   I spent a total of  20 minutes at the the patient's bedside AND on the patient's hospital floor or unit, greater than 50% of which was counseling/coordinating care for ultrasound-guided liver lesion biopsy and possible paracentesis

## 2015-06-14 NOTE — Telephone Encounter (Signed)
Call from [facility] of patient indicating that liver biopsy performed today. Was instructed by IR physician to resume warfarin tomorrow as well as Lovenox. Will resume 80mg  SQ q12h at 0900/2100h with warfarin 2.5mg  qd except 5mg  MWF. Will see patient on follow up Monday 3-APR-17 at 1000h.  Instructed to call me with any concerns: bleeding, nausea, vomiting, abdominal pain, etc.

## 2015-06-14 NOTE — Progress Notes (Signed)
INTERNAL MEDICINE TEACHING ATTENDING ADDENDUM - Jonathan Reed M.D  Duration- indefinte, Indication- CVA, INR- subtherapeutic. Agree with pharmacy recommendations as outlined in their note. Coumadin is on hold for liver biopsy and patient is being bridged with lovenox. Coumadin to be resumed after liver biopsy

## 2015-06-14 NOTE — Discharge Instructions (Signed)
Liver Biopsy °The liver is a large organ in the upper right-hand side of your abdomen. A liver biopsy is a procedure in which a tissue sample is taken from the liver and examined under a microscope. The procedure is done to confirm a suspected problem. °There are three types of liver biopsies: °· Percutaneous. In this type, an incision is made in your abdomen. The sample is removed through the incision with a needle. °· Laparoscopic. In this type, several incisions are made in the abdomen. A tiny camera is passed through one of the incisions to help guide the health care provider. The sample is removed through the other incision or incisions. °· Transjugular. In this type, an incision is made in the neck. A tube is passed through the incision to the liver. The sample is removed through the tube with a needle. °LET YOUR HEALTH CARE PROVIDER KNOW ABOUT: °· Any allergies you have. °· All medicines you are taking, including vitamins, herbs, eye drops, creams, and over-the-counter medicines. °· Previous problems you or members of your family have had with the use of anesthetics. °· Any blood disorders you have. °· Previous surgeries you have had. °· Medical conditions you have. °· Possibility of pregnancy, if this applies. °RISKS AND COMPLICATIONS °Generally, this is a safe procedure. However, problems can occur and include: °· Bleeding. °· Infection. °· Bruising. °· Collapsed lung. °· Leak of digestive juices (bile) from the liver or gallbladder. °· Problems with heart rhythm. °· Pain at the biopsy site or in the right shoulder. °· Low blood pressure (hypotension). °· Injury to nearby organs or tissues. °BEFORE THE PROCEDURE °· Your health care provider may do some blood or urine tests. These will help your health care provider learn how well your kidneys and liver are working and how well your blood clots. °· Ask your health care provider if you will be able to go home the day of the procedure. Arrange for someone to  take you home and stay with you for at least 24 hours. °· Do not eat or drink anything after midnight on the night before the procedure or as directed by your health care provider. °· Ask your health care provider about: °· Changing or stopping your regular medicines. This is especially important if you are taking diabetes medicines or blood thinners. °· Taking medicines such as aspirin and ibuprofen. These medicines can thin your blood. Do not take these medicines before your procedure if your health care provider asks you not to. °PROCEDURE °Regardless of the type of biopsy that will be done, you will have an IV line placed. Through this line, you will receive fluids and medicine to relax you. If you will be having a laparoscopic biopsy, you may also receive medicine through this line to make you sleep during the procedure (general anesthetic). °Percutaneous Liver Biopsy °· You will positioned on your back, with your right hand over your head. °· A health care provider will locate your liver by tapping and pressing on the right side of your abdomen or with the help of an ultrasound machine or CT scan. °· An area at the bottom of your last right rib will be numbed. °· An incision will be made in the numbed area. °· The biopsy needle will be inserted into the incision. °· Several samples of liver tissue will be taken with the biopsy needle. You will be asked to hold your breath as each sample is taken. °Laparoscopic Liver Biopsy °· You will be   positioned on your back. °· Several small incisions will be made in your abdomen. °· Your doctor will pass a tiny camera through one incision. The camera will allow the liver to be viewed on a TV monitor in the operating room. °· Tools will be passed through the other incision or incisions. These tools will be used to remove samples of liver tissue. °Transjugular Liver Biopsy °· You will be positioned on your back on an X-ray table, with your head turned to your left. °· An  area on your neck just over your jugular vein will be numbed. °· An incision will be made in the numbed area. °· A tiny tube will be inserted through the incision. It will be pushed through the jugular vein to a blood vessel in the liver called the hepatic vein. °· Dye will be inserted through the tube, and X-rays will be taken. The dye will make the blood vessels in the liver light up on the X-rays. °· The biopsy needle will be pushed through the tube until it reaches the liver. °· Samples of liver tissue will be taken with the biopsy needle. °· The needle and the tube will be removed. °After the samples are obtained, the incision or incisions will be closed. °AFTER THE PROCEDURE °· You will be taken to a recovery area. °· You may have to lie on your right side for 1-2 hours. This will prevent bleeding from the biopsy site. °· Your progress will be watched. Your blood pressure, pulse, and the biopsy site will be checked often. °· You may have some pain or feel sick. If this happens, tell your health care provider. °· As you begin to feel better, you will be offered ice and beverages. °· You may be allowed to go home when the medicines have worn off and you can walk, drink, eat, and use the bathroom. °  °This information is not intended to replace advice given to you by your health care provider. Make sure you discuss any questions you have with your health care provider. °  °Document Released: 05/25/2003 Document Revised: 03/25/2014 Document Reviewed: 04/30/2013 °Elsevier Interactive Patient Education ©2016 [employer] °Liver Biopsy, Care After °Refer to this sheet in the next few weeks. These instructions provide you with information on caring for yourself after your procedure. Your health care provider may also give you more specific instructions. Your treatment has been planned according to current medical practices, but problems sometimes occur. Call your health care provider if you have any problems or  questions after your procedure. °WHAT TO EXPECT AFTER THE PROCEDURE °After your procedure, it is typical to have the following: °· A small amount of discomfort in the area where the biopsy was done and in the right shoulder or shoulder blade. °· A small amount of bruising around the area where the biopsy was done and on the skin over the liver. °· Sleepiness and fatigue for the rest of the day. °HOME CARE INSTRUCTIONS  °· Rest at home for 1-2 days or as directed by your health care provider. °· Have a friend or family member stay with you for at least 24 hours. °· Because of the medicines used during the procedure, you should not do the following things in the first 24 hours: °¨ Drive. °¨ Use machinery. °¨ Be responsible for the care of other people. °¨ Sign legal documents. °¨ Take a bath or shower. °· There are many different ways to close and cover an incision, including stitches,   skin glue, and adhesive strips. Follow your health care provider's instructions on: °¨ Incision care. °¨ Bandage (dressing) changes and removal. °¨ Incision closure removal. °· Do not drink alcohol in the first week. °· Do not lift more than 5 pounds or play contact sports for 2 weeks after this test. °· Take medicines only as directed by your health care provider. Do not take medicine containing aspirin or non-steroidal anti-inflammatory medicines such as ibuprofen for 1 week after this test. °· It is your responsibility to get your test results. °SEEK MEDICAL CARE IF:  °· You have increased bleeding from an incision that results in more than a small spot of blood. °· You have redness, swelling, or increasing pain in any incisions. °· You notice a discharge or a bad smell coming from any of your incisions. °· You have a fever or chills. °SEEK IMMEDIATE MEDICAL CARE IF:  °· You develop swelling, bloating, or pain in your abdomen. °· You become dizzy or faint. °· You develop a rash. °· You are nauseous or vomit. °· You have difficulty  breathing, feel short of breath, or feel faint. °· You develop chest pain. °· You have problems with your speech or vision. °· You have trouble balancing or moving your arms or legs. °  °This information is not intended to replace advice given to you by your health care provider. Make sure you discuss any questions you have with your health care provider. °  °Document Released: 09/21/2004 Document Revised: 03/25/2014 Document Reviewed: 04/30/2013 °Elsevier Interactive Patient Education ©2016 [employer] °Moderate Conscious Sedation, Adult, Care After °Refer to this sheet in the next few weeks. These instructions provide you with information on caring for yourself after your procedure. Your health care provider may also give you more specific instructions. Your treatment has been planned according to current medical practices, but problems sometimes occur. Call your health care provider if you have any problems or questions after your procedure. °WHAT TO EXPECT AFTER THE PROCEDURE  °After your procedure: °· You may feel sleepy, clumsy, and have poor balance for several hours. °· Vomiting may occur if you eat too soon after the procedure. °HOME CARE INSTRUCTIONS °· Do not participate in any activities where you could become injured for at least 24 hours. Do not: °¨ Drive. °¨ Swim. °¨ Ride a bicycle. °¨ Operate heavy machinery. °¨ Cook. °¨ Use power tools. °¨ Climb ladders. °¨ Work from a high place. °· Do not make important decisions or sign legal documents until you are improved. °· If you vomit, drink water, juice, or soup when you can drink without vomiting. Make sure you have little or no nausea before eating solid foods. °· Only take over-the-counter or prescription medicines for pain, discomfort, or fever as directed by your health care provider. °· Make sure you and your family fully understand everything about the medicines given to you, including what side effects may occur. °· You should not drink alcohol,  take sleeping pills, or take medicines that cause drowsiness for at least 24 hours. °· If you smoke, do not smoke without supervision. °· If you are feeling better, you may resume normal activities 24 hours after you were sedated. °· Keep all appointments with your health care provider. °SEEK MEDICAL CARE IF: °· Your skin is pale or bluish in color. °· You continue to feel nauseous or vomit. °· Your pain is getting worse and is not helped by medicine. °· You have bleeding or swelling. °· You are   still sleepy or feeling clumsy after 24 hours. °SEEK IMMEDIATE MEDICAL CARE IF: °· You develop a rash. °· You have difficulty breathing. °· You develop any type of allergic problem. °· You have a fever. °MAKE SURE YOU: °· Understand these instructions. °· Will watch your condition. °· Will get help right away if you are not doing well or get worse. °  °This information is not intended to replace advice given to you by your health care provider. Make sure you discuss any questions you have with your health care provider. °  °Document Released: 12/23/2012 Document Revised: 03/25/2014 Document Reviewed: 12/23/2012 °Elsevier Interactive Patient Education ©2016 [employer] Inc. ° °

## 2015-06-19 ENCOUNTER — Ambulatory Visit (INDEPENDENT_AMBULATORY_CARE_PROVIDER_SITE_OTHER): Payer: Medicare Other | Admitting: Pharmacist

## 2015-06-19 DIAGNOSIS — I482 Chronic atrial fibrillation, unspecified: Secondary | ICD-10-CM

## 2015-06-19 DIAGNOSIS — Z7901 Long term (current) use of anticoagulants: Secondary | ICD-10-CM | POA: Diagnosis not present

## 2015-06-19 DIAGNOSIS — Z8673 Personal history of transient ischemic attack (TIA), and cerebral infarction without residual deficits: Secondary | ICD-10-CM | POA: Diagnosis not present

## 2015-06-19 DIAGNOSIS — I639 Cerebral infarction, unspecified: Secondary | ICD-10-CM

## 2015-06-19 LAB — POCT INR: INR: 2.1

## 2015-06-19 NOTE — Progress Notes (Signed)
INTERNAL MEDICINE TEACHING ATTENDING ADDENDUM - Victoriana Aziz M.D  Duration- indefinite, Indication- atrial fibrillation, INR- therapeutic. Agree with pharmacy recommendations as outlined in their note.    

## 2015-06-19 NOTE — Progress Notes (Signed)
Anticoagulation Management Jonathan Reed is a 70 y.o. male who reports to the clinic for monitoring of warfarin treatment.    Indication: atrial fibrillation Duration: indefinite  Anticoagulation Clinic Visit History: Patient does not report signs/symptoms of bleeding or thromboembolism   Anticoagulation Episode Summary    Current INR goal 2.0-3.0  Next INR check 06/26/2015  INR from last check   Weekly max dose   Target end date Indefinite  [facility]  Preferred lab   Send INR reminders to    Indications  Long term (current) use of anticoagulants [Z79.01] CVA (cerebral infarction) [I63.9] Chronic atrial fibrillation ([place]) [I48.2]        Comments        ASSESSMENT Recent Results: The most recent result is correlated with 22.5 mg per week: Lab Results  Component Value Date   INR 2.1 06/19/2015   INR 1.37 06/14/2015   INR 1.70 06/13/2015   PROTIME 30.0* 05/15/2012   Anticoagulation Dosing: INR as of 06/19/2015 and Previous Dosing Information    INR Dt INR Goal Wkly Tot Sun Mon Tue Wed Thu Fri Sat     2.0-3.0 0 mg 0 mg 0 mg 0 mg 0 mg 0 mg 0 mg 0 mg    Previous description        Continue to OMIT all warfarin doses. Continue Lovenox 80mg  administered subcutaneously as was demonstrated today--level of the belly button--2 inches away from the belly button, alternating sites. You will call Pharmacist Paulla Dolly at [phone] to indicate WHEN the procedure is PLANNED and I will then indicate to you when your LAST dose of Lovenox will be administered.     Anticoagulation Dose Instructions as of 06/19/2015      Total Sun Mon Tue Wed Thu Fri Sat   New Dose 22.5 mg 5 mg 2.5 mg 2.5 mg 2.5 mg 2.5 mg 2.5 mg 5 mg     (5 mg x 1)  (5 mg x 0.5)  (5 mg x 0.5)  (5 mg x 0.5)  (5 mg x 0.5)  (5 mg x 0.5)  (5 mg x 1)                           INR today: Therapeutic  PLAN Weekly dose was resumed at 22.5 mg.  Patient Instructions  Patient educated about  medication as defined in this encounter and verbalized understanding by repeating back instructions provided.  Patient advised to contact clinic or seek medical attention if signs/symptoms of bleeding or thromboembolism occur.  Patient verbalized understanding by repeating back information and was advised to contact me if further medication-related questions arise. Patient was also provided an information handout.  Follow-up No Follow-up on file.  Kim,Jennifer J  15 minutes spent face-to-face with the patient during the encounter. 50% of time spent on education. 50% of time was spent on assessment and plan.

## 2015-06-19 NOTE — Patient Instructions (Signed)
Patient educated about medication as defined in this encounter and verbalized understanding by repeating back instructions provided.   

## 2015-06-22 ENCOUNTER — Ambulatory Visit (HOSPITAL_BASED_OUTPATIENT_CLINIC_OR_DEPARTMENT_OTHER): Payer: Medicare Other

## 2015-06-22 ENCOUNTER — Telehealth: Payer: Self-pay | Admitting: *Deleted

## 2015-06-22 ENCOUNTER — Encounter: Payer: Self-pay | Admitting: Hematology & Oncology

## 2015-06-22 ENCOUNTER — Ambulatory Visit (HOSPITAL_BASED_OUTPATIENT_CLINIC_OR_DEPARTMENT_OTHER): Payer: Medicare Other | Admitting: Hematology & Oncology

## 2015-06-22 ENCOUNTER — Other Ambulatory Visit: Payer: Self-pay | Admitting: *Deleted

## 2015-06-22 ENCOUNTER — Other Ambulatory Visit (HOSPITAL_BASED_OUTPATIENT_CLINIC_OR_DEPARTMENT_OTHER): Payer: Medicare Other

## 2015-06-22 VITALS — BP 107/82 | HR 79 | Temp 98.0°F | Resp 16 | Ht 71.0 in | Wt 184.0 lb

## 2015-06-22 DIAGNOSIS — D696 Thrombocytopenia, unspecified: Secondary | ICD-10-CM | POA: Diagnosis not present

## 2015-06-22 DIAGNOSIS — C7A8 Other malignant neuroendocrine tumors: Secondary | ICD-10-CM

## 2015-06-22 DIAGNOSIS — R188 Other ascites: Secondary | ICD-10-CM

## 2015-06-22 DIAGNOSIS — C7B8 Other secondary neuroendocrine tumors: Secondary | ICD-10-CM

## 2015-06-22 DIAGNOSIS — I255 Ischemic cardiomyopathy: Secondary | ICD-10-CM | POA: Diagnosis not present

## 2015-06-22 DIAGNOSIS — R160 Hepatomegaly, not elsewhere classified: Secondary | ICD-10-CM

## 2015-06-22 LAB — CBC WITH DIFFERENTIAL (CANCER CENTER ONLY)
BASO#: 0 10*3/uL (ref 0.0–0.2)
BASO%: 0.4 % (ref 0.0–2.0)
EOS%: 1.5 % (ref 0.0–7.0)
Eosinophils Absolute: 0.1 10*3/uL (ref 0.0–0.5)
HEMATOCRIT: 35.8 % — AB (ref 38.7–49.9)
HGB: 13.3 g/dL (ref 13.0–17.1)
LYMPH#: 1 10*3/uL (ref 0.9–3.3)
LYMPH%: 20.8 % (ref 14.0–48.0)
MCH: 32 pg (ref 28.0–33.4)
MCHC: 37.2 g/dL — ABNORMAL HIGH (ref 32.0–35.9)
MCV: 86 fL (ref 82–98)
MONO#: 0.7 10*3/uL (ref 0.1–0.9)
MONO%: 15.5 % — ABNORMAL HIGH (ref 0.0–13.0)
NEUT#: 2.9 10*3/uL (ref 1.5–6.5)
NEUT%: 61.8 % (ref 40.0–80.0)
Platelets: 35 10*3/uL — ABNORMAL LOW (ref 145–400)
RBC: 4.16 10*6/uL — ABNORMAL LOW (ref 4.20–5.70)
RDW: 22.7 % — AB (ref 11.1–15.7)
WBC: 4.7 10*3/uL (ref 4.0–10.0)

## 2015-06-22 LAB — CMP (CANCER CENTER ONLY)
ALBUMIN: 2.1 g/dL — AB (ref 3.3–5.5)
ALK PHOS: 316 U/L — AB (ref 26–84)
ALT: 35 U/L (ref 10–47)
AST: 57 U/L — ABNORMAL HIGH (ref 11–38)
BUN, Bld: 14 mg/dL (ref 7–22)
CALCIUM: 9.1 mg/dL (ref 8.0–10.3)
CO2: 24 mEq/L (ref 18–33)
Chloride: 109 mEq/L — ABNORMAL HIGH (ref 98–108)
Creat: 1 mg/dl (ref 0.6–1.2)
GLUCOSE: 110 mg/dL (ref 73–118)
POTASSIUM: 4 meq/L (ref 3.3–4.7)
Sodium: 139 mEq/L (ref 128–145)
Total Bilirubin: 6.3 mg/dl (ref 0.20–1.60)
Total Protein: 5.5 g/dL — ABNORMAL LOW (ref 6.4–8.1)

## 2015-06-22 LAB — TECHNOLOGIST REVIEW CHCC SATELLITE

## 2015-06-22 MED ORDER — EVEROLIMUS 5 MG PO TABS: 5.0000 mg | ORAL_TABLET | Freq: Every day | ORAL | Status: DC

## 2015-06-22 MED ORDER — LANREOTIDE ACETATE 120 MG/0.5ML ~~LOC~~ SOLN
120.0000 mg | Freq: Once | SUBCUTANEOUS | Status: AC
  Administered 2015-06-22: 120 mg via SUBCUTANEOUS
  Filled 2015-06-22: qty 120

## 2015-06-22 NOTE — Progress Notes (Signed)
Hematology and Oncology Follow Up Visit  Jonathan Reed [id number] 01/18/1946 70 y.o. 06/22/2015   Principle Diagnosis:   Low-grade Neuroendocrine carcinoma with liver metastasis  Current Therapy:    Somatuline 120 mg subcutaneous every month  Afinitor 5mg  po q day     Interim History:  Jonathan Reed is back for follow-up. He still having some problems with his liver.  When I last saw him, I was worried about the possibility of his carcinoid tumor is becoming more aggressive. As such, he went ahead and got a biopsy. The biopsy was done on March 29. The pathology report [id number]) showed metastatic well-differentiated neuroendocrine tumors. There is no change from his last biopsy.  He did have some ascites. The cytology on this ascites was negative for malignancy.  His liver is still quite large.  He has ischemic cardiomyopathy. I just wonder if this may not be the source of his hepatomegaly.  I want to get a echocardiogram to see was ejection fraction is.  His cardiologist is down in [facility].  He is on Coumadin. He does have some thrombocytopenia. There's not been any bleeding. He had no problems with his biopsy.  He still is not eating much. I think this has some to do with his hepatomegaly .  He seems to be having no problem with fever. He's had no cough. He's had some leg swelling.  I think he needs a handicapped parking pass. It just is very hard for him to walk any significant distance.  I also talked to he and his family about starting Afinitor. I think this might be reasonable. We will have to adjust his dose because of his bilirubin.  Currently, his performance status is ECOG 2.   Medications:  Current outpatient prescriptions:  .  guaiFENesin-dextromethorphan (ROBITUSSIN DM) 100-10 MG/5ML syrup, Take 5 mLs by mouth every 4 (four) hours as needed for cough., Disp: 118 mL, Rfl: 0 .  lisinopril (PRINIVIL,ZESTRIL) 20 MG tablet, Take 1 tablet (20 mg total) by mouth  daily., Disp: 30 tablet, Rfl: 11 .  megestrol (MEGACE) 40 MG/ML suspension, Take 10 mLs (400 mg total) by mouth 2 (two) times daily., Disp: 480 mL, Rfl: 3 .  metoprolol succinate (TOPROL-XL) 50 MG 24 hr tablet, Take 1 tablet (50 mg total) by mouth daily. Take with or immediately following a meal. (Patient taking differently: Take 100 mg by mouth daily. Take with or immediately following a meal.), Disp: 30 tablet, Rfl: 2 .  naproxen sodium (ANAPROX) 220 MG tablet, Take 220 mg by mouth 2 (two) times daily as needed (pain). Reported on 04/27/2015, Disp: , Rfl:  .  warfarin (COUMADIN) 5 MG tablet, Take 1/2 tablet on Mondays/Thursdays; take 1 tablet all other days., Disp: 30 tablet, Rfl: 2 .  everolimus (AFINITOR) 5 MG tablet, Take 1 tablet (5 mg total) by mouth daily., Disp: 30 tablet, Rfl: 2  Allergies:  No Active Allergies  Past Medical History, Surgical history, Social history, and Family History were reviewed and updated.  Review of Systems: As above  Physical Exam:  height is 5\' 11"  (1.803 m) and weight is 184 lb (83.462 kg). His oral temperature is 98 F (36.7 C). His blood pressure is 107/82 and his pulse is 79. His respiration is 16.   Wt Readings from Last 3 Encounters:  06/22/15 184 lb (83.462 kg)  06/08/15 179 lb (81.194 kg)  05/25/15 177 lb (80.287 kg)     Thin African-American male. Head and neck exam shows no  ocular or oral lesions. He has no palpable cervical or supraclavicular lymph nodes. His pacemaker is in the upper part of the left anterior chest wall. Lungs are clear. Cardiac exam irregular rate and rhythm consistent with atrial fibrillation. He has 1/6 systolic ejection murmur. Abdomen is soft. He has good bowel sounds. Has marked hepatomegaly. His liver edge is probably 6 mL below the right costal margin. His liver extends across the midline.  below the left costal margin.Marland Kitchen Extremity shows no clubbing, cyanosis or edema. There may be a little bit of nonpitting edema of the  right lower leg. No venous cord is noted in the legs. He has a negative Homans sign. His good range motion of his joints. Skin exam shows no rashes. Neurological exam is nonfocal.  Lab Results  Component Value Date   WBC 4.7 06/22/2015   HGB 13.3 06/22/2015   HCT 35.8* 06/22/2015   MCV 86 06/22/2015   PLT 35* 06/22/2015     Chemistry      Component Value Date/Time   NA 139 06/22/2015 1505   NA 140 05/24/2015 1011   NA 139 04/27/2015 0919   NA 142 03/30/2015 1100   K 4.0 06/22/2015 1505   K 4.2 05/24/2015 1011   K 4.3 04/27/2015 0919   CL 109* 06/22/2015 1505   CL 103 05/24/2015 1011   CL 105 08/24/2012 0849   CO2 24 06/22/2015 1505   CO2 23 05/24/2015 1011   CO2 21* 04/27/2015 0919   BUN 14 06/22/2015 1505   BUN 15 05/24/2015 1011   BUN 13.6 04/27/2015 0919   BUN 16 03/30/2015 1100   CREATININE 1.0 06/22/2015 1505   CREATININE 1.15 05/24/2015 1011   CREATININE 0.9 04/27/2015 0919      Component Value Date/Time   CALCIUM 9.1 06/22/2015 1505   CALCIUM 8.7 05/24/2015 1011   CALCIUM 9.1 04/27/2015 0919   ALKPHOS 316* 06/22/2015 1505   ALKPHOS 489* 05/24/2015 1011   ALKPHOS 464* 04/27/2015 0919   AST 57* 06/22/2015 1505   AST 73* 05/24/2015 1011   AST 71* 04/27/2015 0919   ALT 35 06/22/2015 1505   ALT 41 05/24/2015 1011   ALT 40 04/27/2015 0919   BILITOT 6.30* 06/22/2015 1505   BILITOT 3.7* 05/24/2015 1011   BILITOT 3.84* 04/27/2015 0919   BILITOT 1.5* 03/30/2015 1100         Impression and Plan: Jonathan Reed is a 70 year old Afro-American male. He has a well-differentiated neuroendocrine tumor. He has liver metastasis.  Again, I worry that the hepatomegaly might not be from his underlying malignancy. An echocardiogram will help [place]. We will get this next week.  His platelet count is down. However, I think it is still okay for him to be on Coumadin.  Overall, I'm just worried that his liver is having more difficulty. I just suspect that this might be passive  congestive hepatomegaly from cardiac dysfunction. He does have a pacemaker defibrillator implanted.  I spent about 45 minutes with he and his family. They are very nice. They do understand the seriousness of his problem. I just wanted to have a better quality of life.  I want to see him back in 2 weeks.

## 2015-06-22 NOTE — Patient Instructions (Signed)
Lanreotide injection What is this medicine? LANREOTIDE (lan REE oh tide) is used to reduce blood levels of growth hormone in patients with a condition called acromegaly. It also works to slow or stop tumor growth in patients with gastroenteropancreatic neuroendocrine tumor (GEP-NET). This medicine may be used for other purposes; ask your health care provider or pharmacist if you have questions. What should I tell my health care provider before I take this medicine? They need to know if you have any of these conditions: -diabetes -gallbladder disease -heart disease -kidney disease -liver disease -an unusual or allergic reaction to lanreotide, other medicines, latex, foods, dyes, or preservatives -pregnant or trying to get pregnant -breast-feeding How should I use this medicine? This medicine is for injection under the skin. It is given by a health care professional in a hospital or clinic setting. Contact your pediatrician or health care professional regarding the use of this medicine in children. Special care may be needed. Overdosage: If you think you have taken too much of this medicine contact a poison control center or emergency room at once. NOTE: This medicine is only for you. Do not share this medicine with others. What if I miss a dose? It is important not to miss your dose. Call your doctor or health care professional if you are unable to keep an appointment. What may interact with this medicine? -bromocriptine -cyclosporine -medicines for diabetes, including insulin -medicines for heart disease or hypertension -quinidine This list may not describe all possible interactions. Give your health care provider a list of all the medicines, herbs, non-prescription drugs, or dietary supplements you use. Also tell them if you smoke, drink alcohol, or use illegal drugs. Some items may interact with your medicine. What should I watch for while using this medicine? Visit your doctor or  health care professional for regular checks on your progress. Your condition will be monitored carefully while you are receiving this medicine. This medicine may cause increases or decreases in blood sugar. Signs of high blood sugar include frequent urination, unusual thirst, flushed or dry skin, difficulty breathing, drowsiness, stomach ache, nausea, vomiting or dry mouth. Signs of low blood sugar include chills, cool, pale skin or cold sweats, drowsiness, extreme hunger, fast heartbeat, headache, nausea, nervousness or anxiety, shakiness, trembling, unsteadiness, tiredness, or weakness. Contact your doctor or health care professional right away if you experience any of these symptoms. What side effects may I notice from receiving this medicine? Side effects that you should report to your doctor or health care professional as soon as possible: -allergic reactions like skin rash, itching or hives, swelling of the face, lips, or tongue -changes in blood sugar -changes in heart rate -severe stomach pain Side effects that usually do not require medical attention (report to your doctor or health care professional if they continue or are bothersome): -diarrhea or constipation -gas or stomach pain -nausea, vomiting -pain, redness, swelling and irritation at site where injected This list may not describe all possible side effects. Call your doctor for medical advice about side effects. You may report side effects to FDA at 1-800-FDA-1088. Where should I keep my medicine? This drug is given in a hospital or clinic and will not be stored at home. NOTE: This sheet is a summary. It may not cover all possible information. If you have questions about this medicine, talk to your doctor, pharmacist, or health care provider.    2016, Elsevier/Gold Standard. (2013-03-03 17:43:04)  

## 2015-06-22 NOTE — Telephone Encounter (Signed)
Critical Value Total Bilirubin 6.3 Dr Marin Olp notified. No orders at this time.

## 2015-06-23 LAB — LACTATE DEHYDROGENASE: LDH: 341 U/L — ABNORMAL HIGH (ref 125–245)

## 2015-06-23 MED FILL — *AFINITOR 5 MG TAB 4X7: 5 | 28 days supply | Qty: 28 | Fill #0

## 2015-06-26 ENCOUNTER — Ambulatory Visit (INDEPENDENT_AMBULATORY_CARE_PROVIDER_SITE_OTHER): Payer: Medicare Other | Admitting: Pharmacist

## 2015-06-26 DIAGNOSIS — I639 Cerebral infarction, unspecified: Secondary | ICD-10-CM

## 2015-06-26 DIAGNOSIS — Z7901 Long term (current) use of anticoagulants: Secondary | ICD-10-CM | POA: Diagnosis not present

## 2015-06-26 DIAGNOSIS — Z8673 Personal history of transient ischemic attack (TIA), and cerebral infarction without residual deficits: Secondary | ICD-10-CM

## 2015-06-26 DIAGNOSIS — I482 Chronic atrial fibrillation, unspecified: Secondary | ICD-10-CM

## 2015-06-26 LAB — POCT INR: INR: 5.8

## 2015-06-26 NOTE — Patient Instructions (Signed)
Patient instructed to take medications as defined in the Anti-coagulation Track section of this encounter.  Patient instructed to OMIT today's dose.  Patient verbalized understanding of these instructions.    

## 2015-06-26 NOTE — Progress Notes (Signed)
Anti-Coagulation Progress Note  Jonathan Reed is a 70 y.o. male who is currently on an anti-coagulation regimen.    RECENT RESULTS: Recent results are below, the most recent result is correlated with a dose of 22.5 mg. per week: Will OMIT today's dose; decrease total weekly dose to 17.5mg /wk (using 1/2 tablet of 5mg  strength warfarin tablets). Have reviewed Dr. Antonieta Pert notes. Hypoprothrombinemic response could be a component of this hepatic metastatic disease. Discussed with family at length this issue--the liver is where clotting factors are synthesized--if there is liver injury or damage based upon the pathology of his disease--he may not be synthesizing clotting factors to the extent he once was--accounting for the requirement of dose de-escalation. We also discussed that having oncologic disease puts him at increased risk for venous thromboembolism. Will OMIT today's dose; advised that he may eat something "dark-green and leafy" today as a means of vitamin K intake in his diet. Will see him in 1 week on this regimen.  Lab Results  Component Value Date   INR 5.80 06/26/2015   INR 2.1 06/19/2015   INR 1.37 06/14/2015   PROTIME 30.0* 05/15/2012    ANTI-COAG DOSE: Anticoagulation Dose Instructions as of 06/26/2015      Sun Mon Tue Wed Thu Fri Sat   New Dose 2.5 mg 2.5 mg 2.5 mg 2.5 mg 2.5 mg 2.5 mg 2.5 mg    Description        OMIT today's dose (Monday June 26, 2015).        ANTICOAG SUMMARY: Anticoagulation Episode Summary    Current INR goal 2.0-3.0  Next INR check 07/03/2015  INR from last check 5.80! (06/26/2015)  Weekly max dose   Target end date Indefinite  [facility]  Preferred lab   Send INR reminders to    Indications  Long term (current) use of anticoagulants [Z79.01] CVA (cerebral infarction) [I63.9] Chronic atrial fibrillation ([place]) [I48.2]        Comments         ANTICOAG TODAY: Anticoagulation Summary as of 06/26/2015    INR goal  2.0-3.0  Selected INR 5.80! (06/26/2015)  Next INR check 07/03/2015  Target end date Indefinite   Indications  Long term (current) use of anticoagulants [Z79.01] CVA (cerebral infarction) [I63.9] Chronic atrial fibrillation ([place]) [I48.2]      Anticoagulation Episode Summary    [facility]   Preferred lab    Send INR reminders to    Comments       PATIENT INSTRUCTIONS: Patient Instructions  Patient instructed to take medications as defined in the Anti-coagulation Track section of this encounter.  Patient instructed to OMIT today's dose.  Patient verbalized understanding of these instructions.       FOLLOW-UP Return in about 7 days (around 07/03/2015) for Follow up INR at 0900h.  [name], III Pharm.D., CACP

## 2015-06-27 ENCOUNTER — Encounter: Payer: Self-pay | Admitting: Hematology & Oncology

## 2015-06-27 ENCOUNTER — Ambulatory Visit (HOSPITAL_COMMUNITY)
Admission: RE | Admit: 2015-06-27 | Discharge: 2015-06-27 | Disposition: A | Discharge status: Home / Self Care | Payer: Medicare Other | Source: Ambulatory Visit | Attending: Hematology & Oncology | Admitting: Hematology & Oncology

## 2015-06-27 DIAGNOSIS — C787 Secondary malignant neoplasm of liver and intrahepatic bile duct: Secondary | ICD-10-CM | POA: Insufficient documentation

## 2015-06-27 DIAGNOSIS — I517 Cardiomegaly: Secondary | ICD-10-CM | POA: Diagnosis not present

## 2015-06-27 DIAGNOSIS — I340 Nonrheumatic mitral (valve) insufficiency: Secondary | ICD-10-CM | POA: Insufficient documentation

## 2015-06-27 DIAGNOSIS — I255 Ischemic cardiomyopathy: Secondary | ICD-10-CM | POA: Diagnosis not present

## 2015-06-27 DIAGNOSIS — I351 Nonrheumatic aortic (valve) insufficiency: Secondary | ICD-10-CM | POA: Insufficient documentation

## 2015-06-27 DIAGNOSIS — C7B8 Other secondary neuroendocrine tumors: Secondary | ICD-10-CM

## 2015-06-27 DIAGNOSIS — I071 Rheumatic tricuspid insufficiency: Secondary | ICD-10-CM | POA: Diagnosis not present

## 2015-06-27 DIAGNOSIS — Z7901 Long term (current) use of anticoagulants: Secondary | ICD-10-CM | POA: Insufficient documentation

## 2015-06-27 DIAGNOSIS — I429 Cardiomyopathy, unspecified: Secondary | ICD-10-CM | POA: Diagnosis present

## 2015-06-27 NOTE — Progress Notes (Signed)
Indication: Chronic atrial fibrillation. Duration: Indefinite. INR: Above target. Agree with Dr. Gladstone Pih assessment and plan.

## 2015-06-27 NOTE — Progress Notes (Signed)
  Echocardiogram 2D Echocardiogram has been performed.  Jonathan Reed 06/27/2015, 3:16 PM

## 2015-07-03 ENCOUNTER — Ambulatory Visit (INDEPENDENT_AMBULATORY_CARE_PROVIDER_SITE_OTHER): Payer: Medicare Other | Admitting: Pharmacist

## 2015-07-03 DIAGNOSIS — Z7901 Long term (current) use of anticoagulants: Secondary | ICD-10-CM | POA: Diagnosis not present

## 2015-07-03 DIAGNOSIS — I482 Chronic atrial fibrillation, unspecified: Secondary | ICD-10-CM

## 2015-07-03 DIAGNOSIS — Z8673 Personal history of transient ischemic attack (TIA), and cerebral infarction without residual deficits: Secondary | ICD-10-CM

## 2015-07-03 DIAGNOSIS — I639 Cerebral infarction, unspecified: Secondary | ICD-10-CM

## 2015-07-03 LAB — POCT INR: INR: >8

## 2015-07-03 LAB — PROTIME-INR
INR: 6.93 — AB (ref 0.00–1.49)
PROTHROMBIN TIME: 57.4 s — AB (ref 11.6–15.2)

## 2015-07-03 NOTE — Progress Notes (Signed)
Indication: Chronic atrial fibrillation. Duration: Indefinite. INR: Above target. Agree with Dr. Gladstone Pih assessment and plan.

## 2015-07-03 NOTE — Patient Instructions (Signed)
Patient instructed to take medications as defined in the Anti-coagulation Track section of this encounter.  Patient instructed to OMIT today's dose. ; OMIT tomorrow's dose of warfarin as well.  Patient instructed to TAKE his Phytonadione Vitamin K1 HALF TAB (5mg /1 tab = 2.5mg /0.5 tablet; Lot number [id number] Exp: 02/27/16 MFG: [employer]).  Patient verbalized understanding of these instructions.

## 2015-07-03 NOTE — Progress Notes (Signed)
Anti-Coagulation Progress Note  Jonathan Reed is a 69 y.o. male who is currently on an anti-coagulation regimen.    RECENT RESULTS: Recent results are below, the most recent result is correlated with a dose of 15 mg. per week (he was instructed to OMIT LAST Monday's dose on 10-APR-17): Today, FS POC INR revealed >8.0 As per clinic policy, a venous drawn sample was collected and sent upstairs to main lab for STAT processing. Results were phoned to me at approximately 1115h today revealing 6.93 critical high value. Patient had been provided before his discharge--a 2.5mg  vitamin K1/phytonadione/MEPHYTON tablet (MFG: [employer]; LOT NUMBER [id number] exp: 02/27/16) tablet (from a 5mg /1 tablet dose was provided as 2.5mg /0.5 tablet). Patient's daughter, Jonathan Reed was called at 1120h and instructed to give her father this 1/2 tablet (2.5mg  dose) of vitamin K1 and OMIT today and tomorrows dose of warfarin and RTC on Wednesday 19-APR-17 at 0830h for repeat INR. I suspect with his liver involvement (oncologic disease) as well as review of his most recent LFTs we are seeing demonstration of the impact upon the liver's functional capacity to synthesize clotting factors which have accounted for this hypoprothrombinemic response. Will re-evaluate on Wednesday 19-APR-17 at [facility]. Patient/daughter reminded--in the event of any signs/symptoms of bleeding--blood in urine, stool, nose-bleeds, throwing up blood, coughing up blood, increased bruising, abdominal pain, severe headache, etc. To call me/clinic or go to the emergency department.  Lab Results  Component Value Date   INR >8.0 07/03/2015   INR 6.93* 07/03/2015   INR 5.80 06/26/2015   PROTIME 30.0* 05/15/2012    ANTI-COAG DOSE: Anticoagulation Dose Instructions as of 07/03/2015      [name] Tue Wed Thu Fri Sat   New Dose 2.5 mg Hold Hold Hold 2.5 mg 2.5 mg 2.5 mg    Description        Venous drawn specimen (for FS POC result >8.0) was returned as a critical high  value to day at approximately 1115h. Lab provided VENOUS SAMPLE result = 6.93. Patient has been provided vitamin K1 (phytonadione/MEPHYTON) 5mg /tab 2.5mg /0.5 half-tablet/Lot #[id number] Exp: 02/27/16 as DOSE for today. Patient was provided this before discharge. Daughter Jonathan Reed at [phone] has been called and advised to instruct her father to take the HALF-TABLET he was provided. Patient instructed to OMIT today's dose and to OMIT tomorrow's dose. RTC on Wednesday 19-APR-17 at 0830h for repeat INR.        ANTICOAG SUMMARY: Anticoagulation Episode Summary    Current INR goal 2.0-3.0  Next INR check 07/05/2015  INR from last check >8.0 (07/03/2015)  The INR is not numeric and cannot be flagged as normal/abnormal.  Weekly max dose   Target end date Indefinite  [facility]  Preferred lab   Send INR reminders to    Indications  Long term (current) use of anticoagulants [Z79.01] CVA (cerebral infarction) [I63.9] Chronic atrial fibrillation ([place]) [I48.2]        Comments         ANTICOAG TODAY: Anticoagulation Summary as of 07/03/2015    INR goal 2.0-3.0  Selected INR >8.0 (07/03/2015)  The INR is not numeric and cannot be flagged as normal/abnormal.  Next INR check 07/05/2015  Target end date Indefinite   Indications  Long term (current) use of anticoagulants [Z79.01] CVA (cerebral infarction) [I63.9] Chronic atrial fibrillation ([place]) [I48.2]      Anticoagulation Episode Summary    [facility]   Preferred lab    Send INR reminders  to    Comments       PATIENT INSTRUCTIONS: Patient Instructions  Patient instructed to take medications as defined in the Anti-coagulation Track section of this encounter.  Patient instructed to OMIT today's dose. ; OMIT tomorrow's dose of warfarin as well.  Patient instructed to TAKE his Phytonadione Vitamin K1 HALF TAB (5mg /1 tab = 2.5mg /0.5 tablet; Lot number [id number] Exp: 02/27/16 MFG: [employer]).   Patient verbalized understanding of these instructions.       FOLLOW-UP Return in 2 days (on 07/05/2015), or Follow up INR at 0830h.  [name], III Pharm.D., CACP

## 2015-07-05 ENCOUNTER — Ambulatory Visit (INDEPENDENT_AMBULATORY_CARE_PROVIDER_SITE_OTHER): Payer: Medicare Other | Admitting: Pharmacist

## 2015-07-05 DIAGNOSIS — I482 Chronic atrial fibrillation, unspecified: Secondary | ICD-10-CM

## 2015-07-05 DIAGNOSIS — I639 Cerebral infarction, unspecified: Secondary | ICD-10-CM

## 2015-07-05 DIAGNOSIS — Z8673 Personal history of transient ischemic attack (TIA), and cerebral infarction without residual deficits: Secondary | ICD-10-CM

## 2015-07-05 DIAGNOSIS — Z7901 Long term (current) use of anticoagulants: Secondary | ICD-10-CM | POA: Diagnosis not present

## 2015-07-05 LAB — POCT INR: INR: 1.9

## 2015-07-05 NOTE — Progress Notes (Signed)
Anti-Coagulation Progress Note  Jonathan Reed is a 70 y.o. male who is currently on an anti-coagulation regimen.    RECENT RESULTS: Recent results are below, the most recent result is correlated with a dose of having omitted past 2 days dosing and administration of 2.5mg  Vitamin K1 PO. No signs or symptoms of bleeding since that time.  Lab Results  Component Value Date   INR 1.90 07/05/2015   INR >8.0 07/03/2015   INR 6.93* 07/03/2015   PROTIME 30.0* 05/15/2012    ANTI-COAG DOSE: Anticoagulation Dose Instructions as of 07/05/2015      [name] Tue Wed Thu Fri Sat   New Dose 2.5 mg 0 mg 0 mg 5 mg 5 mg 2.5 mg 2.5 mg    Description        Will re-commence status-post administration of 2.5mg  vitamin K1 PO on Monday 17-APR-17 and 2 omitted doses.        ANTICOAG SUMMARY: Anticoagulation Episode Summary    Current INR goal 2.0-3.0  Next INR check 07/10/2015  INR from last check 1.90! (07/05/2015)  Weekly max dose   Target end date Indefinite  [facility]  Preferred lab   Send INR reminders to    Indications  Long term (current) use of anticoagulants [Z79.01] CVA (cerebral infarction) [I63.9] Chronic atrial fibrillation ([place]) [I48.2]        Comments         ANTICOAG TODAY: Anticoagulation Summary as of 07/05/2015    INR goal 2.0-3.0  Selected INR 1.90! (07/05/2015)  Next INR check 07/10/2015  Target end date Indefinite   Indications  Long term (current) use of anticoagulants [Z79.01] CVA (cerebral infarction) [I63.9] Chronic atrial fibrillation ([place]) [I48.2]      Anticoagulation Episode Summary    [facility]   Preferred lab    Send INR reminders to    Comments       PATIENT INSTRUCTIONS: Patient Instructions  Patient instructed to take medications as defined in the Anti-coagulation Track section of this encounter.  Patient instructed to take today's dose.  Patient verbalized understanding of these instructions.        FOLLOW-UP Return in 5 days (on 07/10/2015) for Follow up INR at 0845h.  [name], III Pharm.D., CACP

## 2015-07-05 NOTE — Patient Instructions (Signed)
Patient instructed to take medications as defined in the Anti-coagulation Track section of this encounter.  Patient instructed to take today's dose.  Patient verbalized understanding of these instructions.    

## 2015-07-05 NOTE — Progress Notes (Signed)
Reviewed Thanks drG 

## 2015-07-06 ENCOUNTER — Other Ambulatory Visit: Payer: Self-pay | Admitting: *Deleted

## 2015-07-06 ENCOUNTER — Other Ambulatory Visit (HOSPITAL_BASED_OUTPATIENT_CLINIC_OR_DEPARTMENT_OTHER): Payer: Medicare Other

## 2015-07-06 ENCOUNTER — Ambulatory Visit (HOSPITAL_BASED_OUTPATIENT_CLINIC_OR_DEPARTMENT_OTHER): Payer: Medicare Other | Admitting: Hematology & Oncology

## 2015-07-06 VITALS — BP 124/74 | HR 79 | Temp 98.0°F | Resp 18 | Wt 183.0 lb

## 2015-07-06 DIAGNOSIS — R160 Hepatomegaly, not elsewhere classified: Secondary | ICD-10-CM | POA: Diagnosis not present

## 2015-07-06 DIAGNOSIS — C7B8 Other secondary neuroendocrine tumors: Secondary | ICD-10-CM | POA: Diagnosis not present

## 2015-07-06 DIAGNOSIS — R188 Other ascites: Secondary | ICD-10-CM

## 2015-07-06 DIAGNOSIS — R609 Edema, unspecified: Secondary | ICD-10-CM

## 2015-07-06 DIAGNOSIS — K591 Functional diarrhea: Secondary | ICD-10-CM

## 2015-07-06 DIAGNOSIS — C7A8 Other malignant neuroendocrine tumors: Secondary | ICD-10-CM

## 2015-07-06 DIAGNOSIS — R197 Diarrhea, unspecified: Secondary | ICD-10-CM | POA: Diagnosis not present

## 2015-07-06 DIAGNOSIS — I255 Ischemic cardiomyopathy: Secondary | ICD-10-CM

## 2015-07-06 LAB — COMPREHENSIVE METABOLIC PANEL
ALBUMIN: 2 g/dL — AB (ref 3.5–5.0)
ALK PHOS: 345 U/L — AB (ref 40–150)
ALT: 18 U/L (ref 0–55)
AST: 49 U/L — AB (ref 5–34)
Anion Gap: 10 mEq/L (ref 3–11)
BUN: 12.4 mg/dL (ref 7.0–26.0)
CALCIUM: 8.1 mg/dL — AB (ref 8.4–10.4)
CO2: 21 mEq/L — ABNORMAL LOW (ref 22–29)
CREATININE: 0.8 mg/dL (ref 0.7–1.3)
Chloride: 111 mEq/L — ABNORMAL HIGH (ref 98–109)
EGFR: >90 mL/min/{1.73_m2} (ref >90)
Glucose: 131 mg/dl (ref 70–140)
Potassium: 4.1 mEq/L (ref 3.5–5.1)
Sodium: 142 mEq/L (ref 136–145)
Total Bilirubin: 4.54 mg/dL (ref 0.20–1.20)
Total Protein: 5.7 g/dL — ABNORMAL LOW (ref 6.4–8.3)

## 2015-07-06 LAB — CBC WITH DIFFERENTIAL (CANCER CENTER ONLY)
BASO#: 0 10*3/uL (ref 0.0–0.2)
BASO%: 0.5 % (ref 0.0–2.0)
EOS%: 2.2 % (ref 0.0–7.0)
Eosinophils Absolute: 0.1 10*3/uL (ref 0.0–0.5)
HEMATOCRIT: 37.9 % — AB (ref 38.7–49.9)
HEMOGLOBIN: 13.5 g/dL (ref 13.0–17.1)
LYMPH#: 0.9 10*3/uL (ref 0.9–3.3)
LYMPH%: 21.6 % (ref 14.0–48.0)
MCH: 32 pg (ref 28.0–33.4)
MCHC: 35.6 g/dL (ref 32.0–35.9)
MCV: 90 fL (ref 82–98)
MONO#: 0.4 10*3/uL (ref 0.1–0.9)
MONO%: 8.5 % (ref 0.0–13.0)
NEUT%: 67.2 % (ref 40.0–80.0)
NEUTROS ABS: 2.8 10*3/uL (ref 1.5–6.5)
Platelets: 40 10*3/uL — ABNORMAL LOW (ref 145–400)
RBC: 4.22 10*6/uL (ref 4.20–5.70)
RDW: 22.2 % — ABNORMAL HIGH (ref 11.1–15.7)
WBC: 4.1 10*3/uL (ref 4.0–10.0)

## 2015-07-06 MED ORDER — FUROSEMIDE 20 MG PO TABS: 40.0000 mg | ORAL_TABLET | Freq: Every day | ORAL | Status: DC

## 2015-07-06 MED ORDER — DIPHENOXYLATE-ATROPINE 2.5-0.025 MG PO TABS: 1.0000 | ORAL_TABLET | Freq: Four times a day (QID) | ORAL | Status: AC | PRN

## 2015-07-06 NOTE — Progress Notes (Signed)
Hematology and Oncology Follow Up Visit  DOCTOR COBAS GO:[id number] 03-11-1946 70 y.o. 07/06/2015   Principle Diagnosis:   Low-grade Neuroendocrine carcinoma with liver metastasis  Current Therapy:    Somatuline 120 mg subcutaneous every month  Afinitor 5mg  po q day     Interim History:  Jonathan Reed is back for follow-up.he is worried about losing muscle mass. He just is not eating much.  When I saw him last, I got an echocardiogram on him. This showed severe left ventricular hypertrophy. He had an ejection fraction of 40%. This appeared to be similar to what it was before.  I suspect that his hepatomegaly might be more related to diastolic dysfunction.  He has more leg swelling. Has more abdominal swelling.  He now is on Afinitor. He sees be doing okay with this. He's had no sores. He's had no nausea vomiting.   He says that he is having some diarrhea. It is hard to say how much diarrhea he is having. His wife says she gives him some Imodium which helps. She is afraid of giving him too much.   I do not think that the diarrhea is from the neuroendocrine tumor. However, this is a possibility. I know that the FDA just approved new medication for diarrhea and situation like this. I think I just want to hold off on giving him more medication.   I did speak with his cardiologist at [facility]. His cardiologist thought that he was on diuretics. He is not on any diuretic. We will start him on 40 mg a day of Lasix. His cardiologist will see him to try to help [place] out with his cardiac issues.   I think he probably is going to need a paracentesis. This may be affecting his appetite.  Currently, his performance status is ECOG 2.   Medications:  Current outpatient prescriptions:  .  diphenoxylate-atropine (LOMOTIL) 2.5-0.025 MG tablet, Take 1 tablet by mouth 4 (four) times daily as needed for diarrhea or loose stools., Disp: 60 tablet, Rfl: 0 .  everolimus (AFINITOR) 5 MG tablet, Take 1  tablet (5 mg total) by mouth daily., Disp: 30 tablet, Rfl: 2 .  furosemide (LASIX) 20 MG tablet, Take 2 tablets (40 mg total) by mouth daily., Disp: 30 tablet, Rfl: 5 .  guaiFENesin-dextromethorphan (ROBITUSSIN DM) 100-10 MG/5ML syrup, Take 5 mLs by mouth every 4 (four) hours as needed for cough., Disp: 118 mL, Rfl: 0 .  lisinopril (PRINIVIL,ZESTRIL) 20 MG tablet, Take 1 tablet (20 mg total) by mouth daily., Disp: 30 tablet, Rfl: 11 .  megestrol (MEGACE) 40 MG/ML suspension, Take 10 mLs (400 mg total) by mouth 2 (two) times daily., Disp: 480 mL, Rfl: 3 .  metoprolol succinate (TOPROL-XL) 50 MG 24 hr tablet, Take 1 tablet (50 mg total) by mouth daily. Take with or immediately following a meal. (Patient taking differently: Take 100 mg by mouth daily. Take with or immediately following a meal.), Disp: 30 tablet, Rfl: 2 .  naproxen sodium (ANAPROX) 220 MG tablet, Take 220 mg by mouth 2 (two) times daily as needed (pain). Reported on 04/27/2015, Disp: , Rfl:  .  warfarin (COUMADIN) 5 MG tablet, Take 1/2 tablet on Mondays/Thursdays; take 1 tablet all other days., Disp: 30 tablet, Rfl: 2  Allergies:  No Active Allergies  Past Medical History, Surgical history, Social history, and Family History were reviewed and updated.  Review of Systems: As above  Physical Exam:  weight is 183 lb (83.008 kg). His oral temperature  is 98 F (36.7 C). His blood pressure is 124/74 and his pulse is 79. His respiration is 18.   Wt Readings from Last 3 Encounters:  07/06/15 183 lb (83.008 kg)  06/22/15 184 lb (83.462 kg)  06/08/15 179 lb (81.194 kg)     Thin African-American male. Head and neck exam shows no ocular or oral lesions. He has no palpable cervical or supraclavicular lymph nodes. His pacemaker is in the upper part of the left anterior chest wall. Lungs are clear. Cardiac exam irregular rate and rhythm consistent with atrial fibrillation. He has 1/6 systolic ejection murmur. Abdomen is soft. He has good  bowel sounds. Has marked hepatomegaly. His liver edge is probably 6 mL below the right costal margin. His liver extends across the midline. I cannot palpate his spleen below the left costal margin.. I think he may have some ascites. Extremity shows no clubbing, cyanosis or edema. There may be a little bit of nonpitting edema of the right lower leg. No venous cord is noted in the legs. He has a negative Homans sign. His good range motion of his joints. Skin exam shows no rashes. Neurological exam is nonfocal.  Lab Results  Component Value Date   WBC 4.1 07/06/2015   HGB 13.5 07/06/2015   HCT 37.9* 07/06/2015   MCV 90 07/06/2015   PLT 40* 07/06/2015     Chemistry      Component Value Date/Time   NA 142 07/06/2015 1354   NA 139 06/22/2015 1505   NA 140 05/24/2015 1011   NA 142 03/30/2015 1100   K 4.1 07/06/2015 1354   K 4.0 06/22/2015 1505   K 4.2 05/24/2015 1011   CL 109* 06/22/2015 1505   CL 103 05/24/2015 1011   CL 105 08/24/2012 0849   CO2 21* 07/06/2015 1354   CO2 24 06/22/2015 1505   CO2 23 05/24/2015 1011   BUN 12.4 07/06/2015 1354   BUN 14 06/22/2015 1505   BUN 15 05/24/2015 1011   BUN 16 03/30/2015 1100   CREATININE 0.8 07/06/2015 1354   CREATININE 1.0 06/22/2015 1505   CREATININE 1.15 05/24/2015 1011      Component Value Date/Time   CALCIUM 8.1* 07/06/2015 1354   CALCIUM 9.1 06/22/2015 1505   CALCIUM 8.7 05/24/2015 1011   ALKPHOS 345* 07/06/2015 1354   ALKPHOS 316* 06/22/2015 1505   ALKPHOS 489* 05/24/2015 1011   AST 49* 07/06/2015 1354   AST 57* 06/22/2015 1505   AST 73* 05/24/2015 1011   ALT 18 07/06/2015 1354   ALT 35 06/22/2015 1505   ALT 41 05/24/2015 1011   BILITOT 4.54* 07/06/2015 1354   BILITOT 6.30* 06/22/2015 1505   BILITOT 3.7* 05/24/2015 1011   BILITOT 1.5* 03/30/2015 1100         Impression and Plan: Jonathan Reed is a 70 year old Afro-American male. He has a well-differentiated neuroendocrine tumor. He has liver metastasis.  I still have  hard time believing that his hepatomegaly is from his cancer. To me, his liver edge does not appear to be as prominent this time.   I'm glad that his liver function tests seem to be a little bit better.  I want to see with some Lomotil might do for his diarrhea.  We'll get a paracentesis done tomorrow.  Hopefully, we will see that his labs look better. If so, then I might be able to increase his dose of Afinitor.  I spent about 40 minutes with he and his family. It is always  nice to be able to talk with them.

## 2015-07-07 ENCOUNTER — Ambulatory Visit (HOSPITAL_COMMUNITY)
Admission: RE | Admit: 2015-07-07 | Discharge: 2015-07-07 | Disposition: A | Discharge status: Home / Self Care | Payer: Medicare Other | Source: Ambulatory Visit | Attending: Hematology & Oncology | Admitting: Hematology & Oncology

## 2015-07-07 ENCOUNTER — Other Ambulatory Visit: Payer: Self-pay | Admitting: *Deleted

## 2015-07-07 ENCOUNTER — Telehealth: Payer: Self-pay | Admitting: *Deleted

## 2015-07-07 DIAGNOSIS — K591 Functional diarrhea: Secondary | ICD-10-CM | POA: Diagnosis present

## 2015-07-07 DIAGNOSIS — C7B8 Other secondary neuroendocrine tumors: Secondary | ICD-10-CM | POA: Insufficient documentation

## 2015-07-07 DIAGNOSIS — R188 Other ascites: Secondary | ICD-10-CM

## 2015-07-07 DIAGNOSIS — R180 Malignant ascites: Secondary | ICD-10-CM | POA: Insufficient documentation

## 2015-07-07 MED ORDER — FUROSEMIDE 20 MG PO TABS: 40.0000 mg | ORAL_TABLET | Freq: Every day | ORAL | Status: AC

## 2015-07-07 NOTE — Procedures (Signed)
Ultrasound-guided diagnostic and therapeutic paracentesis performed yielding 3 liters of slightly hazy, yellow fluid. No immediate complications. A portion of the fluid was sent to the lab for cytology.

## 2015-07-07 NOTE — Telephone Encounter (Signed)
Received notice from [facility] lab that patient bilirubin was 4.54 .  Dr. Marin Olp notified

## 2015-07-10 ENCOUNTER — Ambulatory Visit (INDEPENDENT_AMBULATORY_CARE_PROVIDER_SITE_OTHER): Payer: Medicare Other | Admitting: Pharmacist

## 2015-07-10 DIAGNOSIS — Z7901 Long term (current) use of anticoagulants: Secondary | ICD-10-CM

## 2015-07-10 DIAGNOSIS — Z8673 Personal history of transient ischemic attack (TIA), and cerebral infarction without residual deficits: Secondary | ICD-10-CM | POA: Diagnosis not present

## 2015-07-10 DIAGNOSIS — I482 Chronic atrial fibrillation, unspecified: Secondary | ICD-10-CM

## 2015-07-10 DIAGNOSIS — I639 Cerebral infarction, unspecified: Secondary | ICD-10-CM

## 2015-07-10 LAB — POCT INR: INR: 3.1

## 2015-07-10 NOTE — Patient Instructions (Signed)
Patient instructed to take medications as defined in the Anti-coagulation Track section of this encounter.  Patient instructed to OMIT today's dose.  Patient verbalized understanding of these instructions.    

## 2015-07-10 NOTE — Progress Notes (Signed)
Anti-Coagulation Progress Note  Jonathan Reed is a 70 y.o. male who is currently on an anti-coagulation regimen.    RECENT RESULTS: Recent results are below, the most recent result is correlated with a dose of 17.5 mg over past 5 days (weighted daily average dose 3.5mg /day). Lab Results  Component Value Date   INR 3.1 07/10/2015   INR 1.90 07/05/2015   INR >8.0 07/03/2015   PROTIME 30.0* 05/15/2012    ANTI-COAG DOSE: Anticoagulation Dose Instructions as of 07/10/2015      [name] Tue Wed Thu Fri Sat   New Dose 2.5 mg 0 mg 2.5 mg 2.5 mg 2.5 mg 2.5 mg 2.5 mg       ANTICOAG SUMMARY: Anticoagulation Episode Summary    Current INR goal 2.0-3.0  Next INR check 07/17/2015  INR from last check 3.1! (07/10/2015)  Weekly max dose   Target end date Indefinite  [facility]  Preferred lab   Send INR reminders to    Indications  Long term (current) use of anticoagulants [Z79.01] CVA (cerebral infarction) [I63.9] Chronic atrial fibrillation ([place]) [I48.2]        Comments         ANTICOAG TODAY: Anticoagulation Summary as of 07/10/2015    INR goal 2.0-3.0  Selected INR 3.1! (07/10/2015)  Next INR check 07/17/2015  Target end date Indefinite   Indications  Long term (current) use of anticoagulants [Z79.01] CVA (cerebral infarction) [I63.9] Chronic atrial fibrillation ([place]) [I48.2]      Anticoagulation Episode Summary    [facility]   Preferred lab    Send INR reminders to    Comments       PATIENT INSTRUCTIONS: Patient Instructions  Patient instructed to take medications as defined in the Anti-coagulation Track section of this encounter.  Patient instructed to OMIT today's dose.  Patient verbalized understanding of these instructions.       FOLLOW-UP Return in 7 days (on 07/17/2015) for Follow up INR at 0900h.  [name], III Pharm.D., CACP

## 2015-07-10 NOTE — Progress Notes (Signed)
Indication: Chronic atrial fibrillation. Duration: Indefinite. INR: Above target. Agree with Dr. Gladstone Pih assessment and plan.

## 2015-07-14 ENCOUNTER — Other Ambulatory Visit: Payer: Self-pay | Admitting: Internal Medicine

## 2015-07-14 ENCOUNTER — Other Ambulatory Visit: Payer: Self-pay | Admitting: Nurse Practitioner

## 2015-07-14 ENCOUNTER — Encounter: Payer: Self-pay | Admitting: Nurse Practitioner

## 2015-07-14 DIAGNOSIS — M109 Gout, unspecified: Secondary | ICD-10-CM

## 2015-07-14 MED ORDER — COLCHICINE 0.6 MG PO TABS: 0.6000 mg | ORAL_TABLET | ORAL | Status: AC | PRN

## 2015-07-14 NOTE — Telephone Encounter (Signed)
Last seen 05/24/15

## 2015-07-14 NOTE — Telephone Encounter (Signed)
Patient's wife called stating they needed a refill on his gout medication. I advised her that she really needed to follow up with her PCP in order to get this refilled as Dr. Marin Olp is not the original prescriber and also is not the physician following the patient for his flare-ups. Wife upset and stated Dr. Marin Olp is their PCP and Oncologist. Explained to her the difference between a PCP and Oncologist being a specialty area. Patient insisted that they are no longer seeing their PCP because they had him (Dr. Marin Olp) to assist with care. I explained that Dr. Marin Olp would be willing to assist in anyway that he can regarding their cancer care however, they would need to re-establish primary care with another physician. I spoke to Dr. Marin Olp about the situation and he agreed. He agreed to provide the patient with 10 tablets allowing them time to re-establish care. I have attempted to call the wife back several times with no luck. It sounds as though they are having phone complications. A clicking noise is heard and then the phone proceeds to disconnect. I will send the patient a MyChart message to confirm.

## 2015-07-17 ENCOUNTER — Ambulatory Visit (INDEPENDENT_AMBULATORY_CARE_PROVIDER_SITE_OTHER): Payer: Medicare Other | Admitting: Pharmacist

## 2015-07-17 DIAGNOSIS — I482 Chronic atrial fibrillation, unspecified: Secondary | ICD-10-CM

## 2015-07-17 DIAGNOSIS — Z7901 Long term (current) use of anticoagulants: Secondary | ICD-10-CM | POA: Diagnosis not present

## 2015-07-17 DIAGNOSIS — Z8673 Personal history of transient ischemic attack (TIA), and cerebral infarction without residual deficits: Secondary | ICD-10-CM | POA: Diagnosis not present

## 2015-07-17 DIAGNOSIS — I639 Cerebral infarction, unspecified: Secondary | ICD-10-CM

## 2015-07-17 LAB — POCT INR: INR: 4

## 2015-07-17 NOTE — Patient Instructions (Signed)
Patient instructed to take medications as defined in the Anti-coagulation Track section of this encounter.  Patient instructed to OMIT doses on Mondays/Wednesdays/Fridays; take 1/2 tablet of your 5mg  strength warfarin tablet (dose = 2.5mg ) on all other days.   Patient verbalized understanding of these instructions.

## 2015-07-17 NOTE — Progress Notes (Signed)
INTERNAL MEDICINE TEACHING ATTENDING ADDENDUM - Jameica Couts M.D  Duration- indefinite, Indication- afib, INR- supratherapeutic. Agree with pharmacy recommendations as outlined in their note.     

## 2015-07-17 NOTE — Progress Notes (Signed)
Anti-Coagulation Progress Note  Jonathan Reed is a 70 y.o. male who is currently on an anti-coagulation regimen.    RECENT RESULTS: Recent results are below, the most recent result is correlated with a dose of 15 mg. per week: Lab Results  Component Value Date   INR 4.0 07/17/2015   INR 3.1 07/10/2015   INR 1.90 07/05/2015   PROTIME 30.0* 05/15/2012    ANTI-COAG DOSE: Anticoagulation Dose Instructions as of 07/17/2015      Sun Mon Tue Wed Thu Fri Sat   New Dose 2.5 mg 0 mg 2.5 mg 0 mg 2.5 mg 0 mg 2.5 mg       ANTICOAG SUMMARY: Anticoagulation Episode Summary    Current INR goal 2.0-3.0  Next INR check 07/24/2015  INR from last check 4.0! (07/17/2015)  Weekly max dose   Target end date Indefinite  [facility]  Preferred lab   Send INR reminders to    Indications  Long term (current) use of anticoagulants [Z79.01] CVA (cerebral infarction) [I63.9] Chronic atrial fibrillation ([place]) [I48.2]        Comments         ANTICOAG TODAY: Anticoagulation Summary as of 07/17/2015    INR goal 2.0-3.0  Selected INR 4.0! (07/17/2015)  Next INR check 07/24/2015  Target end date Indefinite   Indications  Long term (current) use of anticoagulants [Z79.01] CVA (cerebral infarction) [I63.9] Chronic atrial fibrillation ([place]) [I48.2]      Anticoagulation Episode Summary    [facility]   Preferred lab    Send INR reminders to    Comments       PATIENT INSTRUCTIONS: Patient Instructions  Patient instructed to take medications as defined in the Anti-coagulation Track section of this encounter.  Patient instructed to OMIT doses on Mondays/Wednesdays/Fridays; take 1/2 tablet of your 5mg  strength warfarin tablet (dose = 2.5mg ) on all other days.   Patient verbalized understanding of these instructions.       FOLLOW-UP Return in about 7 days (around 07/24/2015) for Follow up INR at 0915h.  [name], III Pharm.D., CACP

## 2015-07-20 ENCOUNTER — Other Ambulatory Visit (HOSPITAL_BASED_OUTPATIENT_CLINIC_OR_DEPARTMENT_OTHER): Payer: Medicare Other

## 2015-07-20 ENCOUNTER — Ambulatory Visit (HOSPITAL_BASED_OUTPATIENT_CLINIC_OR_DEPARTMENT_OTHER): Payer: Medicare Other | Admitting: Hematology & Oncology

## 2015-07-20 ENCOUNTER — Telehealth: Payer: Self-pay | Admitting: *Deleted

## 2015-07-20 ENCOUNTER — Other Ambulatory Visit: Payer: Self-pay | Admitting: *Deleted

## 2015-07-20 ENCOUNTER — Encounter: Payer: Self-pay | Admitting: Hematology & Oncology

## 2015-07-20 VITALS — BP 90/62 | HR 61 | Temp 97.8°F | Resp 16 | Ht 71.0 in | Wt 155.0 lb

## 2015-07-20 DIAGNOSIS — C7B8 Other secondary neuroendocrine tumors: Secondary | ICD-10-CM

## 2015-07-20 DIAGNOSIS — C7A8 Other malignant neuroendocrine tumors: Secondary | ICD-10-CM

## 2015-07-20 DIAGNOSIS — K591 Functional diarrhea: Secondary | ICD-10-CM

## 2015-07-20 DIAGNOSIS — R630 Anorexia: Secondary | ICD-10-CM | POA: Diagnosis not present

## 2015-07-20 DIAGNOSIS — R160 Hepatomegaly, not elsewhere classified: Secondary | ICD-10-CM

## 2015-07-20 DIAGNOSIS — R188 Other ascites: Secondary | ICD-10-CM

## 2015-07-20 DIAGNOSIS — I255 Ischemic cardiomyopathy: Secondary | ICD-10-CM

## 2015-07-20 LAB — CBC WITH DIFFERENTIAL (CANCER CENTER ONLY)
BASO#: 0 10*3/uL (ref 0.0–0.2)
BASO%: 0.7 % (ref 0.0–2.0)
EOS ABS: 0.1 10*3/uL (ref 0.0–0.5)
EOS%: 3.8 % (ref 0.0–7.0)
HCT: 42.3 % (ref 38.7–49.9)
HGB: 14.9 g/dL (ref 13.0–17.1)
LYMPH#: 1 10*3/uL (ref 0.9–3.3)
LYMPH%: 32.8 % (ref 14.0–48.0)
MCH: 31.3 pg (ref 28.0–33.4)
MCHC: 35.2 g/dL (ref 32.0–35.9)
MCV: 89 fL (ref 82–98)
MONO#: 0.4 10*3/uL (ref 0.1–0.9)
MONO%: 13.7 % — AB (ref 0.0–13.0)
NEUT#: 1.4 10*3/uL — ABNORMAL LOW (ref 1.5–6.5)
NEUT%: 49 % (ref 40.0–80.0)
PLATELETS: 103 10*3/uL — AB (ref 145–400)
RBC: 4.76 10*6/uL (ref 4.20–5.70)
RDW: 19.3 % — ABNORMAL HIGH (ref 11.1–15.7)
WBC: 2.9 10*3/uL — AB (ref 4.0–10.0)

## 2015-07-20 LAB — COMPREHENSIVE METABOLIC PANEL
ALK PHOS: 390 U/L — AB (ref 40–150)
ALT: 21 U/L (ref 0–55)
AST: 45 U/L — AB (ref 5–34)
Albumin: 2.3 g/dL — ABNORMAL LOW (ref 3.5–5.0)
Anion Gap: 10 mEq/L (ref 3–11)
BUN: 11.7 mg/dL (ref 7.0–26.0)
CO2: 27 meq/L (ref 22–29)
CREATININE: 1.1 mg/dL (ref 0.7–1.3)
Calcium: 8.7 mg/dL (ref 8.4–10.4)
Chloride: 105 mEq/L (ref 98–109)
EGFR: 83 mL/min/{1.73_m2} — AB (ref >90)
Glucose: 104 mg/dl (ref 70–140)
Potassium: 3.5 mEq/L (ref 3.5–5.1)
SODIUM: 142 meq/L (ref 136–145)
TOTAL PROTEIN: 6.4 g/dL (ref 6.4–8.3)

## 2015-07-20 MED ORDER — EVEROLIMUS 5 MG PO TABS: 5.0000 mg | ORAL_TABLET | Freq: Every day | ORAL | Status: DC

## 2015-07-20 NOTE — Telephone Encounter (Signed)
Critical Value Total Bili 4.19 Dr Marin Olp notified. No orders at this time

## 2015-07-21 LAB — PREALBUMIN: PREALBUMIN: 19 mg/dL (ref 10–36)

## 2015-07-21 LAB — CHROMOGRANIN A: Chromogranin A: 2 nmol/L (ref 0–5)

## 2015-07-21 NOTE — Progress Notes (Signed)
Hematology and Oncology Follow Up Visit  Jonathan Reed [id number] June 02, 1945 70 y.o. 07/21/2015   Principle Diagnosis:   Low-grade Neuroendocrine carcinoma with liver metastasis  Current Therapy:    Somatuline 120 mg subcutaneous every month  Afinitor 5mg  po q day     Interim History:  Jonathan Reed is back for follow-up.he is worried about losing muscle mass. He just is not eating much.  When I saw him last, I got an echocardiogram on him. This showed severe left ventricular hypertrophy. He had an ejection fraction of 40%. This appeared to be similar to what it was before.  I suspect that his hepatomegaly might be more related to diastolic dysfunction.  He has more leg swelling. Has more abdominal swelling.  He has been on Afinitor for probably 3 weeks.Marland Kitchen He seems to be doing okay with this. He's had no sores. He's had no nausea or vomiting.   He says that he is not having any diarrhea now. I put him on some Lomotil.  Again the big problems that he just is not eating much. He is not taking the appetite medicines that we gave him. He is on Marinol and Megace elixir. I keep trying to get him to eat more.   He has had no bleeding.   He did have a paracentesis done last week. He had about 3 L of fluid taken off. The fluid was still negative for any malignancy.   Currently, his performance status is ECOG 2.   Medications:  Current outpatient prescriptions:  .  colchicine (COLCRYS) 0.6 MG tablet, Take 1 tablet (0.6 mg total) by mouth as needed (For acute gout flare up.). Take 2 tabs (1.2 mg) then 1 tab (0.6 mg) one hour later., Disp: 15 tablet, Rfl: 0 .  diphenoxylate-atropine (LOMOTIL) 2.5-0.025 MG tablet, Take 1 tablet by mouth 4 (four) times daily as needed for diarrhea or loose stools., Disp: 60 tablet, Rfl: 0 .  furosemide (LASIX) 20 MG tablet, Take 2 tablets (40 mg total) by mouth daily., Disp: 60 tablet, Rfl: 5 .  lisinopril (PRINIVIL,ZESTRIL) 20 MG tablet, Take 1 tablet (20 mg  total) by mouth daily., Disp: 30 tablet, Rfl: 11 .  metoprolol succinate (TOPROL-XL) 50 MG 24 hr tablet, Take 1 tablet (50 mg total) by mouth daily. Take with or immediately following a meal. (Patient taking differently: Take 100 mg by mouth daily. Take with or immediately following a meal.), Disp: 30 tablet, Rfl: 2 .  warfarin (COUMADIN) 5 MG tablet, Take 1/2 tablet on Mondays/Thursdays; take 1 tablet all other days., Disp: 30 tablet, Rfl: 2 .  everolimus (AFINITOR) 5 MG tablet, Take 1 tablet (5 mg total) by mouth daily., Disp: 30 tablet, Rfl: 2  Allergies:  No Active Allergies  Past Medical History, Surgical history, Social history, and Family History were reviewed and updated.  Review of Systems: As above  Physical Exam:  height is 5\' 11"  (1.803 m) and weight is 155 lb (70.308 kg). His oral temperature is 97.8 F (36.6 C). His blood pressure is 90/62 and his pulse is 61. His respiration is 16.   Wt Readings from Last 3 Encounters:  07/20/15 155 lb (70.308 kg)  07/06/15 183 lb (83.008 kg)  06/22/15 184 lb (83.462 kg)     Thin African-American male. Head and neck exam shows no ocular or oral lesions. He has no palpable cervical or supraclavicular lymph nodes. His pacemaker is in the upper part of the left anterior chest wall. Lungs are clear.  Cardiac exam irregular rate and rhythm consistent with atrial fibrillation. He has 1/6 systolic ejection murmur. Abdomen is soft. He has good bowel sounds. Has marked hepatomegaly. His liver edge is probably 6 mL below the right costal margin. His liver extends across the midline. I cannot palpate his spleen below the left costal margin.. I think he may have some ascites. Extremity shows no clubbing, cyanosis or edema. There may be a little bit of nonpitting edema of the right lower leg. No venous cord is noted in the legs. He has a negative Homans sign. His good range motion of his joints. Skin exam shows no rashes. Neurological exam is  nonfocal.  Lab Results  Component Value Date   WBC 2.9* 07/20/2015   HGB 14.9 07/20/2015   HCT 42.3 07/20/2015   MCV 89 07/20/2015   PLT 103* 07/20/2015     Chemistry      Component Value Date/Time   NA 142 07/20/2015 1211   NA 139 06/22/2015 1505   NA 140 05/24/2015 1011   NA 142 03/30/2015 1100   K 3.5 07/20/2015 1211   K 4.0 06/22/2015 1505   K 4.2 05/24/2015 1011   CL 109* 06/22/2015 1505   CL 103 05/24/2015 1011   CL 105 08/24/2012 0849   CO2 27 07/20/2015 1211   CO2 24 06/22/2015 1505   CO2 23 05/24/2015 1011   BUN 11.7 07/20/2015 1211   BUN 14 06/22/2015 1505   BUN 15 05/24/2015 1011   BUN 16 03/30/2015 1100   CREATININE 1.1 07/20/2015 1211   CREATININE 1.0 06/22/2015 1505   CREATININE 1.15 05/24/2015 1011      Component Value Date/Time   CALCIUM 8.7 07/20/2015 1211   CALCIUM 9.1 06/22/2015 1505   CALCIUM 8.7 05/24/2015 1011   ALKPHOS 390* 07/20/2015 1211   ALKPHOS 316* 06/22/2015 1505   ALKPHOS 489* 05/24/2015 1011   AST 45* 07/20/2015 1211   AST 57* 06/22/2015 1505   AST 73* 05/24/2015 1011   ALT 21 07/20/2015 1211   ALT 35 06/22/2015 1505   ALT 41 05/24/2015 1011   BILITOT 4.19 Repeated and Verified* 07/20/2015 1211   BILITOT 6.30* 06/22/2015 1505   BILITOT 3.7* 05/24/2015 1011   BILITOT 1.5* 03/30/2015 1100         Impression and Plan: Jonathan Reed is a 70 year old Afro-American male. He has a well-differentiated neuroendocrine tumor. He has liver metastasis.  I Am glad that his abdomen is doing better. His liver does not appear to be as prominent. Hopefully, there will be less ascites that needs to be taken off in the future.  I really think the problem that he has is he is not eating. I'm not sure why he is not eating. He is not taking the appetite medicine that we gave him.  Does not been any side effects from the Afinitor. His liver test seemed to be a little bit better.  I still have to believe that the hepatomegaly might be coming from  congestive heart failure. He sees his cardiologist on Monday so let us see what he can do.  We will cut back his Lasix a little bit as he has diuresised pretty well. His blood pressure is on the low side and I do not want him to get dehydrated.  I spent about 40 minutes with he and his family. It is always nice to be able to talk with them.

## 2015-07-24 ENCOUNTER — Ambulatory Visit (INDEPENDENT_AMBULATORY_CARE_PROVIDER_SITE_OTHER): Payer: Medicare Other | Admitting: Pharmacist

## 2015-07-24 DIAGNOSIS — I482 Chronic atrial fibrillation, unspecified: Secondary | ICD-10-CM

## 2015-07-24 DIAGNOSIS — Z8673 Personal history of transient ischemic attack (TIA), and cerebral infarction without residual deficits: Secondary | ICD-10-CM

## 2015-07-24 DIAGNOSIS — Z7901 Long term (current) use of anticoagulants: Secondary | ICD-10-CM

## 2015-07-24 DIAGNOSIS — I639 Cerebral infarction, unspecified: Secondary | ICD-10-CM

## 2015-07-24 LAB — POCT INR: INR: 2.2

## 2015-07-24 NOTE — Patient Instructions (Signed)
Patient instructed to take medications as defined in the Anti-coagulation Track section of this encounter.  Patient instructed to OMIT doses on Mondays/Wednesdays/Fridays; take doses on  All other days.  Patient verbalized understanding of these instructions.

## 2015-07-24 NOTE — Progress Notes (Signed)
Anti-Coagulation Progress Note  Jonathan Reed is a 70 y.o. male who is currently on an anti-coagulation regimen.    RECENT RESULTS: Recent results are below, the most recent result is correlated with a dose of 10 mg. per week: Lab Results  Component Value Date   INR 2.20 07/24/2015   INR 4.0 07/17/2015   INR 3.1 07/10/2015   PROTIME 30.0* 05/15/2012    ANTI-COAG DOSE: Anticoagulation Dose Instructions as of 07/24/2015      [name] Tue Wed Thu Fri Sat   New Dose 2.5 mg 0 mg 2.5 mg 0 mg 2.5 mg 0 mg 2.5 mg       ANTICOAG SUMMARY: Anticoagulation Episode Summary    Current INR goal 2.0-3.0  Next INR check 08/07/2015  INR from last check 2.20 (07/24/2015)  Weekly max dose   Target end date Indefinite  [facility]  Preferred lab   Send INR reminders to    Indications  Long term (current) use of anticoagulants [Z79.01] CVA (cerebral infarction) [I63.9] Chronic atrial fibrillation ([place]) [I48.2]        Comments         ANTICOAG TODAY: Anticoagulation Summary as of 07/24/2015    INR goal 2.0-3.0  Selected INR 2.20 (07/24/2015)  Next INR check 08/07/2015  Target end date Indefinite   Indications  Long term (current) use of anticoagulants [Z79.01] CVA (cerebral infarction) [I63.9] Chronic atrial fibrillation ([place]) [I48.2]      Anticoagulation Episode Summary    [facility]   Preferred lab    Send INR reminders to    Comments       PATIENT INSTRUCTIONS: Patient Instructions  Patient instructed to take medications as defined in the Anti-coagulation Track section of this encounter.  Patient instructed to OMIT doses on Mondays/Wednesdays/Fridays; take doses on  All other days.  Patient verbalized understanding of these instructions.       FOLLOW-UP Return in 2 weeks (on 08/07/2015) for Follow up INR at 0945h.  [name], III Pharm.D., CACP

## 2015-07-24 NOTE — Progress Notes (Signed)
INTERNAL MEDICINE TEACHING ATTENDING ADDENDUM - Kayan Blissett M.D  Duration- indefinite, Indication- afib, INR- therapeutic. Agree with pharmacy recommendations as outlined in their note.     

## 2015-08-03 ENCOUNTER — Encounter: Payer: Self-pay | Admitting: Hematology & Oncology

## 2015-08-03 ENCOUNTER — Other Ambulatory Visit (HOSPITAL_BASED_OUTPATIENT_CLINIC_OR_DEPARTMENT_OTHER): Payer: Medicare Other

## 2015-08-03 ENCOUNTER — Ambulatory Visit (HOSPITAL_BASED_OUTPATIENT_CLINIC_OR_DEPARTMENT_OTHER): Payer: Medicare Other | Admitting: Hematology & Oncology

## 2015-08-03 VITALS — BP 98/58 | HR 59 | Temp 97.8°F | Resp 16 | Ht 71.0 in | Wt 153.0 lb

## 2015-08-03 DIAGNOSIS — C7B8 Other secondary neuroendocrine tumors: Secondary | ICD-10-CM

## 2015-08-03 DIAGNOSIS — C7A8 Other malignant neuroendocrine tumors: Secondary | ICD-10-CM

## 2015-08-03 DIAGNOSIS — R197 Diarrhea, unspecified: Secondary | ICD-10-CM

## 2015-08-03 DIAGNOSIS — R630 Anorexia: Secondary | ICD-10-CM | POA: Diagnosis not present

## 2015-08-03 DIAGNOSIS — R634 Abnormal weight loss: Secondary | ICD-10-CM

## 2015-08-03 LAB — CBC WITH DIFFERENTIAL (CANCER CENTER ONLY)
BASO#: 0 10*3/uL (ref 0.0–0.2)
BASO%: 0.4 % (ref 0.0–2.0)
EOS%: 2.8 % (ref 0.0–7.0)
Eosinophils Absolute: 0.1 10*3/uL (ref 0.0–0.5)
HEMATOCRIT: 42.7 % (ref 38.7–49.9)
HEMOGLOBIN: 15.3 g/dL (ref 13.0–17.1)
LYMPH#: 1.2 10*3/uL (ref 0.9–3.3)
LYMPH%: 22.9 % (ref 14.0–48.0)
MCH: 31.5 pg (ref 28.0–33.4)
MCHC: 35.8 g/dL (ref 32.0–35.9)
MCV: 88 fL (ref 82–98)
MONO#: 0.7 10*3/uL (ref 0.1–0.9)
MONO%: 14.7 % — ABNORMAL HIGH (ref 0.0–13.0)
NEUT%: 59.2 % (ref 40.0–80.0)
NEUTROS ABS: 3 10*3/uL (ref 1.5–6.5)
Platelets: 118 10*3/uL — ABNORMAL LOW (ref 145–400)
RBC: 4.85 10*6/uL (ref 4.20–5.70)
RDW: 20.3 % — ABNORMAL HIGH (ref 11.1–15.7)
WBC: 5 10*3/uL (ref 4.0–10.0)

## 2015-08-03 LAB — COMPREHENSIVE METABOLIC PANEL (CC13)
ALBUMIN: 2.7 g/dL — AB (ref 3.6–4.8)
ALT: 35 IU/L (ref 0–44)
AST (SGOT): 70 IU/L — ABNORMAL HIGH (ref 0–40)
Albumin/Globulin Ratio: 0.7 — ABNORMAL LOW (ref 1.2–2.2)
Alkaline Phosphatase, S: 540 IU/L — ABNORMAL HIGH (ref 39–117)
BUN / CREAT RATIO: 24 (ref 10–24)
BUN: 22 mg/dL (ref 8–27)
Bilirubin Total: 4.2 mg/dL — ABNORMAL HIGH (ref 0.0–1.2)
CALCIUM: 8.8 mg/dL (ref 8.6–10.2)
CO2: 22 mmol/L (ref 18–29)
CREATININE: 0.91 mg/dL (ref 0.76–1.27)
Chloride, Ser: 106 mmol/L (ref 96–106)
GFR calc non Af Amer: 87 mL/min/{1.73_m2} (ref >59)
GFR, EST AFRICAN AMERICAN: 100 mL/min/{1.73_m2} (ref >59)
GLUCOSE: 107 mg/dL — AB (ref 65–99)
Globulin, Total: 3.7 g/dL (ref 1.5–4.5)
Potassium, Ser: 3.8 mmol/L (ref 3.5–5.2)
Sodium: 142 mmol/L (ref 134–144)
TOTAL PROTEIN: 6.4 g/dL (ref 6.0–8.5)

## 2015-08-03 LAB — CHCC SATELLITE - SMEAR

## 2015-08-03 NOTE — Progress Notes (Signed)
Hematology and Oncology Follow Up Visit  Jonathan Reed:[id number] 12/12/1945 70 y.o. 08/03/2015   Principle Diagnosis:   Low-grade Neuroendocrine carcinoma with liver metastasis  Current Therapy:    Somatuline 120 mg subcutaneous every month  Afinitor 5mg  po q day - patient stopped taking this on his own     Interim History:  Jonathan Reed is back for follow-up. It has appeared to stop taking the Afinitor. For some reason, he seems think that this was making him sick. I'm not sure of this really was.  He still is not eating. He says he is eating but his family says that he is not. There is a lot of di dissension amongst his family about the not he is eating. He is not taking his Marinol. We tried him on Megace and is not taking Megace.   I told him that I thought that the Afinitor was helping him. His ascites has not recurred.   He is losing muscle mass. I think it's a lot more than just him having the neuroendocrine cancer. He sees his cardiologist on Monday.  His blood pressures been on the low side. I told that he could probably stop the lisinopril.   Of note, is the fact that his chromogranin A level is normal at 2.  Is Reed is having diarrhea. I gave him some Lomotil for diarrhea. Again, I am not sure if he is taking this.   He is not hurting. He's had no fever. He's had no nausea or vomiting. He's had no leg swelling. He is on Lasix. I told him to take Lasix every other day.  His last paracentesis was back a month ago.  Overall, his performance status is ECOG 2-3.   Medications:  Current outpatient prescriptions:  .  colchicine (COLCRYS) 0.6 MG tablet, Take 1 tablet (0.6 mg total) by mouth as needed (For acute gout flare up.). Take 2 tabs (1.2 mg) then 1 tab (0.6 mg) one hour later., Disp: 15 tablet, Rfl: 0 .  diphenoxylate-atropine (LOMOTIL) 2.5-0.025 MG tablet, Take 1 tablet by mouth 4 (four) times daily as needed for diarrhea or loose stools., Disp: 60 tablet, Rfl:  0 .  everolimus (AFINITOR) 5 MG tablet, Take 1 tablet (5 mg total) by mouth daily., Disp: 30 tablet, Rfl: 2 .  furosemide (LASIX) 20 MG tablet, Take 2 tablets (40 mg total) by mouth daily., Disp: 60 tablet, Rfl: 5 .  lisinopril (PRINIVIL,ZESTRIL) 20 MG tablet, Take 1 tablet (20 mg total) by mouth daily., Disp: 30 tablet, Rfl: 11 .  metoprolol succinate (TOPROL-XL) 50 MG 24 hr tablet, Take 1 tablet (50 mg total) by mouth daily. Take with or immediately following a meal. (Patient taking differently: Take 100 mg by mouth daily. Take with or immediately following a meal.), Disp: 30 tablet, Rfl: 2 .  warfarin (COUMADIN) 5 MG tablet, Take 1/2 tablet on Mondays/Thursdays; take 1 tablet all other days., Disp: 30 tablet, Rfl: 2  Allergies:  No Active Allergies  Past Medical History, Surgical history, Social history, and Family History were reviewed and updated.  Review of Systems: As above  Physical Exam:  height is 5\' 11"  (1.803 m) and weight is 153 lb (69.4 kg). His oral temperature is 97.8 F (36.6 C). His blood pressure is 98/58 and his pulse is 59. His respiration is 16.   Wt Readings from Last 3 Encounters:  08/03/15 153 lb (69.4 kg)  07/20/15 155 lb (70.308 kg)  07/06/15 183 lb (83.008 kg)  Thin African-American male. Head and neck exam shows no ocular or oral lesions. He has no palpable cervical or supraclavicular lymph nodes. His pacemaker is in the upper part of the left anterior chest wall. Lungs are clear. Cardiac exam irregular rate and rhythm consistent with atrial fibrillation. He has 1/6 systolic ejection murmur. Abdomen is soft. He has good bowel sounds. Has marked hepatomegaly. His liver edge is probably 4 mL below the right costal margin. His liver extends across the midline. I cannot palpate his spleen below the left costal margin.. I Cannot palpate any ascites. Extremity shows no clubbing, cyanosis or edema. . No venous cord is noted in the legs. He has a negative Homans  sign. He has good range motion of his joints. Skin exam shows no rashes. Neurological exam is nonfocal.  Lab Results  Component Value Date   WBC 5.0 08/03/2015   HGB 15.3 08/03/2015   HCT 42.7 08/03/2015   MCV 88 08/03/2015   PLT 118* 08/03/2015     Chemistry      Component Value Date/Time   NA 142 07/20/2015 1211   NA 139 06/22/2015 1505   NA 140 05/24/2015 1011   NA 142 03/30/2015 1100   K 3.5 07/20/2015 1211   K 4.0 06/22/2015 1505   K 4.2 05/24/2015 1011   CL 109* 06/22/2015 1505   CL 103 05/24/2015 1011   CL 105 08/24/2012 0849   CO2 27 07/20/2015 1211   CO2 24 06/22/2015 1505   CO2 23 05/24/2015 1011   BUN 11.7 07/20/2015 1211   BUN 14 06/22/2015 1505   BUN 15 05/24/2015 1011   BUN 16 03/30/2015 1100   CREATININE 1.1 07/20/2015 1211   CREATININE 1.0 06/22/2015 1505   CREATININE 1.15 05/24/2015 1011      Component Value Date/Time   CALCIUM 8.7 07/20/2015 1211   CALCIUM 9.1 06/22/2015 1505   CALCIUM 8.7 05/24/2015 1011   ALKPHOS 390* 07/20/2015 1211   ALKPHOS 316* 06/22/2015 1505   ALKPHOS 489* 05/24/2015 1011   AST 45* 07/20/2015 1211   AST 57* 06/22/2015 1505   AST 73* 05/24/2015 1011   ALT 21 07/20/2015 1211   ALT 35 06/22/2015 1505   ALT 41 05/24/2015 1011   BILITOT 4.19 Repeated and Verified* 07/20/2015 1211   BILITOT 6.30* 06/22/2015 1505   BILITOT 3.7* 05/24/2015 1011   BILITOT 1.5* 03/30/2015 1100         Impression and Plan: Jonathan Reed is a 70 year old Afro-American male. He has a well-differentiated neuroendocrine tumor. He has liver metastasis.  Again, I really think that the Afinitor is helping him area did however, he is convinced that his is causing all these problems. I try to talk to him about this. I try to tell him that I do not think that the Afinitor was causing issues for him. He's not had any mouth sores. He is really had no blood count issues. His platelet count is better. He's not had ascites recurrence. His liver does not feel as  large. Unfortunately, he just is not going to believe this and does not want to take the Afinitor. As such, we will stop this.  Currently, he is not a candidate for any additional systemic therapy. I think he been on Xeloda/Temodar in the past. I think this would be way too toxic for him.  It will be interesting to see what his cardiologist has to say. I would have to think that a lot of his issues are cardiac  related and not from this neuroendocrine cancer. I just don't think that the neuroendocrine cancer is causing problems for him.  I do want to repeat a CT scan. We can try to get this we see him back.  The big problem will be him not eating. I just do not see that we can get him to eat anymore. Why he does not want to eat, I am not sure. If he thinks it is from the Afinitor, then his appetite should come back next week.  I spent about 45 minutes with he and his family. This is an incredibly, complicated situation. There are a lot of family dynamics at work that we need to be aware of. His family truly it once was best for him. I think he just has a different opinion as to what he is doing versus what he actually is doing.   I like to see him back in 3 weeks.

## 2015-08-04 LAB — PREALBUMIN: PREALBUMIN: 21 mg/dL (ref 10–36)

## 2015-08-04 LAB — LACTATE DEHYDROGENASE: LDH: 405 U/L — AB (ref 125–245)

## 2015-08-07 ENCOUNTER — Ambulatory Visit: Payer: Medicare Other

## 2015-08-09 ENCOUNTER — Ambulatory Visit (INDEPENDENT_AMBULATORY_CARE_PROVIDER_SITE_OTHER): Payer: Medicare Other | Admitting: Pharmacist

## 2015-08-09 DIAGNOSIS — Z8673 Personal history of transient ischemic attack (TIA), and cerebral infarction without residual deficits: Secondary | ICD-10-CM

## 2015-08-09 DIAGNOSIS — Z7901 Long term (current) use of anticoagulants: Secondary | ICD-10-CM

## 2015-08-09 DIAGNOSIS — I482 Chronic atrial fibrillation, unspecified: Secondary | ICD-10-CM

## 2015-08-09 DIAGNOSIS — I639 Cerebral infarction, unspecified: Secondary | ICD-10-CM

## 2015-08-09 LAB — POCT INR: INR: 2.1

## 2015-08-09 NOTE — Progress Notes (Signed)
Anti-Coagulation Progress Note  Jonathan Reed is a 70 y.o. male who is currently on an anti-coagulation regimen.    RECENT RESULTS: Recent results are below, the most recent result is correlated with a dose of 10 mg. per week:  Will increase to 12.5mg /wk. His platelet count is rebounding "off" his chemotherapeutic drug therapy (which patient self-elected to discontinue according to Dr. Antonieta Pert note). His albumin is rising after reaching a nadir of 2.0mg %. During that phase, his INR had gone high requiring the dose de-escalation. Will see him on follow up 5-JUN-17. Lab Results  Component Value Date   INR 2.10 08/09/2015   INR 2.20 07/24/2015   INR 4.0 07/17/2015   PROTIME 30.0* 05/15/2012    ANTI-COAG DOSE: Anticoagulation Dose Instructions as of 08/09/2015      [name] Tue Wed Thu Fri Sat   New Dose 2.5 mg 0 mg 2.5 mg 2.5 mg 2.5 mg 0 mg 2.5 mg       ANTICOAG SUMMARY: Anticoagulation Episode Summary    Current INR goal 2.0-3.0  Next INR check 08/21/2015  INR from last check 2.10 (08/09/2015)  Weekly max dose   Target end date Indefinite  [facility]  Preferred lab   Send INR reminders to    Indications  Long term (current) use of anticoagulants [Z79.01] CVA (cerebral infarction) [I63.9] Chronic atrial fibrillation ([place]) [I48.2]        Comments         ANTICOAG TODAY: Anticoagulation Summary as of 08/09/2015    INR goal 2.0-3.0  Selected INR 2.10 (08/09/2015)  Next INR check 08/21/2015  Target end date Indefinite   Indications  Long term (current) use of anticoagulants [Z79.01] CVA (cerebral infarction) [I63.9] Chronic atrial fibrillation ([place]) [I48.2]      Anticoagulation Episode Summary    [facility]   Preferred lab    Send INR reminders to    Comments       PATIENT INSTRUCTIONS: Patient Instructions  Patient instructed to take medications as defined in the Anti-coagulation Track section of this encounter.   Patient instructed to take today's dose. He is instructed to OMIT doses on Mondays/Fridays.  Patient verbalized understanding of these instructions.       FOLLOW-UP Return in 12 days (on 08/21/2015) for Follow up INR at 0900h.  [name], III Pharm.D., CACP

## 2015-08-09 NOTE — Patient Instructions (Signed)
Patient instructed to take medications as defined in the Anti-coagulation Track section of this encounter.  Patient instructed to take today's dose. He is instructed to OMIT doses on Mondays/Fridays.  Patient verbalized understanding of these instructions.

## 2015-08-09 NOTE — Progress Notes (Signed)
Indication: Chronic atrial fibrillation. Duration: Indefinite. INR: At target. Agree with Dr. Gladstone Pih assessment and plan.

## 2015-08-22 ENCOUNTER — Other Ambulatory Visit: Payer: Self-pay | Admitting: *Deleted

## 2015-08-22 DIAGNOSIS — C7B8 Other secondary neuroendocrine tumors: Secondary | ICD-10-CM

## 2015-08-23 ENCOUNTER — Ambulatory Visit (INDEPENDENT_AMBULATORY_CARE_PROVIDER_SITE_OTHER): Payer: Medicare Other | Admitting: Pharmacist

## 2015-08-23 DIAGNOSIS — Z8673 Personal history of transient ischemic attack (TIA), and cerebral infarction without residual deficits: Secondary | ICD-10-CM

## 2015-08-23 DIAGNOSIS — Z7901 Long term (current) use of anticoagulants: Secondary | ICD-10-CM | POA: Diagnosis present

## 2015-08-23 DIAGNOSIS — I482 Chronic atrial fibrillation, unspecified: Secondary | ICD-10-CM

## 2015-08-23 DIAGNOSIS — I639 Cerebral infarction, unspecified: Secondary | ICD-10-CM

## 2015-08-23 LAB — POCT INR: INR: 2

## 2015-08-23 NOTE — Progress Notes (Signed)
Anti-Coagulation Progress Note  Jonathan Reed is a 70 y.o. male who is currently on an anti-coagulation regimen.    RECENT RESULTS: Recent results are below, the most recent result is correlated with a dose of 12.5 mg. per week: Patient's serum albumin is rising (improved appetite he states) and was last observed to be 4.2   Because of this, total weekly dosage requirements are going up. Will increase to 15mg  total per week.  Lab Results  Component Value Date   INR 2.0 08/23/2015   INR 2.10 08/09/2015   INR 2.20 07/24/2015   PROTIME 30.0* 05/15/2012    ANTI-COAG DOSE: Anticoagulation Dose Instructions as of 08/23/2015      Sun Mon Tue Wed Thu Fri Sat   New Dose 0 mg 2.5 mg 2.5 mg 2.5 mg 2.5 mg 2.5 mg 2.5 mg       ANTICOAG SUMMARY: Anticoagulation Episode Summary    Current INR goal 2.0-3.0  Next INR check 09/11/2015  INR from last check 2.0 (08/23/2015)  Weekly max dose   Target end date Indefinite  [facility]  Preferred lab   Send INR reminders to    Indications  Long term (current) use of anticoagulants [Z79.01] CVA (cerebral infarction) [I63.9] Chronic atrial fibrillation ([place]) [I48.2]        Comments         ANTICOAG TODAY: Anticoagulation Summary as of 08/23/2015    INR goal 2.0-3.0  Selected INR 2.0 (08/23/2015)  Next INR check 09/11/2015  Target end date Indefinite   Indications  Long term (current) use of anticoagulants [Z79.01] CVA (cerebral infarction) [I63.9] Chronic atrial fibrillation ([place]) [I48.2]      Anticoagulation Episode Summary    [facility]   Preferred lab    Send INR reminders to    Comments       PATIENT INSTRUCTIONS: Patient Instructions  Patient instructed to take medications as defined in the Anti-coagulation Track section of this encounter.  Patient instructed to take today's dose.  Patient verbalized understanding of these instructions.       FOLLOW-UP Return in 3 weeks (on  09/11/2015) for Follow up INR at 0900h.  [name], III Pharm.D., CACP

## 2015-08-23 NOTE — Patient Instructions (Signed)
Patient instructed to take medications as defined in the Anti-coagulation Track section of this encounter.  Patient instructed to take today's dose.  Patient verbalized understanding of these instructions.    

## 2015-08-24 NOTE — Progress Notes (Signed)
INTERNAL MEDICINE TEACHING ATTENDING ADDENDUM - Skila Rollins M.D  Duration- indefinite, Indication- afib, INR- therapeutic. Agree with pharmacy recommendations as outlined in their note.     

## 2015-08-25 ENCOUNTER — Ambulatory Visit (HOSPITAL_BASED_OUTPATIENT_CLINIC_OR_DEPARTMENT_OTHER): Payer: Medicare Other

## 2015-08-25 ENCOUNTER — Telehealth: Payer: Self-pay | Admitting: Family

## 2015-08-25 ENCOUNTER — Encounter: Payer: Self-pay | Admitting: Hematology & Oncology

## 2015-08-25 ENCOUNTER — Other Ambulatory Visit (HOSPITAL_BASED_OUTPATIENT_CLINIC_OR_DEPARTMENT_OTHER): Payer: Medicare Other

## 2015-08-25 ENCOUNTER — Ambulatory Visit (HOSPITAL_BASED_OUTPATIENT_CLINIC_OR_DEPARTMENT_OTHER)
Admission: RE | Admit: 2015-08-25 | Discharge: 2015-08-25 | Disposition: A | Discharge status: Home / Self Care | Payer: Medicare Other | Source: Ambulatory Visit | Attending: Hematology & Oncology | Admitting: Hematology & Oncology

## 2015-08-25 ENCOUNTER — Ambulatory Visit (HOSPITAL_BASED_OUTPATIENT_CLINIC_OR_DEPARTMENT_OTHER): Payer: Medicare Other | Admitting: Hematology & Oncology

## 2015-08-25 ENCOUNTER — Other Ambulatory Visit: Payer: Self-pay | Admitting: Hematology & Oncology

## 2015-08-25 ENCOUNTER — Encounter (HOSPITAL_BASED_OUTPATIENT_CLINIC_OR_DEPARTMENT_OTHER): Payer: Self-pay

## 2015-08-25 VITALS — BP 87/59 | HR 54 | Temp 97.2°F | Resp 18 | Ht 71.0 in | Wt 164.0 lb

## 2015-08-25 DIAGNOSIS — C7B8 Other secondary neuroendocrine tumors: Secondary | ICD-10-CM

## 2015-08-25 DIAGNOSIS — R634 Abnormal weight loss: Secondary | ICD-10-CM | POA: Insufficient documentation

## 2015-08-25 DIAGNOSIS — J90 Pleural effusion, not elsewhere classified: Secondary | ICD-10-CM | POA: Insufficient documentation

## 2015-08-25 DIAGNOSIS — C7A8 Other malignant neuroendocrine tumors: Secondary | ICD-10-CM | POA: Diagnosis present

## 2015-08-25 DIAGNOSIS — Z950 Presence of cardiac pacemaker: Secondary | ICD-10-CM

## 2015-08-25 DIAGNOSIS — R188 Other ascites: Secondary | ICD-10-CM | POA: Insufficient documentation

## 2015-08-25 LAB — CBC WITH DIFFERENTIAL (CANCER CENTER ONLY)
BASO#: 0 10*3/uL (ref 0.0–0.2)
BASO%: 0.4 % (ref 0.0–2.0)
EOS%: 1.1 % (ref 0.0–7.0)
Eosinophils Absolute: 0.1 10*3/uL (ref 0.0–0.5)
HEMATOCRIT: 41.1 % (ref 38.7–49.9)
HEMOGLOBIN: 15 g/dL (ref 13.0–17.1)
LYMPH#: 1.2 10*3/uL (ref 0.9–3.3)
LYMPH%: 17.2 % (ref 14.0–48.0)
MCH: 32.3 pg (ref 28.0–33.4)
MCHC: 36.5 g/dL — ABNORMAL HIGH (ref 32.0–35.9)
MCV: 88 fL (ref 82–98)
MONO#: 1 10*3/uL — ABNORMAL HIGH (ref 0.1–0.9)
MONO%: 14 % — ABNORMAL HIGH (ref 0.0–13.0)
NEUT%: 67.3 % (ref 40.0–80.0)
NEUTROS ABS: 4.7 10*3/uL (ref 1.5–6.5)
Platelets: 161 10*3/uL (ref 145–400)
RBC: 4.65 10*6/uL (ref 4.20–5.70)
RDW: 20.5 % — ABNORMAL HIGH (ref 11.1–15.7)
WBC: 7 10*3/uL (ref 4.0–10.0)

## 2015-08-25 LAB — COMPREHENSIVE METABOLIC PANEL
ALT: 35 U/L (ref 0–55)
AST: 66 U/L — AB (ref 5–34)
Albumin: 2.1 g/dL — ABNORMAL LOW (ref 3.5–5.0)
Alkaline Phosphatase: 416 U/L — ABNORMAL HIGH (ref 40–150)
Anion Gap: 11 mEq/L (ref 3–11)
BILIRUBIN TOTAL: 8.34 mg/dL — AB (ref 0.20–1.20)
BUN: 17.2 mg/dL (ref 7.0–26.0)
CO2: 22 meq/L (ref 22–29)
CREATININE: 0.9 mg/dL (ref 0.7–1.3)
Calcium: 8.7 mg/dL (ref 8.4–10.4)
Chloride: 105 mEq/L (ref 98–109)
EGFR: >90 mL/min/{1.73_m2} (ref >90)
GLUCOSE: 79 mg/dL (ref 70–140)
Potassium: 4.3 mEq/L (ref 3.5–5.1)
SODIUM: 139 meq/L (ref 136–145)
TOTAL PROTEIN: 5.7 g/dL — AB (ref 6.4–8.3)

## 2015-08-25 MED ORDER — PROCHLORPERAZINE MALEATE 5 MG PO TABS: 5.0000 mg | ORAL_TABLET | Freq: Four times a day (QID) | ORAL | Status: DC | PRN

## 2015-08-25 MED ORDER — CAPECITABINE 500 MG PO TABS: ORAL_TABLET | ORAL | Status: AC

## 2015-08-25 MED ORDER — TEMOZOLOMIDE 100 MG PO CAPS: ORAL_CAPSULE | ORAL | Status: AC

## 2015-08-25 MED ORDER — ONDANSETRON HCL 8 MG PO TABS: 8.0000 mg | ORAL_TABLET | Freq: Two times a day (BID) | ORAL | Status: AC

## 2015-08-25 MED ORDER — CAPECITABINE 150 MG PO TABS: ORAL_TABLET | ORAL | Status: AC

## 2015-08-25 MED ORDER — LANREOTIDE ACETATE 120 MG/0.5ML ~~LOC~~ SOLN
120.0000 mg | Freq: Once | SUBCUTANEOUS | Status: AC
  Administered 2015-08-25: 120 mg via SUBCUTANEOUS
  Filled 2015-08-25: qty 120

## 2015-08-25 MED ORDER — CAPECITABINE 500 MG PO TABS: ORAL_TABLET | ORAL | Status: DC

## 2015-08-25 MED ORDER — IOPAMIDOL (ISOVUE-300) INJECTION 61%
100.0000 mL | Freq: Once | INTRAVENOUS | Status: AC | PRN
  Administered 2015-08-25: 100 mL via INTRAVENOUS

## 2015-08-25 NOTE — Patient Instructions (Signed)
Lanreotide injection What is this medicine? LANREOTIDE (lan REE oh tide) is used to reduce blood levels of growth hormone in patients with a condition called acromegaly. It also works to slow or stop tumor growth in patients with gastroenteropancreatic neuroendocrine tumor (GEP-NET). This medicine may be used for other purposes; ask your health care provider or pharmacist if you have questions. What should I tell my health care provider before I take this medicine? They need to know if you have any of these conditions: -diabetes -gallbladder disease -heart disease -kidney disease -liver disease -an unusual or allergic reaction to lanreotide, other medicines, latex, foods, dyes, or preservatives -pregnant or trying to get pregnant -breast-feeding How should I use this medicine? This medicine is for injection under the skin. It is given by a health care professional in a hospital or clinic setting. Contact your pediatrician or health care professional regarding the use of this medicine in children. Special care may be needed. Overdosage: If you think you have taken too much of this medicine contact a poison control center or emergency room at once. NOTE: This medicine is only for you. Do not share this medicine with others. What if I miss a dose? It is important not to miss your dose. Call your doctor or health care professional if you are unable to keep an appointment. What may interact with this medicine? -bromocriptine -cyclosporine -medicines for diabetes, including insulin -medicines for heart disease or hypertension -quinidine This list may not describe all possible interactions. Give your health care provider a list of all the medicines, herbs, non-prescription drugs, or dietary supplements you use. Also tell them if you smoke, drink alcohol, or use illegal drugs. Some items may interact with your medicine. What should I watch for while using this medicine? Visit your doctor or  health care professional for regular checks on your progress. Your condition will be monitored carefully while you are receiving this medicine. This medicine may cause increases or decreases in blood sugar. Signs of high blood sugar include frequent urination, unusual thirst, flushed or dry skin, difficulty breathing, drowsiness, stomach ache, nausea, vomiting or dry mouth. Signs of low blood sugar include chills, cool, pale skin or cold sweats, drowsiness, extreme hunger, fast heartbeat, headache, nausea, nervousness or anxiety, shakiness, trembling, unsteadiness, tiredness, or weakness. Contact your doctor or health care professional right away if you experience any of these symptoms. What side effects may I notice from receiving this medicine? Side effects that you should report to your doctor or health care professional as soon as possible: -allergic reactions like skin rash, itching or hives, swelling of the face, lips, or tongue -changes in blood sugar -changes in heart rate -severe stomach pain Side effects that usually do not require medical attention (report to your doctor or health care professional if they continue or are bothersome): -diarrhea or constipation -gas or stomach pain -nausea, vomiting -pain, redness, swelling and irritation at site where injected This list may not describe all possible side effects. Call your doctor for medical advice about side effects. You may report side effects to FDA at 1-800-FDA-1088. Where should I keep my medicine? This drug is given in a hospital or clinic and will not be stored at home. NOTE: This sheet is a summary. It may not cover all possible information. If you have questions about this medicine, talk to your doctor, pharmacist, or health care provider.    2016, Elsevier/Gold Standard. (2013-03-03 17:43:04)

## 2015-08-25 NOTE — Telephone Encounter (Signed)
Spoke with [facility] lab and received critical Bilirubin level of 8.3. Dr. Marin Olp is aware and no new orders given.

## 2015-08-25 NOTE — Progress Notes (Signed)
Hematology and Oncology Follow Up Visit  Jonathan Reed [id number] [birth year] 70 y.o. 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 Jonathan Reed is back for follow-up. No surprise, his malignancy appears to be progressing. We did go ahead and get a CT scan on him today. He has a severely enlarged liver. He has innumerable lesions within the liver. The radiologist feels that these lesions are larger.  He does not have any palpable ascites.  He is on oxygen at home. He did see his cardiologist at [facility]. I told him to wear the oxygen at nighttime while he sleeps. This will help him the next day as the oxygen will be stored up.   It is still hard see how much she is eating. His wife does not think he is eating all that much. His weight is up 11 pounds since we last saw him. I don't think this is water weight.   He is on Coumadin. He's had no bleeding. He has a Coumadin clinic that he goes to.  We are going to have to start another line of therapy. He had been on Xeloda/Temodar in the past. I'm still not sure how well he tolerated this. There is still some discrepancy with the family asked how well he did with this. He clearly cannot have full dose therapy.  Because of the Xeloda, he will have to see the Coumadin clinic more frequently. I will get hold of his pharmacist to manages his Coumadin to let him know.   He comes in a wheelchair. His performance status is just not that great (ECOG 3). However, he just does not understand how difficult things are for him. I have spent quite a bit of time talking to him and his family about his condition. The fat that he really has never taken treatment that we have prescribed is not helping the situation.   His family is trying to do their best to get  him to do what needs to be done.   His blood pressure is quite low. I'm still not sure as to what he is taking what he is not taking for blood pressure. He should be off the lisinopril. He should be off diuretics.   He is not hurting. He's had no diarrhea. He's had no fever. He's had no bleeding. He's had no joint issues.    Medications:  Current outpatient prescriptions:  .  colchicine (COLCRYS) 0.6 MG tablet, Take 1 tablet (0.6 mg total) by mouth as needed (For acute gout flare up.). Take 2 tabs (1.2 mg) then 1 tab (0.6 mg) one hour later., Disp: 15 tablet, Rfl: 0 .  diphenoxylate-atropine (LOMOTIL) 2.5-0.025 MG tablet, Take 1 tablet by mouth 4 (four) times daily as needed for diarrhea or loose stools., Disp: 60 tablet, Rfl: 0 .  furosemide (LASIX) 20 MG tablet, Take 2 tablets (40 mg total) by mouth daily., Disp: 60 tablet, Rfl: 5 .  metoprolol succinate (TOPROL-XL) 50 MG 24 hr tablet, Take 1 tablet (50 mg total) by mouth daily. Take with or immediately following a meal. (Patient taking differently: Take 100 mg by mouth daily. Take with or immediately following a meal.), Disp: 30 tablet, Rfl: 2 .  warfarin (COUMADIN) 5 MG tablet, Take 1/2  tablet on Mondays/Thursdays; take 1 tablet all other days., Disp: 30 tablet, Rfl: 2 .  capecitabine (XELODA) 150 MG tablet, Take 2 pills twice a day for 14 days, Disp: 56 tablet, Rfl: 4 .  capecitabine (XELODA) 500 MG tablet, Take 2 pills twice a day for 14 days every 21 days, Disp: 56 tablet, Rfl: 3 .  ondansetron (ZOFRAN) 8 MG tablet, Take 1 tablet (8 mg total) by mouth 2 (two) times daily., Disp: 30 tablet, Rfl: 4 .  prochlorperazine (COMPAZINE) 5 MG tablet, TAKE 1 TABLET BY MOUTH EVERY 6 HOURS AS NEEDED FOR NAUSEA OR VOMITING, Disp: 360 tablet, Rfl: 4 .  temozolomide (TEMODAR) 100 MG capsule, Take 3 capsules a day for 4 days only every 21 days, Disp: 12 capsule, Rfl: 4  Allergies:  No Active Allergies  Past Medical History, Surgical history, Social  history, and Family History were reviewed and updated.  Review of Systems: As above  Physical Exam:  height is 5\' 11"  (1.803 m) and weight is 164 lb (74.39 kg). His oral temperature is 97.2 F (36.2 C). His blood pressure is 87/59 and his pulse is 54. His respiration is 18.   Wt Readings from Last 3 Encounters:  08/25/15 164 lb (74.39 kg)  08/03/15 153 lb (69.4 kg)  07/20/15 155 lb (70.308 kg)     Thin African-American male. Head and neck exam shows no ocular or oral lesions. He has no palpable cervical or supraclavicular lymph nodes. His pacemaker is in the upper part of the left anterior chest wall. Lungs are clear. Cardiac exam irregular rate and rhythm consistent with atrial fibrillation. He has 1/6 systolic ejection murmur. Abdomen is soft. He has good bowel sounds. Has marked hepatomegaly. His liver edge is probably 7cm below the right costal margin. His liver extends across the midline. I cannot palpate his spleen below the left costal margin.. I cannot palpate any ascites. His extremities shows no clubbing, cyanosis or edema. . No venous cord is noted in the legs. He has a negative Homans sign. He has good range motion of his joints. Skin exam shows no rashes. Neurological exam is nonfocal.  Lab Results  Component Value Date   WBC 7.0 08/25/2015   HGB 15.0 08/25/2015   HCT 41.1 08/25/2015   MCV 88 08/25/2015   PLT 161 08/25/2015     Chemistry      Component Value Date/Time   NA 139 08/25/2015 1030   NA 142 08/03/2015 1424   NA 139 06/22/2015 1505   NA 142 03/30/2015 1100   K 4.3 08/25/2015 1030   K 3.8 08/03/2015 1424   K 4.0 06/22/2015 1505   K 4.2 05/24/2015 1011   CL 106 08/03/2015 1424   CL 109* 06/22/2015 1505   CL 103 05/24/2015 1011   CL 105 08/24/2012 0849   CO2 22 08/25/2015 1030   CO2 22 08/03/2015 1424   CO2 24 06/22/2015 1505   CO2 23 05/24/2015 1011   BUN 17.2 08/25/2015 1030   BUN 22 08/03/2015 1424   BUN 14 06/22/2015 1505   BUN 16 03/30/2015  1100   CREATININE 0.9 08/25/2015 1030   CREATININE 0.91 08/03/2015 1424   CREATININE 1.0 06/22/2015 1505   CREATININE 1.15 05/24/2015 1011      Component Value Date/Time   CALCIUM 8.7 08/25/2015 1030   CALCIUM 8.8 08/03/2015 1424   CALCIUM 9.1 06/22/2015 1505   CALCIUM 8.7 05/24/2015 1011   ALKPHOS 416* 08/25/2015 1030   ALKPHOS 540* 08/03/2015  1424   ALKPHOS 316* 06/22/2015 1505   ALKPHOS 489* 05/24/2015 1011   AST 66* 08/25/2015 1030   AST 70* 08/03/2015 1424   AST 57* 06/22/2015 1505   AST 73* 05/24/2015 1011   ALT 35 08/25/2015 1030   ALT 35 08/03/2015 1424   ALT 35 06/22/2015 1505   ALT 41 05/24/2015 1011   BILITOT 8.34* 08/25/2015 1030   BILITOT 4.2* 08/03/2015 1424   BILITOT 6.30* 06/22/2015 1505   BILITOT 1.5* 03/30/2015 1100         Impression and Plan: Mr. Homan is a 70 year old Afro-American male. He has a well-differentiated neuroendocrine tumor. He has liver metastasis.  This is a very difficult situation. I hope that he tolerates this Xeloda cycle Temodar combination okay. Again I'm cutting his dose back by about 25%.  He has obvious hepatic dysfunction. Some of this might be from hepatic congestion from cardiomyopathy. 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  I think his prognosis is clearly going be tied to whether or not he eats. We have tried him on multiple different appetite stimulants. He is not really taking these. His family tries to encourage him.   I spoke to our pharmacist to see if we had to make any other dose reductions because of his hepatic dysfunction. She says that we do not.   I wrote down on paper how he is to take the Xeloda and Temodar. I gave this to his daughter so that there will be no mistake as to how he is to take this.   Again, he'll have to have his Coumadin checked closely by the Coumadin clinic.   I know that Mr. Salmons is doing what he feels is best. Again I'm trying to encourage him as much as I can.   At least  his blood counts are doing okay. His bilirubin just is quite high. His actual liver test are not all that bad.   I will like to get him back in about 3 weeks.   I spent about 45 minutes with he and his family. They are very nice. It is always futo talk with them.

## 2015-08-28 ENCOUNTER — Telehealth: Payer: Self-pay | Admitting: Pharmacist

## 2015-08-28 NOTE — Telephone Encounter (Signed)
Spoke with Tammy. Has not yet picked up the [facility] (that gives marked hypoprothrombinemic respnose with warfarin). She is awaiting its filling at her pharmacy. Advised as soon as dispensed to let me know so we can get the patient in for an INR. Was explained to Tammy that the drug-drug interaction between [place] and warfarin will increase the INR and as such could potentiate bleeding. Reviewed last note by Dr. Marin Olp as well--radiologic exam indicates increased hepatic involvement which too may potentiate decreased production of vitamin K dependent clotting factors--increasing INR and necessitating dose reduction in warfarin. Last increase in warfarin was predicated upon an eleven pound weight gain that was felt not to be fluid (as well as a decreased INR). Daughter verbalizes requirement to call me as soon as the new medication is dispensed so we can create a plan of seeing the patient shortly after commencing new medication + warfarin.

## 2015-08-28 NOTE — Telephone Encounter (Signed)
Dr. Marin Olp calls indicating patient has been placed on Xeloda (capecitabine) with his Coumadin (warfarin). This may significantly potentiate the hypoprothrombinemic effect of warfarin and other oral anticoagulants. Will check INR more frequently. Will call patient/daughter to see if he can come in today for INR and will follow more closely/more frequently.

## 2015-09-01 ENCOUNTER — Encounter: Payer: Self-pay | Admitting: Hematology & Oncology

## 2015-09-01 ENCOUNTER — Encounter: Payer: Self-pay | Admitting: Pharmacist

## 2015-09-01 ENCOUNTER — Ambulatory Visit (INDEPENDENT_AMBULATORY_CARE_PROVIDER_SITE_OTHER): Payer: Medicare Other | Admitting: Pharmacist

## 2015-09-01 VITALS — BP 104/81 | HR 62

## 2015-09-01 DIAGNOSIS — I482 Chronic atrial fibrillation, unspecified: Secondary | ICD-10-CM

## 2015-09-01 DIAGNOSIS — I639 Cerebral infarction, unspecified: Secondary | ICD-10-CM

## 2015-09-01 DIAGNOSIS — Z8673 Personal history of transient ischemic attack (TIA), and cerebral infarction without residual deficits: Secondary | ICD-10-CM | POA: Diagnosis not present

## 2015-09-01 DIAGNOSIS — Z7901 Long term (current) use of anticoagulants: Secondary | ICD-10-CM

## 2015-09-01 LAB — POCT INR: INR: 3.4

## 2015-09-01 NOTE — Progress Notes (Signed)
Anti-Coagulation Progress Note  Jonathan Reed is a 70 y.o. male who is currently on an anti-coagulation regimen.    RECENT RESULTS: Recent results are below, the most recent result is correlated with a dose of 15 mg. per week:  He has most recently been commenced upon capecitabine--known to cause a marked hypoprothrombinemic response with concomitant warfarin therapy. He has been on this new medication for 2 days he states. Dr. Marin Olp had called me personally alerting me to this new medication. I had discussed with patient's daughter [name]) that we needed to commence the capecitabine to "see" what the impact (how quickly the interaction would occur and the extent of the interaction) would be. After 2 doses of capecitabine + warfarin, INR has gone from 2.0 to 3.4. Instructions for warfarin for today through Sunday are documented below. Patient was instructed to call me between today and next scheduled visit if there were any signs of bleeding. Was instructed in the event of overt bleeding to hold pressure and proceed to ED or call EMS. Lab Results  Component Value Date   INR 3.40 09/01/2015   INR 2.0 08/23/2015   INR 2.10 08/09/2015   PROTIME 30.0* 05/15/2012    ANTI-COAG DOSE: Anticoagulation Dose Instructions as of 09/01/2015      [name] Tue Wed Thu Fri Sat   New Dose 2.5 mg 0 mg 2.5 mg 0 mg 2.5 mg 0 mg 2.5 mg       ANTICOAG SUMMARY: Anticoagulation Episode Summary    Current INR goal 2.0-3.0  Next INR check 09/04/2015  INR from last check 3.40! (09/01/2015)  Weekly max dose   Target end date Indefinite  [facility]  Preferred lab   Send INR reminders to    Indications  Long term (current) use of anticoagulants [Z79.01] CVA (cerebral infarction) [I63.9] Chronic atrial fibrillation ([place]) [I48.2]        Comments         ANTICOAG TODAY: Anticoagulation Summary as of 09/01/2015    INR goal 2.0-3.0  Selected INR 3.40! (09/01/2015)  Next INR check  09/04/2015  Target end date Indefinite   Indications  Long term (current) use of anticoagulants [Z79.01] CVA (cerebral infarction) [I63.9] Chronic atrial fibrillation ([place]) [I48.2]      Anticoagulation Episode Summary    INR check location [facility]   Preferred lab    Send INR reminders to    Comments       PATIENT INSTRUCTIONS: Patient Instructions  Patient instructed to take medications as defined in the Anti-coagulation Track section of this encounter.  Patient instructed to OMIT today's dose.  Patient verbalized understanding of these instructions.       FOLLOW-UP Return in 3 days (on 09/04/2015) for Follow up INR at 0845h.  [name], III Pharm.D., CACP

## 2015-09-01 NOTE — Patient Instructions (Signed)
Patient instructed to take medications as defined in the Anti-coagulation Track section of this encounter.  Patient instructed to OMIT today's dose.  Patient verbalized understanding of these instructions.    

## 2015-09-04 ENCOUNTER — Ambulatory Visit (INDEPENDENT_AMBULATORY_CARE_PROVIDER_SITE_OTHER): Payer: Medicare Other | Admitting: Pharmacist

## 2015-09-04 DIAGNOSIS — Z7901 Long term (current) use of anticoagulants: Secondary | ICD-10-CM | POA: Diagnosis not present

## 2015-09-04 DIAGNOSIS — I482 Chronic atrial fibrillation, unspecified: Secondary | ICD-10-CM

## 2015-09-04 DIAGNOSIS — I639 Cerebral infarction, unspecified: Secondary | ICD-10-CM

## 2015-09-04 DIAGNOSIS — Z8673 Personal history of transient ischemic attack (TIA), and cerebral infarction without residual deficits: Secondary | ICD-10-CM | POA: Diagnosis not present

## 2015-09-04 LAB — POCT INR: INR: 3.5

## 2015-09-04 NOTE — Progress Notes (Signed)
Anti-Coagulation Progress Note  Jonathan Reed is a 70 y.o. male who is currently on an anti-coagulation regimen.    RECENT RESULTS: Recent results are below, the most recent result is correlated with a dose of 10 mg. per week--but the first "held" dose for this new regimen was just this past Friday, i.e. He has had only 1 omitted dose of the planned omitted doses per week. Will continue this regimen of 0 tablets M/W/F; 1/2 x 5mg  (2.5mg  dose) on all other days (Sun/Tues/Thurs/Sat): Lab Results  Component Value Date   INR 3.5 09/04/2015   INR 3.40 09/01/2015   INR 2.0 08/23/2015   PROTIME 30.0* 05/15/2012    ANTI-COAG DOSE: Anticoagulation Dose Instructions as of 09/04/2015      [name] Tue Wed Thu Fri Sat   New Dose 2.5 mg 0 mg 2.5 mg 0 mg 2.5 mg 0 mg 2.5 mg       ANTICOAG SUMMARY: Anticoagulation Episode Summary    Current INR goal 2.0-3.0  Next INR check 09/11/2015  INR from last check 3.5! (09/04/2015)  Weekly max dose   Target end date Indefinite  INR check location [facility]  Preferred lab   Send INR reminders to    Indications  Long term (current) use of anticoagulants [Z79.01] CVA (cerebral infarction) [I63.9] Chronic atrial fibrillation ([place]) [I48.2]        Comments         ANTICOAG TODAY: Anticoagulation Summary as of 09/04/2015    INR goal 2.0-3.0  Selected INR 3.5! (09/04/2015)  Next INR check 09/11/2015  Target end date Indefinite   Indications  Long term (current) use of anticoagulants [Z79.01] CVA (cerebral infarction) [I63.9] Chronic atrial fibrillation ([place]) [I48.2]      Anticoagulation Episode Summary    INR check location [facility]   Preferred lab    Send INR reminders to    Comments       PATIENT INSTRUCTIONS: Patient Instructions  Patient instructed to take medications as defined in the Anti-coagulation Track section of this encounter.  Patient instructed to OMIT doses on Mondays/Wednesdays/Fridays.  Patient verbalized  understanding of these instructions.       FOLLOW-UP Return in 7 days (on 09/11/2015) for Follow up INR at 0845h.  [name], III Pharm.D., CACP

## 2015-09-04 NOTE — Patient Instructions (Signed)
Patient instructed to take medications as defined in the Anti-coagulation Track section of this encounter.  Patient instructed to OMIT doses on Mondays/Wednesdays/Fridays.  Patient verbalized understanding of these instructions.

## 2015-09-05 ENCOUNTER — Encounter: Payer: Self-pay | Admitting: *Deleted

## 2015-09-11 ENCOUNTER — Ambulatory Visit: Payer: Medicare Other

## 2015-09-11 ENCOUNTER — Ambulatory Visit (INDEPENDENT_AMBULATORY_CARE_PROVIDER_SITE_OTHER): Payer: Medicare Other | Admitting: Pharmacist

## 2015-09-11 DIAGNOSIS — Z7901 Long term (current) use of anticoagulants: Secondary | ICD-10-CM

## 2015-09-11 DIAGNOSIS — I6309 Cerebral infarction due to thrombosis of other precerebral artery: Secondary | ICD-10-CM

## 2015-09-11 DIAGNOSIS — Z8673 Personal history of transient ischemic attack (TIA), and cerebral infarction without residual deficits: Secondary | ICD-10-CM | POA: Diagnosis not present

## 2015-09-11 DIAGNOSIS — I482 Chronic atrial fibrillation, unspecified: Secondary | ICD-10-CM

## 2015-09-11 LAB — POCT INR: INR: 5.2

## 2015-09-11 NOTE — Patient Instructions (Signed)
Patient instructed to take medications as defined in the Anti-coagulation Track section of this encounter.  Patient instructed to OMIT today's dose, Tuesdays and Wednesdays dose. Take 2.5mg  (1/2 x 5mg  tablet) on Thursday. INR re-check on Friday 30-JUN-17.f  Patient verbalized understanding of these instructions.

## 2015-09-11 NOTE — Progress Notes (Signed)
Anti-Coagulation Progress Note  Jonathan Reed is a 70 y.o. male who is currently on an anti-coagulation regimen.    RECENT RESULTS: Recent results are below, the most recent result is correlated with a dose of 10 mg. per week: Lab Results  Component Value Date   INR 5.20 09/11/2015   INR 3.5 09/04/2015   INR 3.40 09/01/2015   PROTIME 30.0* 05/15/2012    ANTI-COAG DOSE: Anticoagulation Dose Instructions as of 09/11/2015      [name] Tue Wed Thu Fri Sat   New Dose 0 mg 0 mg 0 mg 0 mg 2.5 mg 0 mg 0 mg    Description        Will omit doses daily as shown above (excepting Thursdays 2.5mg  dose) and re-check INR on Friday 30-JUN-17.        ANTICOAG SUMMARY: Anticoagulation Episode Summary    Current INR goal 2.0-3.0  Next INR check 08/27/2015  INR from last check 5.20! (09/11/2015)  Weekly max dose   Target end date Indefinite  INR check location [facility]  Preferred lab   Send INR reminders to    Indications  Long term (current) use of anticoagulants [Z79.01] CVA (cerebral infarction) [I63.9] Chronic atrial fibrillation ([place]) [I48.2]        Comments         ANTICOAG TODAY: Anticoagulation Summary as of 09/11/2015    INR goal 2.0-3.0  Selected INR 5.20! (09/11/2015)  Next INR check 08/30/2015  Target end date Indefinite   Indications  Long term (current) use of anticoagulants [Z79.01] CVA (cerebral infarction) [I63.9] Chronic atrial fibrillation ([place]) [I48.2]      Anticoagulation Episode Summary    INR check location [facility]   Preferred lab    Send INR reminders to    Comments       PATIENT INSTRUCTIONS: Patient Instructions  Patient instructed to take medications as defined in the Anti-coagulation Track section of this encounter.  Patient instructed to OMIT today's dose, Tuesdays and Wednesdays dose. Take 2.5mg  (1/2 x 5mg  tablet) on Thursday. INR re-check on Friday 30-JUN-17.f  Patient verbalized understanding of these instructions.        FOLLOW-UP Return in 4 days (on 08/17/2015) for Follow up INR at 0930h.  [name], III Pharm.D., CACP

## 2015-09-15 ENCOUNTER — Encounter (HOSPITAL_COMMUNITY): Payer: Self-pay

## 2015-09-15 ENCOUNTER — Ambulatory Visit (INDEPENDENT_AMBULATORY_CARE_PROVIDER_SITE_OTHER): Payer: Medicare Other | Admitting: Pharmacist

## 2015-09-15 ENCOUNTER — Other Ambulatory Visit: Payer: Self-pay

## 2015-09-15 ENCOUNTER — Inpatient Hospital Stay (HOSPITAL_COMMUNITY)
Admission: EM | Admit: 2015-09-15 | Discharge: 2015-10-17 | DRG: 435 | Disposition: E | Payer: Medicare Other | Attending: Internal Medicine | Admitting: Internal Medicine

## 2015-09-15 ENCOUNTER — Emergency Department (HOSPITAL_COMMUNITY): Payer: Medicare Other

## 2015-09-15 DIAGNOSIS — E860 Dehydration: Secondary | ICD-10-CM | POA: Diagnosis present

## 2015-09-15 DIAGNOSIS — R531 Weakness: Secondary | ICD-10-CM | POA: Diagnosis not present

## 2015-09-15 DIAGNOSIS — I639 Cerebral infarction, unspecified: Secondary | ICD-10-CM

## 2015-09-15 DIAGNOSIS — I110 Hypertensive heart disease with heart failure: Secondary | ICD-10-CM | POA: Diagnosis present

## 2015-09-15 DIAGNOSIS — I10 Essential (primary) hypertension: Secondary | ICD-10-CM | POA: Diagnosis present

## 2015-09-15 DIAGNOSIS — R188 Other ascites: Secondary | ICD-10-CM

## 2015-09-15 DIAGNOSIS — C7B8 Other secondary neuroendocrine tumors: Secondary | ICD-10-CM | POA: Diagnosis not present

## 2015-09-15 DIAGNOSIS — C7A8 Other malignant neuroendocrine tumors: Secondary | ICD-10-CM | POA: Diagnosis present

## 2015-09-15 DIAGNOSIS — Z7901 Long term (current) use of anticoagulants: Secondary | ICD-10-CM

## 2015-09-15 DIAGNOSIS — C7B02 Secondary carcinoid tumors of liver: Secondary | ICD-10-CM | POA: Diagnosis not present

## 2015-09-15 DIAGNOSIS — I2510 Atherosclerotic heart disease of native coronary artery without angina pectoris: Secondary | ICD-10-CM | POA: Diagnosis present

## 2015-09-15 DIAGNOSIS — Z823 Family history of stroke: Secondary | ICD-10-CM

## 2015-09-15 DIAGNOSIS — Z8673 Personal history of transient ischemic attack (TIA), and cerebral infarction without residual deficits: Secondary | ICD-10-CM

## 2015-09-15 DIAGNOSIS — Z95810 Presence of automatic (implantable) cardiac defibrillator: Secondary | ICD-10-CM

## 2015-09-15 DIAGNOSIS — I482 Chronic atrial fibrillation, unspecified: Secondary | ICD-10-CM | POA: Diagnosis present

## 2015-09-15 DIAGNOSIS — R160 Hepatomegaly, not elsewhere classified: Secondary | ICD-10-CM | POA: Diagnosis present

## 2015-09-15 DIAGNOSIS — B9620 Unspecified Escherichia coli [E. coli] as the cause of diseases classified elsewhere: Secondary | ICD-10-CM | POA: Diagnosis present

## 2015-09-15 DIAGNOSIS — M109 Gout, unspecified: Secondary | ICD-10-CM | POA: Diagnosis present

## 2015-09-15 DIAGNOSIS — Z6821 Body mass index (BMI) 21.0-21.9, adult: Secondary | ICD-10-CM

## 2015-09-15 DIAGNOSIS — Z841 Family history of disorders of kidney and ureter: Secondary | ICD-10-CM

## 2015-09-15 DIAGNOSIS — I472 Ventricular tachycardia: Secondary | ICD-10-CM | POA: Diagnosis present

## 2015-09-15 DIAGNOSIS — Z833 Family history of diabetes mellitus: Secondary | ICD-10-CM

## 2015-09-15 DIAGNOSIS — Z803 Family history of malignant neoplasm of breast: Secondary | ICD-10-CM

## 2015-09-15 DIAGNOSIS — Z8042 Family history of malignant neoplasm of prostate: Secondary | ICD-10-CM

## 2015-09-15 DIAGNOSIS — I959 Hypotension, unspecified: Secondary | ICD-10-CM | POA: Diagnosis present

## 2015-09-15 DIAGNOSIS — E43 Unspecified severe protein-calorie malnutrition: Secondary | ICD-10-CM | POA: Diagnosis present

## 2015-09-15 DIAGNOSIS — I255 Ischemic cardiomyopathy: Secondary | ICD-10-CM | POA: Diagnosis present

## 2015-09-15 DIAGNOSIS — D696 Thrombocytopenia, unspecified: Secondary | ICD-10-CM | POA: Diagnosis present

## 2015-09-15 DIAGNOSIS — Z79899 Other long term (current) drug therapy: Secondary | ICD-10-CM

## 2015-09-15 DIAGNOSIS — Z8249 Family history of ischemic heart disease and other diseases of the circulatory system: Secondary | ICD-10-CM

## 2015-09-15 DIAGNOSIS — E875 Hyperkalemia: Secondary | ICD-10-CM | POA: Diagnosis present

## 2015-09-15 DIAGNOSIS — K7200 Acute and subacute hepatic failure without coma: Secondary | ICD-10-CM

## 2015-09-15 DIAGNOSIS — Z86010 Personal history of colonic polyps: Secondary | ICD-10-CM

## 2015-09-15 DIAGNOSIS — Z66 Do not resuscitate: Secondary | ICD-10-CM | POA: Diagnosis present

## 2015-09-15 DIAGNOSIS — N390 Urinary tract infection, site not specified: Secondary | ICD-10-CM | POA: Diagnosis present

## 2015-09-15 DIAGNOSIS — R627 Adult failure to thrive: Secondary | ICD-10-CM | POA: Diagnosis present

## 2015-09-15 DIAGNOSIS — E872 Acidosis, unspecified: Secondary | ICD-10-CM

## 2015-09-15 DIAGNOSIS — R180 Malignant ascites: Secondary | ICD-10-CM

## 2015-09-15 DIAGNOSIS — Z515 Encounter for palliative care: Secondary | ICD-10-CM | POA: Diagnosis present

## 2015-09-15 DIAGNOSIS — I509 Heart failure, unspecified: Secondary | ICD-10-CM | POA: Diagnosis present

## 2015-09-15 DIAGNOSIS — N179 Acute kidney failure, unspecified: Secondary | ICD-10-CM | POA: Insufficient documentation

## 2015-09-15 DIAGNOSIS — D6959 Other secondary thrombocytopenia: Secondary | ICD-10-CM | POA: Diagnosis present

## 2015-09-15 LAB — URINE MICROSCOPIC-ADD ON

## 2015-09-15 LAB — CBC
HCT: 35.1 % — ABNORMAL LOW (ref 39.0–52.0)
HEMOGLOBIN: 13.4 g/dL (ref 13.0–17.0)
MCH: 32.4 pg (ref 26.0–34.0)
MCHC: 38.2 g/dL — AB (ref 30.0–36.0)
MCV: 85 fL (ref 78.0–100.0)
PLATELETS: 109 10*3/uL — AB (ref 150–400)
RBC: 4.13 MIL/uL — ABNORMAL LOW (ref 4.22–5.81)
RDW: 22.1 % — ABNORMAL HIGH (ref 11.5–15.5)
WBC: 5.4 10*3/uL (ref 4.0–10.5)

## 2015-09-15 LAB — BASIC METABOLIC PANEL
ANION GAP: 12 (ref 5–15)
BUN: 34 mg/dL — ABNORMAL HIGH (ref 6–20)
CALCIUM: 8.9 mg/dL (ref 8.9–10.3)
CO2: 22 mmol/L (ref 22–32)
CREATININE: 1.83 mg/dL — AB (ref 0.61–1.24)
Chloride: 102 mmol/L (ref 101–111)
GFR, EST AFRICAN AMERICAN: 42 mL/min — AB (ref >60)
GFR, EST NON AFRICAN AMERICAN: 37 mL/min — AB (ref >60)
GLUCOSE: 110 mg/dL — AB (ref 65–99)
Potassium: 4.1 mmol/L (ref 3.5–5.1)
Sodium: 136 mmol/L (ref 135–145)

## 2015-09-15 LAB — I-STAT TROPONIN, ED: Troponin i, poc: 0.01 ng/mL (ref 0.00–0.08)

## 2015-09-15 LAB — CBG MONITORING, ED: GLUCOSE-CAPILLARY: 98 mg/dL (ref 65–99)

## 2015-09-15 LAB — URINALYSIS, ROUTINE W REFLEX MICROSCOPIC
Glucose, UA: NEGATIVE mg/dL
KETONES UR: NEGATIVE mg/dL
NITRITE: POSITIVE — AB
PROTEIN: NEGATIVE mg/dL
SPECIFIC GRAVITY, URINE: 1.023 (ref 1.005–1.030)
pH: 5.5 (ref 5.0–8.0)

## 2015-09-15 LAB — I-STAT CG4 LACTIC ACID, ED: LACTIC ACID, VENOUS: 4.35 mmol/L — AB (ref 0.5–1.9)

## 2015-09-15 LAB — POCT INR: INR: 4.7

## 2015-09-15 LAB — PROTIME-INR
INR: 4.55 — AB (ref 0.00–1.49)
Prothrombin Time: 40.6 seconds — ABNORMAL HIGH (ref 11.6–15.2)

## 2015-09-15 MED ORDER — POTASSIUM CHLORIDE IN NACL 20-0.9 MEQ/L-% IV SOLN
INTRAVENOUS | Status: DC
  Administered 2015-09-15 – 2015-09-16 (×2): via INTRAVENOUS
  Filled 2015-09-15 (×3): qty 1000

## 2015-09-15 MED ORDER — SODIUM CHLORIDE 0.9 % IV SOLN: INTRAVENOUS | Status: DC

## 2015-09-15 MED ORDER — SODIUM CHLORIDE 0.9 % IV BOLUS (SEPSIS)
500.0000 mL | Freq: Once | INTRAVENOUS | Status: AC
  Administered 2015-09-15: 500 mL via INTRAVENOUS

## 2015-09-15 MED ORDER — ENSURE ENLIVE PO LIQD
237.0000 mL | Freq: Two times a day (BID) | ORAL | Status: DC
  Administered 2015-09-16 – 2015-09-19 (×6): 237 mL via ORAL

## 2015-09-15 MED ORDER — CEFTRIAXONE SODIUM 2 G IJ SOLR
2.0000 g | INTRAMUSCULAR | Status: DC
  Administered 2015-09-15 – 2015-09-18 (×4): 2 g via INTRAVENOUS
  Filled 2015-09-15 (×5): qty 2

## 2015-09-15 MED ORDER — ONDANSETRON HCL 4 MG PO TABS
4.0000 mg | ORAL_TABLET | Freq: Four times a day (QID) | ORAL | Status: DC | PRN
Start: 2015-09-15 — End: 2015-09-16

## 2015-09-15 MED ORDER — PROCHLORPERAZINE MALEATE 10 MG PO TABS
5.0000 mg | ORAL_TABLET | Freq: Four times a day (QID) | ORAL | Status: DC | PRN
Start: 2015-09-15 — End: 2015-09-21
  Filled 2015-09-15: qty 1

## 2015-09-15 MED ORDER — SENNOSIDES-DOCUSATE SODIUM 8.6-50 MG PO TABS
1.0000 | ORAL_TABLET | Freq: Every evening | ORAL | Status: DC | PRN
  Administered 2015-09-19: 1 via ORAL
  Filled 2015-09-15: qty 1

## 2015-09-15 MED ORDER — ONDANSETRON HCL 4 MG/2ML IJ SOLN: 4.0000 mg | Freq: Four times a day (QID) | INTRAMUSCULAR | Status: DC | PRN

## 2015-09-15 MED ORDER — DIPHENOXYLATE-ATROPINE 2.5-0.025 MG PO TABS: 1.0000 | ORAL_TABLET | Freq: Four times a day (QID) | ORAL | Status: DC | PRN

## 2015-09-15 MED ORDER — METOPROLOL SUCCINATE ER 50 MG PO TB24: 50.0000 mg | ORAL_TABLET | Freq: Every day | ORAL | Status: DC

## 2015-09-15 NOTE — Progress Notes (Signed)
Anti-Coagulation Progress Note  Jonathan Reed is a 70 y.o. male who is currently on an anti-coagulation regimen.    RECENT RESULTS: Recent results are below, the most recent result is correlated with a dose of 2.5mg  only for the past 5 days. INR still remains high I suspect due to the DDI potential for hypoprothrombinemic response brought about by his oral capacitabine (which he is currently in an "OFF" cycle having completed a 14 day "ON" cycle). Additionally, there is cited weight loss with potential of hypoalbunemia that due to warfarin being a highly protein bound drug--could be accounting for the sustained elevation in his INR. Have discussed with Dr. Marin Olp who indicates patient should report to the ED at [facility] for evaluation for any requirement for admission. Patient and family agrees to this plan.  Lab Results  Component Value Date   INR 4.70 08/17/2015   INR 5.20 09/11/2015   INR 3.5 09/04/2015   PROTIME 30.0* 05/15/2012    ANTI-COAG DOSE: Anticoagulation Dose Instructions as of 09/06/2015      [name] Tue Wed Thu Fri Sat   New Dose 0 mg 0 mg 0 mg 0 mg 2.5 mg 0 mg 0 mg    Description        Will continue to HOLD/OMIT daily doses. Going for evaluation at [facility] ED for requirement of possible admission. IF patient is NOT admitted, he will see me on Monday 3-JUL-17. IF he IS admitted--continue oversight and management of warfarin will be per inpatient pharmacy services at [facility].        ANTICOAG SUMMARY: Anticoagulation Episode Summary    Current INR goal 2.0-3.0  Next INR check 09/18/2015  INR from last check 4.70! (09/11/2015)  Weekly max dose   Target end date Indefinite  INR check location [facility]  Preferred lab   Send INR reminders to    Indications  Long term (current) use of anticoagulants [Z79.01] CVA (cerebral infarction) [I63.9] Chronic atrial fibrillation ([place]) [I48.2]        Comments         ANTICOAG TODAY: Anticoagulation Summary as of 08/17/2015    INR goal 2.0-3.0  Selected INR 4.70! (08/25/2015)  Next INR check 09/18/2015  Target end date Indefinite   Indications  Long term (current) use of anticoagulants [Z79.01] CVA (cerebral infarction) [I63.9] Chronic atrial fibrillation ([place]) [I48.2]      Anticoagulation Episode Summary    INR check location [facility]   Preferred lab    Send INR reminders to    Comments       PATIENT INSTRUCTIONS: Patient Instructions  Patient instructed to take medications as defined in the Anti-coagulation Track section of this encounter.  Patient instructed to OMIT today's dose and subsequent dosing until evaluated in ED at [facility] or by me on Monday 3-JUL-17.  Patient verbalized understanding of these instructions.       FOLLOW-UP Return in 3 days (on 09/18/2015) for Follow up INR at 0930h.  [name], III Pharm.D., CACP

## 2015-09-15 NOTE — ED Notes (Signed)
Jonathan Reed

## 2015-09-15 NOTE — Consult Note (Signed)
Referral MD  Reason for Referral: Hepatic failure. Metastatic neuroendocrine tumor to the liver   Chief Complaint  Patient presents with  . CA PT   . Weakness  . Dizziness  : I just am getting weaker.  HPI: Jonathan Reed is well-known to me. He is a 70 year old African-American male. He has a history of static neuroendocrine tumor. He has hepatic metastasis. We have done biopsies of the liver metastases. The last biopsy back in late March. This showed well-differentiated neuroendocrine tumors.  He is not tolerated any therapy that we have had him on. He recently has been on Xeloda/Temodar. He had been on this previously, probably 2 or 3 years ago, but now cannot tolerated in with a reduced dose.  He has massive hepatomegaly area and he certainly may have other reasons for hepatomegaly.  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 He does have atrial fibrillation. Has some element of cardiomyopathy.  He's been getting weaker over the past several days. Is not taking his chemotherapy pills for about 3 days.  He is on Coumadin. His Coumadin is being monitored by the Coumadin clinic. I actually was called by Dr.Groce about his weakened condition. I want him to go to the emergency room. He subsequently is being admitted.  He may have a urinary tract infection. A urinalysis did show positive nitrates. He had some white cells in the urine.  A CBC that was done on him showed a white count of 5.4. Hemoglobin 13.4 plaque at 109. He has had thrombocytopenia which has been a lot more severe.  His BUN was 34 creatinine 1.83.  His lactic acid was 4.35. His INR was 4.55.  Liver function tests and not been done yet.  A chest x-ray has been done and was unremarkable for any obvious pneumonia, pulmonary edema, infiltrate.  He is not eating. He has no appetite. We have tried everything to try to get him to eat. And nothing really has helped.  He has had some diarrhea in the past. We had him on some  Lomotil.  He is weak in his legs.  He's had no bleeding.  He has had no obvious fever.    Past Medical History  Diagnosis Date  . CHF (congestive heart failure) ([place])     with ICD (f/u with [facility])  . CVA (cerebral infarction) 2011    No residual Sx  . CAD (coronary artery disease) 2000  . HTN (hypertension)   . Nausea alone 01/06/2013  . Abdominal pain, unspecified site 01/06/2013  . Stroke ([place])   . Tooth ache 01/04/2014  . Bronchitis 09/23/2014  . Gout   . Carcinoid syndrome ([place])   . Metastatic malignant neuroendocrine tumor to liver ([place])   . Metastasis to liver [facility]) 10/06/2013  :  Past Surgical History  Procedure Laterality Date  . Cardiac defibrillator placement    . Inguinal hernia repair Bilateral   . Sinus surgery with instatrak    . Colonoscopy  1980    colon polyps, no f/u since.   :   Current facility-administered medications:  .  0.9 %  sodium chloride infusion, , Intravenous, STAT, Leonard Schwartz, MD .  0.9 % NaCl with KCl 20 mEq/ L  infusion, , Intravenous, Continuous, Mir Marry Guan, MD, Last Rate: 100 mL/hr at 08/29/2015 1709 .  cefTRIAXone (ROCEPHIN) 2 g in dextrose 5 % 50 mL IVPB, 2 g, Intravenous, Q24H, Mir Marry Guan, MD .  diphenoxylate-atropine (LOMOTIL) 2.5-0.025 MG per tablet 1 tablet, 1  tablet, Oral, QID PRN, Mir Marry Guan, MD .  Derrill Memo ON 09/16/2015] feeding supplement (ENSURE ENLIVE) (ENSURE ENLIVE) liquid 237 mL, 237 mL, Oral, BID BM, Mir Marry Guan, MD .  ondansetron [facility]) tablet 4 mg, 4 mg, Oral, Q6H PRN **OR** ondansetron (ZOFRAN) injection 4 mg, 4 mg, Intravenous, Q6H PRN, Mir Marry Guan, MD .  prochlorperazine (COMPAZINE) tablet 5 mg, 5 mg, Oral, Q6H PRN, Mir Marry Guan, MD .  senna-docusate (Senokot-S) tablet 1 tablet, 1 tablet, Oral, QHS PRN, Mir Marry Guan, MD:  . sodium chloride   Intravenous STAT  . cefTRIAXone (ROCEPHIN)  IV  2 g Intravenous Q24H  . [START ON 09/16/2015]  feeding supplement (ENSURE ENLIVE)  237 mL Oral BID BM  :  No Known Allergies:  Family History  Problem Relation Age of Onset  . Stroke Mother   . Diabetes Father     diabetic coma  . Diabetes Sister   . Cancer Sister     intestinal ca?  . Prostate cancer Brother     prostate  . Breast cancer Maternal Aunt   . Clotting disorder Brother   . Heart disease Brother   . Kidney disease Brother   . Stroke Sister   . Hypertension Daughter   :  Social History   Social History  . Marital Status: Married    Spouse Name: N/A  . Number of Children: 2  . Years of Education: N/A   Occupational History  .      refurnishing furniture   Social History Main Topics  . Smoking status: Never Smoker   . Smokeless tobacco: Never Used  . Alcohol Use: No  . Drug Use: No  . Sexual Activity: Not Currently   Other Topics Concern  . Not on file   Social History Narrative  :  Pertinent items are noted in HPI.  Exam: Patient Vitals for the past 24 hrs:  BP Temp Temp src Pulse Resp SpO2 Height Weight  08/27/2015 1700 (!) 105/53 mmHg - - 77 (!) 23 98 % 5\' 11"  (1.803 m) 157 lb (71.215 kg)  08/26/2015 1600 92/67 mmHg - - (!) 32 22 97 % - -  08/23/2015 1531 98/62 mmHg - - 72 22 95 % - -  08/18/2015 1406 95/73 mmHg - - 75 20 95 % - -  08/24/2015 1330 109/75 mmHg - - - 22 - - -  09/03/2015 1237 93/68 mmHg - - 70 20 96 % - -  09/07/2015 1200 102/74 mmHg - - (!) 30 22 (!) 85 % - -  09/08/2015 1148 94/66 mmHg - - 85 17 96 % - -  09/10/2015 1031 96/64 mmHg 97.8 F (36.6 C) Oral (!) 42 20 96 % - 157 lb (71.215 kg)   A cachectic appearing, chronically ill-appearing African-American male in no obvious distress. He is alert and oriented. Head and neck exam shows marked scleral icterus. He has very dry oral mucosa. There is no adenopathy in the neck. Lungs are with some decreased breath sounds at the bases. Cardiac exam regular rate and rhythm. He has a 1/6 systolic ejection murmur. Abdomen is slightly distended with  hepatomegaly. There is no obvious fluid wave. His spleen tip is palpable at the left costal margin. His liver is about 7 cm below the right costal margin. Extremities show some trace edema in his legs.    Recent Labs  09/06/2015 1123  WBC 5.4  HGB 13.4  HCT 35.1*  PLT 109*    Recent  Labs  09/03/2015 1123  NA 136  K 4.1  CL 102  CO2 22  GLUCOSE 110*  BUN 34*  CREATININE 1.83*  CALCIUM 8.9    Blood smear review:  None  Pathology: None     Assessment and Plan:  Jonathan Reed is a very nice 70 year old African-American male. He has hepatomegaly. He has metastatic neuroendocrine carcinoma. He has tried multiple therapies. He is intolerant of therapy at this point.  I think it is apparent that he is developing liver failure. Even though his liver tests are not back yet, I would have to think that his bilirubin is over 10.  I really believe that his prognosis is clearly dictated by his hepatic insufficiency. It is unusual for metastatic neuroendocrine carcinoma to do this. Again he has not had any conversion to a more high-grade neuroendocrine cancer.  I did speak with Mr. Grande. His nurse was with [place]. I talked to him about his situation and end-of-life issues. I told him about what I felt was his prognosis. I really think that the next 2 or 3 days will be critical.  I think that if he were to go on life support, he would never come off because he is so weak. I asked him if he wanted to be on life support. He says he does not want to Live on machines. I totally agree with this. As such, he is a DO NOT RESUSCITATE.  I spoke to his family at length. I explained to them what I thought was going on. I think that his liver is failing him. It is possible that he may have an infection that can be reversed and his liver may improve. However, I think this would be unlikely. They understand the situation that he is in. They also know that he does not want to be kept alive on machines.  I think he  probably will need a PICC line. He has poor IV access.  I will order a prealbumin and ammonia level on him. I think this may be helpful.  Mr. Westermann is really nice. He has a very interesting job. I just wish he could do what he would like to do.  We will follow along. I appreciate all the great care that he is getting from everybody up on [address].   [place] 34:8

## 2015-09-15 NOTE — ED Notes (Signed)
Pt can go to floor at 15:19

## 2015-09-15 NOTE — ED Notes (Signed)
Pt c/o increasing weakness and dizziness x 3 weeks. Denies pain.  Denies n/v/d.  [facility] and completed 14 days oral chemo x 2 days ago.  Hx of liver CA.

## 2015-09-15 NOTE — Patient Instructions (Signed)
Patient instructed to take medications as defined in the Anti-coagulation Track section of this encounter.  Patient instructed to OMIT today's dose and subsequent dosing until evaluated in ED at [facility] or by me on Monday 3-JUL-17.  Patient verbalized understanding of these instructions.

## 2015-09-15 NOTE — Progress Notes (Signed)
Reviewed Agree with plan to hold warfarin & proceed to [facility] ED for further eval. Thanks DrG

## 2015-09-15 NOTE — ED Provider Notes (Signed)
CSN: [id number]     Arrival date & time 09/09/2015  1013 History   First MD Initiated Contact with Patient 09/02/2015 1046     Chief Complaint  Patient presents with  . CA PT   . Weakness  . Dizziness     HPI Patient presents with increasing weakness and dizziness for the last 2-3 weeks.  Patient has history of advanced cancer.  He completed 14 days of chemotherapy 2 days ago.  Denies fever chills. Past Medical History  Diagnosis Date  . CHF (congestive heart failure) ([place])     with ICD (f/u with [facility])  . CVA (cerebral infarction) 2011    No residual Sx  . CAD (coronary artery disease) 2000  . HTN (hypertension)   . Nausea alone 01/06/2013  . Abdominal pain, unspecified site 01/06/2013  . Stroke ([place])   . Tooth ache 01/04/2014  . Bronchitis 09/23/2014  . Gout   . Carcinoid syndrome ([place])   . Metastatic malignant neuroendocrine tumor to liver ([place])   . Metastasis to liver [facility]) 10/06/2013   Past Surgical History  Procedure Laterality Date  . Cardiac defibrillator placement    . Inguinal hernia repair Bilateral   . Sinus surgery with instatrak    . Colonoscopy  1980    colon polyps, no f/u since.    Family History  Problem Relation Age of Onset  . Stroke Mother   . Diabetes Father     diabetic coma  . Diabetes Sister   . Cancer Sister     intestinal ca?  . Prostate cancer Brother     prostate  . Breast cancer Maternal Aunt   . Clotting disorder Brother   . Heart disease Brother   . Kidney disease Brother   . Stroke Sister   . Hypertension Daughter    Social History  Substance Use Topics  . Smoking status: Never Smoker   . Smokeless tobacco: Never Used  . Alcohol Use: No    Review of Systems All other systems reviewed and are negative   Allergies  Review of patient's allergies indicates no known allergies.  Home Medications   Prior to Admission medications   Medication Sig Start Date End Date Taking? Authorizing Provider  capecitabine (XELODA) 150 MG  tablet Take 2 pills twice a day for 14 days Patient taking differently: Take 300 mg by mouth 2 (two) times daily after a meal. Takes a total of 1300mg  twice a day for 14 days then 14 days off. 08/25/15  Yes Volanda Napoleon, MD  capecitabine (XELODA) 500 MG tablet Take 2 pills twice a day for 14 days every 21 days Patient taking differently: Take 1,000 mg by mouth 2 (two) times daily after a meal. Takes a total of 1300mg  twice a day for 14 days then 14 days off. 08/25/15  Yes Volanda Napoleon, MD  colchicine (COLCRYS) 0.6 MG tablet Take 1 tablet (0.6 mg total) by mouth as needed (For acute gout flare up.). Take 2 tabs (1.2 mg) then 1 tab (0.6 mg) one hour later. 07/14/15  Yes Volanda Napoleon, MD  diphenoxylate-atropine (LOMOTIL) 2.5-0.025 MG tablet Take 1 tablet by mouth 4 (four) times daily as needed for diarrhea or loose stools. 07/06/15  Yes Volanda Napoleon, MD  furosemide (LASIX) 20 MG tablet Take 2 tablets (40 mg total) by mouth daily. Patient taking differently: Take 40 mg by mouth every other day.  07/07/15  Yes Volanda Napoleon, MD  metoprolol succinate (TOPROL-XL) 50  MG 24 hr tablet Take 1 tablet (50 mg total) by mouth daily. Take with or immediately following a meal. Patient taking differently: Take 100 mg by mouth daily. Take with or immediately following a meal. 06/29/13  Yes Blain Pais, MD  ondansetron (ZOFRAN) 8 MG tablet Take 1 tablet (8 mg total) by mouth 2 (two) times daily. 08/25/15  Yes Volanda Napoleon, MD  prochlorperazine (COMPAZINE) 5 MG tablet TAKE 1 TABLET BY MOUTH EVERY 6 HOURS AS NEEDED FOR NAUSEA OR VOMITING 08/25/15  Yes Volanda Napoleon, MD  temozolomide (TEMODAR) 100 MG capsule Take 3 capsules a day for 4 days only every 21 days Patient taking differently: Take 300 mg by mouth daily. Takes 300mg  daily for 4 days only every 28 days. Coincides with days 11-14 of capecitabine course. 08/25/15  Yes Volanda Napoleon, MD  warfarin (COUMADIN) 5 MG tablet Take 1/2 tablet on  Mondays/Thursdays; take 1 tablet all other days. Patient taking differently: Take 2.5 mg by mouth every Thursday.  01/09/15  Yes Oval Linsey, MD   BP 98/66 mmHg  Pulse 68  Temp(Src) 98.6 F (37 C) (Oral)  Resp 18  Ht 5\' 11"  (1.803 m)  Wt 173 lb 1.6 oz (78.518 kg)  BMI 24.15 kg/m2  SpO2 96% Physical Exam Physical Exam  Nursing note and vitals reviewed. Constitutional: He is oriented to person, place, and time.  Patient appears cachectic and malnourished.Marland Kitchen  HENT:  Head: Normocephalic and atraumatic.  Eyes: Pupils are equal, round, and reactive to light.  Neck: Normal range of motion.  Cardiovascular: Normal rate and intact distal pulses.   Pulmonary/Chest: No respiratory distress.  Abdominal: Normal appearance. He exhibits distention secondary to ascites. Musculoskeletal: Normal range of motion.  Neurological: He is alert and oriented to person, place, and time. No cranial nerve deficit.  patient has generalized muscular weakness and muscle wasting. Skin: Skin is warm and dry. No rash noted.  Psychiatric: He has a normal mood and affect. His behavior is normal.   ED Course  Procedures (including critical care time) Labs Review Labs Reviewed  URINE CULTURE - Abnormal; Notable for the following:    Culture 20,000 COLONIES/mL ESCHERICHIA COLI (*)    Organism ID, Bacteria ESCHERICHIA COLI (*)    All other components within normal limits  BASIC METABOLIC PANEL - Abnormal; Notable for the following:    Glucose, Bld 110 (*)    BUN 34 (*)    Creatinine, Ser 1.83 (*)    GFR calc non Af Amer 37 (*)    GFR calc Af Amer 42 (*)    All other components within normal limits  CBC - Abnormal; Notable for the following:    RBC 4.13 (*)    HCT 35.1 (*)    MCHC 38.2 (*)    RDW 22.1 (*)    Platelets 109 (*)    All other components within normal limits  URINALYSIS, ROUTINE W REFLEX MICROSCOPIC (NOT AT [facility]) - Abnormal; Notable for the following:    Color, Urine ORANGE (*)    APPearance  CLOUDY (*)    Hgb urine dipstick SMALL (*)    Bilirubin Urine LARGE (*)    Nitrite POSITIVE (*)    Leukocytes, UA SMALL (*)    All other components within normal limits  PROTIME-INR - Abnormal; Notable for the following:    Prothrombin Time 40.6 (*)    INR 4.55 (*)    All other components within normal limits  URINE MICROSCOPIC-ADD ON -  Abnormal; Notable for the following:    Squamous Epithelial / LPF 0-5 (*)    Bacteria, UA FEW (*)    Casts HYALINE CASTS (*)    All other components within normal limits  CBC - Abnormal; Notable for the following:    RBC 3.76 (*)    Hemoglobin 12.4 (*)    HCT 32.3 (*)    MCHC 37.8 (*)    RDW 22.4 (*)    Platelets 124 (*)    All other components within normal limits  PROTIME-INR - Abnormal; Notable for the following:    Prothrombin Time 45.1 (*)    INR 5.23 (*)    All other components within normal limits  BASIC METABOLIC PANEL - Abnormal; Notable for the following:    Glucose, Bld 111 (*)    BUN 37 (*)    Creatinine, Ser 1.91 (*)    Calcium 8.6 (*)    GFR calc non Af Amer 35 (*)    GFR calc Af Amer 40 (*)    All other components within normal limits  PREALBUMIN - Abnormal; Notable for the following:    Prealbumin 14.3 (*)    All other components within normal limits  AMMONIA - Abnormal; Notable for the following:    Ammonia 81 (*)    All other components within normal limits  LACTIC ACID, PLASMA - Abnormal; Notable for the following:    Lactic Acid, Venous 4.2 (*)    All other components within normal limits  HEPATIC FUNCTION PANEL - Abnormal; Notable for the following:    Total Protein 4.5 (*)    Albumin 1.6 (*)    AST 64 (*)    Alkaline Phosphatase 237 (*)    Total Bilirubin 14.9 (*)    Bilirubin, Direct 8.7 (*)    Indirect Bilirubin 6.2 (*)    All other components within normal limits  CBC WITH DIFFERENTIAL/PLATELET - Abnormal; Notable for the following:    RBC 3.76 (*)    Hemoglobin 12.3 (*)    HCT 32.5 (*)    MCHC 37.8 (*)     RDW 23.3 (*)    Platelets 94 (*)    All other components within normal limits  COMPREHENSIVE METABOLIC PANEL - Abnormal; Notable for the following:    Glucose, Bld 106 (*)    BUN 36 (*)    Creatinine, Ser 1.72 (*)    Calcium 8.7 (*)    Total Protein 4.6 (*)    Albumin 1.7 (*)    AST 73 (*)    Alkaline Phosphatase 243 (*)    Total Bilirubin 17.2 (*)    GFR calc non Af Amer 39 (*)    GFR calc Af Amer 46 (*)    All other components within normal limits  PROTIME-INR - Abnormal; Notable for the following:    Prothrombin Time 24.0 (*)    INR 2.26 (*)    All other components within normal limits  PROTIME-INR - Abnormal; Notable for the following:    Prothrombin Time 22.3 (*)    INR 2.05 (*)    All other components within normal limits  AMMONIA - Abnormal; Notable for the following:    Ammonia 51 (*)    All other components within normal limits  PREALBUMIN - Abnormal; Notable for the following:    Prealbumin 14.6 (*)    All other components within normal limits  I-STAT CG4 LACTIC ACID, ED - Abnormal; Notable for the following:    Lactic Acid, Venous  4.35 (*)    All other components within normal limits  LACTIC ACID, PLASMA  CBG MONITORING, ED  I-STAT TROPOININ, ED  I-STAT CG4 LACTIC ACID, ED    Imaging Review US Abdomen Limited  09/17/2015  CLINICAL DATA:  History of metastatic neuroendocrine tumor to the liver. Limited ultrasound done prior to possible paracentesis. EXAM: LIMITED ABDOMEN ULTRASOUND FOR ASCITES TECHNIQUE: Limited ultrasound survey for ascites was performed in all four abdominal quadrants. COMPARISON:  None. FINDINGS: Limited ultrasound survey for ascites was performed in all four abdominal quadrants. There is a minimal amount of peritoneal fluid. There is no significant pocket of fluid to allow for a safe approach for paracentesis IMPRESSION: Minimal ascites. Read by:  Gareth Eagle, PA-C Electronically Signed   By: Lucrezia Europe M.D.   On: 09/17/2015 11:40   I have  personally reviewed and evaluated these images and lab results as part of my medical decision-making.   EKG Interpretation   Date/Time:  Friday September 15 2015 10:31:03 EDT Ventricular Rate:  77 PR Interval:    QRS Duration: 153 QT Interval:  517 QTC Calculation: 651 R Axis:   -107 Text Interpretation:  Atrial-paced complexes IVCD, consider atypical RBBB  Inferior infarct, old Probable anterolateral infarct, age indeterm  Abnormal ekg Confirmed by Audie Pinto  MD, Kalissa Grays ([id number]) on 09/11/2015 11:38:48  AM      MDM   Final diagnoses:  Ascites  Dehydration        Leonard Schwartz, MD 09/18/15 1515

## 2015-09-15 NOTE — Progress Notes (Signed)
EDCM spoke to patient and family at bedside.  Patient lives at home with his wife Jonathan Reed [phone].  Patient has never had home health services in the past.  Patient does not have any equipment at home.  When asked if patient can perform his ADL's independently, patient's son in law reports he needs assistance with that.  Patient reports his pcp Dr. Merrilyn Puma moved to [facility].  Patient's daughter Jonathan Reed [phone] reports patient has been seen at the [facility] Internal medicine clinic.  She reports patient gets his coumadin levels checked by Dr. Elie Confer. Jonathan Reed also reports that patient needs PT.  Patient is seen also by Dr. Marin Olp.  Patient's family is very interested in having home health services for the patient.  EDCM provided patient's wife and patient's POA with list of home health agencies in [facility], explained services.  Informed patient and family that home health agency of their choice has 24-48 hours to contact them.  Patient's family also inquiring about meals on wheels.  Informed patient's family that since patient has someone who is capable of bringing patient meals (wife), he may not qualify for meals on wheels.  Patient and family thankful for resources.  Patient's son in law spoke to [facility] outside of the room.  He reports patient's daughter Jonathan Reed is patient's POA, also his other daughter Jonathan Reed is the patient's POA.  Jonathan Reed is to be contacted primarily.  He reports they have asked about meals on wheels because patient's wife is unable to "handle the situation."  He reports she does not have a driver's license.  EDCM provided patient's daughter Jonathan Reed with the contact information for the [facility] [place] to inquire about mobile meals.  Patient's family thankful for resources.  No further EDCM needs at this time.

## 2015-09-15 NOTE — H&P (Signed)
History and Physical  Jonathan Reed [id number] DOB: 08/18/1945 DOA: 08/27/2015   PCP: Fatima Sanger, MD   Patient coming from: Home   Chief Complaint: weakness   HPI: Jonathan Reed is a 70 y.o. male with medical history significant for metastatic neuroendocrine tumor with innumerable liver mets who is being admitted due to lactic acidosis, dehydration and weakness. History is provided by the patient, his two daughters and his wife. They tell me he has been weak chronically for the last month or so, worsening daily. He also has chronic LE edema that is worsening, and he has recurrent abdominal swelling. He has had a paracentesis before.  They also note worsening SOB, he has PRN O2 at home and had to use it the last two nights. He also started to develop scleral icterus in the last couple of days.  ED Course: Found to have elevated lactate as well as dehydration with elevated Cr. [facility] asked to admit.  Review of Systems: Please see HPI for pertinent positives and negatives. A complete 10 system review of systems are otherwise negative.  Past Medical History  Diagnosis Date  . CHF (congestive heart failure) ([place])     with ICD (f/u with [facility])  . CVA (cerebral infarction) 2011    No residual Sx  . CAD (coronary artery disease) 2000  . HTN (hypertension)   . Nausea alone 01/06/2013  . Abdominal pain, unspecified site 01/06/2013  . Stroke ([place])   . Tooth ache 01/04/2014  . Bronchitis 09/23/2014  . Gout   . Carcinoid syndrome ([place])   . Metastatic malignant neuroendocrine tumor to liver ([place])   . Metastasis to liver [facility]) 10/06/2013   Past Surgical History  Procedure Laterality Date  . Cardiac defibrillator placement    . Inguinal hernia repair Bilateral   . Sinus surgery with instatrak    . Colonoscopy  1980    colon polyps, no f/u since.     Social History:  reports that he has never smoked. He has never used smokeless tobacco. He reports that he does not drink alcohol or use  illicit drugs.   No Known Allergies  Family History  Problem Relation Age of Onset  . Stroke Mother   . Diabetes Father     diabetic coma  . Diabetes Sister   . Cancer Sister     intestinal ca?  . Prostate cancer Brother     prostate  . Breast cancer Maternal Aunt   . Clotting disorder Brother   . Heart disease Brother   . Kidney disease Brother   . Stroke Sister   . Hypertension Daughter      Prior to Admission medications   Medication Sig Start Date End Date Taking? Authorizing Provider  capecitabine (XELODA) 150 MG tablet Take 2 pills twice a day for 14 days Patient taking differently: Take 300 mg by mouth 2 (two) times daily after a meal. Takes a total of 1300mg  twice a day for 14 days then 14 days off. 08/25/15  Yes Volanda Napoleon, MD  capecitabine (XELODA) 500 MG tablet Take 2 pills twice a day for 14 days every 21 days Patient taking differently: Take 1,000 mg by mouth 2 (two) times daily after a meal. Takes a total of 1300mg  twice a day for 14 days then 14 days off. 08/25/15  Yes Volanda Napoleon, MD  colchicine (COLCRYS) 0.6 MG tablet Take 1 tablet (0.6 mg total) by mouth as needed (For acute gout flare up.).  Take 2 tabs (1.2 mg) then 1 tab (0.6 mg) one hour later. 07/14/15  Yes Volanda Napoleon, MD  diphenoxylate-atropine (LOMOTIL) 2.5-0.025 MG tablet Take 1 tablet by mouth 4 (four) times daily as needed for diarrhea or loose stools. 07/06/15  Yes Volanda Napoleon, MD  furosemide (LASIX) 20 MG tablet Take 2 tablets (40 mg total) by mouth daily. Patient taking differently: Take 40 mg by mouth every other day.  07/07/15  Yes Volanda Napoleon, MD  metoprolol succinate (TOPROL-XL) 50 MG 24 hr tablet Take 1 tablet (50 mg total) by mouth daily. Take with or immediately following a meal. Patient taking differently: Take 100 mg by mouth daily. Take with or immediately following a meal. 06/29/13  Yes Blain Pais, MD  ondansetron (ZOFRAN) 8 MG tablet Take 1 tablet (8 mg total) by  mouth 2 (two) times daily. 08/25/15  Yes Volanda Napoleon, MD  prochlorperazine (COMPAZINE) 5 MG tablet TAKE 1 TABLET BY MOUTH EVERY 6 HOURS AS NEEDED FOR NAUSEA OR VOMITING 08/25/15  Yes Volanda Napoleon, MD  temozolomide (TEMODAR) 100 MG capsule Take 3 capsules a day for 4 days only every 21 days Patient taking differently: Take 300 mg by mouth daily. Takes 300mg  daily for 4 days only every 28 days. Coincides with days 11-14 of capecitabine course. 08/25/15  Yes Volanda Napoleon, MD  warfarin (COUMADIN) 5 MG tablet Take 1/2 tablet on Mondays/Thursdays; take 1 tablet all other days. Patient taking differently: Take 2.5 mg by mouth every Thursday.  01/09/15  Yes Oval Linsey, MD    Physical Exam: BP 95/73 mmHg  Pulse 75  Temp(Src) 97.8 F (36.6 C) (Oral)  Resp 20  Wt 71.215 kg (157 lb)  SpO2 95%  General:  Alert, oriented, calm, tired and frail appearing Eyes: pupils round and reactive to light and accomodation, icteric sclerea Neck: supple, no masses, trachea mildline  Cardiovascular: RRR, no murmurs or rubs, 2+ LE peripheral edema right greater than left. Respiratory: clear to auscultation bilaterally, no wheezes, no crackles  Abdomen: soft, nontender, distended, normal bowel tones heard  Skin: dry, no rashes  Musculoskeletal: no joint effusions, normal range of motion  Psychiatric: appropriate affect, normal speech  Neurologic: extraocular muscles intact, clear speech, moving all extremities with intact sensorium            Labs on Admission:  Basic Metabolic Panel:  Recent Labs Lab 08/17/2015 1123  NA 136  K 4.1  CL 102  CO2 22  GLUCOSE 110*  BUN 34*  CREATININE 1.83*  CALCIUM 8.9   Liver Function Tests: No results for input(s): AST, ALT, ALKPHOS, BILITOT, PROT, ALBUMIN in the last 168 hours. No results for input(s): LIPASE, AMYLASE in the last 168 hours. No results for input(s): AMMONIA in the last 168 hours. CBC:  Recent Labs Lab 08/22/2015 1123  WBC 5.4  HGB 13.4    HCT 35.1*  MCV 85.0  PLT 109*   Cardiac Enzymes: No results for input(s): CKTOTAL, CKMB, CKMBINDEX, TROPONINI in the last 168 hours.  BNP (last 3 results) No results for input(s): BNP in the last 8760 hours.  ProBNP (last 3 results) No results for input(s): PROBNP in the last 8760 hours.  CBG:  Recent Labs Lab 09/07/2015 1057  GLUCAP 98    Radiological Exams on Admission: Dg Chest Portable 1 View  08/18/2015  CLINICAL DATA:  Increasing shortness of breath over the last 3 weeks, history of known liver carcinoma EXAM: PORTABLE CHEST 1 VIEW  COMPARISON:  06/01/2014 FINDINGS: Cardiac shadow is stable. A defibrillator is again noted. Elevation of the right hemidiaphragm is noted with right basilar atelectatic changes. Small effusion cannot be totally excluded. No bony abnormality is noted. IMPRESSION: Right basilar atelectasis/infiltrate with likely small effusion. Electronically Signed   By: Inez Catalina M.D.   On: 09/09/2015 12:09   Assessment/Plan Present on Admission:  . Dehydration  - gentle IV fluids of 100 cc/hr over the next 12 hours, he is s/p 500cc bolus in ER - I suspect lactate is from liver dysfunction, though with low BP cannot rule out infection - will give IVF as above and recheck lactate in a few hours - note he has no leukocytosis Ascites - no pain, no fever or WBC, but slight confusion per family. As such, will treat with empiric rocephin in case of SBP and have ordered IR paracentesis, this may need to be delayed until INR is improved. Consider VitK if not improving in AM.   Ischemic cardiomyopathy with history of Vtach - caution with IVF given HF.   Metastatic malignant neuroendocrine tumor to liver [facility]) - just completed 14 day chemo course due to advancing disease seen on CT in early June   Hypertension and CAD (coronary artery disease) - currently hypotensive, will hold home beta blocker for now   Dehydration and AKI - with elevated BUN/Cr, likely due to poor PO  intake   DVT prophylaxis: INR elevated   Code Status: FULL   Family Communication: D/W family in ER.   Disposition Plan: Likely to home, may need PT. PT consulted.   Consults called: None   Admission status: Obs Tele   Time spent: 62 minutes  Reathel Turi Marry Guan MD [facility] Pager [phone]  If 7PM-7AM, please contact night-coverage [email or web address] Password [email or web address]  09/13/2015, 2:35 PM

## 2015-09-16 ENCOUNTER — Observation Stay (HOSPITAL_COMMUNITY): Payer: Medicare Other

## 2015-09-16 DIAGNOSIS — Z6821 Body mass index (BMI) 21.0-21.9, adult: Secondary | ICD-10-CM | POA: Diagnosis not present

## 2015-09-16 DIAGNOSIS — I2510 Atherosclerotic heart disease of native coronary artery without angina pectoris: Secondary | ICD-10-CM | POA: Diagnosis present

## 2015-09-16 DIAGNOSIS — Z7901 Long term (current) use of anticoagulants: Secondary | ICD-10-CM | POA: Diagnosis not present

## 2015-09-16 DIAGNOSIS — I482 Chronic atrial fibrillation, unspecified: Secondary | ICD-10-CM | POA: Diagnosis present

## 2015-09-16 DIAGNOSIS — C7A8 Other malignant neuroendocrine tumors: Secondary | ICD-10-CM | POA: Diagnosis present

## 2015-09-16 DIAGNOSIS — Z66 Do not resuscitate: Secondary | ICD-10-CM | POA: Diagnosis present

## 2015-09-16 DIAGNOSIS — R188 Other ascites: Secondary | ICD-10-CM | POA: Diagnosis present

## 2015-09-16 DIAGNOSIS — I255 Ischemic cardiomyopathy: Secondary | ICD-10-CM | POA: Diagnosis not present

## 2015-09-16 DIAGNOSIS — E875 Hyperkalemia: Secondary | ICD-10-CM | POA: Diagnosis present

## 2015-09-16 DIAGNOSIS — E872 Acidosis, unspecified: Secondary | ICD-10-CM

## 2015-09-16 DIAGNOSIS — D696 Thrombocytopenia, unspecified: Secondary | ICD-10-CM | POA: Diagnosis present

## 2015-09-16 DIAGNOSIS — K7200 Acute and subacute hepatic failure without coma: Secondary | ICD-10-CM | POA: Diagnosis not present

## 2015-09-16 DIAGNOSIS — I110 Hypertensive heart disease with heart failure: Secondary | ICD-10-CM | POA: Diagnosis present

## 2015-09-16 DIAGNOSIS — M109 Gout, unspecified: Secondary | ICD-10-CM | POA: Diagnosis present

## 2015-09-16 DIAGNOSIS — I509 Heart failure, unspecified: Secondary | ICD-10-CM | POA: Diagnosis present

## 2015-09-16 DIAGNOSIS — E43 Unspecified severe protein-calorie malnutrition: Secondary | ICD-10-CM | POA: Diagnosis present

## 2015-09-16 DIAGNOSIS — E162 Hypoglycemia, unspecified: Secondary | ICD-10-CM | POA: Diagnosis not present

## 2015-09-16 DIAGNOSIS — C7B02 Secondary carcinoid tumors of liver: Principal | ICD-10-CM

## 2015-09-16 DIAGNOSIS — I10 Essential (primary) hypertension: Secondary | ICD-10-CM | POA: Diagnosis not present

## 2015-09-16 DIAGNOSIS — Z95810 Presence of automatic (implantable) cardiac defibrillator: Secondary | ICD-10-CM | POA: Diagnosis not present

## 2015-09-16 DIAGNOSIS — I472 Ventricular tachycardia: Secondary | ICD-10-CM | POA: Diagnosis present

## 2015-09-16 DIAGNOSIS — N390 Urinary tract infection, site not specified: Secondary | ICD-10-CM | POA: Diagnosis present

## 2015-09-16 DIAGNOSIS — B9620 Unspecified Escherichia coli [E. coli] as the cause of diseases classified elsewhere: Secondary | ICD-10-CM | POA: Diagnosis not present

## 2015-09-16 DIAGNOSIS — N179 Acute kidney failure, unspecified: Secondary | ICD-10-CM | POA: Diagnosis not present

## 2015-09-16 DIAGNOSIS — K767 Hepatorenal syndrome: Secondary | ICD-10-CM | POA: Diagnosis not present

## 2015-09-16 DIAGNOSIS — Z8673 Personal history of transient ischemic attack (TIA), and cerebral infarction without residual deficits: Secondary | ICD-10-CM | POA: Diagnosis not present

## 2015-09-16 DIAGNOSIS — I959 Hypotension, unspecified: Secondary | ICD-10-CM | POA: Diagnosis present

## 2015-09-16 DIAGNOSIS — K7290 Hepatic failure, unspecified without coma: Secondary | ICD-10-CM | POA: Diagnosis not present

## 2015-09-16 DIAGNOSIS — R531 Weakness: Secondary | ICD-10-CM | POA: Diagnosis present

## 2015-09-16 DIAGNOSIS — C7B8 Other secondary neuroendocrine tumors: Secondary | ICD-10-CM | POA: Diagnosis not present

## 2015-09-16 DIAGNOSIS — R451 Restlessness and agitation: Secondary | ICD-10-CM | POA: Diagnosis not present

## 2015-09-16 DIAGNOSIS — R160 Hepatomegaly, not elsewhere classified: Secondary | ICD-10-CM | POA: Diagnosis present

## 2015-09-16 LAB — HEPATIC FUNCTION PANEL
ALT: 42 U/L (ref 17–63)
AST: 64 U/L — ABNORMAL HIGH (ref 15–41)
Albumin: 1.6 g/dL — ABNORMAL LOW (ref 3.5–5.0)
Alkaline Phosphatase: 237 U/L — ABNORMAL HIGH (ref 38–126)
BILIRUBIN DIRECT: 8.7 mg/dL — AB (ref 0.1–0.5)
BILIRUBIN INDIRECT: 6.2 mg/dL — AB (ref 0.3–0.9)
TOTAL PROTEIN: 4.5 g/dL — AB (ref 6.5–8.1)
Total Bilirubin: 14.9 mg/dL — ABNORMAL HIGH (ref 0.3–1.2)

## 2015-09-16 LAB — BASIC METABOLIC PANEL
ANION GAP: 9 (ref 5–15)
BUN: 37 mg/dL — ABNORMAL HIGH (ref 6–20)
CALCIUM: 8.6 mg/dL — AB (ref 8.9–10.3)
CHLORIDE: 103 mmol/L (ref 101–111)
CO2: 25 mmol/L (ref 22–32)
Creatinine, Ser: 1.91 mg/dL — ABNORMAL HIGH (ref 0.61–1.24)
GFR calc non Af Amer: 35 mL/min — ABNORMAL LOW (ref >60)
GFR, EST AFRICAN AMERICAN: 40 mL/min — AB (ref >60)
Glucose, Bld: 111 mg/dL — ABNORMAL HIGH (ref 65–99)
Potassium: 4.4 mmol/L (ref 3.5–5.1)
Sodium: 137 mmol/L (ref 135–145)

## 2015-09-16 LAB — CBC
HEMATOCRIT: 32.3 % — AB (ref 39.0–52.0)
HEMOGLOBIN: 12.4 g/dL — AB (ref 13.0–17.0)
MCH: 32.4 pg (ref 26.0–34.0)
MCHC: 37.8 g/dL — ABNORMAL HIGH (ref 30.0–36.0)
MCV: 85.9 fL (ref 78.0–100.0)
Platelets: 124 10*3/uL — ABNORMAL LOW (ref 150–400)
RBC: 3.76 MIL/uL — AB (ref 4.22–5.81)
RDW: 22.4 % — ABNORMAL HIGH (ref 11.5–15.5)
WBC: 6.1 10*3/uL (ref 4.0–10.5)

## 2015-09-16 LAB — LACTIC ACID, PLASMA
LACTIC ACID, VENOUS: 0.8 mmol/L (ref 0.5–1.9)
Lactic Acid, Venous: 4.2 mmol/L (ref 0.5–1.9)

## 2015-09-16 LAB — PREALBUMIN: Prealbumin: 14.3 mg/dL — ABNORMAL LOW (ref 18–38)

## 2015-09-16 LAB — PROTIME-INR
INR: 5.23 (ref 0.00–1.49)
Prothrombin Time: 45.1 seconds — ABNORMAL HIGH (ref 11.6–15.2)

## 2015-09-16 LAB — AMMONIA: AMMONIA: 81 umol/L — AB (ref 9–35)

## 2015-09-16 MED ORDER — VITAMIN K1 10 MG/ML IJ SOLN
10.0000 mg | Freq: Every day | INTRAMUSCULAR | Status: DC
  Filled 2015-09-16: qty 1

## 2015-09-16 MED ORDER — SODIUM CHLORIDE 0.9% FLUSH: 10.0000 mL | Freq: Two times a day (BID) | INTRAVENOUS | Status: DC

## 2015-09-16 MED ORDER — VITAMIN K1 10 MG/ML IJ SOLN
5.0000 mg | Freq: Once | INTRAVENOUS | Status: AC
  Administered 2015-09-16: 5 mg via INTRAVENOUS
  Filled 2015-09-16: qty 0.5

## 2015-09-16 MED ORDER — DEXTROSE 5 % IV SOLN
10.0000 mg | Freq: Every day | INTRAVENOUS | Status: DC
  Administered 2015-09-16 – 2015-09-17 (×2): 10 mg via INTRAVENOUS
  Filled 2015-09-16 (×3): qty 1

## 2015-09-16 MED ORDER — SODIUM CHLORIDE 0.9% FLUSH
10.0000 mL | INTRAVENOUS | Status: DC | PRN
  Administered 2015-09-16 – 2015-09-18 (×6): 10 mL
  Filled 2015-09-16 (×5): qty 40

## 2015-09-16 MED ORDER — RIFAXIMIN 200 MG PO TABS
200.0000 mg | ORAL_TABLET | Freq: Three times a day (TID) | ORAL | Status: DC
  Administered 2015-09-16 – 2015-09-20 (×14): 200 mg via ORAL
  Filled 2015-09-16 (×15): qty 1

## 2015-09-16 MED ORDER — SODIUM CHLORIDE 0.9 % IV SOLN
INTRAVENOUS | Status: DC
  Administered 2015-09-16 (×2): via INTRAVENOUS
  Filled 2015-09-16 (×5): qty 1000

## 2015-09-16 NOTE — Progress Notes (Signed)
[facility] PROGRESS NOTE    Progress Note  Jonathan Reed  [id number] DOB: 1945/06/02 DOA: 08/27/2015 PCP: Lorella Nimrod, MD     Brief Narrative:   Jonathan Reed is an 70 y.o. male past medical history significant for metastatic and neuroendocrine tumor within normal limits is being admitted for lactic acidosis  Assessment/Plan:   Lactic acidosis likely due to Liver failure, acute Liver failure likely due to metastatic disease. Hepatic function panel is pending, he is scheduled for paracentesis but his INR continues to trend high probably due to his liver disease. It could be that his liver failure might be to spontaneous bacterial peritonitis which seems to be unlikely, he has no abdominal pain, no fever no leukocytosis. Will continue IV Rocephin and await for paracentesis to be done.  Ischemic cardiomyopathy with history of V-tach: Will have to be a dishes with IV fluid hydration and his CSF is 40%.  Ascites: No pain, no fever no leukocytosis. With mild confusion He was started empirically on IV Rocephin, we'll give IV vitamin K as will need to see an improvement in INR in order to perform paracentesis.  Metastatic malignant neuroendocrine tumor to liver [facility]) Appreciate oncology's assistance.  Essential hypertension  CAD (coronary artery disease)  Chronic a-fib (HCC) Rate controlled INR therapeutic.   DVT prophylaxis: none Family Communication:daughters Disposition Plan/Barrier to D/C: home in 3 day Code Status:     Code Status Orders        Start     Ordered   08/25/2015 1831  Do not attempt resuscitation (DNR)   Continuous    Question Answer Comment  In the event of cardiac or respiratory ARREST Do not call a "code blue"   In the event of cardiac or respiratory ARREST Do not perform Intubation, CPR, defibrillation or ACLS   In the event of cardiac or respiratory ARREST Use medication by any route, position, wound care, and other measures to relive pain  and suffering. May use oxygen, suction and manual treatment of airway obstruction as needed for comfort.      08/24/2015 1832    Code Status History    Date Active Date Inactive Code Status Order ID Comments User Context   09/02/2015  1:46 PM 08/28/2015  6:32 PM Full Code [id number]  Mir Marry Guan, MD ED   04/12/2012  4:58 PM 04/16/2012 10:02 PM Full Code [id number]  Junius Roads, RN Inpatient    Advance Directive Documentation        Most Recent Value   Type of Advance Directive  Healthcare Power of Attorney   Pre-existing out of facility DNR order (yellow form or pink MOST form)     "MOST" Form in Place?          IV Access:    Peripheral IV   Procedures and diagnostic studies:   Dg Chest Port 1 View  September 19, 2015  CLINICAL DATA:  Status post central line placement EXAM: PORTABLE CHEST 1 VIEW COMPARISON:  08/17/2015 FINDINGS: Pacing device is again identified and stable. Cardiac shadow is stable. Right basilar infiltrate is again seen and stable. A new right PICC line is noted at cavoatrial junction in satisfactory position. IMPRESSION: Status post right PICC line placement in satisfactory position. The remainder of the exam is stable from the previous day. Electronically Signed   By: Inez Catalina M.D.   On: 2015/09/19 09:36   Dg Chest Portable 1 View  08/19/2015  CLINICAL DATA:  Increasing shortness of breath over  the last 3 weeks, history of known liver carcinoma EXAM: PORTABLE CHEST 1 VIEW COMPARISON:  06/01/2014 FINDINGS: Cardiac shadow is stable. A defibrillator is again noted. Elevation of the right hemidiaphragm is noted with right basilar atelectatic changes. Small effusion cannot be totally excluded. No bony abnormality is noted. IMPRESSION: Right basilar atelectasis/infiltrate with likely small effusion. Electronically Signed   By: Inez Catalina M.D.   On: 09/13/2015 12:09     Medical Consultants:    None.  Anti-Infectives:   Rocephin  Subjective:    Jonathan  T Reed no complains, he relates a little bit of anorexia.  Objective:    Filed Vitals:   09/04/2015 1600 09/14/2015 1700 09/12/2015 2116 09/16/15 0537  BP: 92/67 105/53 95/64 98/68   Pulse: 32 77 32 65  Temp:   97.9 F (36.6 C) 98.2 F (36.8 C)  TempSrc:   Oral Oral  Resp: 22 23 22 20   Height:  5\' 11"  (1.803 m)    Weight:  71.215 kg (157 lb)    SpO2: 97% 98% 96% 96%    Intake/Output Summary (Last 24 hours) at 09/16/15 1042 Last data filed at 09/16/15 0630  Gross per 24 hour  Intake   1865 ml  Output      0 ml  Net   1865 ml   Filed Weights   09/09/2015 1031 08/30/2015 1700  Weight: 71.215 kg (157 lb) 71.215 kg (157 lb)    Exam: General exam: In no acute distress. Respiratory system: Good air movement and clear to auscultation. Cardiovascular system: S1 & S2 heard, RRR. Gastrointestinal system: Abdomen is nondistended, soft and nontender.  Central nervous system: Alert and oriented. No focal neurological deficits. Extremities: No pedal edema. Skin: No rashes, lesions or ulcers Psychiatry: Judgement and insight appear normal. Mood & affect appropriate.    Data Reviewed:    Labs: Basic Metabolic Panel:  Recent Labs Lab 08/26/2015 1123 09/16/15 0522  NA 136 137  K 4.1 4.4  CL 102 103  CO2 22 25  GLUCOSE 110* 111*  BUN 34* 37*  CREATININE 1.83* 1.91*  CALCIUM 8.9 8.6*   GFR Estimated Creatinine Clearance: 37.8 mL/min (by C-G formula based on Cr of 1.91). Liver Function Tests: No results for input(s): AST, ALT, ALKPHOS, BILITOT, PROT, ALBUMIN in the last 168 hours. No results for input(s): LIPASE, AMYLASE in the last 168 hours.  Recent Labs Lab 09/16/15 0527  AMMONIA 81*   Coagulation profile  Recent Labs Lab 09/11/15 0953 09/09/2015 0953 09/09/2015 1209 09/16/15 0522  INR 5.20 4.70 4.55* 5.23*    CBC:  Recent Labs Lab 09/01/2015 1123 09/16/15 0522  WBC 5.4 6.1  HGB 13.4 12.4*  HCT 35.1* 32.3*  MCV 85.0 85.9  PLT 109* 124*   Cardiac Enzymes: No  results for input(s): CKTOTAL, CKMB, CKMBINDEX, TROPONINI in the last 168 hours. BNP (last 3 results) No results for input(s): PROBNP in the last 8760 hours. CBG:  Recent Labs Lab 08/28/2015 1057  GLUCAP 98   D-Dimer: No results for input(s): DDIMER in the last 72 hours. Hgb A1c: No results for input(s): HGBA1C in the last 72 hours. Lipid Profile: No results for input(s): CHOL, HDL, LDLCALC, TRIG, CHOLHDL, LDLDIRECT in the last 72 hours. Thyroid function studies: No results for input(s): TSH, T4TOTAL, T3FREE, THYROIDAB in the last 72 hours.  Invalid input(s): FREET3 Anemia work up: No results for input(s): VITAMINB12, FOLATE, FERRITIN, TIBC, IRON, RETICCTPCT in the last 72 hours. Sepsis Labs:  Recent Labs Lab 08/26/2015 1123  09/12/2015 1152 09/16/15 0522 09/16/15 0749 09/16/15 0900  WBC 5.4  --  6.1  --   --   LATICACIDVEN  --  4.35*  --  0.8 4.2*   Microbiology No results found for this or any previous visit (from the past 240 hour(s)).   Medications:   . sodium chloride   Intravenous STAT  . cefTRIAXone (ROCEPHIN)  IV  2 g Intravenous Q24H  . feeding supplement (ENSURE ENLIVE)  237 mL Oral BID BM  . sodium chloride flush  10-40 mL Intracatheter Q12H   Continuous Infusions: . 0.9 % NaCl with KCl 20 mEq / L 100 mL/hr at 09/16/15 0129    Time spent: 25 min     FELIZ Marguarite Arbour  [facility] Pager [phone]  *Please refer to [email or web address], password [email or web address] to get updated schedule on who will round on this patient, as hospitalists switch teams weekly. If 7PM-7AM, please contact night-coverage at [email or web address], password [email or web address] for any overnight needs.  09/16/2015, 10:42 AM

## 2015-09-16 NOTE — Progress Notes (Signed)
Request seen for paracentesis  Please attempt to correct coagulopathy.   Can safely proceed with paracentesis when INR at least < 3  Vallery Mcdade S Oksana Deberry PA-C 09/16/2015 8:54 AM

## 2015-09-16 NOTE — Progress Notes (Signed)
Jonathan Reed looks a little bit better today. He is not to be as dehydrated He may be eating a little better. I think that his diet can be liberalized. Given the "big picture", I don't see a problem with him have a regular diet.  His liver studies still not back yet.  His prealbumin is actually better than I thought.. Is 14.3. His ammonia is 81.we will try some Xifaxin or him. I much or how well he would do with lactulose.  He is not complaining of any pain. He's had no nausea or vomiting. there is no bleeding. He is off Coumadin. His INR is still quite high. The INR  today was 5.32. I would increase his vitamin K.  I will put some multivitamins in his IV fluid.  Hhe does appear to have more edema in his legs. I think that compression stockings will help. I think physical therapy may also help him. He might be able to do this.  So far, his appetite is still not a great. It might be a little bit better. His mouth is not as dry.  His blood pressures on the low side.this still might reflect some hypovolemia.   He has had no bleeding or bruising.he has had no diarrhea. There may be some constipation.  The PICC line is in. I ery much appreciate the IV team doing this so quickly. I think we can get the IV out of he is  Left arm.  Overall, the outlook still is not that great. I really need to see what his  Liver function studies are.  I told his family to ring whenever they want for him to eat. Again, calories are very important for him right now.  He is therefore a paracentesis by count have this because of the  Elevated INR. I will increase the vitamin K  To 10 mg a day. I did not sure if  The INR can improve significantly. Again, this might reflect hepatic dysfunction.  i appreciate all the great care that hhe is getting by everybody up on 4 E.   [place] 34:8

## 2015-09-16 NOTE — Progress Notes (Signed)
Peripherally Inserted Central Catheter/Midline Placement  The IV Nurse has discussed with the patient and/or persons authorized to consent for the patient, the purpose of this procedure and the potential benefits and risks involved with this procedure.  The benefits include less needle sticks, lab draws from the catheter and patient may be discharged home with the catheter.  Risks include, but not limited to, infection, bleeding, blood clot (thrombus formation), and puncture of an artery; nerve damage and irregular heat beat.  Alternatives to this procedure were also discussed.  PICC/Midline Placement Documentation  PICC Double Lumen 09/16/15 PICC Right Brachial 39 cm 0 cm (Active)  Indication for Insertion or Continuance of Line Poor Vasculature-patient has had multiple peripheral attempts or PIVs lasting less than 24 hours 09/16/2015  8:59 AM  Exposed Catheter (cm) 0 cm 09/16/2015  8:59 AM  Site Assessment Clean;Dry;Intact 09/16/2015  8:59 AM  Lumen #1 Status Flushed;Saline locked;Blood return noted 09/16/2015  8:59 AM  Lumen #2 Status Flushed;Saline locked;Blood return noted 09/16/2015  8:59 AM  Dressing Type Transparent 09/16/2015  8:59 AM  Dressing Status Clean;Dry;Intact 09/16/2015  8:59 AM  Dressing Change Due 09/23/15 09/16/2015  8:59 AM       Gordan Payment 09/16/2015, 9:00 AM

## 2015-09-16 NOTE — Progress Notes (Signed)
Called INR results and yesterdays lactic acid level to md

## 2015-09-16 DEATH — deceased

## 2015-09-17 ENCOUNTER — Inpatient Hospital Stay (HOSPITAL_COMMUNITY): Payer: Medicare Other

## 2015-09-17 LAB — CBC WITH DIFFERENTIAL/PLATELET
BASOS ABS: 0 10*3/uL (ref 0.0–0.1)
Basophils Relative: 0 %
EOS PCT: 1 %
Eosinophils Absolute: 0.1 10*3/uL (ref 0.0–0.7)
HEMATOCRIT: 32.5 % — AB (ref 39.0–52.0)
Hemoglobin: 12.3 g/dL — ABNORMAL LOW (ref 13.0–17.0)
LYMPHS ABS: 1 10*3/uL (ref 0.7–4.0)
Lymphocytes Relative: 13 %
MCH: 32.7 pg (ref 26.0–34.0)
MCHC: 37.8 g/dL — AB (ref 30.0–36.0)
MCV: 86.4 fL (ref 78.0–100.0)
MONO ABS: 0.8 10*3/uL (ref 0.1–1.0)
Monocytes Relative: 10 %
Neutro Abs: 5.7 10*3/uL (ref 1.7–7.7)
Neutrophils Relative %: 76 %
Platelets: 94 10*3/uL — ABNORMAL LOW (ref 150–400)
RBC: 3.76 MIL/uL — AB (ref 4.22–5.81)
RDW: 23.3 % — AB (ref 11.5–15.5)
WBC: 7.6 10*3/uL (ref 4.0–10.5)

## 2015-09-17 LAB — COMPREHENSIVE METABOLIC PANEL
ALBUMIN: 1.7 g/dL — AB (ref 3.5–5.0)
ALT: 40 U/L (ref 17–63)
ANION GAP: 10 (ref 5–15)
AST: 73 U/L — ABNORMAL HIGH (ref 15–41)
Alkaline Phosphatase: 243 U/L — ABNORMAL HIGH (ref 38–126)
BILIRUBIN TOTAL: 17.2 mg/dL — AB (ref 0.3–1.2)
BUN: 36 mg/dL — ABNORMAL HIGH (ref 6–20)
CO2: 23 mmol/L (ref 22–32)
Calcium: 8.7 mg/dL — ABNORMAL LOW (ref 8.9–10.3)
Chloride: 103 mmol/L (ref 101–111)
Creatinine, Ser: 1.72 mg/dL — ABNORMAL HIGH (ref 0.61–1.24)
GFR calc Af Amer: 46 mL/min — ABNORMAL LOW (ref >60)
GFR, EST NON AFRICAN AMERICAN: 39 mL/min — AB (ref >60)
GLUCOSE: 106 mg/dL — AB (ref 65–99)
POTASSIUM: 4.2 mmol/L (ref 3.5–5.1)
Sodium: 136 mmol/L (ref 135–145)
TOTAL PROTEIN: 4.6 g/dL — AB (ref 6.5–8.1)

## 2015-09-17 LAB — PROTIME-INR
INR: 2.26 — AB (ref 0.00–1.49)
PROTHROMBIN TIME: 24 s — AB (ref 11.6–15.2)

## 2015-09-17 LAB — URINE CULTURE: Culture: 20000 — AB

## 2015-09-17 MED ORDER — ALBUMIN HUMAN 25 % IV SOLN
25.0000 g | Freq: Four times a day (QID) | INTRAVENOUS | Status: AC
  Administered 2015-09-17: 25 g via INTRAVENOUS
  Filled 2015-09-17: qty 100

## 2015-09-17 NOTE — Progress Notes (Signed)
[facility] PROGRESS NOTE    Progress Note  Jonathan STAUDE  [id number] DOB: Mar 12, 1946 DOA: 09/11/2015 PCP: Lorella Nimrod, MD     Brief Narrative:   Jonathan Reed is an 70 y.o. male past medical history significant neuroendocrine tumor with multiple metastatic liver disease, being admitted for lactic acidosis  Assessment/Plan:   Lactic acidosis likely due to Liver failure, acute: Liver failure likely due to metastatic disease. Hepatic function panel is Improving except for his bilirubin which is trending up, he is scheduled for paracentesis hopefully today. INR is less than 2.5. It could be that his liver failure might be to spontaneous bacterial peritonitis. Will continue IV Rocephin for paracentesis to be today.  Ischemic cardiomyopathy with history of V-tach: Will have to be a dishes with IV fluid hydration and his CSF is 40%.  Ascites: No pain, no fever no leukocytosis. Confusion resolved today. Continue IV Rocephin, for paracentesis today.  Metastatic malignant neuroendocrine tumor to liver [facility]) Appreciate oncology's assistance.  Essential hypertension  CAD (coronary artery disease)  Chronic a-fib (HCC) Rate controlled INR  therapeutic.  Thrombocytopenia:  DVT prophylaxis: none Family Communication:daughters Disposition Plan/Barrier to D/C: home in 3-4 day Code Status:     Code Status Orders        Start     Ordered   09/07/2015 1831  Do not attempt resuscitation (DNR)   Continuous    Question Answer Comment  In the event of cardiac or respiratory ARREST Do not call a "code blue"   In the event of cardiac or respiratory ARREST Do not perform Intubation, CPR, defibrillation or ACLS   In the event of cardiac or respiratory ARREST Use medication by any route, position, wound care, and other measures to relive pain and suffering. May use oxygen, suction and manual treatment of airway obstruction as needed for comfort.      09/07/2015 1832    Code Status  History    Date Active Date Inactive Code Status Order ID Comments User Context   08/20/2015  1:46 PM 09/12/2015  6:32 PM Full Code [id number]  Mir Marry Guan, MD ED   04/12/2012  4:58 PM 04/16/2012 10:02 PM Full Code [id number]  Junius Roads, RN Inpatient    Advance Directive Documentation        Most Recent Value   Type of Advance Directive  Healthcare Power of Attorney   Pre-existing out of facility DNR order (yellow form or pink MOST form)     "MOST" Form in Place?          IV Access:    Peripheral IV   Procedures and diagnostic studies:   Dg Chest Port 1 View  Sep 20, 2015  CLINICAL DATA:  Status post central line placement EXAM: PORTABLE CHEST 1 VIEW COMPARISON:  08/28/2015 FINDINGS: Pacing device is again identified and stable. Cardiac shadow is stable. Right basilar infiltrate is again seen and stable. A new right PICC line is noted at cavoatrial junction in satisfactory position. IMPRESSION: Status post right PICC line placement in satisfactory position. The remainder of the exam is stable from the previous day. Electronically Signed   By: Inez Catalina M.D.   On: September 20, 2015 09:36   Dg Chest Portable 1 View  08/21/2015  CLINICAL DATA:  Increasing shortness of breath over the last 3 weeks, history of known liver carcinoma EXAM: PORTABLE CHEST 1 VIEW COMPARISON:  06/01/2014 FINDINGS: Cardiac shadow is stable. A defibrillator is again noted. Elevation of the right hemidiaphragm is noted  with right basilar atelectatic changes. Small effusion cannot be totally excluded. No bony abnormality is noted. IMPRESSION: Right basilar atelectasis/infiltrate with likely small effusion. Electronically Signed   By: Inez Catalina M.D.   On: 09/10/2015 12:09     Medical Consultants:    None.  Anti-Infectives:   Rocephin  Subjective:    Kristina T Clemons no complains, He relates his appetite is improved.  Objective:    Filed Vitals:   09/16/15 1319 09/16/15 1429 09/16/15 2131  09/17/15 0605  BP: 89/64 95/61 104/71 101/65  Pulse: 72  61 63  Temp: 98.2 F (36.8 C)  98.2 F (36.8 C) 97.6 F (36.4 C)  TempSrc: Oral  Oral Oral  Resp: 18  18 18   Height:      Weight:      SpO2: 97%  97% 98%    Intake/Output Summary (Last 24 hours) at 09/17/15 0831 Last data filed at 09/17/15 0536  Gross per 24 hour  Intake   1720 ml  Output    700 ml  Net   1020 ml   Filed Weights   08/29/2015 1031 08/23/2015 1700  Weight: 71.215 kg (157 lb) 71.215 kg (157 lb)    Exam: General exam: In no acute distress. Respiratory system: Good air movement and clear to auscultation. Cardiovascular system: S1 & S2 heard, RRR. Gastrointestinal system: Abdomen is nondistended, soft and nontender.  Central nervous system: Alert and oriented. No focal neurological deficits. Extremities: No pedal edema. Skin: No rashes, lesions or ulcers Psychiatry: Judgement and insight appear normal. Mood & affect appropriate.    Data Reviewed:    Labs: Basic Metabolic Panel:  Recent Labs Lab 09/13/2015 1123 09/16/15 0522 09/17/15 0352  NA 136 137 136  K 4.1 4.4 4.2  CL 102 103 103  CO2 22 25 23   GLUCOSE 110* 111* 106*  BUN 34* 37* 36*  CREATININE 1.83* 1.91* 1.72*  CALCIUM 8.9 8.6* 8.7*   GFR Estimated Creatinine Clearance: 42 mL/min (by C-G formula based on Cr of 1.72). Liver Function Tests:  Recent Labs Lab 09/16/15 0522 09/17/15 0352  AST 64* 73*  ALT 42 40  ALKPHOS 237* 243*  BILITOT 14.9* 17.2*  PROT 4.5* 4.6*  ALBUMIN 1.6* 1.7*   No results for input(s): LIPASE, AMYLASE in the last 168 hours.  Recent Labs Lab 09/16/15 0527  AMMONIA 81*   Coagulation profile  Recent Labs Lab 09/11/15 0953 09/06/2015 0953 08/29/2015 1209 09/16/15 0522 09/17/15 0703  INR 5.20 4.70 4.55* 5.23* 2.26*    CBC:  Recent Labs Lab 09/05/2015 1123 09/16/15 0522 09/17/15 0352  WBC 5.4 6.1 7.6  NEUTROABS  --   --  5.7  HGB 13.4 12.4* 12.3*  HCT 35.1* 32.3* 32.5*  MCV 85.0 85.9 86.4    PLT 109* 124* 94*   Cardiac Enzymes: No results for input(s): CKTOTAL, CKMB, CKMBINDEX, TROPONINI in the last 168 hours. BNP (last 3 results) No results for input(s): PROBNP in the last 8760 hours. CBG:  Recent Labs Lab 09/01/2015 1057  GLUCAP 98   D-Dimer: No results for input(s): DDIMER in the last 72 hours. Hgb A1c: No results for input(s): HGBA1C in the last 72 hours. Lipid Profile: No results for input(s): CHOL, HDL, LDLCALC, TRIG, CHOLHDL, LDLDIRECT in the last 72 hours. Thyroid function studies: No results for input(s): TSH, T4TOTAL, T3FREE, THYROIDAB in the last 72 hours.  Invalid input(s): FREET3 Anemia work up: No results for input(s): VITAMINB12, FOLATE, FERRITIN, TIBC, IRON, RETICCTPCT in the last 34  hours. Sepsis Labs:  Recent Labs Lab 09/07/2015 1123 09/02/2015 1152 09/16/15 0522 09/16/15 0749 09/16/15 0900 09/17/15 0352  WBC 5.4  --  6.1  --   --  7.6  LATICACIDVEN  --  4.35*  --  0.8 4.2*  --    Microbiology Recent Results (from the past 240 hour(s))  Urine culture     Status: Abnormal (Preliminary result)   Collection Time: 09/04/2015  1:32 PM  Result Value Ref Range Status   Specimen Description URINE, RANDOM  Final   Special Requests Immunocompromised  Final   Culture 20,000 COLONIES/mL GRAM NEGATIVE RODS (A)  Final   Report Status PENDING  Incomplete     Medications:   . cefTRIAXone (ROCEPHIN)  IV  2 g Intravenous Q24H  . feeding supplement (ENSURE ENLIVE)  237 mL Oral BID BM  . phytonadione (VITAMIN K) IV  10 mg Intravenous Daily  . rifaximin  200 mg Oral TID  . sodium chloride flush  10-40 mL Intracatheter Q12H   Continuous Infusions: . sodium chloride 0.9 % 1,000 mL with multivitamins adult (MVI -12) 10 mL infusion 50 mL/hr at 09/16/15 1900    Time spent: 25 min   LOS: 1 day   Charlynne Cousins  [facility] Pager [phone]  *Please refer to [email or web address], password [email or web address] to get updated schedule on who will round on this  patient, as hospitalists switch teams weekly. If 7PM-7AM, please contact night-coverage at [email or web address], password [email or web address] for any overnight needs.  09/17/2015, 8:31 AM

## 2015-09-17 NOTE — Progress Notes (Signed)
PT Cancellation Note  Patient Details Name: Jonathan Reed MRN: [id number] DOB: Feb 17, 1946   Cancelled Treatment:    Reason Eval/Treat Not Completed: Patient at procedure or test/unavailable (pt just leaving for paracentesis on PT arrival) will check back as schedule permits   [facility] 09/17/2015, 12:36 PM

## 2015-09-17 NOTE — Progress Notes (Signed)
Initial Nutrition Assessment  DOCUMENTATION CODES:   Severe malnutrition in context of chronic illness  INTERVENTION:  -Magic cup TID with meals, each supplement provides 290 kcal and 9 grams of protein Ensure Enlive po BID, each supplement provides 350 kcal and 20 grams of protein -RD to continue to monitor NUTRITION DIAGNOSIS:   Malnutrition related to chronic illness as evidenced by severe depletion of muscle mass, severe depletion of body fat, percent weight loss.  GOAL:   Patient will meet greater than or equal to 90% of their needs  MONITOR:   PO intake, I & O's, Skin, Labs  REASON FOR ASSESSMENT:   Malnutrition Screening Tool    ASSESSMENT:   Jonathan Reed is a 70 y.o. male with medical history significant for metastatic neuroendocrine tumor with innumerable liver mets who is being admitted due to lactic acidosis, dehydration and weakness. History is provided by the patient, his two daughters and his wife. They tell me he has been weak chronically for the last month or so, worsening daily  Spoke with Mr. Sack, daughter at bedside. Endorses poor appetite for 1 month or so along with chronic weakness. He does drink ensure that his daughter gets for him, but is only consuming 2 small meals a day. Continues to have liver failure and ascites. Wt has dropped 27#/15% in the last 2.5 months, which is significant for that time frame. He also exhibits lower extremity edema. Denies issues chewing/swallowing. Denies nausea/vomiting  Nutrition-Focused physical exam completed. Findings are severe fat depletion, severe muscle depletion, and no edema.   Labs and medications reviewed: Mg 2.6 Vitamin K, Multivitamin in IV Diet Order:  Diet regular Room service appropriate?: Yes; Fluid consistency:: Thin  Skin:  Reviewed, no issues  Last BM:  6/30  Height:   Ht Readings from Last 1 Encounters:  09/14/2015 5\' 11"  (1.803 m)    Weight:   Wt Readings from Last 1 Encounters:   09/09/2015 157 lb (71.215 kg)    Ideal Body Weight:  78.18 kg  BMI:  Body mass index is 21.91 kg/(m^2).  Estimated Nutritional Needs:   Kcal:  1700-2100 calories  Protein:  85-105 grams  Fluid:  >/= 1.7L  EDUCATION NEEDS:   No education needs identified at this time  Satira Anis. Violet Cart, MS, RD LDN Inpatient Clinical Dietitian Pager [phone]

## 2015-09-17 NOTE — Progress Notes (Signed)
  No safe pocket to allow for safe approach for paracentesis today.  Recommend having patient lay on his right side after breakfast tomorrow morning to allow fluid to collect on one side and re-order paracentesis.   WENDY S BLAIR PA-C 09/17/2015 11:33 AM

## 2015-09-18 DIAGNOSIS — B9620 Unspecified Escherichia coli [E. coli] as the cause of diseases classified elsewhere: Secondary | ICD-10-CM

## 2015-09-18 DIAGNOSIS — N390 Urinary tract infection, site not specified: Secondary | ICD-10-CM

## 2015-09-18 LAB — PROTIME-INR
INR: 2.05 — ABNORMAL HIGH (ref 0.00–1.49)
Prothrombin Time: 22.3 seconds — ABNORMAL HIGH (ref 11.6–15.2)

## 2015-09-18 LAB — AMMONIA: Ammonia: 51 umol/L — ABNORMAL HIGH (ref 9–35)

## 2015-09-18 LAB — PREALBUMIN: Prealbumin: 14.6 mg/dL — ABNORMAL LOW (ref 18–38)

## 2015-09-18 NOTE — Progress Notes (Signed)
Notified by Dr. Marin Olp of family request for Hospice and [place] services at home after discharge. Chart and patient information currently under review to confirm hospice eligibility.   Spoke with patient and daughters at bedside to initiate education related to hospice philosophy, services and team approach to care. Family verbalized understanding of the information provided.  Daughters tearful and expressed this decline has happened quickly.  Offered emotional support.  Please send signed completed DNR form home with patient.  Patient will need prescriptions for discharge comfort medications.   DME needs discussed and family requested walker, cane and W/C for delivery to the home today.  The home address has been verified and is correct in the chart; Tammy is family member to be contacted to arrange time of delivery.   [facility] aware of the above.   Completed discharge summary will need to be faxed to [facility] at [phone] when final.  Please notify [facility] when patient is ready to leave unit at discharge-call [phone].   [facility] information and contact numbers have been given to patient during visit.   Above information shared with Cookie, CMRN.  Please call with any questions.   Thank You,  Freddi Starr RN, [facility] Liaison  [phone]

## 2015-09-18 NOTE — Progress Notes (Signed)
Jonathan Reed is really about the same. He may be a little bit more.  The liver function tests over the week and close show the problem. His bilirubin is now over 17. Just 2 months ago, his bilirubin was 4.2.  I really think his liver is failing him. I still have a hard time believing this is all from his malignancy. I realize that his cancer is progressing. I just wonder if he does not have any underlying cirrhosis itself. Regardless, I don't think there is anything that we can do about this.  I talked to he and his wife this morning about getting hospice involved. I think this probably would be a very good idea for them. We have no other options for his underlying malignancy. He is not a candidate for any aggressive chemotherapy.  His appetite might be low but better.  I'm unsure how much she will be okay to do with physical therapy but I know that he will try.  I don't think that a paracentesis needs be done on him. There is very little ascites. I think the possibility of him having bacterial peritonitis is very low. His belly is not tender. He has had no fever.  He does have Escherichia coli in his urine. This certainly could be the cause of all of his symptoms right now. I suppose that this may cause some transient hepatic insufficiency.  I would do want to check his ammonia level. I think it will be important for [place] to see what this is.  He appears to be fairly comfortable. He is not hurting. I think he is going to the bathroom.  He is not wearing compression stockings. He just feels that these are uncomfortable.  On his physical exam, his blood pressure is 102/62. Temperature is 90.8 degrees. Pulse is 71. His head and neck exam shows the sclera icterus. He has no adenopathy in his neck. Lungs are with some decrease in the bases. Cardiac exam regular rate and rhythm. S1 over 6 systolic ejection murmur. Abdomen is distended. Abdomen is somewhat soft. There is no obvious fluid wave. He has  massive hepatomegaly. Extremities shows some 1+ edema in his legs. He has most likely in other extremities. Neurological exam shows no focal neurological deficits. He is a little bit lethargic.  Jonathan Reed has basically end-stage hepatic failure. He has progressive neuroendocrine carcinoma.  From my point of view, are goal has to be comfort care and quality of life. Again I talked to he and his wife about hospice. I'll have to talk to their daughters about this also. I just think that hospice will be a great idea for them.  Treating the Escherichia coli in his urine will help to some degree.  We'll continue to follow along. It is obvious that he is getting fantastic care from everybody up on [address]  P[place] [address].  Psalm 30:1-2

## 2015-09-18 NOTE — Progress Notes (Signed)
[facility] PROGRESS NOTE    Progress Note  JORDELL GIBBINS  [id number] DOB: Mar 31, 1945 DOA: 09/14/2015 PCP: Lorella Nimrod, MD     Brief Narrative:   Jonathan Reed is an 70 y.o. male past medical history significant neuroendocrine tumor with multiple metastatic liver disease, being admitted for lactic acidosis  Assessment/Plan:   Lactic acidosis likely due to Liver failure, acute:  Liver failure likely due to metastatic disease. IR attempted ultrasound-guided paracentesis but not enough fluid, currently empiric on Rocephin, clinically chances of his BP low, he is pain-free and afebrile, his liver function is progressively declining, per oncology likely due to progressive neuroendocrine tumor. Prognosis poor. Currently DO NOT RESUSCITATE will involve palliative care as well. No need for paracentesis per oncologist at this time.  Ischemic cardiomyopathy with history of V-tach:  Will have to be a dishes with IV fluid hydration and his CSF is 40%.  Ascites: No pain, no fever no leukocytosis. Confusion resolved today. Continue Rocephin and for now Xifaxan.  Metastatic malignant neuroendocrine tumor to liver [facility]) -  Appreciate oncology's assistance.  Essential hypertension - Blood pressure currently soft off medications.  History of CAD and chronic atrial fibrillation. [place] vasc 2 score of at least 3. INR is therapeutic due to liver failure,  Thrombocytopenia: Due to progressive liver failure. Supportive care only.  DVT prophylaxis: INR therapeutic due to liver failure Family Communication: None present Disposition Plan/Barrier to D/C: home in 1-2 days Code Status: DNR Consults. Oncology, palliative care  IV Access:    Peripheral IV   Procedures and diagnostic studies:   US Abdomen Limited  09/17/2015  CLINICAL DATA:  History of metastatic neuroendocrine tumor to the liver. Limited ultrasound done prior to possible paracentesis. EXAM: LIMITED ABDOMEN ULTRASOUND FOR  ASCITES TECHNIQUE: Limited ultrasound survey for ascites was performed in all four abdominal quadrants. COMPARISON:  None. FINDINGS: Limited ultrasound survey for ascites was performed in all four abdominal quadrants. There is a minimal amount of peritoneal fluid. There is no significant pocket of fluid to allow for a safe approach for paracentesis IMPRESSION: Minimal ascites. Read by:  Gareth Eagle, PA-C Electronically Signed   By: Lucrezia Europe M.D.   On: 09/17/2015 11:40     Medical Consultants:    None.  Anti-Infectives:   Anti-infectives    Start     Dose/Rate Route Frequency Ordered Stop   09/16/15 1600  rifaximin (XIFAXAN) tablet 200 mg     200 mg Oral 3 times daily 09/16/15 1238     09/04/2015 1500  cefTRIAXone (ROCEPHIN) 2 g in dextrose 5 % 50 mL IVPB     2 g 100 mL/hr over 30 Minutes Intravenous Every 24 hours 08/31/2015 1440         Subjective:    [name] in bed, denies any fever or chills, denies any chest or abdominal pain, he is currently not short of breath  Objective:    Filed Vitals:   09/17/15 1155 09/17/15 2058 09/17/15 2300 09/18/15 0519  BP: 100/69 91/56 97/56  102/62  Pulse: 68 66  71  Temp: 98.3 F (36.8 C) 98.2 F (36.8 C)  98 F (36.7 C)  TempSrc: Oral Oral  Oral  Resp: 18 18  18   Height:      Weight:    78.518 kg (173 lb 1.6 oz)  SpO2: 97% 98%  98%    Intake/Output Summary (Last 24 hours) at 09/18/15 1104 Last data filed at 09/18/15 0802  Gross per 24 hour  Intake   2300 ml  Output   1550 ml  Net    750 ml   Filed Weights   08/21/2015 1031 08/17/2015 1700 09/18/15 0519  Weight: 71.215 kg (157 lb) 71.215 kg (157 lb) 78.518 kg (173 lb 1.6 oz)    Exam: General exam: In no acute distress. Respiratory system: Good air movement and clear to auscultation. Cardiovascular system: S1 & S2 heard, RRR. Gastrointestinal system: Abdomen is mildly distended, soft and nontender.  Central nervous system: Alert and oriented. No focal neurological  deficits. Extremities: No pedal edema. Skin: No rashes, lesions or ulcers Psychiatry: Judgement and insight appear normal.     Data Reviewed:    Labs: Basic Metabolic Panel:  Recent Labs Lab 08/25/2015 1123 09/16/15 0522 09/17/15 0352  NA 136 137 136  K 4.1 4.4 4.2  CL 102 103 103  CO2 22 25 23   GLUCOSE 110* 111* 106*  BUN 34* 37* 36*  CREATININE 1.83* 1.91* 1.72*  CALCIUM 8.9 8.6* 8.7*   GFR Estimated Creatinine Clearance: 44.4 mL/min (by C-G formula based on Cr of 1.72). Liver Function Tests:  Recent Labs Lab 09/16/15 0522 09/17/15 0352  AST 64* 73*  ALT 42 40  ALKPHOS 237* 243*  BILITOT 14.9* 17.2*  PROT 4.5* 4.6*  ALBUMIN 1.6* 1.7*   No results for input(s): LIPASE, AMYLASE in the last 168 hours.  Recent Labs Lab 09/16/15 0527 09/18/15 0750  AMMONIA 81* 51*   Coagulation profile  Recent Labs Lab 09/12/2015 0953 08/23/2015 1209 09/16/15 0522 09/17/15 0703 09/18/15 0405  INR 4.70 4.55* 5.23* 2.26* 2.05*    CBC:  Recent Labs Lab 09/06/2015 1123 09/16/15 0522 09/17/15 0352  WBC 5.4 6.1 7.6  NEUTROABS  --   --  5.7  HGB 13.4 12.4* 12.3*  HCT 35.1* 32.3* 32.5*  MCV 85.0 85.9 86.4  PLT 109* 124* 94*   Cardiac Enzymes: No results for input(s): CKTOTAL, CKMB, CKMBINDEX, TROPONINI in the last 168 hours. BNP (last 3 results) No results for input(s): PROBNP in the last 8760 hours. CBG:  Recent Labs Lab 08/29/2015 1057  GLUCAP 98   D-Dimer: No results for input(s): DDIMER in the last 72 hours. Hgb A1c: No results for input(s): HGBA1C in the last 72 hours. Lipid Profile: No results for input(s): CHOL, HDL, LDLCALC, TRIG, CHOLHDL, LDLDIRECT in the last 72 hours. Thyroid function studies: No results for input(s): TSH, T4TOTAL, T3FREE, THYROIDAB in the last 72 hours.  Invalid input(s): FREET3 Anemia work up: No results for input(s): VITAMINB12, FOLATE, FERRITIN, TIBC, IRON, RETICCTPCT in the last 72 hours. Sepsis Labs:  Recent Labs Lab  09/11/2015 1123 09/13/2015 1152 09/16/15 0522 09/16/15 0749 09/16/15 0900 09/17/15 0352  WBC 5.4  --  6.1  --   --  7.6  LATICACIDVEN  --  4.35*  --  0.8 4.2*  --    Microbiology Recent Results (from the past 240 hour(s))  Urine culture     Status: Abnormal   Collection Time: 09/07/2015  1:32 PM  Result Value Ref Range Status   Specimen Description URINE, RANDOM  Final   Special Requests Immunocompromised  Final   Culture 20,000 COLONIES/mL ESCHERICHIA COLI (A)  Final   Report Status 09/17/2015 FINAL  Final   Organism ID, Bacteria ESCHERICHIA COLI (A)  Final      Susceptibility   Escherichia coli - MIC*    AMPICILLIN >=32 RESISTANT Resistant     CEFAZOLIN <=4 SENSITIVE Sensitive     CEFTRIAXONE <=1 SENSITIVE Sensitive  CIPROFLOXACIN <=0.25 SENSITIVE Sensitive     GENTAMICIN <=1 SENSITIVE Sensitive     IMIPENEM <=0.25 SENSITIVE Sensitive     NITROFURANTOIN <=16 SENSITIVE Sensitive     TRIMETH/SULFA <=20 SENSITIVE Sensitive     AMPICILLIN/SULBACTAM >=32 RESISTANT Resistant     PIP/TAZO <=4 SENSITIVE Sensitive     * 20,000 COLONIES/mL ESCHERICHIA COLI     Medications:   . cefTRIAXone (ROCEPHIN)  IV  2 g Intravenous Q24H  . feeding supplement (ENSURE ENLIVE)  237 mL Oral BID BM  . rifaximin  200 mg Oral TID   Continuous Infusions:    Time spent: 25 min   LOS: 2 days   Signature  Lala Lund K M.D on 09/18/2015 at 11:04 AM  Between 7am to 7pm - Pager - [phone], After 7pm go to [email or web address] - password [facility]  [facility]  - Office  [phone]  09/18/2015, 11:04 AM

## 2015-09-18 NOTE — Clinical Documentation Improvement (Signed)
Hospitalist  Please update your documentation within the medical record to reflect your response to this query. Thank you  Can the nutritional diagnosis of Malnutrition be further specified?   Document Severity - Severe(third degree), Moderate (second degree), Mild (first degree)  Other condition  Unable to clinically determine  Document any associated diagnoses/conditions  Supporting Information: :  09/17/15 Nutrition eval..Marland Kitchen"Severe malnutrition in context of chronic illness"..."Malnutrition related to chronic illness as evidenced by severe depletion of muscle mass, severe depletion of body fat, percent weight loss.".Marland KitchenMarland Kitchen See full nutrition eval for full assessment  Please exercise your independent, professional judgment when responding. A specific answer is not anticipated or expected.  Thank You, Ermelinda Das, RN, BSN, [place] Certified Clinical Documentation Specialist [place]: Health Information Management [phone]

## 2015-09-18 NOTE — Evaluation (Signed)
Physical Therapy Evaluation Patient Details Name: Jonathan Reed MRN: [id number] DOB: [birth year] Today's Date: 09/18/2015   History of Present Illness  70 y.o. male past medical history significant neuroendocrine tumor with multiple metastatic liver disease and admitted for Lactic acidosis likely due to Liver failure.  Clinical Impression  Pt admitted with above diagnosis. Pt currently with functional limitations due to the deficits listed below (see PT Problem List).  Pt will benefit from skilled PT to increase their independence and safety with mobility to allow discharge to the venue listed below.  Pt too weak to stand today however assisted to recliner with lateral scoot transfer.  Per chart review, poor prognosis and likely palliative consult.  Recommend HHPT if family able to assist at home however pt may need SNF if family unable to assist.     Follow Up Recommendations SNF;Home health PT;Supervision/Assistance - 24 hour (will depend on family assist available at home)    Equipment Recommendations  Wheelchair (measurements PT);Wheelchair cushion (measurements PT);Hospital bed    Recommendations for Other Services       Precautions / Restrictions Precautions Precautions: Fall Restrictions Weight Bearing Restrictions: No      Mobility  Bed Mobility Overal bed mobility: Needs Assistance Bed Mobility: Supine to Sit     Supine to sit: Mod assist     General bed mobility comments: slight assist for LEs to EOB and trunk upright  Transfers Overall transfer level: Needs assistance   Transfers: Sit to/from Stand;Lateral/Scoot Transfers Sit to Stand: Total assist        Lateral/Scoot Transfers: Min guard General transfer comment: attempted standing however pt too weak without extensive assist and legs buckling, performed lateral scoot transfer from bed to drop arm recliner for safety  Ambulation/Gait                Stairs            Wheelchair Mobility     Modified Rankin (Stroke Patients Only)       Balance                                             Pertinent Vitals/Pain Pain Assessment: No/denies pain    Home Living Family/patient expects to be discharged to:: Private residence Living Arrangements: Spouse/significant other Available Help at Discharge: Family;Available 24 hours/day Type of Home: House Home Access: Stairs to enter   [employer] of Steps: 2 Home Layout: One level Home Equipment: None      Prior Function Level of Independence: Independent         Comments: pt reports increased weakness and decreased mobility over the last week, at baseline he reports being at least household ambulator     Hand Dominance        Extremity/Trunk Assessment   Upper Extremity Assessment: Generalized weakness           Lower Extremity Assessment: Generalized weakness         Communication   Communication: No difficulties  Cognition Arousal/Alertness: Awake/alert Behavior During Therapy: WFL for tasks assessed/performed Overall Cognitive Status: Within Functional Limits for tasks assessed                      General Comments      Exercises        Assessment/Plan    PT Assessment Patient needs continued PT  services  PT Diagnosis Difficulty walking;Generalized weakness   PT Problem List Decreased strength;Decreased activity tolerance;Decreased mobility;Decreased knowledge of use of DME  PT Treatment Interventions DME instruction;Gait training;Functional mobility training;Wheelchair mobility training;Patient/family education;Therapeutic activities;Therapeutic exercise   PT Goals (Current goals can be found in the Care Plan section) Acute Rehab PT Goals PT Goal Formulation: With patient/family Time For Goal Achievement: 10/02/15 Potential to Achieve Goals: Fair    Frequency Min 3X/week   Barriers to discharge        Co-evaluation               End  of Session   Activity Tolerance: Patient limited by fatigue Patient left: in chair;with call bell/phone within reach;with chair alarm set Nurse Communication: Mobility status         Time: [id number] PT Time Calculation (min) (ACUTE ONLY): 15 min   Charges:   PT Evaluation $PT Eval Low Complexity: 1 Procedure     PT G Codes:        Nikkole Placzek,KATHrine E 09/18/2015, 12:53 PM Carmelia Bake, PT, DPT 09/18/2015 Pager: [phone]

## 2015-09-19 DIAGNOSIS — I959 Hypotension, unspecified: Secondary | ICD-10-CM

## 2015-09-19 DIAGNOSIS — K7290 Hepatic failure, unspecified without coma: Secondary | ICD-10-CM

## 2015-09-19 DIAGNOSIS — R609 Edema, unspecified: Secondary | ICD-10-CM

## 2015-09-19 LAB — COMPREHENSIVE METABOLIC PANEL
ALK PHOS: 233 U/L — AB (ref 38–126)
ALT: 40 U/L (ref 17–63)
ANION GAP: 11 (ref 5–15)
AST: 72 U/L — AB (ref 15–41)
Albumin: 1.8 g/dL — ABNORMAL LOW (ref 3.5–5.0)
BILIRUBIN TOTAL: 23.5 mg/dL — AB (ref 0.3–1.2)
BUN: 47 mg/dL — AB (ref 6–20)
CALCIUM: 8.8 mg/dL — AB (ref 8.9–10.3)
CO2: 22 mmol/L (ref 22–32)
Chloride: 103 mmol/L (ref 101–111)
Creatinine, Ser: 1.71 mg/dL — ABNORMAL HIGH (ref 0.61–1.24)
GFR calc Af Amer: 46 mL/min — ABNORMAL LOW (ref >60)
GFR, EST NON AFRICAN AMERICAN: 40 mL/min — AB (ref >60)
GLUCOSE: 104 mg/dL — AB (ref 65–99)
POTASSIUM: 4.1 mmol/L (ref 3.5–5.1)
Sodium: 136 mmol/L (ref 135–145)
TOTAL PROTEIN: 4.7 g/dL — AB (ref 6.5–8.1)

## 2015-09-19 LAB — PROTIME-INR
INR: 2.02 — ABNORMAL HIGH (ref 0.00–1.49)
PROTHROMBIN TIME: 22 s — AB (ref 11.6–15.2)

## 2015-09-19 MED ORDER — DEXTROSE 5 % IV SOLN
1.0000 g | Freq: Once | INTRAVENOUS | Status: AC
  Administered 2015-09-19: 1 g via INTRAVENOUS
  Filled 2015-09-19: qty 10

## 2015-09-19 MED ORDER — COLCHICINE 0.6 MG PO TABS: 0.6000 mg | ORAL_TABLET | ORAL | Status: DC | PRN

## 2015-09-19 MED ORDER — M.V.I. ADULT IV INJ
INJECTION | INTRAVENOUS | Status: DC
  Administered 2015-09-19 – 2015-09-21 (×3): via INTRAVENOUS
  Filled 2015-09-19 (×3): qty 1000

## 2015-09-19 MED ORDER — CIPROFLOXACIN HCL 250 MG PO TABS
250.0000 mg | ORAL_TABLET | Freq: Two times a day (BID) | ORAL | Status: DC
  Administered 2015-09-19: 250 mg via ORAL
  Filled 2015-09-19: qty 1

## 2015-09-19 MED ORDER — FUROSEMIDE 10 MG/ML IJ SOLN
40.0000 mg | Freq: Two times a day (BID) | INTRAMUSCULAR | Status: DC
  Administered 2015-09-19 – 2015-09-20 (×4): 40 mg via INTRAVENOUS
  Filled 2015-09-19 (×4): qty 4

## 2015-09-19 NOTE — Progress Notes (Signed)
[facility] PROGRESS NOTE    Progress Note  Jonathan Reed  [id number] DOB: 23-Jul-1945 DOA: 09/13/2015 PCP: Lorella Nimrod, MD     Brief Narrative:   Jonathan Reed is an 70 y.o. male past medical history significant neuroendocrine tumor with multiple metastatic liver disease, being admitted for lactic acidosis, He has evidence of progressive ongoing liver failure, seen by oncology who now recommend palliative care, palliative care involved plan is home hospice discharge. Patient and wife refused to go home today. Saline to morphine they're prepared mentally home a they will be agreeable to be discharged in 1-2 days.  Assessment/Plan:   Lactic acidosis likely due to Liver failure, acute:  Liver failure likely due to metastatic disease. IR attempted ultrasound-guided paracentesis but not enough fluid, Was treated with 3 days of IV Rocephin, clinically no signs of SBP, he is pain-free and afebrile.  Unfortunately his liver function is progressively declining, per oncology likely due to progressive neuroendocrine tumor. Prognosis poor. Currently DO NOT RESUSCITATE and I have have called palliative care as well. At this time plan is home hospice, patient and wife state they are not mentally ready to go home this morning and may agree to going home in 24-48 hours. They have been clearly told that we are doing nothing that cannot be done at home but they say they need time to prepare mentally.   Ischemic cardiomyopathy with history of V-tach:  Last known EF is 40%. No further workup needed. Blood pressure too low for beta blocker. Electrolytes stable. Symptom-free.  Ascites and lower extremity edema: No pain, no fever no leukocytosis. Confusion resolved, continue Xifaxan, stopped Rocephin as no signs of SBP, he is developing significant fluid overload and I will place him on IV Lasix 40 twice a day on 09/19/2015.  Metastatic malignant neuroendocrine tumor to liver [facility]) -  Appreciate  oncology's assistance. Overall prognosis very poor plan is discharge with home hospice.  Essential hypertension - Blood pressure currently soft off medications.  History of CAD and chronic atrial fibrillation. [place] vasc 2 score of at least 3. INR is therapeutic due to liver failure,  Thrombocytopenia: Due to progressive liver failure. Supportive care only.  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 Discussed with Dr. Marin Olp, patient has 20,000 colonies growing, he has already received 3 days of IV Rocephin, will complete 1 more dose of Rocephin and stop. Discontinue Cipro which was ordered by oncology on 09/19/2015.   DVT prophylaxis: INR therapeutic due to liver failure Family Communication: None present Disposition Plan/Barrier to D/C: home in 1-2 days Code Status: DNR Consults. Oncology, palliative care  IV Access:    Peripheral IV   Procedures and diagnostic studies:   US Abdomen Limited  09/17/2015  CLINICAL DATA:  History of metastatic neuroendocrine tumor to the liver. Limited ultrasound done prior to possible paracentesis. EXAM: LIMITED ABDOMEN ULTRASOUND FOR ASCITES TECHNIQUE: Limited ultrasound survey for ascites was performed in all four abdominal quadrants. COMPARISON:  None. FINDINGS: Limited ultrasound survey for ascites was performed in all four abdominal quadrants. There is a minimal amount of peritoneal fluid. There is no significant pocket of fluid to allow for a safe approach for paracentesis IMPRESSION: Minimal ascites. Read by:  Gareth Eagle, PA-C Electronically Signed   By: Lucrezia Europe M.D.   On: 09/17/2015 11:40     Medical Consultants:    None.  Anti-Infectives:   Anti-infectives    Start     Dose/Rate Route Frequency Ordered Stop   09/19/15 0800  ciprofloxacin (CIPRO) tablet 250 mg     250 mg Oral 2 times daily 09/19/15 0727 09/22/15 0759   09/16/15 1600  rifaximin (XIFAXAN) tablet 200 mg     200 mg Oral 3 times daily 09/16/15 1238     08/21/2015 1500   cefTRIAXone (ROCEPHIN) 2 g in dextrose 5 % 50 mL IVPB  Status:  Discontinued     2 g 100 mL/hr over 30 Minutes Intravenous Every 24 hours 08/24/2015 1440 09/19/15 0727       Subjective:    Jonathan Reed in bed, denies any fever or chills, denies any chest or abdominal pain, he is currently not short of breath  Objective:    Filed Vitals:   09/18/15 0519 09/18/15 1445 09/18/15 2120 09/19/15 0404  BP: 102/62 98/66 96/66  94/76  Pulse: 71 68 86 67  Temp: 98 F (36.7 C) 98.6 F (37 C) 98.3 F (36.8 C) 98.3 F (36.8 C)  TempSrc: Oral Oral Oral Oral  Resp: 18 18 18 18   Height:      Weight: 78.518 kg (173 lb 1.6 oz)   76.4 kg (168 lb 6.9 oz)  SpO2: 98% 96% 95% 95%    Intake/Output Summary (Last 24 hours) at 09/19/15 1026 Last data filed at 09/19/15 0900  Gross per 24 hour  Intake    540 ml  Output    250 ml  Net    290 ml   Filed Weights   08/29/2015 1700 09/18/15 0519 09/19/15 0404  Weight: 71.215 kg (157 lb) 78.518 kg (173 lb 1.6 oz) 76.4 kg (168 lb 6.9 oz)    Exam: General exam: In no acute distress. Respiratory system: Good air movement and clear to auscultation. Cardiovascular system: S1 & S2 heard, RRR. Gastrointestinal system: Abdomen is mildly distended, soft and nontender.  Central nervous system: Alert and oriented. No focal neurological deficits. Extremities: No pedal edema. Skin: No rashes, lesions or ulcers Psychiatry: Judgement and insight appear normal.     Data Reviewed:    Labs: Basic Metabolic Panel:  Recent Labs Lab 09/03/2015 1123 09/16/15 0522 09/17/15 0352 09/19/15 0415  NA 136 137 136 136  K 4.1 4.4 4.2 4.1  CL 102 103 103 103  CO2 22 25 23 22   GLUCOSE 110* 111* 106* 104*  BUN 34* 37* 36* 47*  CREATININE 1.83* 1.91* 1.72* 1.71*  CALCIUM 8.9 8.6* 8.7* 8.8*   GFR Estimated Creatinine Clearance: 44.6 mL/min (by C-G formula based on Cr of 1.71). Liver Function Tests:  Recent Labs Lab 09/16/15 0522 09/17/15 0352 09/19/15 0415   AST 64* 73* 72*  ALT 42 40 40  ALKPHOS 237* 243* 233*  BILITOT 14.9* 17.2* 23.5*  PROT 4.5* 4.6* 4.7*  ALBUMIN 1.6* 1.7* 1.8*   No results for input(s): LIPASE, AMYLASE in the last 168 hours.  Recent Labs Lab 09/16/15 0527 09/18/15 0750  AMMONIA 81* 51*   Coagulation profile  Recent Labs Lab 09/03/2015 1209 09/16/15 0522 09/17/15 0703 09/18/15 0405 09/19/15 0415  INR 4.55* 5.23* 2.26* 2.05* 2.02*    CBC:  Recent Labs Lab 08/20/2015 1123 09/16/15 0522 09/17/15 0352  WBC 5.4 6.1 7.6  NEUTROABS  --   --  5.7  HGB 13.4 12.4* 12.3*  HCT 35.1* 32.3* 32.5*  MCV 85.0 85.9 86.4  PLT 109* 124* 94*   Cardiac Enzymes: No results for input(s): CKTOTAL, CKMB, CKMBINDEX, TROPONINI in the last 168 hours. BNP (last 3 results) No results for input(s): PROBNP in the last 8760 hours.  CBG:  Recent Labs Lab 09/09/2015 1057  GLUCAP 98   D-Dimer: No results for input(s): DDIMER in the last 72 hours. Hgb A1c: No results for input(s): HGBA1C in the last 72 hours. Lipid Profile: No results for input(s): CHOL, HDL, LDLCALC, TRIG, CHOLHDL, LDLDIRECT in the last 72 hours. Thyroid function studies: No results for input(s): TSH, T4TOTAL, T3FREE, THYROIDAB in the last 72 hours.  Invalid input(s): FREET3 Anemia work up: No results for input(s): VITAMINB12, FOLATE, FERRITIN, TIBC, IRON, RETICCTPCT in the last 72 hours. Sepsis Labs:  Recent Labs Lab 09/02/2015 1123 09/05/2015 1152 09/16/15 0522 09/16/15 0749 09/16/15 0900 09/17/15 0352  WBC 5.4  --  6.1  --   --  7.6  LATICACIDVEN  --  4.35*  --  0.8 4.2*  --    Microbiology Recent Results (from the past 240 hour(s))  Urine culture     Status: Abnormal   Collection Time: 09/14/2015  1:32 PM  Result Value Ref Range Status   Specimen Description URINE, RANDOM  Final   Special Requests Immunocompromised  Final   Culture 20,000 COLONIES/mL ESCHERICHIA COLI (A)  Final   Report Status 09/17/2015 FINAL  Final   Organism ID, Bacteria  ESCHERICHIA COLI (A)  Final      Susceptibility   Escherichia coli - MIC*    AMPICILLIN >=32 RESISTANT Resistant     CEFAZOLIN <=4 SENSITIVE Sensitive     CEFTRIAXONE <=1 SENSITIVE Sensitive     CIPROFLOXACIN <=0.25 SENSITIVE Sensitive     GENTAMICIN <=1 SENSITIVE Sensitive     IMIPENEM <=0.25 SENSITIVE Sensitive     NITROFURANTOIN <=16 SENSITIVE Sensitive     TRIMETH/SULFA <=20 SENSITIVE Sensitive     AMPICILLIN/SULBACTAM >=32 RESISTANT Resistant     PIP/TAZO <=4 SENSITIVE Sensitive     * 20,000 COLONIES/mL ESCHERICHIA COLI     Medications:   . ciprofloxacin  250 mg Oral BID  . feeding supplement (ENSURE ENLIVE)  237 mL Oral BID BM  . furosemide  40 mg Intravenous BID  . rifaximin  200 mg Oral TID  . sodium chloride 0.9 % 1,000 mL with multivitamins adult (MVI -12) 10 mL infusion   Intravenous Q24H   Continuous Infusions:    Time spent: 25 min   LOS: 3 days   Signature  Lala Lund K M.D on 09/19/2015 at 10:26 AM  Between 7am to 7pm - Pager - [phone], After 7pm go to [email or web address] - password [facility]  [facility]  - Office  [phone]  09/19/2015, 10:26 AM

## 2015-09-19 NOTE — Progress Notes (Signed)
Unfortunately, Jonathan Reed is declined. His bilirubin is now up to 23. Despite the treatment for his urinary tract infection, his liver continues to fail.  He just is not eating much. He is sleeping more. I think he may have gotten out of bed to chair. He now has a condom catheter on.  His Escherichia coli is sensitive to most antibiotics. I'm sure we can get him on an oral antibiotic.  He still is having problems with leg swelling. It is definitely getting worse. He does not one aware compression stockings. I don't think that diuretics would be helpful given his low blood pressure.  Thankfully, he is not having pain. He is not having any nausea or vomiting. There is no bleeding.  He is off Coumadin. He will stay off Coumadin as his blood is "auto anticoagulated" because of his liver failure.  On his physical exam, his blood pressure is 9476. He is afebrile. Oxygen saturation is 95%. His lungs are clear bilaterally. Cardiac exam regular rate and rhythm. Abdomen is soft. He has massive hepatomegaly. Spleen tip may be palpable at the left costal margin. There is no obvious fluid wave. Extremities shows 2+ edema in his legs.  Unfortunately, Jonathan Reed has end-stage liver failure. His bilirubin is going up relatively quickly.  I talked his wife in private. I told her that I did not think he had more than 2 weeks. I think that he is going into liver failure quickly. I think he will decline quickly. I does think that he will fall sleep and not wake up.  She wants to get him home. However, she doesn't feel that he is ready for discharge yet. I told her I was not sure how much longer he would be able to stay in the hospital. I know the hospitalists are doing great job with him. However, this was somewhat more that can be done.  She really wants the multivitamins added to his IV fluid. I do not see any problem with this.  I'm glad hospice saw him. I know they will really help out. To me, I think a great  option for him would be [facility]. However, his wife wants to have him home. I certainly cannot argue with this.  I just feel bad for him. He is very nice. Again he has an incredible skill that not many people have.  I really do appreciate the outstanding care that he is getting from everybody up on [address]. As always, you are doing a fantastic job in caring for him and his family.  [name] E  2 Timothy 4:16-18

## 2015-09-19 NOTE — Progress Notes (Signed)
CRITICAL VALUE ALERT  Critical value received:  Total bilirubin 23.5  Date of notification:  09/19/15  Time of notification:  [id number]  Critical value read back:Yes.    Nurse who received alert:  J.Maleki Hippe  MD notified (1st page):  K.Schorr  Time of first page:  [phone]  MD notified (2nd page):  Time of second page:  Responding MD:   Time MD responded:  Will continue to monitor for new orders

## 2015-09-20 ENCOUNTER — Ambulatory Visit: Payer: Medicare Other

## 2015-09-20 ENCOUNTER — Other Ambulatory Visit: Payer: Medicare Other

## 2015-09-20 ENCOUNTER — Inpatient Hospital Stay (HOSPITAL_COMMUNITY): Payer: Medicare Other

## 2015-09-20 ENCOUNTER — Ambulatory Visit: Payer: Medicare Other | Admitting: Hematology & Oncology

## 2015-09-20 DIAGNOSIS — I10 Essential (primary) hypertension: Secondary | ICD-10-CM

## 2015-09-20 DIAGNOSIS — E875 Hyperkalemia: Secondary | ICD-10-CM

## 2015-09-20 DIAGNOSIS — I482 Chronic atrial fibrillation: Secondary | ICD-10-CM

## 2015-09-20 DIAGNOSIS — I255 Ischemic cardiomyopathy: Secondary | ICD-10-CM

## 2015-09-20 DIAGNOSIS — K7200 Acute and subacute hepatic failure without coma: Secondary | ICD-10-CM

## 2015-09-20 DIAGNOSIS — R188 Other ascites: Secondary | ICD-10-CM

## 2015-09-20 LAB — COMPREHENSIVE METABOLIC PANEL
ALBUMIN: 1.9 g/dL — AB (ref 3.5–5.0)
ALT: 45 U/L (ref 17–63)
ANION GAP: 17 — AB (ref 5–15)
AST: 126 U/L — ABNORMAL HIGH (ref 15–41)
Alkaline Phosphatase: 222 U/L — ABNORMAL HIGH (ref 38–126)
BUN: 51 mg/dL — ABNORMAL HIGH (ref 6–20)
CO2: 16 mmol/L — AB (ref 22–32)
Calcium: 8.9 mg/dL (ref 8.9–10.3)
Chloride: 100 mmol/L — ABNORMAL LOW (ref 101–111)
Creatinine, Ser: 1.73 mg/dL — ABNORMAL HIGH (ref 0.61–1.24)
GFR calc non Af Amer: 39 mL/min — ABNORMAL LOW (ref >60)
GFR, EST AFRICAN AMERICAN: 45 mL/min — AB (ref >60)
GLUCOSE: 102 mg/dL — AB (ref 65–99)
POTASSIUM: 5.7 mmol/L — AB (ref 3.5–5.1)
SODIUM: 133 mmol/L — AB (ref 135–145)
TOTAL PROTEIN: 4.2 g/dL — AB (ref 6.5–8.1)
Total Bilirubin: 26 mg/dL (ref 0.3–1.2)

## 2015-09-20 MED ORDER — MORPHINE SULFATE (CONCENTRATE) 10 MG/0.5ML PO SOLN: 5.0000 mg | ORAL | Status: DC | PRN

## 2015-09-20 MED ORDER — MORPHINE SULFATE (PF) 2 MG/ML IV SOLN: 1.0000 mg | INTRAVENOUS | Status: DC | PRN

## 2015-09-20 MED ORDER — MORPHINE SULFATE (PF) 2 MG/ML IV SOLN
1.0000 mg | INTRAVENOUS | Status: DC | PRN
  Administered 2015-09-20: 1 mg via INTRAVENOUS
  Administered 2015-09-21: 2 mg via INTRAVENOUS
  Filled 2015-09-20 (×2): qty 1

## 2015-09-20 MED ORDER — MORPHINE SULFATE (CONCENTRATE) 10 MG/0.5ML PO SOLN
5.0000 mg | ORAL | Status: DC | PRN
  Administered 2015-09-20: 5 mg via ORAL
  Filled 2015-09-20: qty 0.5

## 2015-09-20 MED ORDER — SODIUM CHLORIDE 0.9% FLUSH
10.0000 mL | INTRAVENOUS | Status: DC | PRN
  Administered 2015-09-20: 10 mL

## 2015-09-20 MED ORDER — BISACODYL 5 MG PO TBEC
10.0000 mg | DELAYED_RELEASE_TABLET | Freq: Once | ORAL | Status: AC
  Administered 2015-09-20: 10 mg via ORAL
  Filled 2015-09-20: qty 2

## 2015-09-20 MED ORDER — SENNOSIDES-DOCUSATE SODIUM 8.6-50 MG PO TABS
2.0000 | ORAL_TABLET | Freq: Two times a day (BID) | ORAL | Status: DC
  Administered 2015-09-20 (×2): 2 via ORAL
  Filled 2015-09-20 (×2): qty 2

## 2015-09-20 NOTE — Progress Notes (Signed)
Nutrition Follow-up  DOCUMENTATION CODES:   Severe malnutrition in context of chronic illness  INTERVENTION:  - Continue Ensure Enlive BID.   NUTRITION DIAGNOSIS:   Malnutrition related to chronic illness as evidenced by severe depletion of muscle mass, severe depletion of body fat, percent weight loss. -ongoing  GOAL:   Patient will meet greater than or equal to 90% of their needs -unmet  MONITOR:   PO intake, I & O's, Skin, Labs  ASSESSMENT:   Jonathan Reed is a 70 y.o. male with medical history significant for metastatic neuroendocrine tumor with innumerable liver mets who is being admitted due to lactic acidosis, dehydration and weakness. History is provided by the patient, his two daughters and his wife. They tell me he has been weak chronically for the last month or so, worsening daily  7/5  Per chart review, weight up 15 lbs since admission. Nutrition needs re-estimated based on current diagnosis, age, and using admission weight (157 lbs). Per chart review, pt consumed 25% of dinner 7/3 and 25% of breakfast, 75% of dinner 7/4. No intakes documented from today.  Notes from Hospice care and Jonathan Reed reviewed. Plan to d/c home with hospice. Therapeutic paracentesis was unable to be performed today.  RD will sign-off at this time given prognosis/life expectancy outlined in Jonathan Reed notes. If needs arise please re-consult.   Medications reviewed; MVI-NS @ 10 mL/hr, 40 mg IV Lasix BID, 2 tablets Senokot BID. Labs reviewed; Na: 133 mmol/L, K: 5.7 mmol/L, Cl: 100 mmol/L, BUN/creatinine elevated and trending up, Alk Phos and AST elevated, GFR: 39 mg/dL.   7/2 - Spoke with Jonathan Reed, daughter at bedside.  - Endorses poor appetite for ~1 month with chronic weakness.  - He does drink Ensure at home, but is only consuming 2 small meals a day.  - Wt has dropped 27#/15% in the last 2.5 months, which is significant for that time frame.  - Lower extremity edema. - Denies issues  chewing/swallowing. - Denies nausea/vomiting - Nutrition-Focused physical exam completed. Findings are severe fat depletion, severe muscle depletion, and no edema.    Diet Order:  Diet regular Room service appropriate?: Yes; Fluid consistency:: Thin  Skin:  Reviewed, no issues  Last BM:  7/4  Height:   Ht Readings from Last 1 Encounters:  08/26/2015 '5\' 11"'  (1.803 m)    Weight:   Wt Readings from Last 1 Encounters:  09/20/15 172 lb 13.5 oz (78.4 kg)    Ideal Body Weight:  78.18 kg  BMI:  Body mass index is 24.12 kg/(m^2).  Estimated Nutritional Needs:   Kcal:  2150-2350  Protein:  85-100 grams (1.2-1.4 grams/kg)  Fluid:  per MD  EDUCATION NEEDS:   No education needs identified at this time     Jonathan Matin, MS, RD, LDN Inpatient Clinical Dietitian Pager # [phone] After hours/weekend pager # [phone]

## 2015-09-20 NOTE — Progress Notes (Addendum)
PROGRESS NOTE        PATIENT DETAILS Name: Jonathan Reed Age: 70 y.o. Sex: male Date of Birth: 01-11-1946 Admit Date: 09/12/2015 Admitting Physician Mir Marry Guan, MD [id number] Reesa Chew, MD   Brief Narrative: Patient is a 70 y.o. male with neuroendocrine tumor and multiple liver metastases admitted with failure to thrive syndrome and acute liver failure. Plans are to transition to home hospice on discharge, family aware very poor overall prognosis.  Subjective: Appears very weak and lethargic., has some swelling in his leg. Intermittently confused.   Assessment/Plan: Active Problems: Acute liver failure: Likely end-stage. Secondary to metastatic disease-has history of neuroendocrine tumor. No role for any further workup, only supportive care to keep patient comfortable. Spoke with spouse this afternoon, she does not want me to start any comfort medications like morphine and Ativan at this time. She is keen on taking him home in the next 1-2 days with hospice.  Lower extremity edema: Suspect secondary to above, also has CHF. Continue Lasix for comfort.  Abdominal distention/discomfort: Suspect secondary to underlying neuroendocrine tumor and liver metastases. No significant ascites seen on ultrasound for drainage. Best served by transitioning to full comfort care-spouse is not in favor of starting Ativan or morphine at this time. If he deteriorates further, or becomes overtly short of breath/pain worsens-we will likely need to start Ativan and morphine, spouse and family will be in the hospital tomorrow for family meeting.  Acute kidney injury: Multifactorial-suspect due to liver failure, poor oral intake. Only supportive care, no further workup required. Anticipate renal failure to worsen over the next few days.  Ischemic cardiomyopathy with history of V-tach: Continue Lasix, has AICD in place.  Hypertension: Blood pressure soft/borderline hypotensive-no  role for antihypertensives.  History of atrial fibrillation: Chads 2 vascular for at least 3, no role for anticoagulation in this setting.  Escherichia coli UTI: Completed course of antibiotics, no longer on any IV antibiotics.  History of metastatic neuroendocrine tumor: Seen by oncology-recommendations are supportive care and transition to hospice care on discharge. If patient continues to deteriorate rapidly, may benefit from residential hospice placement.   Palliative care: Unfortunate 70 year old male with metastatic neuroendocrine tumor-now with rapid onset of acute liver failure and acute kidney injury. Deteriorating rapidly, spoke with patient's spouse over the phone today-explained that patient likely will best benefit by transitioning to full comfort measures and initiation of morphine and Ativan. Spouse is aware of prognoses, DO NOT RESUSCITATE in place-she is aware that patient will likely pass in the next 1-2 weeks. However she is very hesitant to initiate morphine or Ativan at this time, she will be here in the hospital tomorrow with other family members and we will discuss further. I did offer her choice of residential hospice, but it seems that the family has made up their mind to take him home with hospice care. Prognosis is very poor, life expectancy thought to be only a few days to 1 or 2 weeks.  Addendum: 3 PM Spoke with patient's 2 daughters at bedside-they are the healthcare power of attorney-they are aware of patient's very poor overall prognoses and very limited likely expectancy as explained above. They initially wanted to take patient home, but with continued deterioration, the history and that they will be able to take care of the patient. They now would prefer residential hospice instead of home hospice. They  also in agreement with starting as needed morphine, Ativan the comfort medications.  DVT Prophylaxis: SCD's  Code Status: DNR  Family Communication: Spoke with  Spouse over phone  Disposition Plan: Remain inpatient-home with hospice in 1-2 days  Antimicrobial agents: IV Rocephin   Procedures: None  CONSULTS:  hematology/oncology  Time spent: 35 minutes-Greater than 50% of this time was spent in counseling, explanation of diagnosis, planning of further management, and coordination of care.  MEDICATIONS: Anti-infectives    Start     Dose/Rate Route Frequency Ordered Stop   09/19/15 1600  cefTRIAXone (ROCEPHIN) 1 g in dextrose 5 % 50 mL IVPB     1 g 100 mL/hr over 30 Minutes Intravenous  Once 09/19/15 1033 09/19/15 1801   09/19/15 0800  ciprofloxacin (CIPRO) tablet 250 mg  Status:  Discontinued     250 mg Oral 2 times daily 09/19/15 0727 09/19/15 1033   09/16/15 1600  rifaximin (XIFAXAN) tablet 200 mg     200 mg Oral 3 times daily 09/16/15 1238     09/05/2015 1500  cefTRIAXone (ROCEPHIN) 2 g in dextrose 5 % 50 mL IVPB  Status:  Discontinued     2 g 100 mL/hr over 30 Minutes Intravenous Every 24 hours 08/20/2015 1440 09/19/15 0727      Scheduled Meds: . feeding supplement (ENSURE ENLIVE)  237 mL Oral BID BM  . furosemide  40 mg Intravenous BID  . rifaximin  200 mg Oral TID  . senna-docusate  2 tablet Oral BID  . sodium chloride 0.9 % 1,000 mL with multivitamins adult (MVI -12) 10 mL infusion   Intravenous Q24H   Continuous Infusions:  PRN Meds:.colchicine, diphenoxylate-atropine, [DISCONTINUED] ondansetron **OR** ondansetron (ZOFRAN) IV, prochlorperazine, sodium chloride flush   PHYSICAL EXAM: Vital signs: Filed Vitals:   09/19/15 1342 09/19/15 2155 09/20/15 0613 09/20/15 1329  BP: 90/62 85/66 86/59  89/69  Pulse: 74 78 85 79  Temp: 98.2 F (36.8 C) 97.5 F (36.4 C) 97.8 F (36.6 C) 97.3 F (36.3 C)  TempSrc: Oral Oral Oral Oral  Resp: 18 18 18 18   Height:      Weight:   78.4 kg (172 lb 13.5 oz)   SpO2: 93% 95% 98% 95%   Filed Weights   09/18/15 0519 09/19/15 0404 09/20/15 0613  Weight: 78.518 kg (173 lb 1.6 oz) 76.4 kg  (168 lb 6.9 oz) 78.4 kg (172 lb 13.5 oz)   Body mass index is 24.12 kg/(m^2).   Gen Exam: Awake, But lethargic and very sleepy. In mild distress-mildly tachypneic. Neck: Supple Chest: B/L Clear anteriorly. CVS: S1 S2 Regular, no murmurs.  Abdomen: soft, BS +, distended.  Extremities:++ edema, lower extremities warm to touch. Neurologic: Non Focal-but with generalized weakness    LABORATORY DATA: CBC:  Recent Labs Lab 08/30/2015 1123 09/16/15 0522 09/17/15 0352  WBC 5.4 6.1 7.6  NEUTROABS  --   --  5.7  HGB 13.4 12.4* 12.3*  HCT 35.1* 32.3* 32.5*  MCV 85.0 85.9 86.4  PLT 109* 124* 94*    Basic Metabolic Panel:  Recent Labs Lab 08/18/2015 1123 09/16/15 0522 09/17/15 0352 09/19/15 0415 09/20/15 0515  NA 136 137 136 136 133*  K 4.1 4.4 4.2 4.1 5.7*  CL 102 103 103 103 100*  CO2 22 25 23 22  16*  GLUCOSE 110* 111* 106* 104* 102*  BUN 34* 37* 36* 47* 51*  CREATININE 1.83* 1.91* 1.72* 1.71* 1.73*  CALCIUM 8.9 8.6* 8.7* 8.8* 8.9    GFR: Estimated Creatinine  Clearance: 44.1 mL/min (by C-G formula based on Cr of 1.73).  Liver Function Tests:  Recent Labs Lab 09/16/15 0522 09/17/15 0352 09/19/15 0415 09/20/15 0515  AST 64* 73* 72* 126*  ALT 42 40 40 45  ALKPHOS 237* 243* 233* 222*  BILITOT 14.9* 17.2* 23.5* 26.0*  PROT 4.5* 4.6* 4.7* 4.2*  ALBUMIN 1.6* 1.7* 1.8* 1.9*   No results for input(s): LIPASE, AMYLASE in the last 168 hours.  Recent Labs Lab 09/16/15 0527 09/18/15 0750  AMMONIA 81* 51*    Coagulation Profile:  Recent Labs Lab 08/30/2015 1209 09/16/15 0522 09/17/15 0703 09/18/15 0405 09/19/15 0415  INR 4.55* 5.23* 2.26* 2.05* 2.02*    Cardiac Enzymes: No results for input(s): CKTOTAL, CKMB, CKMBINDEX, TROPONINI in the last 168 hours.  BNP (last 3 results) No results for input(s): PROBNP in the last 8760 hours.  HbA1C: No results for input(s): HGBA1C in the last 72 hours.  CBG:  Recent Labs Lab 08/28/2015 1057  GLUCAP 98     Lipid Profile: No results for input(s): CHOL, HDL, LDLCALC, TRIG, CHOLHDL, LDLDIRECT in the last 72 hours.  Thyroid Function Tests: No results for input(s): TSH, T4TOTAL, FREET4, T3FREE, THYROIDAB in the last 72 hours.  Anemia Panel: No results for input(s): VITAMINB12, FOLATE, FERRITIN, TIBC, IRON, RETICCTPCT in the last 72 hours.  Urine analysis:    Component Value Date/Time   COLORURINE ORANGE* 08/19/2015 1214   APPEARANCEUR CLOUDY* 08/21/2015 1214   APPEARANCEUR Turbid* 05/24/2015 1025   LABSPEC 1.023 08/17/2015 1214   PHURINE 5.5 08/28/2015 1214   GLUCOSEU NEGATIVE 09/07/2015 1214   HGBUR SMALL* 08/23/2015 1214   BILIRUBINUR LARGE* 08/24/2015 1214   BILIRUBINUR Positive* 05/24/2015 1025   [place] 08/29/2015 1214   PROTEINUR NEGATIVE 09/11/2015 1214   PROTEINUR 1+* 05/24/2015 1025   UROBILINOGEN 0.2 06/29/2013 1016   NITRITE POSITIVE* 09/08/2015 1214   NITRITE Positive* 05/24/2015 1025   LEUKOCYTESUR SMALL* 08/25/2015 1214   LEUKOCYTESUR 1+* 05/24/2015 1025    Sepsis Labs: Lactic Acid, Venous    Component Value Date/Time   LATICACIDVEN 4.2* 09/16/2015 0900    MICROBIOLOGY: Recent Results (from the past 240 hour(s))  Urine culture     Status: Abnormal   Collection Time: 08/28/2015  1:32 PM  Result Value Ref Range Status   Specimen Description URINE, RANDOM  Final   Special Requests Immunocompromised  Final   Culture 20,000 COLONIES/mL ESCHERICHIA COLI (A)  Final   Report Status 09/17/2015 FINAL  Final   Organism ID, Bacteria ESCHERICHIA COLI (A)  Final      Susceptibility   Escherichia coli - MIC*    AMPICILLIN >=32 RESISTANT Resistant     CEFAZOLIN <=4 SENSITIVE Sensitive     CEFTRIAXONE <=1 SENSITIVE Sensitive     CIPROFLOXACIN <=0.25 SENSITIVE Sensitive     GENTAMICIN <=1 SENSITIVE Sensitive     IMIPENEM <=0.25 SENSITIVE Sensitive     NITROFURANTOIN <=16 SENSITIVE Sensitive     TRIMETH/SULFA <=20 SENSITIVE Sensitive      AMPICILLIN/SULBACTAM >=32 RESISTANT Resistant     PIP/TAZO <=4 SENSITIVE Sensitive     * 20,000 COLONIES/mL ESCHERICHIA COLI    RADIOLOGY STUDIES/RESULTS: Ct Abdomen Pelvis W Contrast  08/25/2015  CLINICAL DATA:  70 year old male with history of metastatic neuroendocrine carcinoma with liver metastasis status post embolization and chemotherapy. Followup study. Weight loss. EXAM: CT ABDOMEN AND PELVIS WITH CONTRAST TECHNIQUE: Multidetector CT imaging of the abdomen and pelvis was performed using the standard protocol following bolus administration of intravenous contrast.  CONTRAST:  133mL ISOVUE-300 IOPAMIDOL (ISOVUE-300) INJECTION 61% COMPARISON:  Multiple priors, most recently CT the abdomen and pelvis 05/31/2015. FINDINGS: Lower chest: Trace left pleural effusion lying dependently. Atherosclerotic calcifications in the left main, left anterior descending, left circumflex and right coronary arteries. Pacemaker leads terminating in the right atrium, right ventricular apex and overlying the anterior wall of the left ventricle via the coronary sinus and coronary veins. Elevation of the right hemidiaphragm related to severe hepatomegaly. Hepatobiliary: Liver is incompletely visualized, but is severely enlarged, estimated to measure approximately 29 cm craniocaudal. There are innumerable hypervascular areas within the liver, which are difficult to directly compare with the prior study secondary to differences in bolus timing in the indistinct nature of the multiple lesions. However, subjectively there is an increased number, size and confluence of these numerous hypervascular areas, strongly suggestive of progression of disease in the liver. The largest confluence of lesions is in segment 4 adjacent to the falciform ligament. There are also multiple well-defined low-attenuation lesions within the liver which likely represent cysts, although some of these could represent treated metastatic lesions with areas of  internal necrosis. No intra or extrahepatic biliary ductal dilatation. Gallbladder is nearly completely contracted. Tiny gallstone lying dependently in the lumen of the gallbladder. Embolization coil noted in the porta hepatis. Pancreas: No pancreatic mass. No pancreatic ductal dilatation. No pancreatic or peripancreatic fluid or inflammatory changes. Spleen: Numerous calcified granulomas in the spleen. Adrenals/Urinary Tract: Bilateral adrenal glands are normal in appearance. Multifocal areas of cortical thinning in the kidneys bilaterally (left greater than right), likely secondary to prior infection/infarction. No suspicious renal lesions. No hydroureteronephrosis. Urinary bladder is nearly decompressed, but otherwise unremarkable in appearance. Stomach/Bowel: Stomach is nearly completely decompressed. No pathologic dilatation of small bowel or colon. Normal appendix. Vascular/Lymphatic: Atherosclerosis throughout the abdominal and pelvic vasculature, without evidence of aneurysm or dissection. No definite lymphadenopathy noted in the abdomen or pelvis. Reproductive: Prostate gland and seminal vesicles are unremarkable in appearance. Other: Small moderate volume of ascites.  No pneumoperitoneum. Musculoskeletal: There are no aggressive appearing lytic or blastic lesions noted in the visualized portions of the skeleton. IMPRESSION: 1. Findings on today's examination again suggests progression of metastatic disease in the liver, as evidenced by increased number, size and confluence of hypervascular areas. 2. Small to moderate volume of ascites again noted. 3. Trace left pleural effusion, new compared to the prior study. 4. Additional incidental findings, as above. Electronically Signed   By: Vinnie Langton M.D.   On: 08/25/2015 10:25   US Abdomen Limited  09/20/2015  CLINICAL DATA:  Evaluate ascites for possible paracentesis. EXAM: LIMITED ABDOMEN ULTRASOUND FOR ASCITES TECHNIQUE: Limited ultrasound survey for  ascites was performed in all four abdominal quadrants. COMPARISON:  09/17/2015 FINDINGS: There is small amount of ascites predominantly collects in the left lower quadrant. This is similar to the earlier study. This was not felt sufficient to allow for safe paracentesis. Numerous hypoechoic liver lesions are again noted. IMPRESSION: 1. Small amount of ascites felt insufficient to allow for safe paracentesis. No significant change from the previous limited abdominal ultrasound. Electronically Signed   By: Lajean Manes M.D.   On: 09/20/2015 11:03   US Abdomen Limited  09/17/2015  CLINICAL DATA:  History of metastatic neuroendocrine tumor to the liver. Limited ultrasound done prior to possible paracentesis. EXAM: LIMITED ABDOMEN ULTRASOUND FOR ASCITES TECHNIQUE: Limited ultrasound survey for ascites was performed in all four abdominal quadrants. COMPARISON:  None. FINDINGS: Limited ultrasound survey for ascites was performed  in all four abdominal quadrants. There is a minimal amount of peritoneal fluid. There is no significant pocket of fluid to allow for a safe approach for paracentesis IMPRESSION: Minimal ascites. Read by:  Gareth Eagle, PA-C Electronically Signed   By: Lucrezia Europe M.D.   On: 09/17/2015 11:40   Dg Chest Port 1 View  09/20/2015  CLINICAL DATA:  Malignant ascites.  Liver failure. EXAM: PORTABLE CHEST 1 VIEW COMPARISON:  September 16, 2015 FINDINGS: There is persistent elevation of the right hemidiaphragm. There is focal airspace consolidation in the right base. Lungs elsewhere clear. Heart is upper normal in size with pulmonary vascularity within normal limits. Central catheter tip is in the superior vena cava. Pacemaker leads are attached to the right atrium, right ventricle, and left ventricle. There is atherosclerotic calcification in the aorta. There is a calcified azygos region lymph node. No frank adenopathy is evident radiographically. IMPRESSION: Airspace consolidation consistent with pneumonia  right base. Elevation right hemidiaphragm. Lungs elsewhere clear. There is aortic atherosclerosis. Stable cardiac silhouette. Calcified azygos lymph node consistent with prior granulomatous disease. Electronically Signed   By: Lowella Grip III M.D.   On: 09/20/2015 08:05   Dg Chest Port 1 View  09/16/2015  CLINICAL DATA:  Status post central line placement EXAM: PORTABLE CHEST 1 VIEW COMPARISON:  09/14/2015 FINDINGS: Pacing device is again identified and stable. Cardiac shadow is stable. Right basilar infiltrate is again seen and stable. A new right PICC line is noted at cavoatrial junction in satisfactory position. IMPRESSION: Status post right PICC line placement in satisfactory position. The remainder of the exam is stable from the previous day. Electronically Signed   By: Inez Catalina M.D.   On: 09/16/2015 09:36   Dg Chest Portable 1 View  09/14/2015  CLINICAL DATA:  Increasing shortness of breath over the last 3 weeks, history of known liver carcinoma EXAM: PORTABLE CHEST 1 VIEW COMPARISON:  06/01/2014 FINDINGS: Cardiac shadow is stable. A defibrillator is again noted. Elevation of the right hemidiaphragm is noted with right basilar atelectatic changes. Small effusion cannot be totally excluded. No bony abnormality is noted. IMPRESSION: Right basilar atelectasis/infiltrate with likely small effusion. Electronically Signed   By: Inez Catalina M.D.   On: 08/30/2015 12:09     LOS: 4 days   Oren Binet, MD  [facility] Pager:[phone]  If 7PM-7AM, please contact night-coverage [email or web address] Password [email or web address] 09/20/2015, 2:30 PM

## 2015-09-20 NOTE — Progress Notes (Signed)
Jonathan Reed is really no better.  He looks like he is accumulating ascites.  He is having a little trouble breathing. It might be that his diaphragm cannot move all that well secondary to ascites. It also might be from his hepatomegaly.  I think that a paracentesis or at least an attempt at a paracentesis would not be a bad idea.  He has more swelling in his legs. He will not wear the compression stockings. He was started on some Lasix. Hopefully this might help a little bit. We had to be careful that it does not drop his blood pressure even more.  I'll also get a chest x-ray on him. I want to make sure that there is no obvious pneumonia. He is definitely at risk for pneumonia given his hepatic failure and being bedbound.   His labs today shows bilirubin up to 26. His potassium is 5.7. His creatinine is up a little bit at 1.73.  I spoke to the family yesterday. I told him that I just do not think that he would survive more than 2 weeks. This actually looks like it might be less than that. I think the hyperkalemia is clearly reflective of hepatic failure.  He is a little constipated. We will try to increase his Senokot.  He is not eating much. Thankfully, there is no nausea or vomiting.  His blood pressure is only 86/59. His temperature is 97.8. His pulse is 85. His lungs show some decrease at the bases. Cardiac exam regular rate and rhythm with no murmurs, rubs or bruits. Abdomen is distended. He does have a fluid wave. He has a hepatomegaly. Extremities shows 2+ edema in his legs.  [name] has end-stage hepatic failure. He has the neuroendocrine malignancy which is contributing to this.  I really think that he has to stay one or 2 more days. I do think it is very hard for him to go home right now given his breathing issues.  I know that it would help his family out a whole lot if he can stay another day.  I know that he is getting very good care from everybody up on [address]. You all are doing a  fantastic job with him and his family!!!  [place] 54:7

## 2015-09-20 NOTE — Care Management Important Message (Signed)
Important Message  Patient Details  Name: SHEDRICK ARTHER MRN: [id number] Date of Birth: [birth year]   Medicare Important Message Given:  Yes    Camillo Flaming 09/20/2015, 11:11 AMImportant Message  Patient Details  Name: CALVION DELEE MRN: [id number] Date of Birth: 1945/10/25   Medicare Important Message Given:  Yes    Camillo Flaming 09/20/2015, 11:11 AM

## 2015-09-20 NOTE — Progress Notes (Signed)
[facility] Liaison Note:  Spoke to spouse, Hinton Dyer on the phone to discuss hospice services. Per discussion, plan is to go home with the support of hospice in the next couple of days.  Wife stated they wish to keep the AICD turned on at time if discharge.  Hospice will continue to discuss this with the family.  Family is also requesting [facility] for transport home, due to his continued weakness.  Please send signed completed DNR form home with patient.  Patient will need prescriptions for discharge comfort medications.   DME needs discussed and family requested walker, cane and W/C that was delivered to the home yesterday.  Completed discharge summary will need to be faxed to [facility] at [phone] when final.  Please notify [facility] when patient is ready to leave unit at discharge-call [phone].   [facility] information and contact numbers have been given to patient during visit.   Above information shared with Cookie, CMRN.  Please call with any questions.   Thank You,  Freddi Starr RN, [facility] Liaison  [phone]

## 2015-09-20 NOTE — Progress Notes (Signed)
Pt states he feels very constipated, on-call paged for laxative. Laxative administered, will continue to monitor.

## 2015-09-20 NOTE — Progress Notes (Addendum)
Patient ID: Jonathan Reed, male   DOB: 1945/08/21, 70 y.o.   MRN: [id number] Pt presented to [place] dept today for therapeutic paracentesis. On limited ultrasound of abdomen in all 4 quadrants there is only a small amount of ascites present and not enough to warrant therapeutic tap. Procedure was canceled. Patient informed. Once patient accumulates significant amount ascites can perform therapeutic paracentesis.

## 2015-09-20 NOTE — Progress Notes (Signed)
Physical Therapy Treatment Patient Details Name: Jonathan Reed MRN: [id number] DOB: 02-29-1946 Today's Date: 09/20/2015    History of Present Illness 70 y.o. male past medical history significant neuroendocrine tumor with multiple metastatic liver disease and admitted for Lactic acidosis likely due to Liver failure.    PT Comments    Assisted pt OOB to amb in hallway used B Platform EVA walker due to gait instbility and B LE weakness.    Follow Up Recommendations  SNF     Equipment Recommendations       Recommendations for Other Services       Precautions / Restrictions Precautions Precautions: Fall Restrictions Weight Bearing Restrictions: No    Mobility  Bed Mobility Overal bed mobility: Needs Assistance Bed Mobility: Supine to Sit     Supine to sit: Mod assist     General bed mobility comments: slight assist for LEs to EOB and trunk upright  Transfers Overall transfer level: Needs assistance Equipment used: Bilateral platform walker Transfers: Sit to/from Stand Sit to Stand: Mod assist;Max assist         General transfer comment: + 2 assist off elvated bed  Ambulation/Gait Ambulation/Gait assistance: Mod assist;+2 physical assistance;+2 safety/equipment Ambulation Distance (Feet): 68 Feet Assistive device: Bilateral platform walker Gait Pattern/deviations: Step-to pattern;Decreased step length - right;Decreased step length - left;Trunk flexed;Narrow base of support Gait velocity: decreased   General Gait Details: used B platform EVA walker for increased support with recliner following for safety.     Stairs            Wheelchair Mobility    Modified Rankin (Stroke Patients Only)       Balance                                    Cognition Arousal/Alertness: Awake/alert Behavior During Therapy: WFL for tasks assessed/performed Overall Cognitive Status: Within Functional Limits for tasks assessed                       Exercises      General Comments        Pertinent Vitals/Pain Pain Assessment: No/denies pain    Home Living                      Prior Function            PT Goals (current goals can now be found in the care plan section) Progress towards PT goals: Progressing toward goals    Frequency  Min 3X/week    PT Plan Current plan remains appropriate    Co-evaluation             End of Session   Activity Tolerance: Patient limited by fatigue Patient left: with call bell/phone within reach;with chair alarm set;in bed     Time: [id number] PT Time Calculation (min) (ACUTE ONLY): 26 min  Charges:  $Gait Training: 8-22 mins $Therapeutic Activity: 8-22 mins                    G Codes:      Rica Koyanagi  PTA [facility]  Acute  Rehab Pager      [phone]

## 2015-09-21 DIAGNOSIS — C7B8 Other secondary neuroendocrine tumors: Secondary | ICD-10-CM

## 2015-09-21 DIAGNOSIS — R451 Restlessness and agitation: Secondary | ICD-10-CM

## 2015-09-21 DIAGNOSIS — N179 Acute kidney failure, unspecified: Secondary | ICD-10-CM | POA: Insufficient documentation

## 2015-09-21 DIAGNOSIS — K767 Hepatorenal syndrome: Secondary | ICD-10-CM

## 2015-09-21 DIAGNOSIS — G9340 Encephalopathy, unspecified: Secondary | ICD-10-CM

## 2015-09-21 DIAGNOSIS — E162 Hypoglycemia, unspecified: Secondary | ICD-10-CM

## 2015-09-21 LAB — COMPREHENSIVE METABOLIC PANEL
ALBUMIN: 2 g/dL — AB (ref 3.5–5.0)
ALT: 82 U/L — ABNORMAL HIGH (ref 17–63)
ANION GAP: 23 — AB (ref 5–15)
AST: 226 U/L — ABNORMAL HIGH (ref 15–41)
Alkaline Phosphatase: 246 U/L — ABNORMAL HIGH (ref 38–126)
BILIRUBIN TOTAL: 29.3 mg/dL — AB (ref 0.3–1.2)
BUN: 63 mg/dL — ABNORMAL HIGH (ref 6–20)
CHLORIDE: 102 mmol/L (ref 101–111)
CO2: 11 mmol/L — ABNORMAL LOW (ref 22–32)
Calcium: 9.1 mg/dL (ref 8.9–10.3)
Creatinine, Ser: 3.47 mg/dL — ABNORMAL HIGH (ref 0.61–1.24)
GFR calc Af Amer: 20 mL/min — ABNORMAL LOW (ref >60)
GFR, EST NON AFRICAN AMERICAN: 17 mL/min — AB (ref >60)
Glucose, Bld: 31 mg/dL — CL (ref 65–99)
POTASSIUM: 6.5 mmol/L — AB (ref 3.5–5.1)
Sodium: 136 mmol/L (ref 135–145)
TOTAL PROTEIN: 5.1 g/dL — AB (ref 6.5–8.1)

## 2015-09-21 LAB — GLUCOSE, CAPILLARY: Glucose-Capillary: 93 mg/dL (ref 65–99)

## 2015-09-21 MED ORDER — HALOPERIDOL 0.5 MG PO TABS
0.5000 mg | ORAL_TABLET | ORAL | Status: DC | PRN
  Filled 2015-09-21: qty 1

## 2015-09-21 MED ORDER — DEXTROSE 50 % IV SOLN
INTRAVENOUS | Status: AC
  Filled 2015-09-21: qty 50

## 2015-09-21 MED ORDER — GLYCOPYRROLATE 0.2 MG/ML IJ SOLN
0.2000 mg | INTRAMUSCULAR | Status: DC | PRN
  Filled 2015-09-21: qty 1

## 2015-09-21 MED ORDER — SODIUM CHLORIDE 0.9 % IV SOLN
1.0000 mg/h | INTRAVENOUS | Status: DC
  Administered 2015-09-21: 1 mg/h via INTRAVENOUS
  Administered 2015-09-21: 2 mg/h via INTRAVENOUS
  Filled 2015-09-21: qty 10

## 2015-09-21 MED ORDER — ONDANSETRON 4 MG PO TBDP: 4.0000 mg | ORAL_TABLET | Freq: Four times a day (QID) | ORAL | Status: DC | PRN

## 2015-09-21 MED ORDER — GLYCOPYRROLATE 1 MG PO TABS
1.0000 mg | ORAL_TABLET | ORAL | Status: DC | PRN
  Filled 2015-09-21: qty 1

## 2015-09-21 MED ORDER — LORAZEPAM 2 MG/ML IJ SOLN
1.0000 mg | INTRAMUSCULAR | Status: DC | PRN
  Administered 2015-09-21: 1 mg via INTRAVENOUS
  Filled 2015-09-21: qty 1

## 2015-09-21 MED ORDER — ONDANSETRON HCL 4 MG/2ML IJ SOLN: 4.0000 mg | Freq: Four times a day (QID) | INTRAMUSCULAR | Status: DC | PRN

## 2015-09-21 MED ORDER — POLYVINYL ALCOHOL 1.4 % OP SOLN
1.0000 [drp] | Freq: Four times a day (QID) | OPHTHALMIC | Status: DC | PRN
  Filled 2015-09-21: qty 15

## 2015-09-21 MED ORDER — BIOTENE DRY MOUTH MT LIQD: 15.0000 mL | OROMUCOSAL | Status: DC | PRN

## 2015-09-21 MED ORDER — DEXTROSE 50 % IV SOLN
50.0000 mL | Freq: Once | INTRAVENOUS | Status: AC
  Administered 2015-09-21: 50 mL via INTRAVENOUS

## 2015-09-21 MED ORDER — HALOPERIDOL LACTATE 5 MG/ML IJ SOLN: 0.5000 mg | INTRAMUSCULAR | Status: DC | PRN

## 2015-09-21 MED ORDER — HALOPERIDOL LACTATE 2 MG/ML PO CONC
0.5000 mg | ORAL | Status: DC | PRN
  Filled 2015-09-21: qty 0.3

## 2015-09-25 ENCOUNTER — Other Ambulatory Visit: Payer: Self-pay | Admitting: Hematology and Oncology

## 2015-10-17 NOTE — Progress Notes (Signed)
[employer] magnet placed on patient's left chest over defibrillator per MD verbal order. [employer] notified by MD and will be coming to turn defibrillator off. Family aware of plan and agrees.

## 2015-10-17 NOTE — Progress Notes (Signed)
CRITICAL VALUE ALERT  Critical value received:  Potassium 6.5,  Glucose 31, total bilirubin 29.3  Date of notification:  10-01-15  Time of notification:  0510  Critical value read back:Yes.    Nurse who received alert:  Aldona Bar, RN  MD notified (1st page):  Lamar Blinks, NP  Time of first page:  0510  MD notified (2nd page):  Time of second page:  Responding MD:    Time MD responded:

## 2015-10-17 NOTE — Progress Notes (Signed)
I think it is apparent that Mr. Jonathan Reed will not survive the next day or so. His renal function is starting to deteriorate. I suspect he is going into hepatorenal syndrome. He is becoming hypo-glycemic. His bilirubin is close to 30 now.  He is still awake but I don't think he is much alert. He may try to answer in 1 word answers. He is a little agitated. I think he will benefit from some Ativan, when necessary.  Our goal at this point is complete comfort. Hopefully, I will think he will hurt. He is going through the stages of hepatic failure with encephalopathy.  His potassium is now 6.5.  I do not know if this would be an issue but he does have that pacemaker defibrillator. I do not know if this has to be turned off. I really don't think he needs to be defibrillated if he goes into a cardiac arrest.  I do not think he needs a cardiac monitor.  He's having more little weight leg swelling. He does not need Lasix.  I think they try to get fluid off of his stay but this was not feasible. His abdomen is not as distended today.  He is more hypotensive. His blood pressure 73/50. He is afebrile. His lungs still sound clear. He did have a chest x-ray stay which showed some pneumonia. This is not surprising. I don't think he is symptomatic with it. As such, I don't think he has to be treated with antibiotics. Cardiac exam is regular rate and rhythm. Abdomen is soft. Again it does not appear distended. I do not palpate any obvious fluid wave. He does have the hepatomegaly. Extremities shows 2-3+ edema in his legs.  His 2 daughters are with this this morning. We all had a very good prayer session. They are ready to "let him go". This is been very difficult. He is such a nice guy. Again, we had a very meaningful prayer session.  I suspect that today will be his last stay on this earth. I would suspect that he will go peacefully. I have to believe that he will just lapse into a coma.  I very much appreciate  all the great care that he is getting from the staff on 4 E. As always, he will are doing a fantastic job. His family is very appreciative of the efforts you are making in order to keep him comfortable.   Lum Keas  2 Timothy 4:16-18

## 2015-10-17 NOTE — Discharge Summary (Addendum)
Death Summary  JEANCARLO TOURAY [id number] DOB: 07-05-1945 DOA: 2015-09-25  PCP: Lorella Nimrod, MD  Admit date: Sep 25, 2015 Date of Death: 10-01-15  Final Diagnoses:  Active Problems:   Ischemic cardiomyopathy with history of Vtach   Metastatic malignant neuroendocrine tumor to liver [facility])   Essential hypertension   CAD (coronary artery disease)   Dehydration   Chronic a-fib (HCC)   Liver failure, acute   Lactic acid acidosis   Acute renal failure (HCC)   History of present illness:  Patient is a 70 y.o. male with neuroendocrine tumor and multiple liver metastases admitted with failure to thrive syndrome and acute liver failure. He continued to rapidly deteriorate,and developed acute renal failure. See below for further details.  Hospital Course:  Brief Narrative Patient is a 70 y.o. male with neuroendocrine tumor and multiple liver metastases admitted with failure to thrive syndrome and acute liver failure. He continued to rapidly deteriorate,and developed acute renal failure. He was subsequently transitioned to full comfort measures, and expired on 10/01/2015 at 1427 hrs.  Hospital course by Problem list: Acute liver failure: Thought to be end-stage. Secondary to metastatic disease-has history of neuroendocrine tumor. As noted above, transitioned to full comfort measures, started on morphine infusion. Patient subsequently expired on 2015-10-01 at 1427 hrs.  Acute renal failure: Multifactorial-suspect due to liver failure, poor oral intake. Rapidly worsened with hyperkalemia and metabolic acidosis.As noted above, transitioned to full comfort measures  Hyperkalemia with metabolic acidosis: Secondary to worsening renal function, comfort measures initiated, this was not treated.  History of metastatic neuroendocrine tumor: Seen by oncology during this hospital stay-recommendations were for  supportive care and transition to hospice care on discharge. Initially home hospice was contemplated,  but quit rapid deterioration-patient was then transitioned to full comfort measures while in the hospital. He was started on a morphine infusion, Patient subsequently expired on 2015/10/01 at 1427 hrs.  Ischemic cardiomyopathy with history of V-tach: Had a AICD in place-[employer] rep was contacted and device was programmed off.   Severe malnutrition in context of chronic illness  Signed:  Damonie Furney  [facility] 2015-10-01, 3:42 PM

## 2015-10-17 NOTE — Progress Notes (Signed)
Post mortem care performed and patient's body transported to [facility]. Daughter POA refused to have patient's eyes donated- no eye care done. Funeral home chosen is [facility] in [place], [place]. Morphine gtt wasted in sink with RN Alvy Beal.

## 2015-10-17 NOTE — Progress Notes (Signed)
[facility] Hospital Liaison Note:  Followed-up with family to provide emotional support.  Daughters at bedside and tearful.  Patient's breathing appears agonal.  Plan is to monitor at hospital overnight, and hospice will evaluate for residential placement tomorrow if patient stable for transport and bed available.  Offered emotional support and left contact information with daughters if any hospice-related needs arise.  Thank you, Freddi Starr RN, [place] [facility] Liaison [phone]

## 2015-10-17 NOTE — Progress Notes (Signed)
Hypoglycemic Event  CBG: critical value from lab, glucose 31  Treatment: D50 IV 50 mL  Symptoms: decreased LOC   Follow-up CBG: Time: 0530 CBG Result:  93  Possible Reasons for Event: Inadequate meal intake  Comments/MD notified:    Arville Lime

## 2015-10-17 NOTE — Progress Notes (Signed)
Chaplain consulted through nursing for support around pt death.    Providing grief support at bedside with extended family.

## 2015-10-17 NOTE — Progress Notes (Signed)
PROGRESS NOTE        PATIENT DETAILS Name: Jonathan Reed Age: 70 y.o. Sex: male Date of Birth: 10/12/1945 Admit Date: 09/14/2015 Admitting Physician Mir Marry Guan, MD AY:[id number] Reesa Chew, MD   Brief Narrative: Patient is a 70 y.o. male with neuroendocrine tumor and multiple liver metastases admitted with failure to thrive syndrome and acute liver failure. He continues to rapidly deteriorate, and has been transitioned to full comfort measures. Plans are to monitor for 1 additional day as patient as rapidly deteriorating,and reassess tomorrow to see if patient stable for transfer to residential hospice.   Subjective: Much more lethargic compared to yesterday-breathing heavily this morning. Continues to have worsening swelling of his legs.   Assessment/Plan: Active Problems: Acute liver failure: Likely end-stage. Secondary to metastatic disease-has history of neuroendocrine tumor. No role for any further workup, only supportive care to keep patient comfortable. After speaking with the patient's daughters yesterday, and early this morning, we have transitioned to full comfort measures. Since he appears much more comfortable today, and tachypnea, I have started him on a morphine infusion, he is deteriorating rapidly and may even pass in the next day or so, hence we will continue inpatient monitoring and reassess tomorrow morning to see if he would benefit from being transferred to residential hospice. Family is agreeable with this plan.  Lower extremity edema: Suspect secondary to above, also has CHF. Rapidly deteriorating, no role for intravenous diuretics at this time-full comfort care.  Abdominal distention/discomfort: Suspect secondary to underlying neuroendocrine tumor and liver metastases. No significant ascites seen on ultrasound for drainage. Since continued to deteriorate rapidly, we have transitioned patient to full comfort measures and has started him on  a intravenous morphine infusion.   Acute renal failure: Multifactorial-suspect due to liver failure, poor oral intake. Rapidly worsening, now with hyperkalemia and metabolic acidosis. No further lab work needed as patient has been transitioned to full comfort measures and has been started on a intravenous morphine infusion.   Hyperkalemia with metabolic acidosis: Secondary to worsening renal function, comfort measures and affect-and no further lab work or treatment necessary at this time.  Ischemic cardiomyopathy with history of V-tach: Has AICD in place-have asked RN to place a magnet-we will discuss with [employer] rep to see if the device can be turned off-discussed with Dr. Luther Parody.  Hypertension: Blood pressure was soft over the past few days-now hypotensive-full comfort measures and affect.   History of atrial fibrillation: Chads 2 vascular for at least 3, no role for anticoagulation in this setting.  Escherichia coli UTI: Completed course of antibiotics, no longer on any IV antibiotics.  History of metastatic neuroendocrine tumor: Seen by oncology-recommendations are supportive care and transition to hospice care on discharge. Initially home hospice was contemplated, but quit rapid deterioration-patient may pass while inpatient, if not residential hospice will be required. Will reassess tomorrow.  Palliative care: Unfortunate 70 year old male with metastatic neuroendocrine tumor-now with rapid onset of acute liver failure and acute renal failure. Continues to deteriorate rapidly, DO NOT RESUSCITATE in place, after speaking with family we have transitioned to full comfort measures. Family aware very poor overall prognoses and likelihood of that in the next few hours to days. Goals are for full comfort measures, no lab work or any further monitoring necessary at this time.  DVT Prophylaxis: SCD's  Code Status: DNR  Family Communication: Daughters  at bedside  Disposition Plan: Remain  inpatient-if patient survives another day or so, may need residential hospice  Antimicrobial agents: IV Rocephin 7/4>>7/4  Procedures: None  CONSULTS:  hematology/oncology  Time spent: 25 minutes-Greater than 50% of this time was spent in counseling, explanation of diagnosis, planning of further management, and coordination of care.  MEDICATIONS: Anti-infectives    Start     Dose/Rate Route Frequency Ordered Stop   09/19/15 1600  cefTRIAXone (ROCEPHIN) 1 g in dextrose 5 % 50 mL IVPB     1 g 100 mL/hr over 30 Minutes Intravenous  Once 09/19/15 1033 09/19/15 1801   09/19/15 0800  ciprofloxacin (CIPRO) tablet 250 mg  Status:  Discontinued     250 mg Oral 2 times daily 09/19/15 0727 09/19/15 1033   09/16/15 1600  rifaximin (XIFAXAN) tablet 200 mg  Status:  Discontinued     200 mg Oral 3 times daily 09/16/15 1238 Sep 25, 2015 0718   09/08/2015 1500  cefTRIAXone (ROCEPHIN) 2 g in dextrose 5 % 50 mL IVPB  Status:  Discontinued     2 g 100 mL/hr over 30 Minutes Intravenous Every 24 hours 09/12/2015 1440 09/19/15 0727      Scheduled Meds:   Continuous Infusions: . morphine 2 mg/hr (25-Sep-2015 1005)   PRN Meds:.antiseptic oral rinse, glycopyrrolate **OR** glycopyrrolate **OR** glycopyrrolate, haloperidol **OR** haloperidol **OR** haloperidol lactate, LORazepam, morphine injection, morphine CONCENTRATE, ondansetron **OR** ondansetron (ZOFRAN) IV, polyvinyl alcohol, sodium chloride flush   PHYSICAL EXAM: Vital signs: Filed Vitals:   09/20/15 0613 09/20/15 1329 09/20/15 2211 September 25, 2015 0500  BP: 86/59 89/69 93/55  73/50  Pulse: 85 79 81 84  Temp: 97.8 F (36.6 C) 97.3 F (36.3 C) 97.9 F (36.6 C) 97.7 F (36.5 C)  TempSrc: Oral Oral Axillary Axillary  Resp: 18 18 20 24   Height:      Weight: 78.4 kg (172 lb 13.5 oz)   79.2 kg (174 lb 9.7 oz)  SpO2: 98% 95% 95% 98%   Filed Weights   09/19/15 0404 09/20/15 0613 2015/09/25 0500  Weight: 76.4 kg (168 lb 6.9 oz) 78.4 kg (172 lb 13.5 oz) 79.2  kg (174 lb 9.7 oz)   Body mass index is 24.36 kg/(m^2).   Gen Exam: Awake, But lethargic and very sleepy. In mildTo moderate distress-much more tachypneic. Neck: Supple Chest: B/L Clear anteriorly. CVS: S1 S2 Regular, no murmurs.  Abdomen: soft, BS +, distended.  Extremities:++ edema, lower extremities warm to touch. Neurologic: Non Focal-but with generalized weakness    LABORATORY DATA: CBC:  Recent Labs Lab 09/14/2015 1123 09/16/15 0522 09/17/15 0352  WBC 5.4 6.1 7.6  NEUTROABS  --   --  5.7  HGB 13.4 12.4* 12.3*  HCT 35.1* 32.3* 32.5*  MCV 85.0 85.9 86.4  PLT 109* 124* 94*    Basic Metabolic Panel:  Recent Labs Lab 09/16/15 0522 09/17/15 0352 09/19/15 0415 09/20/15 0515 09-25-15 0410  NA 137 136 136 133* 136  K 4.4 4.2 4.1 5.7* 6.5*  CL 103 103 103 100* 102  CO2 25 23 22  16* 11*  GLUCOSE 111* 106* 104* 102* 31*  BUN 37* 36* 47* 51* 63*  CREATININE 1.91* 1.72* 1.71* 1.73* 3.47*  CALCIUM 8.6* 8.7* 8.8* 8.9 9.1    GFR: Estimated Creatinine Clearance: 22 mL/min (by C-G formula based on Cr of 3.47).  Liver Function Tests:  Recent Labs Lab 09/16/15 0522 09/17/15 0352 09/19/15 0415 09/20/15 0515 Sep 25, 2015 0410  AST 64* 73* 72* 126* 226*  ALT 42 40 40  45 82*  ALKPHOS 237* 243* 233* 222* 246*  BILITOT 14.9* 17.2* 23.5* 26.0* 29.3*  PROT 4.5* 4.6* 4.7* 4.2* 5.1*  ALBUMIN 1.6* 1.7* 1.8* 1.9* 2.0*   No results for input(s): LIPASE, AMYLASE in the last 168 hours.  Recent Labs Lab 09/16/15 0527 09/18/15 0750  AMMONIA 81* 51*    Coagulation Profile:  Recent Labs Lab 08/24/2015 1209 09/16/15 0522 09/17/15 0703 09/18/15 0405 09/19/15 0415  INR 4.55* 5.23* 2.26* 2.05* 2.02*    Cardiac Enzymes: No results for input(s): CKTOTAL, CKMB, CKMBINDEX, TROPONINI in the last 168 hours.  BNP (last 3 results) No results for input(s): PROBNP in the last 8760 hours.  HbA1C: No results for input(s): HGBA1C in the last 72 hours.  CBG:  Recent Labs Lab  09/10/2015 1057 10-10-2015 0526  GLUCAP 98 93    Lipid Profile: No results for input(s): CHOL, HDL, LDLCALC, TRIG, CHOLHDL, LDLDIRECT in the last 72 hours.  Thyroid Function Tests: No results for input(s): TSH, T4TOTAL, FREET4, T3FREE, THYROIDAB in the last 72 hours.  Anemia Panel: No results for input(s): VITAMINB12, FOLATE, FERRITIN, TIBC, IRON, RETICCTPCT in the last 72 hours.  Urine analysis:    Component Value Date/Time   COLORURINE ORANGE* 08/29/2015 1214   APPEARANCEUR CLOUDY* 09/02/2015 1214   APPEARANCEUR Turbid* 05/24/2015 1025   LABSPEC 1.023 09/09/2015 1214   PHURINE 5.5 09/10/2015 1214   GLUCOSEU NEGATIVE 09/10/2015 1214   HGBUR SMALL* 08/23/2015 1214   BILIRUBINUR LARGE* 09/08/2015 1214   BILIRUBINUR Positive* 05/24/2015 1025   [place] 08/28/2015 1214   PROTEINUR NEGATIVE 08/20/2015 1214   PROTEINUR 1+* 05/24/2015 1025   UROBILINOGEN 0.2 06/29/2013 1016   NITRITE POSITIVE* 08/23/2015 1214   NITRITE Positive* 05/24/2015 1025   LEUKOCYTESUR SMALL* 08/31/2015 1214   LEUKOCYTESUR 1+* 05/24/2015 1025    Sepsis Labs: Lactic Acid, Venous    Component Value Date/Time   LATICACIDVEN 4.2* 09/16/2015 0900    MICROBIOLOGY: Recent Results (from the past 240 hour(s))  Urine culture     Status: Abnormal   Collection Time: 08/23/2015  1:32 PM  Result Value Ref Range Status   Specimen Description URINE, RANDOM  Final   Special Requests Immunocompromised  Final   Culture 20,000 COLONIES/mL ESCHERICHIA COLI (A)  Final   Report Status 09/17/2015 FINAL  Final   Organism ID, Bacteria ESCHERICHIA COLI (A)  Final      Susceptibility   Escherichia coli - MIC*    AMPICILLIN >=32 RESISTANT Resistant     CEFAZOLIN <=4 SENSITIVE Sensitive     CEFTRIAXONE <=1 SENSITIVE Sensitive     CIPROFLOXACIN <=0.25 SENSITIVE Sensitive     GENTAMICIN <=1 SENSITIVE Sensitive     IMIPENEM <=0.25 SENSITIVE Sensitive     NITROFURANTOIN <=16 SENSITIVE Sensitive     TRIMETH/SULFA  <=20 SENSITIVE Sensitive     AMPICILLIN/SULBACTAM >=32 RESISTANT Resistant     PIP/TAZO <=4 SENSITIVE Sensitive     * 20,000 COLONIES/mL ESCHERICHIA COLI    RADIOLOGY STUDIES/RESULTS: Ct Abdomen Pelvis W Contrast  08/25/2015  CLINICAL DATA:  71 year old male with history of metastatic neuroendocrine carcinoma with liver metastasis status post embolization and chemotherapy. Followup study. Weight loss. EXAM: CT ABDOMEN AND PELVIS WITH CONTRAST TECHNIQUE: Multidetector CT imaging of the abdomen and pelvis was performed using the standard protocol following bolus administration of intravenous contrast. CONTRAST:  178mL ISOVUE-300 IOPAMIDOL (ISOVUE-300) INJECTION 61% COMPARISON:  Multiple priors, most recently CT the abdomen and pelvis 05/31/2015. FINDINGS: Lower chest: Trace left pleural effusion lying dependently. Atherosclerotic  calcifications in the left main, left anterior descending, left circumflex and right coronary arteries. Pacemaker leads terminating in the right atrium, right ventricular apex and overlying the anterior wall of the left ventricle via the coronary sinus and coronary veins. Elevation of the right hemidiaphragm related to severe hepatomegaly. Hepatobiliary: Liver is incompletely visualized, but is severely enlarged, estimated to measure approximately 29 cm craniocaudal. There are innumerable hypervascular areas within the liver, which are difficult to directly compare with the prior study secondary to differences in bolus timing in the indistinct nature of the multiple lesions. However, subjectively there is an increased number, size and confluence of these numerous hypervascular areas, strongly suggestive of progression of disease in the liver. The largest confluence of lesions is in segment 4 adjacent to the falciform ligament. There are also multiple well-defined low-attenuation lesions within the liver which likely represent cysts, although some of these could represent treated  metastatic lesions with areas of internal necrosis. No intra or extrahepatic biliary ductal dilatation. Gallbladder is nearly completely contracted. Tiny gallstone lying dependently in the lumen of the gallbladder. Embolization coil noted in the porta hepatis. Pancreas: No pancreatic mass. No pancreatic ductal dilatation. No pancreatic or peripancreatic fluid or inflammatory changes. Spleen: Numerous calcified granulomas in the spleen. Adrenals/Urinary Tract: Bilateral adrenal glands are normal in appearance. Multifocal areas of cortical thinning in the kidneys bilaterally (left greater than right), likely secondary to prior infection/infarction. No suspicious renal lesions. No hydroureteronephrosis. Urinary bladder is nearly decompressed, but otherwise unremarkable in appearance. Stomach/Bowel: Stomach is nearly completely decompressed. No pathologic dilatation of small bowel or colon. Normal appendix. Vascular/Lymphatic: Atherosclerosis throughout the abdominal and pelvic vasculature, without evidence of aneurysm or dissection. No definite lymphadenopathy noted in the abdomen or pelvis. Reproductive: Prostate gland and seminal vesicles are unremarkable in appearance. Other: Small moderate volume of ascites.  No pneumoperitoneum. Musculoskeletal: There are no aggressive appearing lytic or blastic lesions noted in the visualized portions of the skeleton. IMPRESSION: 1. Findings on today's examination again suggests progression of metastatic disease in the liver, as evidenced by increased number, size and confluence of hypervascular areas. 2. Small to moderate volume of ascites again noted. 3. Trace left pleural effusion, new compared to the prior study. 4. Additional incidental findings, as above. Electronically Signed   By: Vinnie Langton M.D.   On: 08/25/2015 10:25   US Abdomen Limited  09/20/2015  CLINICAL DATA:  Evaluate ascites for possible paracentesis. EXAM: LIMITED ABDOMEN ULTRASOUND FOR ASCITES  TECHNIQUE: Limited ultrasound survey for ascites was performed in all four abdominal quadrants. COMPARISON:  09/17/2015 FINDINGS: There is small amount of ascites predominantly collects in the left lower quadrant. This is similar to the earlier study. This was not felt sufficient to allow for safe paracentesis. Numerous hypoechoic liver lesions are again noted. IMPRESSION: 1. Small amount of ascites felt insufficient to allow for safe paracentesis. No significant change from the previous limited abdominal ultrasound. Electronically Signed   By: Lajean Manes M.D.   On: 09/20/2015 11:03   US Abdomen Limited  09/17/2015  CLINICAL DATA:  History of metastatic neuroendocrine tumor to the liver. Limited ultrasound done prior to possible paracentesis. EXAM: LIMITED ABDOMEN ULTRASOUND FOR ASCITES TECHNIQUE: Limited ultrasound survey for ascites was performed in all four abdominal quadrants. COMPARISON:  None. FINDINGS: Limited ultrasound survey for ascites was performed in all four abdominal quadrants. There is a minimal amount of peritoneal fluid. There is no significant pocket of fluid to allow for a safe approach for paracentesis IMPRESSION: Minimal  ascites. Read by:  Gareth Eagle, PA-C Electronically Signed   By: Lucrezia Europe M.D.   On: 09/17/2015 11:40   Dg Chest Port 1 View  09/20/2015  CLINICAL DATA:  Malignant ascites.  Liver failure. EXAM: PORTABLE CHEST 1 VIEW COMPARISON:  September 16, 2015 FINDINGS: There is persistent elevation of the right hemidiaphragm. There is focal airspace consolidation in the right base. Lungs elsewhere clear. Heart is upper normal in size with pulmonary vascularity within normal limits. Central catheter tip is in the superior vena cava. Pacemaker leads are attached to the right atrium, right ventricle, and left ventricle. There is atherosclerotic calcification in the aorta. There is a calcified azygos region lymph node. No frank adenopathy is evident radiographically. IMPRESSION: Airspace  consolidation consistent with pneumonia right base. Elevation right hemidiaphragm. Lungs elsewhere clear. There is aortic atherosclerosis. Stable cardiac silhouette. Calcified azygos lymph node consistent with prior granulomatous disease. Electronically Signed   By: Lowella Grip III M.D.   On: 09/20/2015 08:05   Dg Chest Port 1 View  09/16/2015  CLINICAL DATA:  Status post central line placement EXAM: PORTABLE CHEST 1 VIEW COMPARISON:  08/26/2015 FINDINGS: Pacing device is again identified and stable. Cardiac shadow is stable. Right basilar infiltrate is again seen and stable. A new right PICC line is noted at cavoatrial junction in satisfactory position. IMPRESSION: Status post right PICC line placement in satisfactory position. The remainder of the exam is stable from the previous day. Electronically Signed   By: Inez Catalina M.D.   On: 09/16/2015 09:36   Dg Chest Portable 1 View  09/13/2015  CLINICAL DATA:  Increasing shortness of breath over the last 3 weeks, history of known liver carcinoma EXAM: PORTABLE CHEST 1 VIEW COMPARISON:  06/01/2014 FINDINGS: Cardiac shadow is stable. A defibrillator is again noted. Elevation of the right hemidiaphragm is noted with right basilar atelectatic changes. Small effusion cannot be totally excluded. No bony abnormality is noted. IMPRESSION: Right basilar atelectasis/infiltrate with likely small effusion. Electronically Signed   By: Inez Catalina M.D.   On: 08/18/2015 12:09     LOS: 5 days   Oren Binet, MD  [facility] Pager:[phone]  If 7PM-7AM, please contact night-coverage [email or web address] Password [facility] September 28, 2015, 11:37 AM

## 2015-10-17 DEATH — deceased

## 2016-08-25 IMAGING — PT NM PET TUM IMG RESTAG (PS) SKULL BASE T - THIGH
8 series · 19 of 25 positions shown · non-contrast
Comparison: 02/14/2015 CT abdomen.

CLINICAL DATA: Subsequent treatment strategy for metastatic
neuroendocrine carcinoma with liver metastases, status post atrium
90 . embolization of the left and right liver lobes in [REDACTED] and
November 2014.

EXAM:
NUCLEAR MEDICINE PET SKULL BASE TO THIGH
TECHNIQUE: 8.8 mCi F-18 FDG was injected intravenously. Full-ring PET imaging
was performed from the skull base to thigh after the radiotracer. CT
data was obtained and used for attenuation correction and anatomic
localization.
FASTING BLOOD GLUCOSE:  Value: 90 mg/dl

[Series 4: pet sk_thigh ac · axial · 5.0mm · 4.07mm/px · z∈[-1210,-390]mm · 3 of 206 slices shown]
[im 1/206]
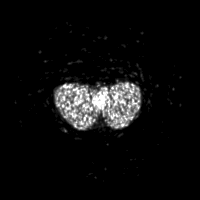
[im 69/206]
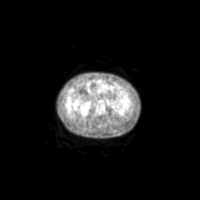
[im 206/206]
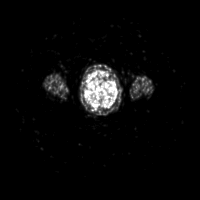

[Series 5: ct sk_thigh 5.0 b31f · axial · 5.0mm · 0.98mm/px · z∈[-1210,-390]mm · 4 of 206 slices shown]
[im 1/206]
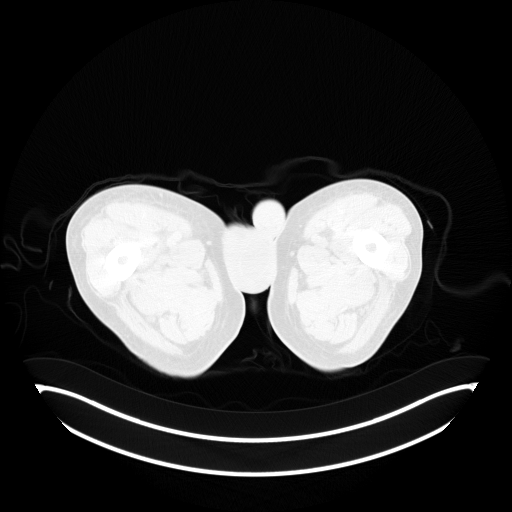
[im 52/206]
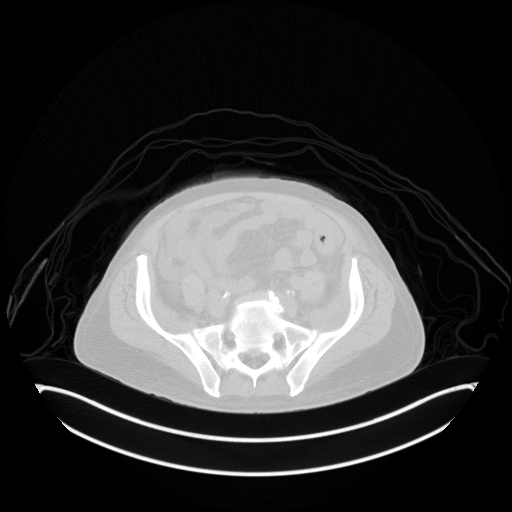
[im 154/206]
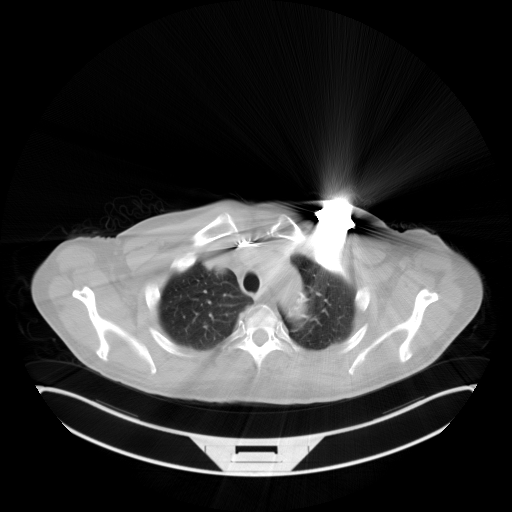
[im 206/206  brain]
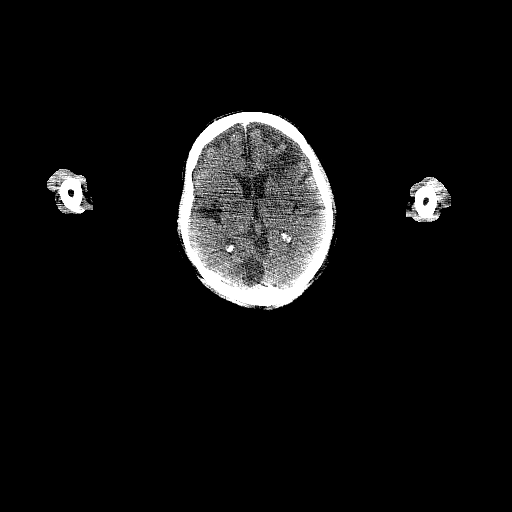

[Series 7: ct sk_thigh 5.0 b70f lung_bone · axial · 5.0mm · 0.67mm/px · 1 of 66 slices shown]
[im 1/66  bone]
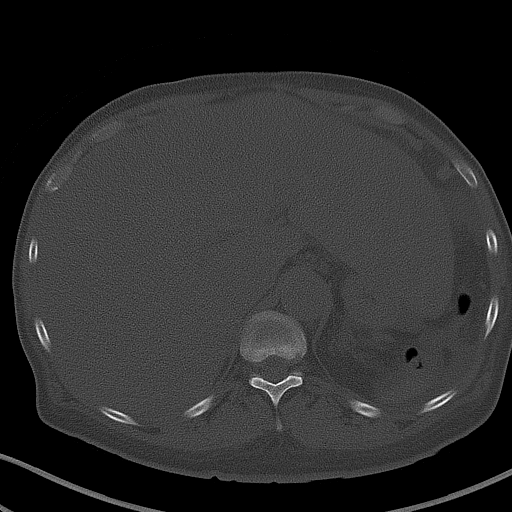

[Series 9: pet sk_thigh nac · axial · 5.0mm · 4.07mm/px · z∈[-1210,-390]mm · 4 of 206 slices shown]
[im 1/206]
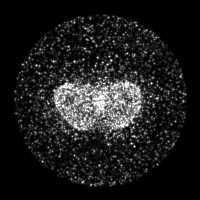
[im 52/206]
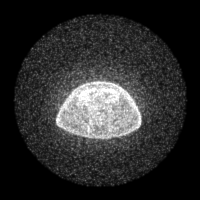
[im 103/206]
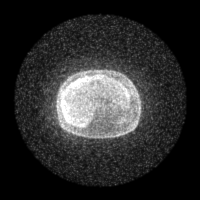
[im 206/206]
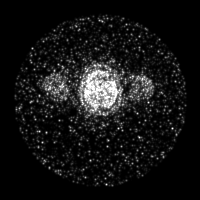

[Series 604: mip collection<mip range> · coronal · 1.70mm/px · 1 of 32 slices shown]
[im 1/32]
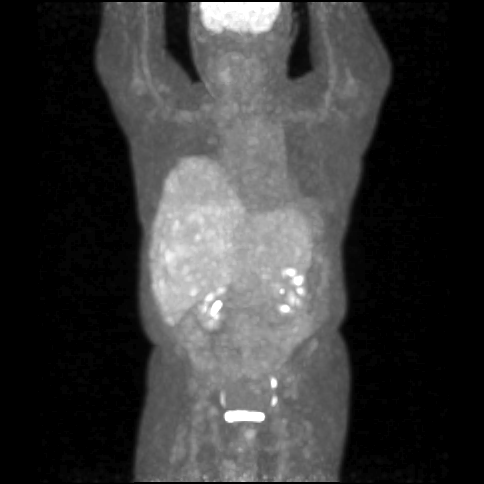

[Series 605: range-ct sk_thigh 5.0 (id)<alpha range> · 1 of 69 slices shown (1 of 2)]
[im 1/69]
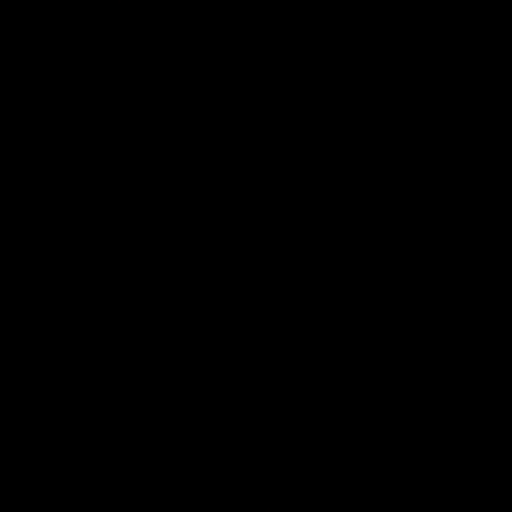

[Series 606: range-ct sk_thigh 5.0 (id)<alpha range> · 4 of 201 slices shown (2 of 2)]
[im 1/201]
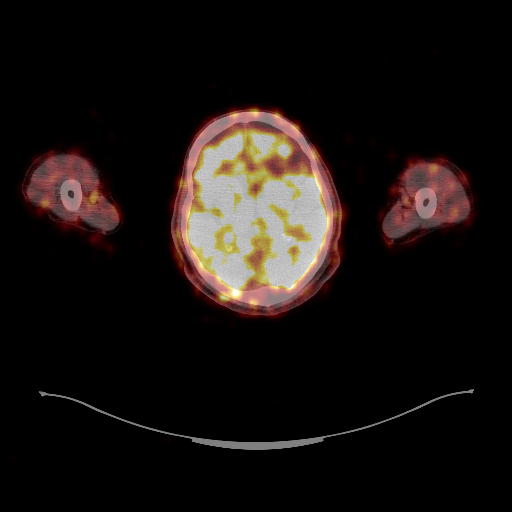
[im 51/201]
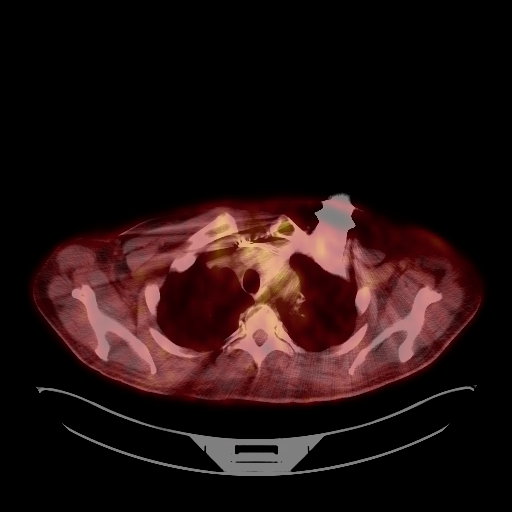
[im 101/201]
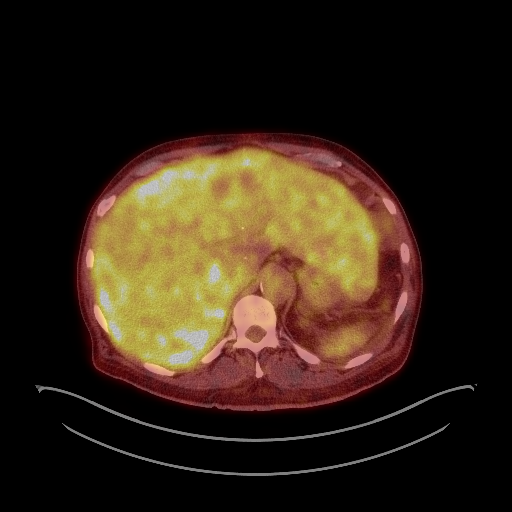
[im 201/201]
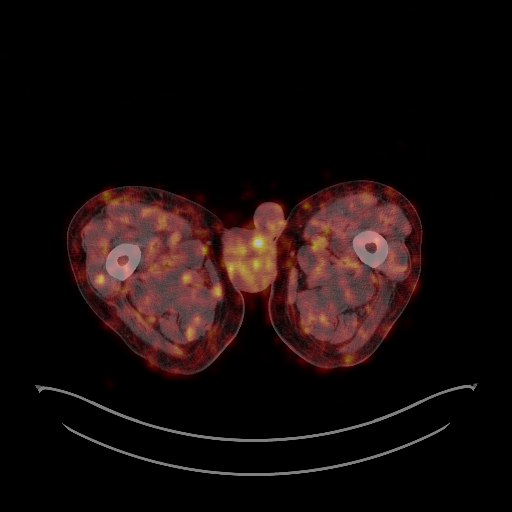

[Series 1032: results mm oncology reading · 0.47mm/px · 1 of 9 slices shown]
[im 1/9]
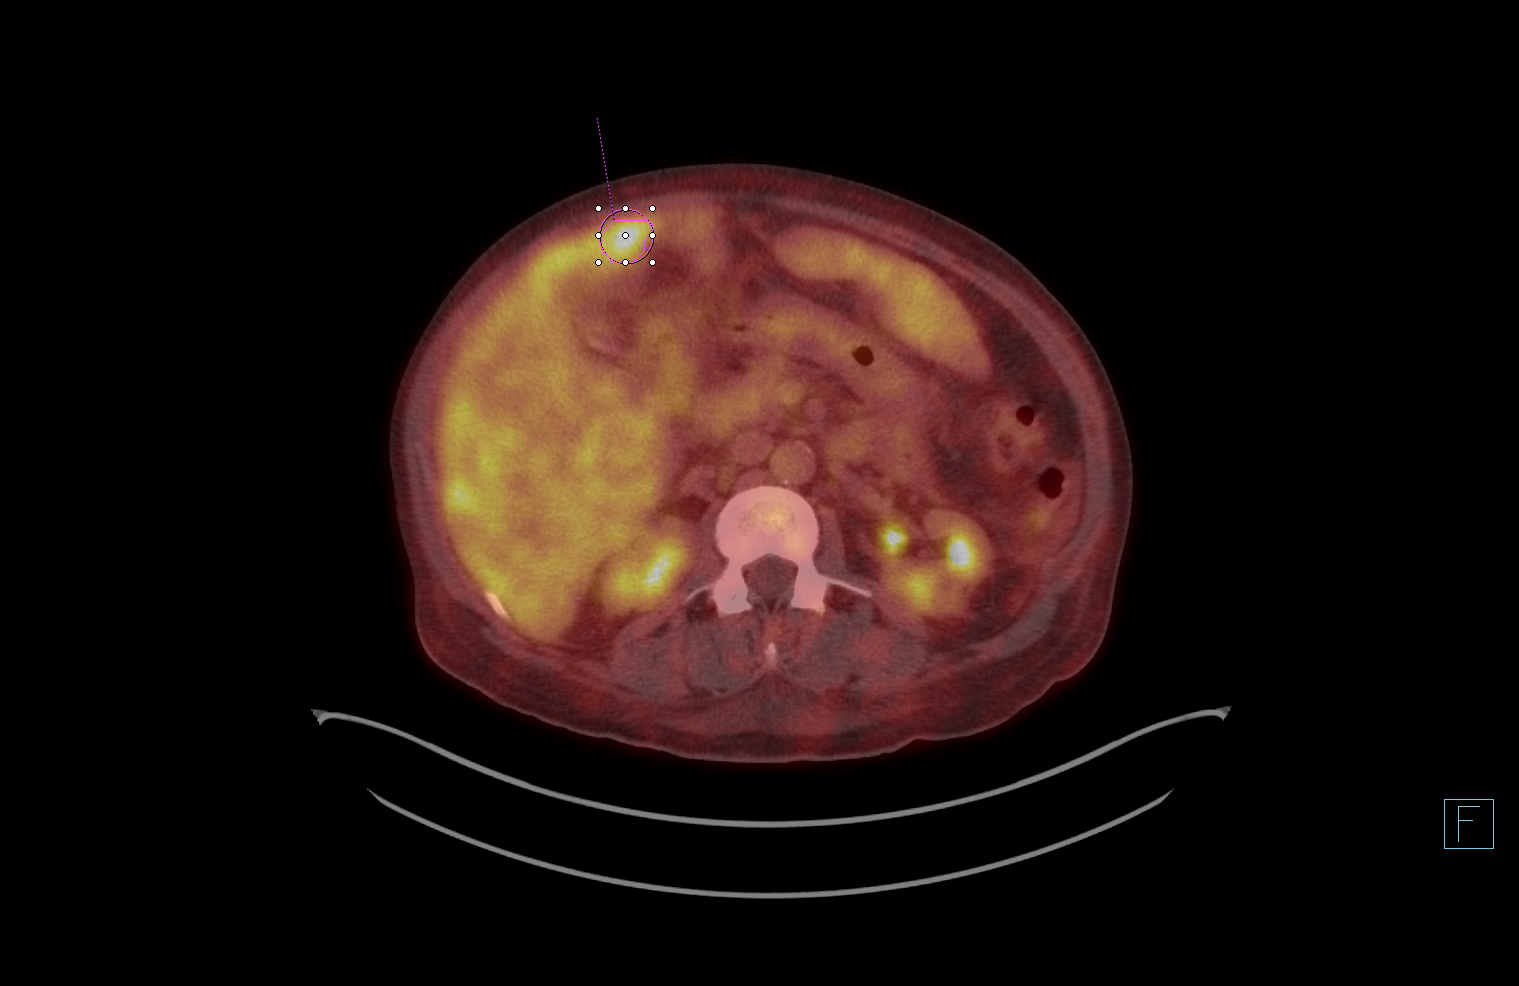

[19 of 25 positions shown; findings below may reference images not displayed]

FINDINGS: NECK

No hypermetabolic lymph nodes in the neck. There is a partially
visualized focus of encephalomalacia in the left frontal lobe. There
is a nonspecific partially visualized low-attenuation focus in the
medial right occipital lobe region (series 5/image 1). Non
hypermetabolic 1.6 cm hypodense right thyroid lobe nodule.

CHEST

No hypermetabolic axillary, mediastinal or hilar nodes. Stable mild
cardiomegaly. Three lead left subclavian ICD is noted with lead tips
in the right atrium, right ventricular apex and coronary sinus.
Coronary atherosclerosis. Coarsely calcified mediastinal and right
hilar granulomatous lymph nodes. Trace right pleural effusion.
Calcified granuloma in the posterior right upper lobe. Elevated
right hemidiaphragm with complete right middle lobe and subsegmental
right lower lobe atelectasis. No acute consolidative airspace
disease or significant pulmonary nodules.

ABDOMEN/PELVIS

There is hepatomegaly, which appears mildly increased (craniocaudal
liver length 29.3 cm, increased from 27.4 cm on 02/14/2015). Liver
surface is diffusely mildly irregular, unchanged. There are at least
9 hypermetabolic liver masses scattered throughout the liver, with
representative lesions as follows:

- subcapsular segment 6 right liver lobe 4.3 cm mass with max SUV
8.0 (series 5/image 111)

- anterior subcapsular segment 4A left liver lobe mass with max SUV
7.6 (series 5/image 100), with no discrete measurable mass on the
noncontrast CT images

- far inferior subcapsular segment 4B left liver lobe 4.5 cm mass
with max SUV 7.9 (series 5/image 126)

No abnormal hypermetabolic activity within the pancreas, adrenal
glands, or spleen. No hypermetabolic lymph nodes in the abdomen or
pelvis. Cholelithiasis. Granulomatous calcifications in the normal
size spleen. Small volume ascites. Atherosclerotic nonaneurysmal
abdominal aorta.

SKELETON

No focal hypermetabolic activity to suggest skeletal metastasis.
IMPRESSION: 1. Hepatomegaly with hypermetabolic liver metastases as described.
Liver size appears mildly increased since 02/14/2015.
[DATE]. Small volume ascites.  No splenomegaly.
3. Indeterminate partially visualized low-attenuation focus in the
medial right occipital lobe. Encephalomalacia in the left frontal
lobe. Consider correlation with head CT or brain MRI with and
without intravenous contrast.
4. Additional findings include non-hypermetabolic 1.6 cm right
thyroid lobe nodule, cardiomegaly, coronary atherosclerosis, trace
right pleural effusion and cholelithiasis.
